# Patient Record
Sex: Female | Born: 1940 | Race: White | Hispanic: No | Marital: Single | Smoking: Former smoker
Health system: Southern US, Community
[De-identification: ages and names within clinical notes are randomized; demographics above are authoritative.]

## PROBLEM LIST (undated history)

## (undated) DIAGNOSIS — C562 Malignant neoplasm of left ovary: Secondary | ICD-10-CM

## (undated) DIAGNOSIS — E785 Hyperlipidemia, unspecified: Secondary | ICD-10-CM

## (undated) DIAGNOSIS — J9610 Chronic respiratory failure, unspecified whether with hypoxia or hypercapnia: Secondary | ICD-10-CM

## (undated) DIAGNOSIS — M810 Age-related osteoporosis without current pathological fracture: Secondary | ICD-10-CM

## (undated) DIAGNOSIS — D638 Anemia in other chronic diseases classified elsewhere: Secondary | ICD-10-CM

## (undated) DIAGNOSIS — I10 Essential (primary) hypertension: Secondary | ICD-10-CM

## (undated) DIAGNOSIS — J441 Chronic obstructive pulmonary disease with (acute) exacerbation: Secondary | ICD-10-CM

## (undated) DIAGNOSIS — J439 Emphysema, unspecified: Secondary | ICD-10-CM

## (undated) DIAGNOSIS — E119 Type 2 diabetes mellitus without complications: Secondary | ICD-10-CM

## (undated) DIAGNOSIS — Z5189 Encounter for other specified aftercare: Secondary | ICD-10-CM

## (undated) DIAGNOSIS — K56609 Unspecified intestinal obstruction, unspecified as to partial versus complete obstruction: Secondary | ICD-10-CM

## (undated) DIAGNOSIS — R6889 Other general symptoms and signs: Secondary | ICD-10-CM

## (undated) HISTORY — DX: Anemia in other chronic diseases classified elsewhere: D63.8

## (undated) HISTORY — DX: Type 2 diabetes mellitus without complications: E11.9

## (undated) HISTORY — DX: Encounter for other specified aftercare: Z51.89

## (undated) HISTORY — DX: Age-related osteoporosis without current pathological fracture: M81.0

## (undated) HISTORY — PX: ABDOMINAL HYSTERECTOMY: SHX81

## (undated) HISTORY — DX: Emphysema, unspecified: J43.9

## (undated) HISTORY — DX: Chronic obstructive pulmonary disease with (acute) exacerbation: J44.1

## (undated) HISTORY — DX: Other general symptoms and signs: R68.89

---

## 1993-05-16 ENCOUNTER — Encounter: Payer: Self-pay | Admitting: Pulmonary Disease

## 1998-04-13 ENCOUNTER — Other Ambulatory Visit: Admission: RE | Admit: 1998-04-13 | Discharge: 1998-04-13 | Payer: Self-pay | Admitting: Obstetrics and Gynecology

## 1998-04-13 ENCOUNTER — Ambulatory Visit (HOSPITAL_COMMUNITY): Admission: RE | Admit: 1998-04-13 | Discharge: 1998-04-13 | Payer: Self-pay | Admitting: Obstetrics and Gynecology

## 1999-04-11 ENCOUNTER — Encounter: Payer: Self-pay | Admitting: Obstetrics and Gynecology

## 1999-04-11 ENCOUNTER — Ambulatory Visit (HOSPITAL_COMMUNITY): Admission: RE | Admit: 1999-04-11 | Discharge: 1999-04-11 | Payer: Self-pay | Admitting: Obstetrics and Gynecology

## 1999-04-18 ENCOUNTER — Other Ambulatory Visit: Admission: RE | Admit: 1999-04-18 | Discharge: 1999-04-18 | Payer: Self-pay | Admitting: Obstetrics and Gynecology

## 2000-04-10 ENCOUNTER — Encounter: Payer: Self-pay | Admitting: Obstetrics and Gynecology

## 2000-04-10 ENCOUNTER — Ambulatory Visit (HOSPITAL_COMMUNITY): Admission: RE | Admit: 2000-04-10 | Discharge: 2000-04-10 | Payer: Self-pay | Admitting: Obstetrics and Gynecology

## 2000-04-18 ENCOUNTER — Other Ambulatory Visit: Admission: RE | Admit: 2000-04-18 | Discharge: 2000-04-18 | Payer: Self-pay | Admitting: Obstetrics and Gynecology

## 2000-04-23 ENCOUNTER — Encounter: Payer: Self-pay | Admitting: Family Medicine

## 2000-04-23 ENCOUNTER — Encounter: Admission: RE | Admit: 2000-04-23 | Discharge: 2000-04-23 | Payer: Self-pay | Admitting: Family Medicine

## 2000-05-15 ENCOUNTER — Encounter: Payer: Self-pay | Admitting: Pulmonary Disease

## 2000-06-14 ENCOUNTER — Encounter: Payer: Self-pay | Admitting: Pulmonary Disease

## 2000-09-24 ENCOUNTER — Ambulatory Visit (HOSPITAL_COMMUNITY): Admission: RE | Admit: 2000-09-24 | Discharge: 2000-09-24 | Payer: Self-pay | Admitting: Family Medicine

## 2000-11-09 ENCOUNTER — Encounter: Payer: Self-pay | Admitting: Emergency Medicine

## 2000-11-09 ENCOUNTER — Inpatient Hospital Stay (HOSPITAL_COMMUNITY): Admission: EM | Admit: 2000-11-09 | Discharge: 2000-11-12 | Payer: Self-pay | Admitting: Emergency Medicine

## 2000-11-10 ENCOUNTER — Encounter: Payer: Self-pay | Admitting: Surgery

## 2000-11-11 ENCOUNTER — Encounter: Payer: Self-pay | Admitting: Surgery

## 2000-11-12 ENCOUNTER — Encounter: Payer: Self-pay | Admitting: Surgery

## 2000-11-20 ENCOUNTER — Encounter: Payer: Self-pay | Admitting: Surgery

## 2000-11-20 ENCOUNTER — Encounter: Admission: RE | Admit: 2000-11-20 | Discharge: 2000-11-20 | Payer: Self-pay | Admitting: Surgery

## 2001-04-22 ENCOUNTER — Other Ambulatory Visit: Admission: RE | Admit: 2001-04-22 | Discharge: 2001-04-22 | Payer: Self-pay | Admitting: Obstetrics and Gynecology

## 2001-04-24 ENCOUNTER — Encounter: Admission: RE | Admit: 2001-04-24 | Discharge: 2001-04-24 | Payer: Self-pay | Admitting: Family Medicine

## 2001-04-24 ENCOUNTER — Encounter: Payer: Self-pay | Admitting: Family Medicine

## 2002-04-07 ENCOUNTER — Ambulatory Visit (HOSPITAL_COMMUNITY): Admission: RE | Admit: 2002-04-07 | Discharge: 2002-04-07 | Payer: Self-pay | Admitting: Obstetrics and Gynecology

## 2002-04-07 ENCOUNTER — Encounter: Payer: Self-pay | Admitting: Obstetrics and Gynecology

## 2002-04-29 ENCOUNTER — Encounter: Payer: Self-pay | Admitting: Family Medicine

## 2002-04-29 ENCOUNTER — Encounter: Admission: RE | Admit: 2002-04-29 | Discharge: 2002-04-29 | Payer: Self-pay | Admitting: Family Medicine

## 2003-05-01 ENCOUNTER — Encounter: Payer: Self-pay | Admitting: Obstetrics and Gynecology

## 2003-05-01 ENCOUNTER — Encounter: Admission: RE | Admit: 2003-05-01 | Discharge: 2003-05-01 | Payer: Self-pay | Admitting: Obstetrics and Gynecology

## 2004-01-02 ENCOUNTER — Inpatient Hospital Stay (HOSPITAL_COMMUNITY): Admission: EM | Admit: 2004-01-02 | Discharge: 2004-01-12 | Payer: Self-pay | Admitting: Emergency Medicine

## 2004-02-03 ENCOUNTER — Ambulatory Visit (HOSPITAL_COMMUNITY): Admission: RE | Admit: 2004-02-03 | Discharge: 2004-02-03 | Payer: Self-pay | Admitting: General Surgery

## 2004-04-19 ENCOUNTER — Ambulatory Visit (HOSPITAL_COMMUNITY): Admission: RE | Admit: 2004-04-19 | Discharge: 2004-04-19 | Payer: Self-pay | Admitting: General Surgery

## 2004-05-02 ENCOUNTER — Encounter: Admission: RE | Admit: 2004-05-02 | Discharge: 2004-05-02 | Payer: Self-pay | Admitting: Family Medicine

## 2004-06-02 ENCOUNTER — Ambulatory Visit (HOSPITAL_COMMUNITY): Admission: RE | Admit: 2004-06-02 | Discharge: 2004-06-02 | Payer: Self-pay | Admitting: *Deleted

## 2004-06-02 ENCOUNTER — Encounter (INDEPENDENT_AMBULATORY_CARE_PROVIDER_SITE_OTHER): Payer: Self-pay | Admitting: *Deleted

## 2004-11-05 ENCOUNTER — Inpatient Hospital Stay (HOSPITAL_COMMUNITY): Admission: EM | Admit: 2004-11-05 | Discharge: 2004-11-08 | Payer: Self-pay | Admitting: Emergency Medicine

## 2004-11-10 ENCOUNTER — Ambulatory Visit: Payer: Self-pay | Admitting: Pulmonary Disease

## 2004-11-22 ENCOUNTER — Ambulatory Visit: Payer: Self-pay | Admitting: Pulmonary Disease

## 2005-01-25 ENCOUNTER — Ambulatory Visit: Payer: Self-pay | Admitting: Pulmonary Disease

## 2005-05-03 ENCOUNTER — Ambulatory Visit: Payer: Self-pay | Admitting: Pulmonary Disease

## 2005-05-03 ENCOUNTER — Encounter: Admission: RE | Admit: 2005-05-03 | Discharge: 2005-05-03 | Payer: Self-pay | Admitting: Family Medicine

## 2005-08-26 ENCOUNTER — Emergency Department (HOSPITAL_COMMUNITY): Admission: EM | Admit: 2005-08-26 | Discharge: 2005-08-26 | Payer: Self-pay | Admitting: Emergency Medicine

## 2005-10-26 ENCOUNTER — Ambulatory Visit: Payer: Self-pay | Admitting: Pulmonary Disease

## 2006-02-22 ENCOUNTER — Emergency Department (HOSPITAL_COMMUNITY): Admission: EM | Admit: 2006-02-22 | Discharge: 2006-02-22 | Payer: Self-pay | Admitting: Emergency Medicine

## 2006-05-04 ENCOUNTER — Encounter: Admission: RE | Admit: 2006-05-04 | Discharge: 2006-05-04 | Payer: Self-pay | Admitting: Family Medicine

## 2006-05-11 ENCOUNTER — Ambulatory Visit: Payer: Self-pay | Admitting: Pulmonary Disease

## 2006-08-21 ENCOUNTER — Ambulatory Visit: Payer: Self-pay | Admitting: Pulmonary Disease

## 2006-11-19 ENCOUNTER — Ambulatory Visit: Payer: Self-pay | Admitting: Pulmonary Disease

## 2007-05-06 ENCOUNTER — Encounter: Admission: RE | Admit: 2007-05-06 | Discharge: 2007-05-06 | Payer: Self-pay | Admitting: Family Medicine

## 2007-06-03 ENCOUNTER — Ambulatory Visit: Payer: Self-pay | Admitting: Pulmonary Disease

## 2007-10-09 ENCOUNTER — Ambulatory Visit: Payer: Self-pay | Admitting: Pulmonary Disease

## 2007-12-09 ENCOUNTER — Ambulatory Visit: Payer: Self-pay | Admitting: Pulmonary Disease

## 2008-01-15 ENCOUNTER — Telehealth (INDEPENDENT_AMBULATORY_CARE_PROVIDER_SITE_OTHER): Payer: Self-pay | Admitting: *Deleted

## 2008-01-21 ENCOUNTER — Telehealth (INDEPENDENT_AMBULATORY_CARE_PROVIDER_SITE_OTHER): Payer: Self-pay | Admitting: *Deleted

## 2008-01-22 ENCOUNTER — Ambulatory Visit: Payer: Self-pay | Admitting: Pulmonary Disease

## 2008-01-22 DIAGNOSIS — J209 Acute bronchitis, unspecified: Secondary | ICD-10-CM | POA: Insufficient documentation

## 2008-05-06 ENCOUNTER — Encounter: Admission: RE | Admit: 2008-05-06 | Discharge: 2008-05-06 | Payer: Self-pay | Admitting: Family Medicine

## 2008-08-05 ENCOUNTER — Ambulatory Visit: Payer: Self-pay | Admitting: Pulmonary Disease

## 2008-08-10 ENCOUNTER — Telehealth (INDEPENDENT_AMBULATORY_CARE_PROVIDER_SITE_OTHER): Payer: Self-pay | Admitting: *Deleted

## 2008-08-11 ENCOUNTER — Inpatient Hospital Stay (HOSPITAL_COMMUNITY): Admission: EM | Admit: 2008-08-11 | Discharge: 2008-08-16 | Payer: Self-pay | Admitting: Emergency Medicine

## 2008-08-17 ENCOUNTER — Encounter: Payer: Self-pay | Admitting: Pulmonary Disease

## 2008-08-29 ENCOUNTER — Encounter: Payer: Self-pay | Admitting: Pulmonary Disease

## 2008-08-31 ENCOUNTER — Ambulatory Visit: Payer: Self-pay | Admitting: Pulmonary Disease

## 2008-09-03 ENCOUNTER — Telehealth: Payer: Self-pay | Admitting: Pulmonary Disease

## 2008-09-16 ENCOUNTER — Ambulatory Visit: Payer: Self-pay | Admitting: Pulmonary Disease

## 2008-10-02 ENCOUNTER — Telehealth (INDEPENDENT_AMBULATORY_CARE_PROVIDER_SITE_OTHER): Payer: Self-pay | Admitting: *Deleted

## 2008-11-02 ENCOUNTER — Ambulatory Visit: Payer: Self-pay | Admitting: Pulmonary Disease

## 2008-11-03 ENCOUNTER — Telehealth (INDEPENDENT_AMBULATORY_CARE_PROVIDER_SITE_OTHER): Payer: Self-pay | Admitting: *Deleted

## 2009-02-24 ENCOUNTER — Ambulatory Visit: Payer: Self-pay | Admitting: Pulmonary Disease

## 2009-08-11 ENCOUNTER — Ambulatory Visit: Payer: Self-pay | Admitting: Pulmonary Disease

## 2009-10-11 ENCOUNTER — Telehealth (INDEPENDENT_AMBULATORY_CARE_PROVIDER_SITE_OTHER): Payer: Self-pay | Admitting: *Deleted

## 2009-12-06 ENCOUNTER — Ambulatory Visit: Payer: Self-pay | Admitting: Pulmonary Disease

## 2009-12-22 ENCOUNTER — Encounter: Payer: Self-pay | Admitting: Pulmonary Disease

## 2010-02-09 ENCOUNTER — Ambulatory Visit: Payer: Self-pay | Admitting: Pulmonary Disease

## 2010-04-30 ENCOUNTER — Encounter: Payer: Self-pay | Admitting: Pulmonary Disease

## 2010-06-16 ENCOUNTER — Ambulatory Visit: Payer: Self-pay | Admitting: Pulmonary Disease

## 2010-06-16 DIAGNOSIS — J441 Chronic obstructive pulmonary disease with (acute) exacerbation: Secondary | ICD-10-CM

## 2010-06-18 ENCOUNTER — Encounter: Payer: Self-pay | Admitting: Pulmonary Disease

## 2010-06-24 ENCOUNTER — Encounter: Payer: Self-pay | Admitting: Pulmonary Disease

## 2010-08-10 ENCOUNTER — Ambulatory Visit: Payer: Self-pay | Admitting: Pulmonary Disease

## 2010-12-06 NOTE — Letter (Signed)
Summary: CMN/[facility]  CMN/[facility]   Imported By: Lester  05/05/2010 10:57:33  _____________________________________________________________________  External Attachment:    Type:   Image     Comment:   External Document

## 2010-12-06 NOTE — Assessment & Plan Note (Signed)
Summary: rov for emphysema   Primary Provider/Referring Provider:  Catha Gosselin  CC:  pt is here for a 6 month f/u appt.  pt states breathing is unchanged from last visit.  Pt denied any new complaints.  pt would like a rx for spiriva and symbicort - 30 day supply .  History of Present Illness: the pt comes in today for f/u of her known emphysema.  She has been doing well since last visit, with no recent exacerbation or pulmonary infection.  She is staying compliant with her meds, and has been very active.  Medications Prior to Update: 1)  Anti-Oxidant   Tabs (Multiple Vitamin) .... Once Daily 2)  Spiriva Handihaler 18 Mcg  Caps (Tiotropium Bromide Monohydrate) .... Once Daily 3)  Benicar 20 Mg Tabs (Olmesartan Medoxomil) .... Take 1 Tablet By Mouth Once A Day 4)  Lipitor 80 Mg  Tabs (Atorvastatin Calcium) .... Take 1 Tablet By Mouth Once A Day 5)  Caltrate 600 1500 Mg Tabs (Calcium Carbonate) .... Take 1 Tablet By Mouth Two Times A Day 6)  Icaps Areds Formula   Tabs (Multiple Vitamins-Minerals) .... Once Daily 7)  Adult Aspirin Low Strength 81 Mg  Tbdp (Aspirin) .... Once Daily 8)  Fish Oil 1000 Mg  Caps (Omega-3 Fatty Acids) .... Take 1 Tablet By Mouth Two Times A Day 9)  Proair Hfa 108 (90 Base) Mcg/act  Aers (Albuterol Sulfate) .Marland Kitchen.. 1-2 Puffs Every 4-6 Hours As Needed 10)  Xanax 0.25 Mg Tabs (Alprazolam) .... Take 1 Tab By Mouth As Needed 11)  Albuterol Sulfate (2.5 Mg/72ml) 0.083% Nebu (Albuterol Sulfate) .... Use 1 Vial in Hhn Every 4 To 6 Hours As Needed For Sob 12)  Symbicort 160-4.5 Mcg/act Aero (Budesonide-Formoterol Fumarate) .... 2 Puffs Two Times A Day  Allergies (verified): No Known Drug Allergies  Review of Systems      See HPI  Vital Signs:  Patient profile:   70 year old female Height:      61 inches Weight:      95. pounds BMI:     18.01 O2 Sat:      97 % on Room air Temp:     97.8 degrees F oral Pulse rate:   86 / minute BP sitting:   110 / 64  (right  arm) Cuff size:   regular  Vitals Entered By: Arman Filter LPN (February 09, 4781 1:39 PM)  O2 Flow:  Room air CC: pt is here for a 6 month f/u appt.  pt states breathing is unchanged from last visit.  Pt denied any new complaints.  pt would like a rx for spiriva and symbicort - 30 day supply  Comments Medications reviewed with patient  Arman Filter LPN  February 09, 9561 1:42 PM    Physical Exam  General:  thin female in nad Lungs:  decreased bs throughout, with no wheezing or rhonchi Heart:  rrr Extremities:  no edema noted.   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  She feels her breathing is at baseline, and has not had cough or congestion.  I have asked her to stay on her current regimen, and to stay as active as possible.  Other Orders: Est. Patient Level II (13086)  Patient Instructions: 1)  no change in meds. 2)  can take zyrtec 10mg  otc at bedtime to help with allergy symptoms. 3)  followup with me  in 6mos   Prescriptions: ALBUTEROL SULFATE (2.5 MG/3ML) 0.083% NEBU (ALBUTEROL SULFATE) use 1 vial in hhn every 4 to 6 hours as needed for sob  #one box x 6   Entered and Authorized by:   Barbaraann Share MD   Signed by:   Barbaraann Share MD on 02/09/2010   Method used:   Print then Give to Patient   RxID:   [id number] SYMBICORT 160-4.5 MCG/ACT AERO (BUDESONIDE-FORMOTEROL FUMARATE) 2 puffs two times a day  #1 x 6   Entered and Authorized by:   Barbaraann Share MD   Signed by:   Barbaraann Share MD on 02/09/2010   Method used:   Print then Give to Patient   RxID:   [id number] PROAIR HFA 108 (90 BASE) MCG/ACT  AERS (ALBUTEROL SULFATE) 1-2 puffs every 4-6 hours as needed  #1 x 6   Entered and Authorized by:   Barbaraann Share MD   Signed by:   Barbaraann Share MD on 02/09/2010   Method used:   Print then Give to Patient   RxID:   [id number] SPIRIVA HANDIHALER 18 MCG   CAPS (TIOTROPIUM BROMIDE MONOHYDRATE) once daily  #30 x 6   Entered and Authorized by:   Barbaraann Share MD   Signed by:   Barbaraann Share MD on 02/09/2010   Method used:   Print then Give to Patient   RxID:   [id number]

## 2010-12-06 NOTE — Letter (Signed)
Summary: CMN for Oxygen/[facility]  CMN for Oxygen/[facility]   Imported By: Sherian Rein 08/03/2010 11:59:45  _____________________________________________________________________  External Attachment:    Type:   Image     Comment:   External Document

## 2010-12-06 NOTE — Assessment & Plan Note (Signed)
Summary: f/u o2 re- cert per pt ///kp   Primary Provider/Referring Provider:  Catha Gosselin  CC:  Pt is here for a f/u appt.  Pt states [facility] informed her to make this appt to get re-cert on her o2.  Pt states no changes in breathing.  Pt denied any new complaints. .  History of Present Illness: no visit.  Pt scheduled [facility] by [facility] for oxgyen recert.  Medications Prior to Update: 1)  Anti-Oxidant   Tabs (Multiple Vitamin) .... Once Daily 2)  Spiriva Handihaler 18 Mcg  Caps (Tiotropium Bromide Monohydrate) .... Once Daily 3)  Benicar 20 Mg Tabs (Olmesartan Medoxomil) .... Take 1 Tablet By Mouth Once A Day 4)  Lipitor 80 Mg  Tabs (Atorvastatin Calcium) .... Take 1 Tablet By Mouth Once A Day 5)  Caltrate 600 1500 Mg Tabs (Calcium Carbonate) .... Take 1 Tablet By Mouth Two Times A Day 6)  Icaps Areds Formula   Tabs (Multiple Vitamins-Minerals) .... Once Daily 7)  Adult Aspirin Low Strength 81 Mg  Tbdp (Aspirin) .... Once Daily 8)  Fish Oil 1000 Mg  Caps (Omega-3 Fatty Acids) .... Take 1 Tablet By Mouth Two Times A Day 9)  Proair Hfa 108 (90 Base) Mcg/act  Aers (Albuterol Sulfate) .Marland Kitchen.. 1-2 Puffs Every 4-6 Hours As Needed 10)  Xanax 0.25 Mg Tabs (Alprazolam) .... Take 1 Tab By Mouth As Needed 11)  Albuterol Sulfate (2.5 Mg/2ml) 0.083% Nebu (Albuterol Sulfate) .... Use 1 Vial in Hhn Every 4 To 6 Hours As Needed For Sob 12)  Symbicort 160-4.5 Mcg/act Aero (Budesonide-Formoterol Fumarate) .... 2 Puffs Two Times A Day  Allergies (verified): No Known Drug Allergies  Vital Signs:  Patient profile:   70 year old female Height:      61 inches Weight:      96.13 pounds BMI:     18.23 O2 Sat:      81 % on Room air Temp:     97.8 degrees F oral Pulse rate:   101 / minute BP sitting:   122 / 70  (left arm) Cuff size:   regular  Vitals Entered By: Arman Filter LPN (December 06, 2009 10:18 AM)  O2 Flow:  Room air CC: Pt is here for a f/u appt.  Pt states [facility] informed her to make this appt  to get re-cert on her o2.  Pt states no changes in breathing.  Pt denied any new complaints.  Comments rechecked pt's o2 sats after pt rested for a few minutes.  O2 sats increased to 98% on RA.  HR=94.   Medications reviewed with patient Arman Filter LPN  December 06, 2009 10:18 AM     Other Orders: No Charge Patient Arrived (NCPA0) (NCPA0)  Appended Document: Orders Update    Clinical Lists Changes  Orders: Added new Referral order of DME Referral (DME) - Signed

## 2010-12-06 NOTE — Letter (Signed)
Summary: CMN/[facility]  CMN/[facility]   Imported By: Lester [place] 06/27/2010 08:55:03  _____________________________________________________________________  External Attachment:    Type:   Image     Comment:   External Document

## 2010-12-06 NOTE — Assessment & Plan Note (Signed)
Summary: acute sick visit for copd exacerbation.   Primary Provider/Referring Provider:  Catha Gosselin  CC:  Pt is here for a sick. Pt c/o hoarseness and tightness in chest x 3 days.  Pt denied a cough.  Pt does c/o sob while talking and with exertion.  Marland Kitchen  History of Present Illness: the pt comes in today for an acute sick visit.  She has known severe emphysema, but has done fairly well given the severity of her disease.  She notes increasing chest congestion and tightness over the past 3 days, along with increased sob with exertion and conversation.  She denies purulent mucus, but does have a mild cough that she downplays.  Medications Prior to Update: 1)  Anti-Oxidant   Tabs (Multiple Vitamin) .... Once Daily 2)  Spiriva Handihaler 18 Mcg  Caps (Tiotropium Bromide Monohydrate) .... Once Daily 3)  Benicar 20 Mg Tabs (Olmesartan Medoxomil) .... Take 1 Tablet By Mouth Once A Day 4)  Lipitor 80 Mg  Tabs (Atorvastatin Calcium) .... Take 1 Tablet By Mouth Once A Day 5)  Caltrate 600 1500 Mg Tabs (Calcium Carbonate) .... Take 1 Tablet By Mouth Two Times A Day 6)  Icaps Areds Formula   Tabs (Multiple Vitamins-Minerals) .... Once Daily 7)  Adult Aspirin Low Strength 81 Mg  Tbdp (Aspirin) .... Once Daily 8)  Fish Oil 1000 Mg  Caps (Omega-3 Fatty Acids) .... Take 1 Tablet By Mouth Two Times A Day 9)  Proair Hfa 108 (90 Base) Mcg/act  Aers (Albuterol Sulfate) .Marland Kitchen.. 1-2 Puffs Every 4-6 Hours As Needed 10)  Xanax 0.25 Mg Tabs (Alprazolam) .... Take 1 Tab By Mouth As Needed 11)  Albuterol Sulfate (2.5 Mg/25ml) 0.083% Nebu (Albuterol Sulfate) .... Use 1 Vial in Hhn Every 4 To 6 Hours As Needed For Sob 12)  Symbicort 160-4.5 Mcg/act Aero (Budesonide-Formoterol Fumarate) .... 2 Puffs Two Times A Day  Current Medications (verified): 1)  Anti-Oxidant   Tabs (Multiple Vitamin) .... Once Daily 2)  Spiriva Handihaler 18 Mcg  Caps (Tiotropium Bromide Monohydrate) .... Once Daily 3)  Benicar 20 Mg Tabs (Olmesartan  Medoxomil) .... Take 1 Tablet By Mouth Once A Day 4)  Lipitor 80 Mg  Tabs (Atorvastatin Calcium) .... Take 1 Tablet By Mouth Once A Day 5)  Caltrate 600 1500 Mg Tabs (Calcium Carbonate) .... Take 1 Tablet By Mouth Two Times A Day 6)  Icaps Areds Formula   Tabs (Multiple Vitamins-Minerals) .... Once Daily 7)  Adult Aspirin Low Strength 81 Mg  Tbdp (Aspirin) .... Once Daily 8)  Fish Oil 1000 Mg  Caps (Omega-3 Fatty Acids) .... Take 1 Tablet By Mouth Two Times A Day 9)  Proair Hfa 108 (90 Base) Mcg/act  Aers (Albuterol Sulfate) .Marland Kitchen.. 1-2 Puffs Every 4-6 Hours As Needed 10)  Xanax 0.25 Mg Tabs (Alprazolam) .... Take 1 Tab By Mouth As Needed 11)  Albuterol Sulfate (2.5 Mg/47ml) 0.083% Nebu (Albuterol Sulfate) .... Use 1 Vial in Hhn Every 4 To 6 Hours As Needed For Sob 12)  Symbicort 160-4.5 Mcg/act Aero (Budesonide-Formoterol Fumarate) .... 2 Puffs Two Times A Day  Allergies (verified): No Known Drug Allergies  Past History:  Past medical, surgical, family and social histories (including risk factors) reviewed, and no changes noted (except as noted below).  Past Medical History: Reviewed history from 01/22/2008 and no changes required. Current Problems:  EMPHYSEMA (ICD-492.8)    Family History: Reviewed history and no changes required.  Social History: Reviewed history and no changes required.  Review of Systems       The patient complains of shortness of breath with activity, shortness of breath at rest, and nasal congestion/difficulty breathing through nose.  The patient denies productive cough, non-productive cough, coughing up blood, chest pain, irregular heartbeats, acid heartburn, indigestion, loss of appetite, weight change, abdominal pain, difficulty swallowing, sore throat, tooth/dental problems, headaches, sneezing, itching, ear ache, anxiety, depression, hand/feet swelling, joint stiffness or pain, rash, change in color of mucus, and fever.    Vital Signs:  Patient profile:    70 year old female Height:      61 inches Weight:      94 pounds BMI:     17.83 O2 Sat:      95 % on Room air Temp:     98.1 degrees F oral Pulse rate:   97 / minute BP sitting:   136 / 84  (left arm) Cuff size:   regular  Vitals Entered By: Arman Filter LPN (June 16, 2010 3:38 PM)  O2 Flow:  Room air CC: Pt is here for a sick. Pt c/o hoarseness and tightness in chest x 3 days.  Pt denied a cough.  Pt does c/o sob while talking and with exertion.   Comments Medications reviewed with patient Arman Filter LPN  June 16, 2010 3:38 PM    Physical Exam  General:  thin female in nad Eyes:  PERRLA and EOMI.   Nose:  patent without discharge.  no purulence Mouth:  clear Lungs:  very decreased bs, no wheezing or rhonchi Heart:  rrr, no mrg Extremities:  no edema or cyanosis   Neurologic:  alert and oriented, moves all 4.   Impression & Recommendations:  Problem # 1:  CHRONIC OBSTRUCTIVE PULMONARY DISEASE, ACUTE EXACERBATION (ICD-491.21) the pt is describing what sounds like a developing acute copd exacerbation.  She has very little lung reserve, and is at increased risk for decompensation and hospitalization.  I would like to treat her with a course of prednisone, and also emperic abx knowing her history and high risk.  She can also use her nebulizer treatments for "bad days".  She is to call if not improving.  Medications Added to Medication List This Visit: 1)  Prednisone 10 Mg Tabs (Prednisone) .... Take 4 each day for 2 days, then 3 each day for 2 days, then 2 each day for 2 days, then 1 each day for 2 days, then stop 2)  Cefdinir 300 Mg Caps (Cefdinir) .... 2 by mouth each am for 5 days  Other Orders: Est. Patient Level IV (91478)  Patient Instructions: 1)  will start a short course of prednisone as directed 2)  will give you a prescription for an antibiotic...do not fill unless you become more congested and cough up discolored mucus. 3)  can try chlorpheniramine 8mg   over the counter at bedtime for postnasal drip   Prescriptions: CEFDINIR 300 MG CAPS (CEFDINIR) 2 by mouth each am for 5 days  #10 x 0   Entered and Authorized by:   Barbaraann Share MD   Signed by:   Barbaraann Share MD on 06/16/2010   Method used:   Print then Give to Patient   RxID:   [id number] PREDNISONE 10 MG  TABS (PREDNISONE) take 4 each day for 2 days, then 3 each day for 2 days, then 2 each day for 2 days, then 1 each day for 2 days, then stop  #20 x 0   Entered and Authorized  by:   Barbaraann Share MD   Signed by:   Barbaraann Share MD on 06/16/2010   Method used:   Print then Give to Patient   RxID:   [id number]

## 2010-12-06 NOTE — Letter (Signed)
Summary: CMN for Nebulizer/[facility]  CMN for Nebulizer/[facility]   Imported By: Sherian Rein 12/28/2009 08:35:53  _____________________________________________________________________  External Attachment:    Type:   Image     Comment:   External Document

## 2010-12-06 NOTE — Assessment & Plan Note (Signed)
Summary: rov for emphysema   Visit Type:  Follow-up Primary Provider/Referring Provider:  Catha Gosselin  CC:  6 month follow up. Pt states her breathing has been "fine. pt states she has no problems or concerns today. Marland Kitchen  History of Present Illness: the pt comes in today for f/u of her known emphysema.  She is doing well on her current meds, and has had no recent pulmonary infection.  She feels her exertional tolerance is stable, and is trying to stay active.  She denies any chest congestion, cough, or purulence.  Current Medications (verified): 1)  Anti-Oxidant   Tabs (Multiple Vitamin) .... Once Daily 2)  Spiriva Handihaler 18 Mcg  Caps (Tiotropium Bromide Monohydrate) .... Once Daily 3)  Benicar 20 Mg Tabs (Olmesartan Medoxomil) .... Take 1 Tablet By Mouth Once A Day 4)  Lipitor 80 Mg  Tabs (Atorvastatin Calcium) .... Take 1 Tablet By Mouth Once A Day 5)  Caltrate 600 1500 Mg Tabs (Calcium Carbonate) .... Take 1 Tablet By Mouth Two Times A Day 6)  Icaps Areds Formula   Tabs (Multiple Vitamins-Minerals) .... Once Daily 7)  Adult Aspirin Low Strength 81 Mg  Tbdp (Aspirin) .... Once Daily 8)  Fish Oil 1000 Mg  Caps (Omega-3 Fatty Acids) .... Take 1 Tablet By Mouth Two Times A Day 9)  Proair Hfa 108 (90 Base) Mcg/act  Aers (Albuterol Sulfate) .Marland Kitchen.. 1-2 Puffs Every 4-6 Hours As Needed 10)  Xanax 0.25 Mg Tabs (Alprazolam) .... Take 1 Tab By Mouth As Needed 11)  Albuterol Sulfate (2.5 Mg/38ml) 0.083% Nebu (Albuterol Sulfate) .... Use 1 Vial in Hhn Every 4 To 6 Hours As Needed For Sob 12)  Symbicort 160-4.5 Mcg/act Aero (Budesonide-Formoterol Fumarate) .... 2 Puffs Two Times A Day 13)  Actonel 150 Mg Tabs (Risedronate Sodium) .... One Tablet Once A Month  Allergies (verified): 1)  ! Percocet  Review of Systems       The patient complains of shortness of breath with activity and sneezing.  The patient denies shortness of breath at rest, productive cough, non-productive cough, coughing up blood,  chest pain, irregular heartbeats, acid heartburn, indigestion, loss of appetite, weight change, abdominal pain, difficulty swallowing, sore throat, tooth/dental problems, headaches, nasal congestion/difficulty breathing through nose, itching, ear ache, anxiety, depression, hand/feet swelling, joint stiffness or pain, rash, change in color of mucus, and fever.    Vital Signs:  Patient profile:   70 year old female Height:      61 inches Weight:      94.25 pounds BMI:     17.87 O2 Sat:      95 % on Room air Temp:     98.2 degrees F oral Pulse rate:   92 / minute BP sitting:   100 / 62  (left arm) Cuff size:   regular  Vitals Entered By: Carver Fila (August 10, 2010 1:59 PM)  O2 Flow:  Room air CC: 6 month follow up. Pt states her breathing has been "fine. pt states she has no problems or concerns today.  Comments meds and allergies updated Phone number updated Mindy Silva  August 10, 2010 2:00 PM    Physical Exam  General:  thin female in nad Lungs:  very decreased bs throughout, no wheezing or rhonchi Heart:  rrr, no mrg Extremities:  no edema or cyanosis Neurologic:  alert and oriented,moves all 4.   Impression & Recommendations:  Problem # 1:  EMPHYSEMA (ICD-492.8) the pt is doing well wrt her  emphysema.  She has not had a recent chest infection, and feels that her QOL is reasonable.  She is compliant with her meds, and is staying on oxygen as directed.  I have asked her to keep working on conditioning, and to call if having issues.  I will see her back in 6mos.  Medications Added to Medication List This Visit: 1)  Actonel 150 Mg Tabs (Risedronate sodium) .... One tablet once a month  Other Orders: Est. Patient Level III (36644)  Patient Instructions: 1)  no change in meds. 2)  stay as active as you can  3)  followup with me in 6mos.   Prescriptions: SYMBICORT 160-4.5 MCG/ACT AERO (BUDESONIDE-FORMOTEROL FUMARATE) 2 puffs two times a day  #1 x 6   Entered and  Authorized by:   Barbaraann Share MD   Signed by:   Barbaraann Share MD on 08/10/2010   Method used:   Print then Give to Patient   RxID:   [id number] SPIRIVA HANDIHALER 18 MCG  CAPS (TIOTROPIUM BROMIDE MONOHYDRATE) once daily  #30 x 6   Entered and Authorized by:   Barbaraann Share MD   Signed by:   Barbaraann Share MD on 08/10/2010   Method used:   Print then Give to Patient   RxID:   [id number]    Immunization History:  Influenza Immunization History:    Influenza:  historical (07/26/2010)

## 2011-02-07 ENCOUNTER — Encounter: Payer: Self-pay | Admitting: Pulmonary Disease

## 2011-02-08 ENCOUNTER — Ambulatory Visit: Payer: Self-pay | Admitting: Pulmonary Disease

## 2011-02-09 ENCOUNTER — Ambulatory Visit (INDEPENDENT_AMBULATORY_CARE_PROVIDER_SITE_OTHER): Payer: Medicare Other | Admitting: Pulmonary Disease

## 2011-02-09 ENCOUNTER — Encounter: Payer: Self-pay | Admitting: Pulmonary Disease

## 2011-02-09 VITALS — BP 118/68 | HR 90 | Temp 98.3°F | Ht 61.5 in | Wt 97.0 lb

## 2011-02-09 DIAGNOSIS — J438 Other emphysema: Secondary | ICD-10-CM

## 2011-02-09 MED ORDER — ALBUTEROL SULFATE HFA 108 (90 BASE) MCG/ACT IN AERS: 2.0000 | INHALATION_SPRAY | Freq: Four times a day (QID) | RESPIRATORY_TRACT | Status: DC | PRN

## 2011-02-09 MED ORDER — ALBUTEROL SULFATE (2.5 MG/3ML) 0.083% IN NEBU: 2.5000 mg | INHALATION_SOLUTION | Freq: Four times a day (QID) | RESPIRATORY_TRACT | Status: DC | PRN

## 2011-02-09 MED ORDER — TIOTROPIUM BROMIDE MONOHYDRATE 18 MCG IN CAPS: 18.0000 ug | ORAL_CAPSULE | Freq: Every day | RESPIRATORY_TRACT | Status: DC

## 2011-02-09 MED ORDER — BUDESONIDE-FORMOTEROL FUMARATE 160-4.5 MCG/ACT IN AERO: 2.0000 | INHALATION_SPRAY | Freq: Two times a day (BID) | RESPIRATORY_TRACT | Status: DC

## 2011-02-09 NOTE — Patient Instructions (Signed)
Stay on current breathing meds. Stay as active as possible Try chlorpheniramine 8mg  at bedtime or zyrtec 10mg  during day for postnasal drip followup with me in 6mos, but call if having issues

## 2011-02-09 NOTE — Progress Notes (Signed)
  Subjective:    Patient ID: [name], female    DOB: 1941-08-09, 70 y.o.   MRN: [id number]  HPI The pt comes in today for f/u of her known severe emphysema.  She is maintaining on her bronchodilator regimen and oxygen, and feels she is at her usual baseline.  She has not had any recent pulmonary infection or acute exacerbation.  She denies cough, congestion, or purulence    Review of Systems  Constitutional: Negative for appetite change and unexpected weight change.  HENT: Positive for rhinorrhea and sneezing. Negative for ear pain, congestion, sore throat, trouble swallowing and dental problem.   Respiratory: Positive for cough. Negative for shortness of breath and wheezing.   Cardiovascular: Negative for chest pain, palpitations and leg swelling.  Gastrointestinal: Negative for nausea, abdominal pain and diarrhea.  Musculoskeletal: Negative for joint swelling.  Skin: Negative for rash.  Neurological: Negative for syncope and headaches.  Hematological: Bruises/bleeds easily.  Psychiatric/Behavioral: Negative for dysphoric mood. The patient is not nervous/anxious.        Objective:   Physical Exam Thin female in nad  Chest with decreased bs, but no wheezing or rhonchi Cor with rrr, no mrg LE with no edema or cyanosis  Alert and oriented, moves all 4        Assessment & Plan:

## 2011-02-11 ENCOUNTER — Encounter: Payer: Self-pay | Admitting: Pulmonary Disease

## 2011-02-11 NOTE — Assessment & Plan Note (Signed)
The pt is maintaining a stable baseline on her current regimen, and has not had a recent acute exacerbation.  I have asked her to continue with her meds, and to work aggressively on some type of exercise program.  She will followup with me in 6mos.

## 2011-03-21 NOTE — Discharge Summary (Signed)
NAMEMARLY, SCHULD NO.:  [id number]   MEDICAL RECORD NO.:  [id number]          PATIENT TYPE:  INP   LOCATION:  1410                         FACILITY:  [facility]   PHYSICIAN:  Ramiro Harvest, MD    DATE OF BIRTH:  Sep 05, 1941   DATE OF ADMISSION:  08/10/2008  DATE OF DISCHARGE:  08/16/2008                               DISCHARGE SUMMARY   ATTENDING PHYSICIAN:  Ramiro Harvest, MD   PRIMARY CARE PHYSICIAN:  Anna Genre. Little, MD, of [facility].   PULMONOLOGIST:  Barbaraann Share, MD,FCCP of [facility].   DISCHARGE DIAGNOSES:  1. COPD (chronic obstructive pulmonary disease) exacerbation.  2. Hypertension.  3. Hyperkalemia.  4. Hyperlipidemia.  5. Anxiety.   DISCHARGE MEDICATIONS:  1. Azithromycin 250 mg p.o. daily x2 days.  2. Advair 100/50 one puff b.i.d.  3. Prednisone 40 mg p.o. daily x2 days then 20 mg p.o. daily x2 days      then stop.  4. Home oxygen 2 liters per minute.  5. Albuterol inhaler 3 times daily x3 days and then as needed.  6. Lipitor 80 mg p.o. daily.  7. Lisinopril 40 mg p.o. daily.  8. Spiriva 18 mcg one puff daily.  9. Xanax 0.25 mg b.i.d.  10.Fosamax one tab p.o. daily.  11.Aspirin 81 mg p.o. daily.  12.Calcium 600 mg b.i.d.  13.Fish oil 1000 mg b.i.d.  14.Multivitamin one tablet daily.   DISPOSITION AND FOLLOWUP:  The patient will be discharged home.  The  patient is to schedule a followup appointment with her PCP, Dr. Clarene Duke,  and her pulmonologist, Dr. Shelle Iron, in 1-2 weeks to reassess the  patient's COPD and oxygen status.  The patient was sent home on home  oxygen.  This may be reassessed to see whether the patient needs to be  on continuous home O2.  A basic metabolic profile needs to be checked to  follow up on the patient's electrolytes, as well the patient's blood  pressure and other chronic medical issues will need to be reassessed per  PCP.   HOSPITAL CONSULTATIONS:  None.   PROCEDURES PERFORMED:  A  chest x-ray was performed on August 10, 2008,  that showed COPD/emphysema, no acute disease, stable compared to prior  examination.   BRIEF ADMISSION HISTORY AND PHYSICAL:  Ms. [name] is a 70 year old  female with a history of COPD and hypertension who for the past few days  prior to admission was having worsening shortness of breath and  wheezing.  The patient got a hold of her pulmonologist who called in  some prednisone.  The patient took 2 tablets without any relief at which  point she presented to the emergency department where she received some  albuterol and Atrovent nebulizers and 125 mg of Solu-Medrol with some  improvement in her breathing.  The patient still had oxygen requirement  of 2 liters at which point the [facility] hospitalist was called to admit the  patient.  The patient denied any fevers, no chills, no cough.  The  patient had not been producing any sputum.  The patient had  not had any  upper respiratory infection symptoms.  No myalgias.  No fatigue.  Otherwise unremarkable.   PHYSICAL EXAM PER ADMITTING PHYSICIAN:  VITAL SIGNS:  Temperature 97.7,  blood pressure 131/80, pulse of 109, respiratory rate 18, satting 97% on  2 liters and 88% on room air.  GENERAL:  The patient is an elderly female in no acute distress sitting  on the stretcher.  HEENT:  Normocephalic, atraumatic.  Pupils equal, round and reactive to  light.  Extraocular movements intact.  Somewhat dry mucous membranes.  Decreased skin turgor.  NECK:  Supple.  No lymphadenopathy.  Cranial nerves II-XII are grossly  intact.  LUNGS:  Distant breath sounds with bilateral wheezes, decreased air  movement bilaterally.  HEART:  Regular rate and rhythm and tachycardic, no murmurs.  ABDOMEN:  Abdomen was slightly distended, soft, nontender, positive  bowel sounds.  LOWER EXTREMITIES:  No clubbing, cyanosis or edema.   LABS:  Were not obtained.  Chest x-ray did show COPD with no infiltrate.  EKG was not  obtained.   HOSPITAL COURSE:  1. Chronic obstructive pulmonary disease exacerbation.  The patient      was admitted onto the tele floor.  The patient was placed on IV      Solu-Medrol and albuterol and Atrovent nebulizer treatments.  The      patient's Spiriva was held during the hospitalization.  The patient      was placed on Advair and initially started on doxycycline.      Doxycycline was then changed to IV Avelox and the patient was      initially changed from IV Solu-Medrol to prednisone.  The patient      did still have some poor air movement and increased wheezing and      her prednisone was changed back to IV Solu-Medrol.  The patient was      continued on IV Solu-Medrol with a slow taper and maintained on      nebulizer treatments and oxygen.  The patient's Avelox got      infiltrated and as such the patient's Avelox was discontinued.  The      patient was then placed on azithromycin and Rocephin.  The patient      was continued on azithromycin and Rocephin throughout the      hospitalization with improvement in symptoms.  The patient's IV      Solu-Medrol was slowly tapered down and the patient was placed on      prednisone 60 mg p.o. daily.  The patient did have a 5 day      treatment of antibiotics in house and the patient will be      discharged home on 2 more days of azithromycin.  The patient will      be continued on her Spiriva.  Advair has been added to her regimen      and she will be given a prednisone taper as well as home oxygen on      discharge.  The patient on ambulation did have oxygen saturations      of 84 and 86% on ambulation on room air and as such qualified for      home O2.  The patient will be discharged in stable and improved      condition as the patient continued to improve throughout the      hospitalization such that by day of discharge the patient had      minimal wheezing and improvement  in her air movement, although the      patient's baseline  air movement is poor.  The patient will be      discharged in a stable and improved condition to follow up with PCP      and pulmonologist.  The patient's oxygen requirement will need to      be reassessed to see whether the patient needs to be continued on      her home O2.  2. Hyperkalemia.  During the hospitalization the patient was noted to      be hyperkalemic.  The patient was given some Kayexalate which      resolved the hyperkalemia.  However, the patient got a little bit      hypokalemic and on the day of discharge the patient's potassium was      3.1.  The patient's potassium was supplemented prior to discharge      and this will need to be followed up as an outpatient.  3. Hypertension was stable throughout the hospitalization.  The      patient was maintained on the home regimen of lisinopril.   The rest of the patient's chronic medical issues were stable throughout  the hospitalization and by day of discharge the patient was in stable  and improved condition.   Vital signs on day of discharge temperature 97.6, blood pressure 138/77,  pulse of 65, respiratory rate 20, satting 100% on 2 liters nasal cannula  and 86% on ambulation on room air.  Labs on discharge - sodium 142,  potassium 3.1, chloride 99, bicarb 36, BUN 15, creatinine 0.81, glucose  of 107, calcium of 9.2.  CBC - white count 7.5, hemoglobin 11.8,  platelets of 194, hematocrit of 34.9.  The patient's potassium was  supplemented prior to discharge.  This will need to be followed up as an  outpatient.  It was a pleasure taking care of Ms. [name].      Ramiro Harvest, MD  Electronically Signed     DT/MEDQ  D:  08/16/2008  T:  08/17/2008  Job:  [id number]   cc:   Caryn Bee L. Little, M.D.  Fax: [phone]   Barbaraann Share, MD,FCCP  [address]  [place]  [place] [address]

## 2011-03-21 NOTE — H&P (Signed)
[name], [name]                ACCOUNT NO.:  [id number]   MEDICAL RECORD NO.:  [id number]          PATIENT TYPE:  INP   LOCATION:  1410                         [facility]ACILITY:  [facility]   PHYSICIAN:  Michiel Cowboy, MDDATE OF BIRTH:  September 27, 1941   DATE OF ADMISSION:  08/10/2008  DATE OF DISCHARGE:                              HISTORY & PHYSICAL   PRIMARY CARE PHYSICIAN:  Dr. Clarene Duke.   CHIEF COMPLAINT:  Shortness of breath.   This is a 70 year old female with history of COPD and hypertension.  For  the past few days, she had been having worsening shortness of breath and  wheezing.  She got a hold of her pulmonologist who called in some  prednisone.  She took 2 tablets without any relief at which point she  presented to the emergency department where she received albuterol and  Atrovent nebulizers and Solu-Medrol 125 mg with some improvement in  breathing, but she still had an oxygen requirement of 2 liters at which  point [facility] was called to admit.   The patient denies any fevers, chills or cough.  Had not been producing  any sputum.  She has not had any upper URI-type symptoms lately.  No  myalgias no fatigue.  Otherwise unremarkable.   Twelve review of systems are reviewed, and there are as per HPI.   PAST MEDICAL HISTORY:  Significant for anxiety, COPD and hypertension.   SOCIAL HISTORY:  The patient used to smoke a pack and half a day for 30  years, quit 15 years ago.  Does not smoke currently.  Does not use  drugs.  Lives at home.   FAMILY HISTORY:  Noncontributory.   ALLERGIES:  TO PERCOCET.   MEDICATIONS:  1. Xanax 0.25 twice a day as needed.  2. Lisinopril 40 mg once a day.  3. Lipitor 80 mg once a day.  4. Prednisone recently started.  5. Spiriva once a day/  6. Albuterol as needed.   VITALS:  Temperature 97.7, blood pressure 131/80, pulse 109,  respirations 18.  Saturating 97% on 2 liters and 88% on room air.   PHYSICAL EXAMINATION:  The patient  is an elderly female in no acute  distress sitting down on a stretcher.  HEAD:  Nontraumatic.  Somewhat dry mucous membranes and decreased skin  turgor.  NECK:  Supple.  No lymphadenopathy noted.  Cranial nerves II-  XII intact.  LUNGS:  Distant breath sounds with bilateral wheezes.  Decreased air  movement bilaterally.  HEART:  Regular rate and rhythm but rapid.  No murmurs could be  appreciated.  ABDOMEN:  Slightly distended but soft, nontender.  Lower  extremities without clubbing, cyanosis or edema.   LABORATORY DATA:  Not obtained.  Chest x-ray showing COPD but no  infiltrate.  EKG not obtained.   ASSESSMENT/PLAN:  This is a 70 year old female with history of COPD who  presents with what seems to be COPD exacerbation.   1. Chronic obstructive pulmonary disease exacerbation.  Will continue      with Solu-Medrol IV, frequent nebulizers p.r.n. albuterol.  Will  schedule Atrovent and hold on Spiriva until feels better and able      to take good inhaler.  Will continue until then with nebulizers.      Will give Advair and doxycycline for chronic obstructive pulmonary      disease exacerbation.  2. Hypertension.  Continue lisinopril.  3. Hyperlipidemia.  Continue Lipitor.  4. Prophylaxis.  Protonix, Lovenox.  5. Shortness of breath.  Given that studies have shown that some      patients presenting with COPD-like symptoms actually end up having      PE, will obtain D-dimer although probability so far is fairly low.      Michiel Cowboy, MD  Electronically Signed     AVD/MEDQ  D:  08/11/2008  T:  08/11/2008  Job:  [id number]   cc:   Caryn Bee L. Little, M.D.  Fax: [phone]

## 2011-03-24 NOTE — Discharge Summary (Signed)
[facility]  Patient:    [name], [name]                       MRN: [id number] Adm. Date:  [id number] Disc. Date: 11/12/00 Attending:  Cleatrice Burke Dictator:   Loura Pardon, P.A. CC:         Barbaraann Share, M.D. [facility]  Kevin L. Little, M.D.   Discharge Summary  DATE OF BIRTH:  10/07/1941  CONSULTS: 1. Pulmonologist:  Barbaraann Share, M.D. [facility] 2. Primary Care Giver:  Anna Genre. Little, M.D.  FINAL DIAGNOSIS: Left pneumothorax after fall November 09, 2000.  SECONDARY DIAGNOSES: 1. Chronic obstructive pulmonary disease. 2. History of tobacco habituation having quit six years ago. 3. Hypertension. 4. Status post hysterectomy.  PROCEDURE:  On November 09, 2000, placement of left thoracostomy tube, Dr. Evelene Croon.  The patient tolerated the procedure well.  DISCHARGE DISPOSITION:  [name] is ready for discharge January 7, her fourth hospital day.  She underwent left thoracostomy tube placement for left pneumothorax, about 50%, after a fall on January 4.  On January 5, the chest tube was placed to water seal.  Chest x-ray on January 6 showed resolution of the left pneumothorax and the chest tube was removed.  Follow-up chest x-rays showed complete resolution of the left pneumothorax with subsequent discharge January 7.  Her pain is well controlled with oral analgesia.  She did have some nausea after taking Percocet.  She then complained of a headache on hospital day #3 for which she was given Tylenol.  It was not felt that her fall was traumatic enough to produce a headache especially one that arrived greater than 72 hours after the initial insult.  DISCHARGE MEDICATIONS:  She goes home on the following medications, 1. Percocet 5/325 mg 1-2 tabs q 4-6h p.r.n. pain. 2. Zestril 20 mg daily. 3. Premarin 0.625 mg daily. 4. Serevent meter dose inhaler 1 puff b.i.d.  DISCHARGE ACTIVITY:  Ambulation as tolerated.  DIET:  No  restrictions.  WOUND CARE:  She may shower daily keeping her incisions clean and dry.  FOLLOW-UP:  She has an office visit scheduled with Dr. Laneta Simmers Tuesday, November 20, 2000 in the afternoon.  A chest x-ray will be taken at [facility] one hour before the visit with Dr. Laneta Simmers.  Chest tube sutures will be taken out also on Tuesday, November 20, 2000.  She has an appointment with Dr. Shelle Iron on the morning, November 20, 2000 which she will keep.  BRIEF HISTORY:  Ms. [name] is a 70 year old female with a history of COPD.  She is followed by Dr. Shelle Iron for this.  She fell on ice on January 4 landing on her chest and shoulder.  She did not lose consciousness but she did develop sudden shortness of breath, pain in the left chest.  She went to the emergency room at [facility] where a chest x-ray showed a 50% left pneumothorax as well as changes consistent with chronic obstructive pulmonary disease in both lungs.  Dr. Laneta Simmers was consulted and placed a left thoracostomy tube.  The hospital course is as described in the discharge disposition.  Ms. [name] is improved.  Her respiratory function has not been compromised at any time.  A chest x-ray followed up after chest tube was removed showed resolution of left pneumothorax. DD:  11/11/00 TD:  11/11/00 Job: [id number]

## 2011-03-24 NOTE — Discharge Summary (Signed)
NAMEJACQUALYNN, [name]                ACCOUNT NO.:  [id number]   MEDICAL RECORD NO.:  [id number]          PATIENT TYPE:  INP   LOCATION:  0379                         FACILITY:  [facility]   PHYSICIAN:  Jessica L. Ladona Ridgel, MD  DATE OF BIRTH:  09-02-1941   DATE OF ADMISSION:  11/05/2004  DATE OF DISCHARGE:  11/08/2004                                 DISCHARGE SUMMARY   DISCHARGE DIAGNOSES:  1.  Right lower lobe pneumonia superimposed on chronic obstructive pulmonary      disease exacerbation.  The patient was discharged to home on a      prednisone taper and home oxygen.  2.  History of duodenal diverticula.  During this hospitalization she had no      gastrointestinal complications.  3.  Hypoxia related to #1.  As stated, the patient will go home on home      oxygen and be followed up by her primary care physician, Dr. Shelle Miranda.   DISCHARGE MEDICATIONS:  1.  Avelox 400 mg once daily.  2.  Prednisone 40 mg q.12h. x2 days Wednesday and Thursday, then 30 mg x2      days, then 30 mg once a day for two days, then 20 mg once a day for two      days, then 10 mg once a day for two days, then stop.  3.  Tussionex 5 mL q.12h.  4.  Home oxygen at 2 L.  5.  Lisinopril 20 mg daily.  6.  Lipitor 40 mg daily.  7.  Fosamax 70 mg q.weekly.  8.  Spiriva one capsule inhaled daily which was to be resumed after her      prednisone taper.   HISTORY OF PRESENT ILLNESS:  Patient is a pleasant 70 year old white female  with a known history of COPD followed by Dr. Marcelyn Miranda as an outpatient.  She presented on November 05, 2004 with two days of shortness of breath,  cough with no sputum production, and frontal sinus pain.  She was admitted  to the hospital after being found to have a right lower lobe pneumonia.  She  was admitted to telemetry for initial tachycardia.  The telemetry was  discontinued after she was noted to have 24 hours of normal sinus without  sinus tachycardia.  She responded favorably to  steroids and oral  antibiotics, Avelox 400 mg daily.  She continued to have hypoxia with  ambulation.  At the time of discharge her room air ambulation was 82%.  It  was, therefore, determined by my partner that she should be discharged home  to continue her prednisone taper, her steroids, and supportive home O2.   LABORATORIES:  White count 13.5 on admission, hemoglobin 11.5 and 33.7  hematocrit with platelets 277.  She had predominance of neutrophils at 79%.  Urinalysis at the time of admission was negative.  Glucose on admission was  slightly elevated at 159.  A hemoglobin A1C was therefore obtained which was  slightly elevated at 6.4.  It should be recommended that her primary care  physician follow her fasting blood sugars as  an outpatient once she has  discontinued her prednisone taper.  On the day of discharge the patient was  seen and examined by Dr. Renford Miranda and deemed stable for discharge.  Her vital signs were noted to be  stable.  She was afebrile and as stated her room air saturations were 82%.  Home oxygen was provided by [facility] and the patient was  instructed to follow up with Dr. Shelle Miranda on the week of discharge.  The  patient already had a scheduled appointment to see him.      MLT/MEDQ  D:  11/22/2004  T:  11/22/2004  Job:  [id number]   cc:   [name], M.D. [facility]   [name], M.D.  [address]  [place]  [place] [address]  Fax: [phone]

## 2011-03-24 NOTE — H&P (Signed)
[name], GREASER                ACCOUNT NO.:  [id number]   MEDICAL RECORD NO.:  [id number]          PATIENT TYPE:  EMS   LOCATION:  ED                           FACILITY:  [facility]   PHYSICIAN:  Melissa L. Ladona Ridgel, MD  DATE OF BIRTH:  1940-11-26   DATE OF ADMISSION:  11/05/2004  DATE OF DISCHARGE:                                HISTORY & PHYSICAL   CHIEF COMPLAINT:  Shortness of breath times four days.   PRIMARY CARE PHYSICIANS:  Anna Genre. Little, M.D. and [name], M.D. [facility]   HISTORY OF PRESENT ILLNESS:  The patient is a 70 year old white female who  started having a headache on Tuesday and did not quite feel right.  She also  felt mildly short of breath.  She required multiple uses of her Albuterol  MDI.  By Wednesday she started to cough with no sputum production, but a  dry, hacking, persistent cough.  She complains of frontal sinus pain and  abdominal pain secondary to coughing.   REVIEW OF SYMPTOMS:  GENERAL:  She has had no weight loss or weight gain.  She denies fevers or chills although was febrile here in the emergency room.  She says her appetite on average is low, but for the last few days has been  really decreased.  She says she really just has been drinking a lot of  different kinds of fluid.  GU:  She states that she is making good urine.  She denies any dysuria.  GI:  She denies diarrhea.  She denies constipation.  All other review of systems are negative including any complaints of  musculoskeletal pain.   PAST MEDICAL HISTORY:  1.  Chronic obstructive pulmonary disease.  2.  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 Luther Parody and Dr. Derrell Lolling.  3.  Please note that she is not on home 02.   PAST SURGICAL HISTORY:  1.  Hysterectomy in 1990.  2.  Knee surgery in 1968.  3.  Previously had a collapsed lung related to a fall.   SOCIAL HISTORY:  She does not currently smoke; she quit about ten years ago.  She has occasional alcohol.  She is a retired Engineer, site.   FAMILY HISTORY:  Mom is deceased secondary to hypertension and an  intracranial hemorrhage related to a fall.  Dad is deceased after a  myocardial infarction at the age of 68.  His first myocardial infarction was  at the age of 76.   ALLERGIES:  1.  PERCOCET which causes severe nausea.   MEDICATIONS:  1.  Lisinopril 20 mg daily.  2.  Lipitor 40 mg daily.  3.  Fosamax 70 mg q. weekly.  4.  Spiriva one capsule inhaled daily.  5.  Albuterol p.r.n.   PHYSICAL EXAMINATION:  VITAL SIGNS:  Temperature at present is 98.1 on  admission and was 100, blood pressure 129/50, pulse 125, respiratory rate  26, saturation is 97%.  GENERAL:  She is in no acute distress.  HEENT:  She is normocephalic, atraumatic.  Pupils equal, round and reactive  to light.  Extraocular muscles are intact.  Mucous membranes are dry.  Her  tongue is coated. The neck is supple. There is no JVD, no lymph nodes and no  carotid bruits.  CHEST:  Very distant breath sounds, no wheezing but very poor air entry as  well.  CARDIOVASCULAR:  Tachycardic, positive S1, S2.  No S3, S4, murmurs, rubs or  gallops.  She has distant heart sounds.  ABDOMEN:  Soft, non-tender and non-distended with positive bowel sounds.  EXTREMITIES:  Very thin with 2+ pulses and no cyanosis, clubbing or edema.  NEUROLOGIC:  She is awake, alert and oriented times three.  Cranial nerves  II-XII are intact.  Power is 5/5.  Deep tendon reflexes are 2+.   LABORATORY DATA:  White count 13.5 with a 20% bandemia, hemoglobin 11.5,  hematocrit 33.7 and platelets 277,000.  Sodium 136, potassium 3.9, chloride  100, C02 25, BUN 8, creatinine 0.9 and glucose is 174.   EKG shows sinus tachycardia at a rate of 122. Chest x-ray shows a right  lower lobe pneumonia with a cracked fifth rib of indeterminate age.   ASSESSMENT AND PLAN:  This is a pleasant 70 year old white female with a  cough times four days and increasing shortness of breath who is found to  have a  right lower lobe pneumonia superimposed on moderate chronic  obstructive pulmonary disease.  1.  Pulmonary.  Chronic obstructive pulmonary disease with right lower lobe      pneumonia.  We will continue her Rocephin, Azithromycin, add nebulizers,      Humibid and a flutter valve as well as cough medication.  I have      contacted Dr. Teddy Spike service and left word that the patient is      hospitalized.  2.  Cardiovascular.  She is tachycardic likely secondary to dehydration and      her underlying lung condition.  We will therefore admit her to telemetry      for careful monitoring.  We will continue her lisinopril with parameters      for lower blood pressure.  3.  Gastrointestinal.  She has a history of duodenal diverticula which is      currently stable.  I would like to add Protonix while she is on      steroids.  4.  Genito-urinary.  There are currently no symptoms, but we will follow her      I's and O's and check a urinalysis, culture and sensitivity.  5.  Endocrine.  She has an increased glucose likely secondary to steroids.      We will provide sliding scale insulin while on steroids.  6.  The patient is a Full Code and Full Care.     Meli   MLT/MEDQ  D:  11/05/2004  T:  11/05/2004  Job:  [id number]   cc:   [name], M.D. [facility]

## 2011-03-24 NOTE — Discharge Summary (Signed)
NAME:  [name], [name]                          ACCOUNT NO.:  [id number]   MEDICAL RECORD NO.:  [id number]                   PATIENT TYPE:  INP   LOCATION:  5711                                 FACILITY:  [facility]   PHYSICIAN:  Deirdre Peer. Polite, M.D.              DATE OF BIRTH:  December 15, 1940   DATE OF ADMISSION:  01/02/2004  DATE OF DISCHARGE:                                 DISCHARGE SUMMARY   DISCHARGE DIAGNOSES:  1. Five-centimeter right retroperitoneal lesion with air fluid level     consistent with duodenal diverticulum with associated perforation,     improved with antibiotics.  2. Severe chronic obstructive pulmonary disease.  3. High cholesterol.  4. Hypertension.  5. Anxiety.   DISCHARGE MEDICATIONS:  1. Cipro 500 mg b.i.d. to complete a total of two week course.  2. Flagyl 500 mg t.i.d. to complete a total of two week course.  3. Spiriva.  4. Lipitor.  5. Lisinopril.  6. Fosamax. Resume home dosages.  7. Xanax 0.25 mg every 6 to 8 hours as needed for anxiety.   CONSULTATIONS:  1. Dr. Derrell Lolling, general surgery.  2. Dr. Luther Parody, gastrointestinal.  3. Dr. [name], pulmonary.   STUDIES:  On January 04, 2004, CT of the chest showed no interval change of  nearly 5-cm right retroperitoneal lesion, thought to be a complication from  a duodenal diverticulum. CT scan of the abdomen, February 26:  Abnormal  collection located between the duodenum and the pancreas and extended  inferiorly to below the level of the kidneys, associated with perforated  duodenal ulcer with walled-off abscess. Chest x-ray:  Right lower lobe  infiltrate with pleural effusion, fracture of the left sixth rib of  undeterminate age. Blood culture:  No growth. CBC on admission:  White count  16.3, hemoglobin 11.8, 94% neutrophils, platelets 260. At discharge, CBC:  White count 6.8, hemoglobin 10.9, platelets 431. BMET within normal limits  on admission. Discharged within normal limits except for  hypokalemia,  potassium 2.5. AST/ALT 30 and 42 respectively. CA19 less than 1.2. UA  negative.   HISTORY OF PRESENT ILLNESS:  A 70 year old female with history of  hypertension, high cholesterol who presented to the ED with a history of  right flank pain wrapping around to the right side with associated nausea  and vomiting. In the ED, the patient was evaluated and found to have  leukocytosis with fever of 100.3 and abnormal CT showing a walled off  process between the pancreas and the duodenum approximately 5 cm. Admission  was deemed necessary for further evaluation and treatment.   PAST MEDICAL HISTORY:  1. COPD.  2. Hypertension.  3. High cholesterol.   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 No chemo or radiation.   SOCIAL HISTORY:  Negative for tobacco for 10 years, no alcohol.   MEDICATIONS ON ADMISSION:  1. Spiriva.  2.  Fosamax.  3. Lipitor.  4. Lisinopril.   ALLERGIES:  The patient described allergy to Percocet.   HOSPITAL COURSE:  1. 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 The     patient was admitted to a medicine floor bed for evaluation and     treatment. The patient was seen in consultation by surgery as well as by     [facility]. In the interim, the patient was on IV antibiotics. Her     differential for the process included diverticular abscess versus     malignancy. It was felt that an EGD would be prudent to see if there was     associated diverticula at that area seen on CAT scan. As stated, patient     was seen by [facility], Dr. Luther Parody, who performed an EGD. EGD revealed a     duodenal diverticula with debris noted. There was no tumor or other     lesions. It was felt prudent to continue antibiotics as the patient was     improving with antibiotics. She had decreased fever and decreased     abdominal discomfort and was tolerating clear liquids. It was felt that     the patient should continue  a course of antibiotics with outpatient     followup and no surgery at this time. The patient has a minor associated     abnormality on admission in her LFTs which were improving on followup     studies. The patient will complete a total of two-week course of     antibiotics and will follow up with Dr. Derrell Lolling on an outpatient basis in     approximately two weeks and at that time will have repeat CAT scan plus     or minus repeat EGD if deemed necessary.  2. Chronic obstructive pulmonary disease. As stated, the patient had severe     significant history of severe COPD. She has been followed by Dr. Shelle Iron.     Of note, she has been without O2 requirements. During this     hospitalization, the patient has had O2 requirements and had some     worsening of her breathing, mostly related to aggressive volume     resuscitation; when given IV Lasix and saline locking her IV fluids, she     had significant improvement in her pulmonary symptoms; however, she still     had some dyspnea that was greater than before and continued to require     O2. Saturations continued to drop with ambulatory pulse oximetry. The     patient was very adamant about not wanting to pursue home oxygen and at     the time of this dictation is awaiting repeat visit by her pulmonologist     to have further discussion. At this time, she was expected to continue     her regimen home medications, and post the discussion with her     pulmonologist, we will make a decision if she is to require home oxygen.   The patient has other medical problems which have been outlined above, and  she will continue her current home medications. At this time, she is  medically stable for discharge.                                                Deirdre Peer. Polite,  M.D.    RDP/MEDQ  D:  01/11/2004  T:  01/12/2004  Job:  [id number]

## 2011-03-24 NOTE — Consult Note (Signed)
NAME:  [name], [name]                          ACCOUNT NO.:  [id number]   MEDICAL RECORD NO.:  [id number]                   PATIENT TYPE:  INP   LOCATION:  1824                                 FACILITY:  [facility]   PHYSICIAN:  Angelia Mould. Derrell Lolling, M.D.             DATE OF BIRTH:  03-31-1941   DATE OF CONSULTATION:  01/02/2004  DATE OF DISCHARGE:                                   CONSULTATION   REASON FOR CONSULTATION:  Evaluate abdominal pain and pancreatic mass.   HISTORY OF PRESENT ILLNESS:  This is a 70 year old white female who was in  stable health until about 7 p.m. last night.  At that time, she developed  nausea and vomiting, and she has vomited several times since then.  She has  vomited food and light bilious material, but no coffee grounds, and no  blood.  She also developed some right flank pain, which has persisted to the  present.  She was evaluated in the outpatient clinic earlier today, and  ultimately was transferred to the [facility] ER.  She denies any change in her  stools, no diarrhea, no constipation.  She has not had anything like this  happen to her before.  She denies that her abdomen hurts anteriorly.  She  denies trauma.   She came to the emergency room, and was found to have a low-grade fever to  100.3, and a white blood cell count of 16,000.  She had an ultrasound which  was not very helpful, and then she had a CT scan without contrast looking  for kidney stones, and it showed what looks like some inflammatory changes  around the head of the pancreas, maybe a few air bubbles within the  pancreas, maybe some fluid.  Difficult to evaluate because of the absence of  oral contrast.  There is no free fluid in the abdomen.  There is no free air  noted.  No other abnormalities were noted on the CT.  I was called to  evaluate her.   PAST HISTORY:  1. Hypertension.  2. Hypercholesterolemia.  3. COPD.  4. Total abdominal hysterectomy.  5. Reportedly stage I ovarian  cancer.  No chemotherapy or radiation.  6. She has had left knee surgery.  7. She denies any history of ulcer disease, gastritis, gallbladder disease,     or pancreatic or liver disease.   CURRENT MEDICATIONS:  1. Spiriva.  2. Fosamax 20 mg daily.  3. Lipitor 20 mg daily.  4. Lisinopril 20 mg daily.  5. Albuterol inhaler p.r.n.   DRUG ALLERGIES:  PERCOCET.   FAMILY HISTORY:  No history of pancreatic or colon cancer.  Father died of a  myocardial infarction.  Mother died of complications following a hip  fracture.  She had coronary artery disease and hypertension.  One brother  living with hypertension.   SOCIAL HISTORY:  She is single.  She has no children.  She quit smoking in  1990.  She drinks a glass of wine daily.   REVIEW OF SYSTEMS:  All systems reviewed.  They are already documented on  the chart and noncontributory, except as described above.   PHYSICAL EXAMINATION:  GENERAL:  A pleasant older woman.  Thin.  Alert and  cooperative.  Pleasant.  Minimal distress.  VITAL SIGNS:  Temperature 100.3, blood pressure 117/57, heart rate 97,  respiratory rate 18, oxygen saturation 95% on room air.  HEENT:  Eyes - sclerae are clear.  Extraocular movements intact.  Ears,  nose, throat, lips, tongue, and oropharynx without gross lesions.  NECK:  Supple, nontender.  No mass, no crepitus, no tenderness.  LUNGS:  Distant breath sounds.  A few crackles at the bases.  No CVA  tenderness or chest wall tenderness.  HEART:  Regular rate and rhythm.  No murmur.  ABDOMEN:  Soft.  Occasional bowel sounds noted.  Really not tender.  Really  not distended.  No mass.  Well-healed lower midline scar.  No hernia there.  BACK:  No CVA tenderness to correlate with her right flank pain.  EXTREMITIES:  She moves all 4 extremities well without pain, or deformity,  or edema.  NEUROLOGIC:  No gross motor or sensory deficits.   ASSESSMENT:  1. Inflammatory process around the head of the pancreas.   Considerations     would be perforated duodenal diverticulum, contained perforation of a     duodenal ulcer, remote likelihood of pancreatic cancer.  2. Hypertension.  3. Chronic obstructive pulmonary disease.  4. Status post hysterectomy.   PLAN:  1. The patient will be admitted and started on broad spectrum antibiotics.  2. We are going to take her back for a CT scan with oral contrast to see if     there is any communication with the head of the pancreas with the GI     tract to suggest a perforation.  3. We are going to repeat all of her labs and get a CA 19.9.  4. We are going to put her on H-2 receptor blockers.                                               Angelia Mould. Derrell Lolling, M.D.    HMI/MEDQ  D:  01/02/2004  T:  01/02/2004  Job:  [id number]   cc:   Dr. Paulita Fujita, [facility]. Little, M.D.  [address]  [place]  [place] [address]  Fax: [phone]   Eliezer Lofts(?), M.D.

## 2011-03-24 NOTE — Consult Note (Signed)
NAME:  [name], [name]                          ACCOUNT NO.:  [id number]   MEDICAL RECORD NO.:  [id number]                   PATIENT TYPE:  INP   LOCATION:  5711                                 FACILITY:  [facility]   PHYSICIAN:  Althea Grimmer. Luther Parody, M.D.            DATE OF BIRTH:  Feb 10, 1941   DATE OF CONSULTATION:  01/05/2004  DATE OF DISCHARGE:                                   CONSULTATION   HISTORY OF PRESENT ILLNESS:  [name] is a 70 year old female whom I am  asked to see by Dr. Nehemiah Settle for an abnormal CT scan and abdominal pain.  She  began having relatively severe right upper quadrant and right flank  discomfort on February 25.  This came on abruptly at night and was  associated with nausea and vomiting.  She had no hematemesis or melena.  She  presented to urgent care and then was referred to the emergency room with a  temperature of 100.3 and white blood count of 16,000.  A CT scan done since  admission demonstrates small bilateral pleural effusions and a right  retroperitoneal lesion which is approximately 4-5 cm in diameter and filled  with debris and gas.  In addition, the duodenal wall in that area is  thickened.  There is no clear pancreatic or biliary pathology.  Her  hemoglobin is 9.9 and her white count has dropped now to 9.9 on Zosyn.  Electrolytes are normal.  Albumin 3.1.  Liver function tests, amylase and  lipase are normal.  CA 19-9 is normal.  Urinalysis is negative and blood  cultures x2 are negative at 3 days.  The patient has had no known gallstones  in the past.   PAST MEDICAL HISTORY:  1. Chronic obstructive pulmonary disease with history of cigarette use until     1990.  2. Hypertension.  3. Left pneumothorax status post a fall.  4. Hypercholesterolemia.   PAST SURGICAL HISTORY:  Status post total abdominal hysterectomy for stage I  ovarian cancer.  She also had left knee surgery.   MEDICATIONS:  1. Spiriva.  2. Ventolin.  3. Atrovent inhalers.  4.  Pepcid 20 mg q.12h. IV.  5. Zosyn.   ALLERGIES:  PERCOCET.   FAMILY HISTORY:  Negative for gastrointestinal neoplasia.   SOCIAL HISTORY:  Nonsmoker since 61.  Drinks one glass of wine per day.   REVIEW OF SYSTEMS:  GENERAL:  No weight loss or night sweats.  ENDOCRINE:  No history of diabetes or thyroid problems.  SKIN:  No rash or pruritus.  EYES:  No icterus or change in vision.  ENT:  No aphthous, ulcers or chronic  sore throat.  RESPIRATORY:  Chronically, mildly short of breath, much worse  since Friday.  CARDIAC:  No chest pain, palpitations or history of valvular  heart disease.  GI:  As above.  GU:  No dysuria or hematuria.  Remainder of  review of  systems is negative.   PHYSICAL EXAMINATION:  VITAL SIGNS:  Afebrile, blood pressure 108/66, pulse  74 and regular.  SKIN:  Normal.  HEENT:  Eyes anicteric.  Oropharynx unremarkable.  NECK:  Supple without thyromegaly.  There is no cervical or inguinal  adenopathy.  CHEST:  Decreased breath sounds with scattered wheezes.  HEART:  Sounds are distant, regular rate and rhythm.  ABDOMEN:  Mildly distended but soft with active bowel sounds.  I do not  appreciate any real tenderness to deep palpation.  There are no masses.  RECTAL:  Not performed.  EXTREMITIES:  Without cyanosis, clubbing, edema or rash.   IMPRESSION:  Probable retroperitoneal abscess in the area of the second to  third duodenum.  This could be related to pancreatic or biliary pathology or  a duodenal diverticulitis or a perforated ulcer or neoplasm.   PLAN:  Careful upper endoscopy would probably help sort out the problem  without endangering her condition.  The procedure is reviewed with the  patient in terms of technique, preparation and risks of complications  including bleeding or worsening perforation.  She agrees to proceed and it  will be scheduled later today.   ADDENDUM:  Upper endoscopy was completed and dictated separately.  She has a  small probably  insignificant hiatal hernia and known noted ulcer disease.  There is no duodenal neoplasm apparent.  A periampullary diverticulum could  only be seen when the side viewing scope was inserted.  There was abundant  debris within this duodenal diverticulum which is the likely source of the  CT findings.  I suspect this may have become infected, possibly when the  patient passed an unsuspected gallstone which could have perforated the  ampulla in the area of the diverticulum.  However, this would be rather  unusual and we have no abnormal amylase, lipase or liver function tests  which would suggest this scenario.  I would suggest that she be followed by  the surgeons and continued on antibiotics.  If she can be managed  conservatively without surgery, a CT scan and possibly upper GI series as  well with small bowel follow through should be performed within a few weeks.  Further recommendations will follow her clinical course.                                               Althea Grimmer. Luther Parody, M.D.    PJS/MEDQ  D:  01/05/2004  T:  01/05/2004  Job:  [id number]   cc:   Angelia Mould. Derrell Lolling, M.D.  [address], [address]  [place]  [place] [address]  Fax: [phone]

## 2011-04-26 ENCOUNTER — Telehealth: Payer: Self-pay | Admitting: Pulmonary Disease

## 2011-04-26 MED ORDER — PREDNISONE 10 MG PO TABS: ORAL_TABLET | ORAL | Status: DC

## 2011-04-26 NOTE — Telephone Encounter (Signed)
Spoke with patient-aware of Rx and recs from [facility]

## 2011-04-26 NOTE — Telephone Encounter (Signed)
Let pt know that we can call in a quick taper of prednisone to see if she gets better.  However, if her chest pain is persistent tonight at rest and not going away, she needs to go to er to make sure not cardiac.  Make sure she has tried her rescue inhaler as well.   Prednisone 10mg :  40mg  a day for 2 days, 30mg  a day for 2 days, 20mg  a day for 2 days, then 10mg  a day for 2 days, then stop.  QS, no fills.

## 2011-04-26 NOTE — Telephone Encounter (Signed)
Called spoke with patient who c/o DOE and tightness in chest onset yesterday, worse today.  States has been using symbicort and spiriva daily.  Has used albuterol twice today.  KC please advise, thanks.  Allergies  Allergen Reactions  . Oxycodone-Acetaminophen     REACTION: headaches,nausea   [facility] [address]

## 2011-04-27 ENCOUNTER — Ambulatory Visit (INDEPENDENT_AMBULATORY_CARE_PROVIDER_SITE_OTHER): Payer: Medicare Other | Admitting: Adult Health

## 2011-04-27 ENCOUNTER — Encounter: Payer: Self-pay | Admitting: Adult Health

## 2011-04-27 VITALS — BP 122/70 | HR 92 | Temp 98.2°F | Ht 61.5 in | Wt 99.0 lb

## 2011-04-27 DIAGNOSIS — J438 Other emphysema: Secondary | ICD-10-CM

## 2011-04-27 NOTE — Assessment & Plan Note (Addendum)
Flare with associated anxiety component  Plan:  Finish Prednisone as directed.  Use Albuterol neb As needed   May use xanax As needed  For anxiety follow up Dr. Shelle Iron as planned and As needed   Please contact office for sooner follow up if symptoms do not improve or worsen or seek emergency care

## 2011-04-27 NOTE — Patient Instructions (Signed)
Finish Prednisone as directed.  Use Albuterol neb As needed   May use xanax As needed  For anxiety follow up Dr. Shelle Iron as planned and As needed   Please contact office for sooner follow up if symptoms do not improve or worsen or seek emergency care

## 2011-04-27 NOTE — Progress Notes (Signed)
  Subjective:    Patient ID: [name], female    DOB: 04-03-1941, 70 y.o.   MRN: [id number]  HPI 70 yo WF with known hx of Severe COPD w/ Emphysema  04/27/11 Acute OV  Pt presents for an acute office visit. Complains increased SOB, tightness in chest x2days - was given pred taper yesterday via phone message. She has taken 2 days of steroids , today in the office she is feeling better. She feels the heat and anxiety are big factors in her breathing trouble. She has had to get in out of house which she is not used to. She was very anxious overnight and early am -she took a xanax this am which has helped. She denies increased cough, congestion or fever. No exertional chest pain or palpitations.   Recently had eye procedure requiring frequent eye drops. She is fatigued and stressed from eye issues. Has been seen at eye doctor several times over last week which is wearing her out.   Review of Systems Constitutional:   No  weight loss, night sweats,  Fevers, chills, + fatigue, or  lassitude.  HEENT:   No headaches,  Difficulty swallowing,  Tooth/dental problems, or  Sore throat,                No sneezing, itching, ear ache, nasal congestion, post nasal drip,  Left eye irritation recent eye surgery.   CV:  No chest pain,  Orthopnea, PND, swelling in lower extremities, anasarca, dizziness, palpitations, syncope.   GI  No heartburn, indigestion, abdominal pain, nausea, vomiting, diarrhea, change in bowel habits, loss of appetite, bloody stools.   Resp:   No excess mucus, no productive cough,  No non-productive cough,  No coughing up of blood.  No change in color of mucus.   .  No chest wall deformity  Skin: no rash or lesions.  GU: no dysuria, change in color of urine, no urgency or frequency.  No flank pain, no hematuria   MS:  No joint pain or swelling.  No decreased range of motion.   Psych:  No change in mood or affect. No depression or anxiety.  No memory loss.           Objective:   Physical Exam GEN: A/Ox3; pleasant , NAD, frail and elderly, chronically ill appearing.   HEENT:  [place]/AT,  EACs-clear, TMs-wnl, NOSE-clear, THROAT-clear, no lesions, no postnasal drip or exudate noted.   NECK:  Supple w/ fair ROM; no JVD; normal carotid impulses w/o bruits; no thyromegaly or nodules palpated; no lymphadenopathy.  RESP  Coarse BS w/ diminshed BS in bases, no accessory muscle use, no dullness to percussion  CARD:  RRR, no m/r/g  , no peripheral edema, pulses intact, no cyanosis or clubbing.  GI:   Soft & nt; nml bowel sounds; no organomegaly or masses detected.  Musco: Warm bil, no deformities or joint swelling noted.   Neuro: alert, no focal deficits noted.    Skin: Warm, no lesions or rashes         Assessment & Plan:

## 2011-04-28 NOTE — Progress Notes (Signed)
Reviewed, and agree with plan as documented.

## 2011-08-07 LAB — BASIC METABOLIC PANEL
BUN: 15
BUN: 15
BUN: 15
BUN: 17
BUN: 17
CO2: 30
CO2: 34 — ABNORMAL HIGH
CO2: 36 — ABNORMAL HIGH
Calcium: 10
Calcium: 9.2
Chloride: 100
Chloride: 104
Chloride: 96
Creatinine, Ser: 0.75
Creatinine, Ser: 0.78
Creatinine, Ser: 0.81
Creatinine, Ser: 0.98
GFR calc Af Amer: >60
GFR calc Af Amer: >60
GFR calc Af Amer: >60
GFR calc non Af Amer: >60
GFR calc non Af Amer: >60
Glucose, Bld: 107 — ABNORMAL HIGH
Glucose, Bld: 140 — ABNORMAL HIGH
Glucose, Bld: 145 — ABNORMAL HIGH
Glucose, Bld: 151 — ABNORMAL HIGH
Potassium: 4.6
Sodium: 141

## 2011-08-07 LAB — CBC
HCT: 37.9
Hemoglobin: 12.7
Hemoglobin: 12.9
MCHC: 33.2
MCHC: 33.4
MCHC: 33.8
MCV: 96.7
MCV: 97.3
MCV: 97.6
MCV: 97.8
Platelets: 194
Platelets: 200
Platelets: 204
Platelets: 213
RBC: 3.98
RBC: 4.01
RDW: 13.6
RDW: 13.9
RDW: 14.1
WBC: 12.5 — ABNORMAL HIGH
WBC: 7.6
WBC: 8.5

## 2011-08-07 LAB — URINE CULTURE: Culture: NO GROWTH

## 2011-08-07 LAB — DIFFERENTIAL
Basophils Absolute: 0
Basophils Absolute: 0
Basophils Relative: 0
Eosinophils Absolute: 0
Eosinophils Absolute: 0
Eosinophils Absolute: 0
Eosinophils Relative: 0
Lymphocytes Relative: 5 — ABNORMAL LOW
Lymphocytes Relative: 6 — ABNORMAL LOW
Lymphocytes Relative: 7 — ABNORMAL LOW
Lymphs Abs: 0.5 — ABNORMAL LOW
Lymphs Abs: 0.6 — ABNORMAL LOW
Lymphs Abs: 0.7
Monocytes Absolute: 0 — ABNORMAL LOW
Monocytes Absolute: 0.4
Monocytes Relative: 1 — ABNORMAL LOW
Neutro Abs: 7
Neutro Abs: 7.5
Neutrophils Relative %: 89 — ABNORMAL HIGH
Neutrophils Relative %: 92 — ABNORMAL HIGH

## 2011-08-07 LAB — URINALYSIS, ROUTINE W REFLEX MICROSCOPIC
Glucose, UA: NEGATIVE
Ketones, ur: NEGATIVE
Nitrite: NEGATIVE
Protein, ur: NEGATIVE

## 2011-08-07 LAB — COMPREHENSIVE METABOLIC PANEL
Albumin: 3.8
Alkaline Phosphatase: 79
BUN: 11
Calcium: 9.4
Potassium: 3.7
Total Protein: 6.4

## 2011-08-07 LAB — BLOOD GAS, ARTERIAL
FIO2: 0.28
O2 Saturation: 97.7
Patient temperature: 98.6
pH, Arterial: 7.359

## 2011-08-07 LAB — TROPONIN I: Troponin I: <0.01

## 2011-08-07 LAB — APTT: aPTT: 27

## 2011-08-07 LAB — PROTIME-INR: INR: 0.9

## 2011-08-07 LAB — CK: Total CK: 33

## 2011-08-15 ENCOUNTER — Encounter: Payer: Self-pay | Admitting: Pulmonary Disease

## 2011-08-15 ENCOUNTER — Ambulatory Visit (INDEPENDENT_AMBULATORY_CARE_PROVIDER_SITE_OTHER): Payer: Medicare Other | Admitting: Pulmonary Disease

## 2011-08-15 VITALS — BP 120/76 | HR 88 | Temp 98.3°F | Ht 61.5 in | Wt 99.6 lb

## 2011-08-15 DIAGNOSIS — J438 Other emphysema: Secondary | ICD-10-CM

## 2011-08-15 MED ORDER — ALBUTEROL SULFATE HFA 108 (90 BASE) MCG/ACT IN AERS: 2.0000 | INHALATION_SPRAY | Freq: Four times a day (QID) | RESPIRATORY_TRACT | Status: DC | PRN

## 2011-08-15 MED ORDER — TIOTROPIUM BROMIDE MONOHYDRATE 18 MCG IN CAPS: 18.0000 ug | ORAL_CAPSULE | Freq: Every day | RESPIRATORY_TRACT | Status: DC

## 2011-08-15 MED ORDER — BUDESONIDE-FORMOTEROL FUMARATE 160-4.5 MCG/ACT IN AERO: 2.0000 | INHALATION_SPRAY | Freq: Two times a day (BID) | RESPIRATORY_TRACT | Status: DC

## 2011-08-15 NOTE — Progress Notes (Signed)
  Subjective:    Patient ID: [name], female    DOB: 1940/11/15, 70 y.o.   MRN: [id number]  HPI Patient comes in today for followup of her known emphysema with chronic respiratory failure.  She has done well since the last visit, and feels that her breathing is at baseline.  She has not had a recent pulmonary infection, and denies any chest congestion or mucus production.  She has not had any lower extremity edema.   Review of Systems  Constitutional: Negative for fever and unexpected weight change.  HENT: Positive for voice change. Negative for ear pain, nosebleeds, congestion, sore throat, rhinorrhea, sneezing, trouble swallowing, dental problem, postnasal drip and sinus pressure.   Eyes: Negative for redness and itching.  Respiratory: Negative for cough, chest tightness, shortness of breath and wheezing.   Cardiovascular: Negative for palpitations and leg swelling.  Gastrointestinal: Negative for nausea and vomiting.  Genitourinary: Negative for dysuria.  Musculoskeletal: Negative for joint swelling.  Skin: Negative for rash.  Neurological: Negative for headaches.  Hematological: Does not bruise/bleed easily.  Psychiatric/Behavioral: Negative for dysphoric mood. The patient is not nervous/anxious.        Objective:   Physical Exam Thin female in no acute distress Nose without purulence or discharge noted Chest with very diminished breath sounds bilaterally, no definite wheezes or rhonchi Cardiac exam with distant heart sounds, sounds regular. Lower extremities without edema, no cyanosis noted Alert and oriented, moves all 4 extremities.       Assessment & Plan:

## 2011-08-15 NOTE — Assessment & Plan Note (Signed)
The patient is doing well from a COPD standpoint on her current bronchodilator regimen and oxygen.  She feels that her exertional tolerance is at baseline, and has not had a recent pulmonary infection.  I have asked her to continue her current medications, and to stay as active as possible.  She is to followup with me in 6 months.

## 2011-08-15 NOTE — Patient Instructions (Signed)
No change in medication Stay on oxygen, and stay as active as possible. followup with me in 6mos, but call if having issues.

## 2011-12-08 ENCOUNTER — Telehealth: Payer: Self-pay | Admitting: Pulmonary Disease

## 2011-12-08 DIAGNOSIS — J438 Other emphysema: Secondary | ICD-10-CM

## 2011-12-08 MED ORDER — ALBUTEROL SULFATE (2.5 MG/3ML) 0.083% IN NEBU: 2.5000 mg | INHALATION_SOLUTION | Freq: Four times a day (QID) | RESPIRATORY_TRACT | Status: DC | PRN

## 2011-12-08 NOTE — Telephone Encounter (Signed)
I spoke with pt and she states she needed a [facility]ill on [facility] I advised will send rx and nothing further was needed

## 2011-12-12 ENCOUNTER — Telehealth: Payer: Self-pay | Admitting: Pulmonary Disease

## 2011-12-12 MED ORDER — PREDNISONE 10 MG PO TABS: ORAL_TABLET | ORAL | Status: DC

## 2011-12-12 NOTE — Telephone Encounter (Signed)
Pt called back to add the following: says she also has some drainage in her throat (from sinus x 2 days). [name]

## 2011-12-12 NOTE — Telephone Encounter (Signed)
Pt calling back and is anxious to have something called to the pharmacy. She says she does not want to go to the hospital and is afraid she may not be able to get to the pharmacy later tonight. Pls advise.

## 2011-12-12 NOTE — Telephone Encounter (Signed)
I spoke with pt and is aware rx was [facility] [address] (per pt request) and to call back if she does not improve or brings up nasty mucus

## 2011-12-12 NOTE — Telephone Encounter (Signed)
Pt is calling back to see if [facility] is going to call in the prednisone Rx.  Please advise.

## 2011-12-12 NOTE — Telephone Encounter (Signed)
I spoke with pt and she SOB w/ exertion and when she talks x2 days. Pt states she states when she sits down she is fine. C/o occasional dry cough, chest tightness. Denies any wheezing, fever, chills, sweats, nausea, vomiting. Pt states she is using her inhalers as directed. Pt is requesting to have prednisone called in. Please advise Dr. Shelle Iron, thanks  Allergies  Allergen Reactions  . Oxycodone-Acetaminophen     REACTION: headaches,nausea

## 2011-12-12 NOTE — Telephone Encounter (Signed)
Ok to call in prednisone 10mg  Take 4 each day for 2 days, 3 each day for 2 days, then 2 each day for 2 days, then one each day for 2 days, then stop. No fills. Have her call us if she begins to cough up nasty mucus, or if she doesn't get better.

## 2011-12-21 ENCOUNTER — Ambulatory Visit (INDEPENDENT_AMBULATORY_CARE_PROVIDER_SITE_OTHER): Payer: Medicare Other | Admitting: Pulmonary Disease

## 2011-12-21 ENCOUNTER — Telehealth: Payer: Self-pay | Admitting: Pulmonary Disease

## 2011-12-21 ENCOUNTER — Encounter: Payer: Self-pay | Admitting: Pulmonary Disease

## 2011-12-21 VITALS — BP 118/72 | HR 98 | Temp 98.1°F | Ht 61.5 in | Wt 98.6 lb

## 2011-12-21 DIAGNOSIS — J438 Other emphysema: Secondary | ICD-10-CM

## 2011-12-21 NOTE — Progress Notes (Signed)
  Subjective:    Patient ID: [name], female    DOB: 08-30-1941, 71 y.o.   MRN: [id number]  HPI The pt comes in today for f/u of her know severe copd.  She had a recent acute exacerbation, but responded very well to a short prednisone taper.  She feels she is back to her usual baseline.  She is having no cough or congestion currently.  She also needs to be recertified for oxygen today.    Review of Systems  Constitutional: Negative for fever and unexpected weight change.  HENT: Negative for ear pain, nosebleeds, congestion, sore throat, rhinorrhea, sneezing, trouble swallowing, dental problem, postnasal drip and sinus pressure.   Eyes: Negative for redness and itching.  Respiratory: Negative for cough, chest tightness, shortness of breath and wheezing.   Cardiovascular: Negative for palpitations and leg swelling.  Gastrointestinal: Negative for nausea and vomiting.  Genitourinary: Negative for dysuria.  Musculoskeletal: Negative for joint swelling.  Skin: Negative for rash.  Neurological: Negative for headaches.  Hematological: Does not bruise/bleed easily.  Psychiatric/Behavioral: Negative for dysphoric mood. The patient is not nervous/anxious.        Objective:   Physical Exam Thin female in no acute distress Nose with purulent discharge noted Chest with very decreased breath sounds throughout, no wheezes Cardiac exam with regular rate and rhythm Lower extremities without edema, no cyanosis Alert and oriented, moves all 4 extremities.       Assessment & Plan:

## 2011-12-21 NOTE — Telephone Encounter (Signed)
I spoke with [facility] and wad advised that pt's albuterol neb does not require PA. She ran it through medicare and it went through---LMOMTCB for pt to make aware

## 2011-12-21 NOTE — Assessment & Plan Note (Signed)
The patient has known severe COPD, and recently had an acute exacerbation that resolved very easily with a short prednisone taper.  She currently feels that she is back to her usual baseline, and has no new complaints.  She has been recertified on oxygen today with a sat of 83% on room air.  I have asked her to stay on her current medications, and to try and stay as active as possible to help with conditioning.  She will follow up again with me in 6 months.

## 2011-12-21 NOTE — Patient Instructions (Signed)
Will get your oxygen issues straightened out. No change in your medicines. Can cancel your next apptm, and have them change to 6mos from now.

## 2011-12-22 NOTE — Telephone Encounter (Signed)
I spoke with pt and advised her that we called the pharmacy and was advised the albuterol neb did not require a PA and they ran it through while on the phone with [place]. She states she will call her insurance then and see why they are not paying anything on this medication. Pt will call back if anything further was needed

## 2011-12-22 NOTE — Telephone Encounter (Signed)
Pt stated that in order for her insurance to help cover the cost of the albuterol neb, it must be pre-authorized.  She is not able to afford it w/out this pre-auth.  Pt would like to speak w/ someone this am ASAP.  Pt stated she will be leaving her house in a few minutes.  I asked the pt if there was a better time to reach her & she said to have triage call in the next few minutes.    [name]

## 2012-02-13 ENCOUNTER — Ambulatory Visit: Payer: Medicare Other | Admitting: Pulmonary Disease

## 2012-06-19 ENCOUNTER — Ambulatory Visit (INDEPENDENT_AMBULATORY_CARE_PROVIDER_SITE_OTHER): Payer: Medicare Other | Admitting: Pulmonary Disease

## 2012-06-19 ENCOUNTER — Encounter: Payer: Self-pay | Admitting: Pulmonary Disease

## 2012-06-19 VITALS — BP 120/60 | HR 91 | Temp 98.0°F | Ht 61.5 in | Wt 96.8 lb

## 2012-06-19 DIAGNOSIS — J438 Other emphysema: Secondary | ICD-10-CM

## 2012-06-19 MED ORDER — ALBUTEROL SULFATE HFA 108 (90 BASE) MCG/ACT IN AERS: 2.0000 | INHALATION_SPRAY | Freq: Four times a day (QID) | RESPIRATORY_TRACT | Status: DC | PRN

## 2012-06-19 MED ORDER — BUDESONIDE-FORMOTEROL FUMARATE 160-4.5 MCG/ACT IN AERO: 2.0000 | INHALATION_SPRAY | Freq: Two times a day (BID) | RESPIRATORY_TRACT | Status: DC

## 2012-06-19 MED ORDER — TIOTROPIUM BROMIDE MONOHYDRATE 18 MCG IN CAPS: 18.0000 ug | ORAL_CAPSULE | Freq: Every day | RESPIRATORY_TRACT | Status: DC

## 2012-06-19 NOTE — Assessment & Plan Note (Signed)
The patient overall is doing very well from a pulmonary standpoint.  She has had no recent pulmonary infection or acute exacerbation.  I have asked her to continue on her current medical regimen, and have encouraged her to consider the pulmonary rehabilitation program at the hospital.  She is agreeable to a referral.

## 2012-06-19 NOTE — Progress Notes (Signed)
  Subjective:    Patient ID: [name], female    DOB: 05-19-1941, 71 y.o.   MRN: [id number]  HPI The patient comes in today for followup of her known severe emphysema.  She has been doing well overall, with no recent exacerbation or infection.  She has been compliant with her bronchodilator regimen and her oxygen.  She has not seen a change in her baseline dyspnea on exertion, nor has she had any significant cough or congestion.   Review of Systems  Constitutional: Negative for fever and unexpected weight change.  HENT: Positive for postnasal drip. Negative for ear pain, nosebleeds, congestion, sore throat, rhinorrhea, sneezing, trouble swallowing, dental problem and sinus pressure.   Eyes: Negative for redness and itching.  Respiratory: Negative for cough, chest tightness, shortness of breath and wheezing.   Cardiovascular: Negative for palpitations and leg swelling.  Gastrointestinal: Negative for nausea and vomiting.  Genitourinary: Negative for dysuria.  Musculoskeletal: Negative for joint swelling.  Skin: Negative for rash.  Neurological: Negative for headaches.  Hematological: Does not bruise/bleed easily.  Psychiatric/Behavioral: Negative for dysphoric mood. The patient is not nervous/anxious.        Objective:   Physical Exam Thin female in no acute distress Nose without purulence or discharge noted.  She did have a sore on the inside of her left nostril from her oxygen tubing. Chest with decreased breath sounds, no active wheezing or rhonchi Cardiac exam with regular rate and rhythm Lower extremities without edema, no cyanosis Alert and oriented, moves all 4 extremities.       Assessment & Plan:

## 2012-06-19 NOTE — Patient Instructions (Addendum)
No change in current medications Will refer to pulmonary rehab program at [facility].  Please call us if you do not hear from them within 2 weeks. followup with me in 6 mos.

## 2012-08-29 ENCOUNTER — Telehealth (HOSPITAL_COMMUNITY): Payer: Self-pay | Admitting: *Deleted

## 2012-08-29 NOTE — Telephone Encounter (Signed)
Attempt made to call [name] on 08/27/2012 regarding referral to Pulmonary Rehab.  Message left for patient to return call and letter sent to call Pulm rehab

## 2012-09-09 ENCOUNTER — Ambulatory Visit (HOSPITAL_COMMUNITY): Payer: Medicare Other

## 2012-09-10 ENCOUNTER — Encounter (HOSPITAL_COMMUNITY): Payer: Medicare Other

## 2012-09-12 ENCOUNTER — Encounter (HOSPITAL_COMMUNITY): Payer: Medicare Other

## 2012-09-17 ENCOUNTER — Encounter (HOSPITAL_COMMUNITY): Payer: Medicare Other

## 2012-09-19 ENCOUNTER — Encounter (HOSPITAL_COMMUNITY): Payer: Medicare Other

## 2012-09-24 ENCOUNTER — Encounter (HOSPITAL_COMMUNITY): Payer: Medicare Other

## 2012-09-26 ENCOUNTER — Encounter (HOSPITAL_COMMUNITY)
Admission: RE | Admit: 2012-09-26 | Discharge: 2012-09-26 | Disposition: A | Discharge status: Home / Self Care | Payer: Medicare Other | Source: Ambulatory Visit | Attending: Pulmonary Disease | Admitting: Pulmonary Disease

## 2012-09-26 ENCOUNTER — Encounter (HOSPITAL_COMMUNITY): Payer: Self-pay

## 2012-09-26 ENCOUNTER — Encounter (HOSPITAL_COMMUNITY): Payer: Medicare Other

## 2012-09-26 DIAGNOSIS — Z5189 Encounter for other specified aftercare: Secondary | ICD-10-CM | POA: Insufficient documentation

## 2012-09-26 DIAGNOSIS — J438 Other emphysema: Secondary | ICD-10-CM | POA: Insufficient documentation

## 2012-09-26 HISTORY — DX: Essential (primary) hypertension: I10

## 2012-09-26 HISTORY — DX: Hyperlipidemia, unspecified: E78.5

## 2012-09-26 HISTORY — DX: Malignant neoplasm of left ovary: C56.2

## 2012-09-26 NOTE — Progress Notes (Signed)
[name] came today for orientation to Pulmonary Rehab.  Health history and medications reviewed with patient.  Goals set.  She is anxious, does not want to talk about the severity of her disease, states she knows her lungs are not good.  She also informs this Clinical research associate that she has not had a recent PFT and she refuses to have one for the previously stated reason.  Breath sounds distant.  6 min walk test done, she did have to stop after 2 min 30 sec for rest break and oxygen level dropped to 84 % on 2LC.  She walked a total of 600 feet.  Demonstration and practice of PLB using pulse oximeter.  Patient able to return demonstration satisfactorily. Safety and hand hygiene in the exercise area reviewed with patient.  Patient voices understanding.  She plans to start exercise on Dec 3rd.  We look forward to helping this nice lady.

## 2012-10-01 ENCOUNTER — Encounter (HOSPITAL_COMMUNITY): Payer: Medicare Other

## 2012-10-03 ENCOUNTER — Encounter (HOSPITAL_COMMUNITY): Payer: Medicare Other

## 2012-10-08 ENCOUNTER — Encounter (HOSPITAL_COMMUNITY)
Admission: RE | Admit: 2012-10-08 | Discharge: 2012-10-08 | Disposition: A | Discharge status: Home / Self Care | Payer: Medicare Other | Source: Ambulatory Visit | Attending: Pulmonary Disease | Admitting: Pulmonary Disease

## 2012-10-08 DIAGNOSIS — Z5189 Encounter for other specified aftercare: Secondary | ICD-10-CM | POA: Insufficient documentation

## 2012-10-08 DIAGNOSIS — J438 Other emphysema: Secondary | ICD-10-CM | POA: Insufficient documentation

## 2012-10-10 ENCOUNTER — Encounter (HOSPITAL_COMMUNITY): Payer: Medicare Other

## 2012-10-15 ENCOUNTER — Encounter (HOSPITAL_COMMUNITY)
Admission: RE | Admit: 2012-10-15 | Discharge: 2012-10-15 | Disposition: A | Discharge status: Home / Self Care | Payer: Medicare Other | Source: Ambulatory Visit | Attending: Pulmonary Disease | Admitting: Pulmonary Disease

## 2012-10-17 ENCOUNTER — Encounter (HOSPITAL_COMMUNITY)
Admission: RE | Admit: 2012-10-17 | Discharge: 2012-10-17 | Disposition: A | Discharge status: Home / Self Care | Payer: Medicare Other | Source: Ambulatory Visit | Attending: Pulmonary Disease | Admitting: Pulmonary Disease

## 2012-10-22 ENCOUNTER — Encounter (HOSPITAL_COMMUNITY)
Admission: RE | Admit: 2012-10-22 | Discharge: 2012-10-22 | Disposition: A | Discharge status: Home / Self Care | Payer: Medicare Other | Source: Ambulatory Visit | Attending: Pulmonary Disease | Admitting: Pulmonary Disease

## 2012-10-24 ENCOUNTER — Encounter (HOSPITAL_COMMUNITY): Payer: Medicare Other

## 2012-10-28 ENCOUNTER — Telehealth: Payer: Self-pay | Admitting: Critical Care Medicine

## 2012-10-28 MED ORDER — PREDNISONE 10 MG PO TABS: ORAL_TABLET | ORAL | Status: DC

## 2012-10-28 NOTE — Telephone Encounter (Signed)
Pt called for acute dyspnea Pred dose pak called. Pt will need OV soon

## 2012-10-29 ENCOUNTER — Inpatient Hospital Stay (HOSPITAL_COMMUNITY)
Admission: EM | Admit: 2012-10-29 | Discharge: 2012-11-29 | DRG: 166 | Disposition: A | Discharge status: Skilled Nursing Facility | Payer: Medicare Other | Attending: Pulmonary Disease | Admitting: Pulmonary Disease

## 2012-10-29 ENCOUNTER — Encounter (HOSPITAL_COMMUNITY): Payer: Medicare Other

## 2012-10-29 ENCOUNTER — Emergency Department (HOSPITAL_COMMUNITY): Payer: Medicare Other

## 2012-10-29 ENCOUNTER — Encounter (HOSPITAL_COMMUNITY): Payer: Self-pay | Admitting: Emergency Medicine

## 2012-10-29 DIAGNOSIS — Z8679 Personal history of other diseases of the circulatory system: Secondary | ICD-10-CM

## 2012-10-29 DIAGNOSIS — Z9981 Dependence on supplemental oxygen: Secondary | ICD-10-CM

## 2012-10-29 DIAGNOSIS — Z8639 Personal history of other endocrine, nutritional and metabolic disease: Secondary | ICD-10-CM

## 2012-10-29 DIAGNOSIS — F411 Generalized anxiety disorder: Secondary | ICD-10-CM

## 2012-10-29 DIAGNOSIS — K56609 Unspecified intestinal obstruction, unspecified as to partial versus complete obstruction: Secondary | ICD-10-CM

## 2012-10-29 DIAGNOSIS — R0602 Shortness of breath: Secondary | ICD-10-CM | POA: Insufficient documentation

## 2012-10-29 DIAGNOSIS — I10 Essential (primary) hypertension: Secondary | ICD-10-CM | POA: Diagnosis present

## 2012-10-29 DIAGNOSIS — D72829 Elevated white blood cell count, unspecified: Secondary | ICD-10-CM

## 2012-10-29 DIAGNOSIS — K560 Paralytic ileus: Secondary | ICD-10-CM | POA: Diagnosis not present

## 2012-10-29 DIAGNOSIS — Z9889 Other specified postprocedural states: Secondary | ICD-10-CM

## 2012-10-29 DIAGNOSIS — D638 Anemia in other chronic diseases classified elsewhere: Secondary | ICD-10-CM | POA: Diagnosis present

## 2012-10-29 DIAGNOSIS — E872 Acidosis, unspecified: Secondary | ICD-10-CM | POA: Diagnosis present

## 2012-10-29 DIAGNOSIS — J438 Other emphysema: Secondary | ICD-10-CM

## 2012-10-29 DIAGNOSIS — R5381 Other malaise: Secondary | ICD-10-CM | POA: Diagnosis present

## 2012-10-29 DIAGNOSIS — J439 Emphysema, unspecified: Secondary | ICD-10-CM

## 2012-10-29 DIAGNOSIS — J441 Chronic obstructive pulmonary disease with (acute) exacerbation: Principal | ICD-10-CM

## 2012-10-29 DIAGNOSIS — Z01811 Encounter for preprocedural respiratory examination: Secondary | ICD-10-CM

## 2012-10-29 DIAGNOSIS — I9589 Other hypotension: Secondary | ICD-10-CM | POA: Diagnosis not present

## 2012-10-29 DIAGNOSIS — Z7982 Long term (current) use of aspirin: Secondary | ICD-10-CM

## 2012-10-29 DIAGNOSIS — IMO0002 Reserved for concepts with insufficient information to code with codable children: Secondary | ICD-10-CM

## 2012-10-29 DIAGNOSIS — R34 Anuria and oliguria: Secondary | ICD-10-CM | POA: Diagnosis present

## 2012-10-29 DIAGNOSIS — J95821 Acute postprocedural respiratory failure: Secondary | ICD-10-CM

## 2012-10-29 DIAGNOSIS — E43 Unspecified severe protein-calorie malnutrition: Secondary | ICD-10-CM

## 2012-10-29 DIAGNOSIS — D649 Anemia, unspecified: Secondary | ICD-10-CM

## 2012-10-29 DIAGNOSIS — Z8543 Personal history of malignant neoplasm of ovary: Secondary | ICD-10-CM

## 2012-10-29 DIAGNOSIS — K660 Peritoneal adhesions (postprocedural) (postinfection): Secondary | ICD-10-CM | POA: Diagnosis present

## 2012-10-29 DIAGNOSIS — R509 Fever, unspecified: Secondary | ICD-10-CM | POA: Diagnosis not present

## 2012-10-29 DIAGNOSIS — E871 Hypo-osmolality and hyponatremia: Secondary | ICD-10-CM

## 2012-10-29 DIAGNOSIS — Z79899 Other long term (current) drug therapy: Secondary | ICD-10-CM

## 2012-10-29 DIAGNOSIS — E785 Hyperlipidemia, unspecified: Secondary | ICD-10-CM | POA: Diagnosis present

## 2012-10-29 DIAGNOSIS — J189 Pneumonia, unspecified organism: Secondary | ICD-10-CM

## 2012-10-29 DIAGNOSIS — Y95 Nosocomial condition: Secondary | ICD-10-CM

## 2012-10-29 DIAGNOSIS — F419 Anxiety disorder, unspecified: Secondary | ICD-10-CM

## 2012-10-29 DIAGNOSIS — Z87891 Personal history of nicotine dependence: Secondary | ICD-10-CM

## 2012-10-29 LAB — BASIC METABOLIC PANEL
BUN: 12 mg/dL (ref 6–23)
CO2: 29 mEq/L (ref 19–32)
Calcium: 9.7 mg/dL (ref 8.4–10.5)
Creatinine, Ser: 0.61 mg/dL (ref 0.50–1.10)
GFR calc non Af Amer: 89 mL/min — ABNORMAL LOW (ref >90)
Glucose, Bld: 188 mg/dL — ABNORMAL HIGH (ref 70–99)

## 2012-10-29 LAB — CBC
Hemoglobin: 12.2 g/dL (ref 12.0–15.0)
MCH: 32.2 pg (ref 26.0–34.0)
MCHC: 33.4 g/dL (ref 30.0–36.0)
MCV: 96.3 fL (ref 78.0–100.0)
RBC: 3.79 MIL/uL — ABNORMAL LOW (ref 3.87–5.11)

## 2012-10-29 MED ORDER — ALPRAZOLAM 0.25 MG PO TABS
0.2500 mg | ORAL_TABLET | Freq: Three times a day (TID) | ORAL | Status: DC | PRN
Start: 2012-10-29 — End: 2012-11-07
  Administered 2012-10-29 – 2012-11-02 (×11): 0.25 mg via ORAL
  Filled 2012-10-29 (×12): qty 1

## 2012-10-29 MED ORDER — ONDANSETRON HCL 4 MG PO TABS
4.0000 mg | ORAL_TABLET | Freq: Four times a day (QID) | ORAL | Status: DC | PRN
  Administered 2012-11-02: 4 mg via ORAL
  Filled 2012-10-29: qty 1

## 2012-10-29 MED ORDER — ALBUTEROL SULFATE (5 MG/ML) 0.5% IN NEBU: 2.5000 mg | INHALATION_SOLUTION | RESPIRATORY_TRACT | Status: DC | PRN

## 2012-10-29 MED ORDER — ALBUTEROL SULFATE (5 MG/ML) 0.5% IN NEBU
5.0000 mg | INHALATION_SOLUTION | Freq: Once | RESPIRATORY_TRACT | Status: AC
  Administered 2012-10-29: 5 mg via RESPIRATORY_TRACT
  Filled 2012-10-29: qty 1

## 2012-10-29 MED ORDER — ATORVASTATIN CALCIUM 80 MG PO TABS
80.0000 mg | ORAL_TABLET | Freq: Every day | ORAL | Status: DC
  Administered 2012-10-29 – 2012-11-02 (×5): 80 mg via ORAL
  Filled 2012-10-29 (×9): qty 1

## 2012-10-29 MED ORDER — LORAZEPAM 2 MG/ML IJ SOLN
1.0000 mg | Freq: Once | INTRAMUSCULAR | Status: AC
  Administered 2012-10-29: 1 mg via INTRAVENOUS
  Filled 2012-10-29: qty 1

## 2012-10-29 MED ORDER — TIOTROPIUM BROMIDE MONOHYDRATE 18 MCG IN CAPS
18.0000 ug | ORAL_CAPSULE | Freq: Every day | RESPIRATORY_TRACT | Status: DC
  Administered 2012-10-30 – 2012-11-05 (×7): 18 ug via RESPIRATORY_TRACT
  Filled 2012-10-29 (×2): qty 5

## 2012-10-29 MED ORDER — ASPIRIN EC 81 MG PO TBEC
81.0000 mg | DELAYED_RELEASE_TABLET | Freq: Every day | ORAL | Status: DC
  Administered 2012-10-29 – 2012-11-02 (×5): 81 mg via ORAL
  Filled 2012-10-29 (×9): qty 1

## 2012-10-29 MED ORDER — LEVALBUTEROL HCL 0.63 MG/3ML IN NEBU
0.6300 mg | INHALATION_SOLUTION | Freq: Four times a day (QID) | RESPIRATORY_TRACT | Status: DC
  Administered 2012-10-29 – 2012-11-03 (×21): 0.63 mg via RESPIRATORY_TRACT
  Filled 2012-10-29 (×23): qty 3

## 2012-10-29 MED ORDER — ALBUTEROL SULFATE (5 MG/ML) 0.5% IN NEBU
5.0000 mg | INHALATION_SOLUTION | Freq: Once | RESPIRATORY_TRACT | Status: AC
  Administered 2012-10-29: 5 mg via RESPIRATORY_TRACT

## 2012-10-29 MED ORDER — BUDESONIDE-FORMOTEROL FUMARATE 160-4.5 MCG/ACT IN AERO
2.0000 | INHALATION_SPRAY | Freq: Two times a day (BID) | RESPIRATORY_TRACT | Status: DC
  Administered 2012-10-29 – 2012-11-05 (×14): 2 via RESPIRATORY_TRACT
  Filled 2012-10-29: qty 6

## 2012-10-29 MED ORDER — POLYVINYL ALCOHOL 1.4 % OP SOLN
1.0000 [drp] | Freq: Every day | OPHTHALMIC | Status: DC | PRN
  Filled 2012-10-29: qty 15

## 2012-10-29 MED ORDER — ENOXAPARIN SODIUM 40 MG/0.4ML ~~LOC~~ SOLN
40.0000 mg | SUBCUTANEOUS | Status: DC
  Administered 2012-10-29: 40 mg via SUBCUTANEOUS
  Filled 2012-10-29 (×2): qty 0.4

## 2012-10-29 MED ORDER — LEVOFLOXACIN IN D5W 750 MG/150ML IV SOLN
750.0000 mg | INTRAVENOUS | Status: DC
  Administered 2012-10-29 – 2012-10-30 (×2): 750 mg via INTRAVENOUS
  Filled 2012-10-29 (×2): qty 150

## 2012-10-29 MED ORDER — IRBESARTAN 150 MG PO TABS
150.0000 mg | ORAL_TABLET | Freq: Every day | ORAL | Status: DC
  Administered 2012-10-30 – 2012-11-03 (×5): 150 mg via ORAL
  Filled 2012-10-29 (×8): qty 1

## 2012-10-29 MED ORDER — ONDANSETRON HCL 4 MG/2ML IJ SOLN
4.0000 mg | Freq: Four times a day (QID) | INTRAMUSCULAR | Status: DC | PRN
  Administered 2012-10-31 – 2012-11-29 (×22): 4 mg via INTRAVENOUS
  Filled 2012-10-29 (×22): qty 2

## 2012-10-29 MED ORDER — METHYLPREDNISOLONE SODIUM SUCC 125 MG IJ SOLR
60.0000 mg | Freq: Four times a day (QID) | INTRAMUSCULAR | Status: DC
  Administered 2012-10-29 – 2012-10-31 (×7): 60 mg via INTRAVENOUS
  Filled 2012-10-29 (×11): qty 0.96

## 2012-10-29 MED ORDER — IPRATROPIUM BROMIDE 0.02 % IN SOLN
0.5000 mg | Freq: Once | RESPIRATORY_TRACT | Status: AC
Start: 2012-10-29 — End: 2012-10-29
  Administered 2012-10-29: 0.5 mg via RESPIRATORY_TRACT
  Filled 2012-10-29: qty 2.5

## 2012-10-29 MED ORDER — ALBUTEROL SULFATE (5 MG/ML) 0.5% IN NEBU
5.0000 mg | INHALATION_SOLUTION | RESPIRATORY_TRACT | Status: DC
  Administered 2012-10-29: 5 mg via RESPIRATORY_TRACT
  Filled 2012-10-29 (×2): qty 0.5

## 2012-10-29 MED ORDER — LEVALBUTEROL HCL 0.63 MG/3ML IN NEBU
0.6300 mg | INHALATION_SOLUTION | Freq: Four times a day (QID) | RESPIRATORY_TRACT | Status: DC | PRN
  Filled 2012-10-29: qty 3

## 2012-10-29 MED ORDER — ALBUTEROL SULFATE HFA 108 (90 BASE) MCG/ACT IN AERS
2.0000 | INHALATION_SPRAY | Freq: Four times a day (QID) | RESPIRATORY_TRACT | Status: DC | PRN
  Filled 2012-10-29: qty 6.7

## 2012-10-29 MED ORDER — IPRATROPIUM BROMIDE 0.02 % IN SOLN
0.5000 mg | Freq: Once | RESPIRATORY_TRACT | Status: AC
  Administered 2012-10-29: 0.5 mg via RESPIRATORY_TRACT
  Filled 2012-10-29: qty 2.5

## 2012-10-29 MED ORDER — ALBUTEROL SULFATE (5 MG/ML) 0.5% IN NEBU
5.0000 mg | INHALATION_SOLUTION | Freq: Once | RESPIRATORY_TRACT | Status: AC
  Administered 2012-10-29: 5 mg via RESPIRATORY_TRACT
  Filled 2012-10-29 (×2): qty 0.5

## 2012-10-29 MED ORDER — ACETAMINOPHEN 325 MG PO TABS
650.0000 mg | ORAL_TABLET | Freq: Four times a day (QID) | ORAL | Status: DC | PRN
  Administered 2012-11-01: 650 mg via ORAL
  Filled 2012-10-29: qty 2

## 2012-10-29 NOTE — Telephone Encounter (Signed)
[id number] x1 

## 2012-10-29 NOTE — ED Provider Notes (Signed)
History     CSN: [id number]  Arrival date & time 10/29/12  4098   First MD Initiated Contact with Patient 10/29/12 [phone]      Chief Complaint  Patient presents with  . Shortness of Breath    (Consider location/radiation/quality/duration/timing/severity/associated sxs/prior treatment) HPI Pt presents with c/o shortness of breath.  She has hx of COPD and feels that she is having an exacerbation.  Symptoms began to worsen last night. She was called in prescription for prednisone last night and took one dose.  Has used albuterol x 2 today.  Also given solumedrol IV by EMS.  No fever/chills.  Cough is nonproductive.  No chest pain or leg swelling.  There are no other associated systemic symptoms, there are no other alleviating or modifying factors.   Past Medical History  Diagnosis Date  . Emphysema   . 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 Hyperlipidemia   . Hypertension     Past Surgical History  Procedure Date  . Abdominal hysterectomy     Family History  Problem Relation Age of Onset  . Heart failure Mother   . Heart attack Father   . Heart attack Paternal Grandmother     History  Substance Use Topics  . Smoking status: Former Smoker -- 1.0 packs/day for 30 years    Types: Cigarettes    Quit date: 11/06/1992  . Smokeless tobacco: Never Used  . Alcohol Use: No    OB History    Grav Para Term Preterm Abortions TAB SAB Ect Mult Living                  Review of Systems ROS reviewed and all otherwise negative except for mentioned in HPI  Allergies  Oxycodone-acetaminophen  Home Medications   No current outpatient prescriptions on file.  BP 134/73  Pulse 91  Temp 97.7 F (36.5 C) (Oral)  Resp 20  Ht 5\' 5"  (1.651 m)  Wt 94 lb 12.8 oz (43 kg)  BMI 15.78 kg/m2  SpO2 95% Vitals reviewed Physical Exam Physical Examination: General appearance - alert, well appearing, and in no distress Mental status - alert, oriented to person, place, and time Eyes - no  conjunctival injection, no scleral icterus Mouth - mucous membranes moist, pharynx normal without lesions Chest - decreased air movement, bilateral expiratory wheezing, increased work of breathing, speaking in 2-3 word sentences Heart - tachycardic, regular rhythm, normal S1, S2, no murmurs, rubs, clicks or gallops Abdomen - soft, nontender, nondistended, no masses or organomegaly Extremities - peripheral pulses normal, no pedal edema, no clubbing or cyanosis Skin - normal coloration and turgor, no rashes  ED Course  Procedures (including critical care time)   Date: 10/29/2012  Rate: 112  Rhythm: sinus tachycardia  QRS Axis: normal  Intervals: normal  ST/T Wave abnormalities: nonspecific ST/T changes  Conduction Disutrbances:none  Narrative Interpretation:   Old EKG Reviewed: unchanged  12:19 PM d/w [facility] for admission, pt to be admitted to observation, team 3, telemetry, holding orders written    Labs Reviewed  CBC - Abnormal; Notable for the following:    RBC 3.79 (*)     All other components within normal limits  BASIC METABOLIC PANEL - Abnormal; Notable for the following:    Glucose, Bld 188 (*)     GFR calc non Af Amer 89 (*)     All other components within normal limits  PRO B NATRIURETIC PEPTIDE - Abnormal; Notable for the following:    Pro B  Natriuretic peptide (BNP) 352.9 (*)     All other components within normal limits  HEMOGLOBIN A1C - Abnormal; Notable for the following:    Hemoglobin A1C 5.9 (*)     Mean Plasma Glucose 123 (*)     All other components within normal limits  BASIC METABOLIC PANEL - Abnormal; Notable for the following:    Sodium 125 (*)     Chloride 90 (*)     Glucose, Bld 125 (*)     GFR calc non Af Amer 86 (*)     All other components within normal limits  CBC - Abnormal; Notable for the following:    RBC 3.15 (*)     Hemoglobin 9.9 (*)     HCT 29.8 (*)     All other components within normal limits  TSH  TROPONIN I  TROPONIN I   TROPONIN I   Dg Chest 2 View  10/29/2012  *RADIOLOGY REPORT*  Clinical Data: Shortness of breath  CHEST - 2 VIEW  Comparison: 08/10/2008  Findings: Cardiomediastinal silhouette is stable.  No acute infiltrate or pleural effusion.  No pulmonary edema.  Mild hyperinflation again noted.  Thoracic spine osteopenia.  Mild degenerative changes lower thoracic spine.  IMPRESSION:  No active disease.  Mild hyperinflation again noted.   Original Report Authenticated By: Natasha Mead, M.D.      1. EMPHYSEMA   2. Anxiety   3. COPD with acute exacerbation   4. SOB (shortness of breath) on exertion       MDM  Pt with hx of COPD on home O2 presents with c/o increased shortness of breath.  Presentation most c/w COPD exacerbation, doubt PE or fluid overload, or pneumonia.  Pt given albuterol/atrovent nebs in ED x 3, also had received solumedrol IV x 1 by EMS, given levofloxacin IV as well.  Admitted to [facility] for further management.          Ethelda Chick, MD 10/30/12 [phone]

## 2012-10-29 NOTE — Telephone Encounter (Signed)
Please let daughter know that I am off for the next one week, but her admitting md's can call us if they need assistance

## 2012-10-29 NOTE — Care Management Note (Unsigned)
    Page 1 of 2   11/05/2012     5:01:06 PM   CARE MANAGEMENT NOTE 11/05/2012  [name], [name]   Account Number:  [id number]  Date Initiated:  10/29/2012  Documentation initiated by:  [facility]  Subjective/Objective Assessment:   ADMITTED W/SOB.RU:EAVW,UJW.     Action/Plan:   FROM HOME,ALONE.HAS PCP-DR. KEVIN LITTLE,PULMONOLOGIST-DR. CLANCE.PHARMACY-[facility],[place].HOME 02-[facility]-HAS TRAVEL TANK.   Anticipated DC Date:  11/08/2012   Anticipated DC Plan:  HOME W HOME HEALTH SERVICES      DC Planning Services  CM consult      [facility] Choice  HOME HEALTH   Choice offered to / List presented to:  C-1 Patient        HH arranged  HH-1 RN  HH-2 PT  HH-3 OT  HH-4 NURSE'S AIDE      Status of service:  Completed, signed off Medicare Important Message given?   (If response is "NO", the following Medicare IM given date fields will be blank) Date Medicare IM given:   Date Additional Medicare IM given:    Discharge Disposition:  HOME W HOME HEALTH SERVICES  Per UR Regulation:  Reviewed for med. necessity/level of care/duration of stay  If discussed at Long Length of Stay Meetings, dates discussed:   11/05/2012    Comments:  11/05/12 [name] RN,BSN NCM [phone] N/V,NGT.ABD XRAY.[facility] FOLLOWING FOR HHC.  11/04/12 [name] RN,BSN NCM [phone] SX FOLLOWING-SBO-NGT,IV ZOFRAN IV,BOWEL REST.COPD-IMPROVING-SOLUMEDROL IV.D/C PLAN HOME W/HH.[facility] SUSAN(LIASON) FOLLOWING FOR D/C,ALREADY HAVE HHC ORDERS-HHRN/PT/OT/AIDE.  11-01-12 [name] RN CM 1200 Spoke with patient at bedside regarding referral for [facility] needs. Patient also requesting information about private duty caregivers. Patient requested to use [facility] for skilled services. Contacted Darl Pikes with [facility] to arrange. Provided list of private duty caregivers to patient. Patient states has some family support but limited, she is trying to coordination time. Concerns about insurance coverage discussed, referred her to  finance office for questions.  10/29/12 [name] RN,BSN NCM [phone]

## 2012-10-29 NOTE — ED Notes (Signed)
ZOX:[id number]<AV> Expected date:10/29/12<BR> Expected time: 8:52 AM<BR> Means of arrival:Ambulance<BR> Comments:<BR> Elderly/SOB/hx COPD

## 2012-10-29 NOTE — Telephone Encounter (Signed)
I called and spoke with pt daughter-in-law and she states that had to call the ambulance this morning and the pt has been admitted to [facility] hospital. I will forward to [facility] as an FYI. Carron Curie, CMA

## 2012-10-29 NOTE — ED Notes (Signed)
Per EMS: Pt w/ hx of COPD.  C/o SOB since last night.  Not a current smoker.  Called her doctor and got a prednisone rx.  Took 1 last night.  Prior to EMS arrival, pt took a home neb.  En route pt received 125 solumedrol IV, 1 albuterol/atrovent neb.  Pt on 2 L at home.  Nonproductive cough.

## 2012-10-29 NOTE — Progress Notes (Signed)
Patient is very anxious about her breathing. Has paged me to the room twice to check her oxygen saturation which was 95 % and 97% on 2L of oxygen. Patient also stated that she had a difficult time swallowing her food, felt like her throat was swollen-but is able to swallow liquids. Patient has a lot of anxiety about being in the hospital for the first time in 4 years. Notified Dr. Blake Divine. Will give 1mg  ativan IV for anxiety per order. Will continue to monitor. Erskin Burnet RN

## 2012-10-29 NOTE — H&P (Signed)
[facility] History and Physical  [name]:[id number] DOB: 08/20/1941 DOA: 10/29/2012   PCP: Mickie Hillier, MD  Specialists: Dr Shelle Iron  Chief Complaint: shortness of breath since 2 days.   HPI: [name] is a 71 y.o. female with h/o end stage COPD on oxygen, came in for worsening sob, with non productive cough. On arrival she received multiple neb treatments and IV solumedrol. She improved a little but has persistent chest tightness. She is being admitted for copd exacerbation.   Review of Systems: The patient denies anorexia, fever, weight loss,, vision loss, decreased hearing, hoarseness, chest pain, syncope,  peripheral edema, balance deficits, hemoptysis, abdominal pain, melena, hematochezia, severe indigestion/heartburn, hematuria, incontinence, genital sores, muscle weakness, suspicious skin lesions, transient blindness, difficulty walking, depression, unusual weight change, abnormal bleeding, enlarged lymph nodes, angioedema, and breast masses.    Past Medical History  Diagnosis Date  . Emphysema   . 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 Hyperlipidemia   . Hypertension    Past Surgical History  Procedure Date  . Abdominal hysterectomy    Social History:  reports that she quit smoking about 19 years ago. Her smoking use included Cigarettes. She has a 30 pack-year smoking history. She has never used smokeless tobacco. She reports that she does not drink alcohol or use illicit drugs. where does patient live--home, Allergies  Allergen Reactions  . Oxycodone-Acetaminophen     REACTION: headaches,nausea    Family History  Problem Relation Age of Onset  . Heart failure Mother   . Heart attack Father   . Heart attack Paternal Grandmother    Prior to Admission medications   Medication Sig Start Date End Date Taking? Authorizing Provider  albuterol (PROAIR HFA) 108 (90 BASE) MCG/ACT inhaler Inhale 2 puffs into the lungs every 6 (six) hours as needed. 06/19/12   Yes Barbaraann Share, MD  albuterol (PROVENTIL) (2.5 MG/3ML) 0.083% nebulizer solution Take 3 mLs (2.5 mg total) by nebulization every 6 (six) hours as needed. Dx: 492.8 12/08/11 12/07/12 Yes Barbaraann Share, MD  ALPRAZolam Prudy Feeler) 0.25 MG tablet Take 0.25 mg by mouth at bedtime as needed.    Yes Historical Provider, MD  aspirin 81 MG tablet Take 81 mg by mouth daily.     Yes Historical Provider, MD  atorvastatin (LIPITOR) 80 MG tablet Take 80 mg by mouth daily.     Yes Historical Provider, MD  BENICAR 40 MG tablet Take 1 tablet by mouth daily.   Yes Historical Provider, MD  budesonide-formoterol (SYMBICORT) 160-4.5 MCG/ACT inhaler Inhale 2 puffs into the lungs 2 (two) times daily. 06/19/12  Yes Barbaraann Share, MD  Calcium Carbonate (CALTRATE 600) 1500 MG TABS Take 1 tablet by mouth 2 (two) times daily.     Yes Historical Provider, MD  Lutein 20 MG CAPS Take 1 capsule by mouth daily.     Yes Historical Provider, MD  Multiple Vitamin (ANTIOXIDANT PO) Take 1 tablet by mouth daily.     Yes Historical Provider, MD  Omega-3 Fatty Acids (FISH OIL) 1000 MG CAPS Take 1 capsule by mouth 2 (two) times daily.     Yes Historical Provider, MD  Polyethyl Glycol-Propyl Glycol (SYSTANE) 0.4-0.3 % SOLN Apply 1 drop to eye daily as needed. dryness   Yes Historical Provider, MD  predniSONE (DELTASONE) 10 MG tablet Take 40 mg by mouth daily. Take 4 for two days three for two days two for two days one for two days. Pt is on day 2  of therapy 10/28/12  Yes Storm Frisk, MD  risedronate (ACTONEL) 150 MG tablet Take 150 mg by mouth every 30 (thirty) days. with water on empty stomach, nothing by mouth or lie down for next 30 minutes.    Yes Historical Provider, MD  sodium chloride (MURO 128) 5 % ophthalmic solution Place 1 drop into the left eye as needed. Dry eyes   Yes Historical Provider, MD  tiotropium (SPIRIVA) 18 MCG inhalation capsule Place 1 capsule (18 mcg total) into inhaler and inhale daily. 06/19/12  Yes Barbaraann Share,  MD   Physical Exam: Filed Vitals:   10/29/12 1116 10/29/12 1125 10/29/12 1321 10/29/12 1410  BP:  120/71  114/53  Pulse:  110  67  Temp:  98.2 F (36.8 C)  98.2 F (36.8 C)  TempSrc:  Oral  Oral  Resp:  20  18  Height:    5\' 5"  (1.651 m)  Weight:    43 kg (94 lb 12.8 oz)  SpO2: 95% 98% 97% 95%    Constitutional: Vital signs reviewed.  Patient is a well-developed and poorly nourished in no acute distress and cooperative with exam. Alert and oriented x3.  Head: Normocephalic and atraumatic Mouth: no erythema or exudates, MMM Eyes: PERRL, EOMI, conjunctivae normal, No scleral icterus.  Neck: Supple, Trachea midline normal ROM, No JVD, mass, thyromegaly, or carotid bruit present.  Cardiovascular: RRR, S1 normal, S2 normal, no MRG, pulses symmetric and intact bilaterally Pulmonary/Chest: decreased air entry bilateral scattered bil wheezing.  Abdominal: Soft. Non-tender, non-distended, bowel sounds are normal, no masses, organomegaly, or guarding present.  Musculoskeletal: No joint deformities, erythema, or stiffness, ROM full and no nontender Neurological: A&O x3, Strength is normal and symmetric bilaterally, cranial nerve II-XII are grossly intact, no focal motor deficit, . Skin: Warm, dry and intact. No rash, cyanosis, or clubbing.  Psychiatric: Normal mood and affect. speech and behavior is normal. Judgment and thought content normal.  Labs on Admission:  Basic Metabolic Panel:  Lab 10/29/12 1478  NA 138  K 4.1  CL 98  CO2 29  GLUCOSE 188*  BUN 12  CREATININE 0.61  CALCIUM 9.7  MG --  PHOS --   Liver Function Tests: No results found for this basename: AST:5,ALT:5,ALKPHOS:5,BILITOT:5,PROT:5,ALBUMIN:5 in the last 168 hours No results found for this basename: LIPASE:5,AMYLASE:5 in the last 168 hours No results found for this basename: AMMONIA:5 in the last 168 hours CBC:  Lab 10/29/12 0935  WBC 6.5  NEUTROABS --  HGB 12.2  HCT 36.5  MCV 96.3  PLT 242   Cardiac  Enzymes: No results found for this basename: CKTOTAL:5,CKMB:5,CKMBINDEX:5,TROPONINI:5 in the last 168 hours  BNP (last 3 results)  Basename 10/29/12 0935  PROBNP 352.9*   CBG: No results found for this basename: GLUCAP:5 in the last 168 hours  Radiological Exams on Admission: Dg Chest 2 View  10/29/2012  *RADIOLOGY REPORT*  Clinical Data: Shortness of breath  CHEST - 2 VIEW  Comparison: 08/10/2008  Findings: Cardiomediastinal silhouette is stable.  No acute infiltrate or pleural effusion.  No pulmonary edema.  Mild hyperinflation again noted.  Thoracic spine osteopenia.  Mild degenerative changes lower thoracic spine.  IMPRESSION:  No active disease.  Mild hyperinflation again noted.   Original Report Authenticated By: Natasha Mead, M.D.     EKG: sinus tachy  Assessment/Plan Active Problems:    1. Copd exacerbation: - admit to tele - IV steroids, nebs, levaquin and oxygen . - cardiac enzymes are ordered.  2. Anxiety : xanax prn  3. Hypertension: controlled.   Code Status: full code Family Communication: family in [place]  Disposition Plan: in 2 to 3 days.     [facility] Pager [phone]  If 7PM-7AM, please contact night-coverage [email or web address] Password [facility] 10/29/2012, 2:47 PM

## 2012-10-30 DIAGNOSIS — E871 Hypo-osmolality and hyponatremia: Secondary | ICD-10-CM | POA: Insufficient documentation

## 2012-10-30 DIAGNOSIS — D649 Anemia, unspecified: Secondary | ICD-10-CM | POA: Diagnosis present

## 2012-10-30 LAB — CBC
Hemoglobin: 9.9 g/dL — ABNORMAL LOW (ref 12.0–15.0)
MCH: 32.1 pg (ref 26.0–34.0)
RBC: 3.15 MIL/uL — ABNORMAL LOW (ref 3.87–5.11)

## 2012-10-30 LAB — RETICULOCYTES
RBC.: 3.35 MIL/uL — ABNORMAL LOW (ref 3.87–5.11)
Retic Count, Absolute: 67 10*3/uL (ref 19.0–186.0)
Retic Ct Pct: 2 % (ref 0.4–3.1)

## 2012-10-30 LAB — TROPONIN I: Troponin I: <0.3 ng/mL (ref <0.30)

## 2012-10-30 LAB — OSMOLALITY: Osmolality: 268 mOsm/kg — ABNORMAL LOW (ref 275–300)

## 2012-10-30 LAB — SODIUM, URINE, RANDOM: Sodium, Ur: 10 mEq/L

## 2012-10-30 LAB — BASIC METABOLIC PANEL
CO2: 28 mEq/L (ref 19–32)
GFR calc non Af Amer: 86 mL/min — ABNORMAL LOW (ref >90)
Glucose, Bld: 125 mg/dL — ABNORMAL HIGH (ref 70–99)
Potassium: 4.4 mEq/L (ref 3.5–5.1)
Sodium: 125 mEq/L — ABNORMAL LOW (ref 135–145)

## 2012-10-30 LAB — OSMOLALITY, URINE: Osmolality, Ur: 123 mOsm/kg — ABNORMAL LOW (ref 390–1090)

## 2012-10-30 LAB — VITAMIN B12: Vitamin B-12: 785 pg/mL (ref 211–911)

## 2012-10-30 LAB — FOLATE: Folate: >20 ng/mL

## 2012-10-30 MED ORDER — LEVOFLOXACIN IN D5W 750 MG/150ML IV SOLN: 750.0000 mg | INTRAVENOUS | Status: DC

## 2012-10-30 MED ORDER — SODIUM CHLORIDE 0.9 % IV SOLN
INTRAVENOUS | Status: DC
  Administered 2012-10-30 – 2012-10-31 (×2): via INTRAVENOUS

## 2012-10-30 MED ORDER — ENOXAPARIN SODIUM 30 MG/0.3ML ~~LOC~~ SOLN
30.0000 mg | SUBCUTANEOUS | Status: DC
  Administered 2012-10-30 – 2012-11-06 (×8): 30 mg via SUBCUTANEOUS
  Filled 2012-10-30 (×9): qty 0.3

## 2012-10-30 NOTE — Progress Notes (Signed)
[facility] PROGRESS NOTE  [name] WUJ:[id number] DOB: [birth year] DOA: 10/29/2012 PCP: Mickie Hillier, MD  Assessment/Plan: COPD EXACERBATION; impproving with IV steroids Continue with Q4hr nebs, oxygen, and levaquin.  Continue with symbicort and spiriva.   2. Anxiety: continue with xanax tid prn.   3. HYponatremia: unclear etiology. Probably from dehydration. Will give her IV fluids and check sodium in am.   4. Anemia: normocytic. Anemia panel will be ordered. No obvious source of bleeding.   DVT prophylaxis.       Antibiotics:  levaquin  From 12 /24 >>>  HPI/Subjective: NO NEW ISSUES, ver anxious .   Objective: Filed Vitals:   10/29/12 2157 10/30/12 0222 10/30/12 0702 10/30/12 0840  BP:   134/73   Pulse:   91   Temp:   97.7 F (36.5 C)   TempSrc:   Oral   Resp:   20   Height:      Weight:      SpO2: 97% 97% 95% 95%   No intake or output data in the 24 hours ending 10/30/12 0842 Filed Weights   10/29/12 1410  Weight: 43 kg (94 lb 12.8 oz)    Exam:   General:  Alert afebrile very anxious  Cardiovascular: s1s2  Respiratory: scattered wheezing on the right side  Abdomen: soft NT ND BS+  Data Reviewed: Basic Metabolic Panel:  Lab 10/30/12 9562 10/29/12 0935  NA 125* 138  K 4.4 4.1  CL 90* 98  CO2 28 29  GLUCOSE 125* 188*  BUN 16 12  CREATININE 0.69 0.61  CALCIUM 9.4 9.7  MG -- --  PHOS -- --   Liver Function Tests: No results found for this basename: AST:5,ALT:5,ALKPHOS:5,BILITOT:5,PROT:5,ALBUMIN:5 in the last 168 hours No results found for this basename: LIPASE:5,AMYLASE:5 in the last 168 hours No results found for this basename: AMMONIA:5 in the last 168 hours CBC:  Lab 10/30/12 0330 10/29/12 0935  WBC 8.0 6.5  NEUTROABS -- --  HGB 9.9* 12.2  HCT 29.8* 36.5  MCV 94.6 96.3  PLT 209 242   Cardiac Enzymes:  Lab 10/30/12 0330 10/29/12 2109 10/29/12 1500  CKTOTAL -- -- --  CKMB -- -- --  CKMBINDEX -- -- --   TROPONINI <0.30 <0.30 <0.30   BNP (last 3 results)  Basename 10/29/12 0935  PROBNP 352.9*   CBG: No results found for this basename: GLUCAP:5 in the last 168 hours  No results found for this or any previous visit (from the past 240 hour(s)).   Studies: Dg Chest 2 View  10/29/2012  *RADIOLOGY REPORT*  Clinical Data: Shortness of breath  CHEST - 2 VIEW  Comparison: 08/10/2008  Findings: Cardiomediastinal silhouette is stable.  No acute infiltrate or pleural effusion.  No pulmonary edema.  Mild hyperinflation again noted.  Thoracic spine osteopenia.  Mild degenerative changes lower thoracic spine.  IMPRESSION:  No active disease.  Mild hyperinflation again noted.   Original Report Authenticated By: Natasha Mead, M.D.     Scheduled Meds:   . aspirin EC  81 mg Oral Daily  . atorvastatin  80 mg Oral Daily  . budesonide-formoterol  2 puff Inhalation BID  . enoxaparin (LOVENOX) injection  40 mg Subcutaneous Q24H  . irbesartan  150 mg Oral Daily  . levalbuterol  0.63 mg Nebulization Q6H  . levofloxacin  750 mg Intravenous Q24H  . methylPREDNISolone (SOLU-MEDROL) injection  60 mg Intravenous Q6H  . tiotropium  18 mcg Inhalation Daily   Continuous Infusions:   Active  Problems:  COPD with acute exacerbation  Anxiety  SOB (shortness of breath) on exertion        [facility]  [facility] Pager [phone]. If 8PM-8AM, please contact night-coverage at [email or web address], password [facility] 10/30/2012, 8:42 AM  LOS: 1 day

## 2012-10-31 ENCOUNTER — Encounter (HOSPITAL_COMMUNITY): Payer: Medicare Other

## 2012-10-31 DIAGNOSIS — E871 Hypo-osmolality and hyponatremia: Secondary | ICD-10-CM

## 2012-10-31 DIAGNOSIS — D649 Anemia, unspecified: Secondary | ICD-10-CM

## 2012-10-31 LAB — CBC
HCT: 32.1 % — ABNORMAL LOW (ref 36.0–46.0)
MCHC: 34.6 g/dL (ref 30.0–36.0)
Platelets: 226 10*3/uL (ref 150–400)
RDW: 12.7 % (ref 11.5–15.5)
WBC: 12.5 10*3/uL — ABNORMAL HIGH (ref 4.0–10.5)

## 2012-10-31 LAB — BASIC METABOLIC PANEL
BUN: 23 mg/dL (ref 6–23)
Calcium: 8.6 mg/dL (ref 8.4–10.5)
Chloride: 93 mEq/L — ABNORMAL LOW (ref 96–112)
Creatinine, Ser: 0.63 mg/dL (ref 0.50–1.10)
GFR calc Af Amer: >90 mL/min (ref >90)
GFR calc non Af Amer: 88 mL/min — ABNORMAL LOW (ref >90)

## 2012-10-31 MED ORDER — METHYLPREDNISOLONE SODIUM SUCC 125 MG IJ SOLR
60.0000 mg | Freq: Two times a day (BID) | INTRAMUSCULAR | Status: DC
  Filled 2012-10-31 (×2): qty 0.96

## 2012-10-31 MED ORDER — PROMETHAZINE HCL 25 MG/ML IJ SOLN
12.5000 mg | Freq: Four times a day (QID) | INTRAMUSCULAR | Status: DC | PRN
  Administered 2012-10-31: 25 mg via INTRAVENOUS
  Filled 2012-10-31: qty 1

## 2012-10-31 MED ORDER — PANTOPRAZOLE SODIUM 40 MG PO TBEC
40.0000 mg | DELAYED_RELEASE_TABLET | Freq: Every day | ORAL | Status: DC
  Administered 2012-11-01 – 2012-11-02 (×2): 40 mg via ORAL
  Filled 2012-10-31 (×7): qty 1

## 2012-10-31 MED ORDER — METHYLPREDNISOLONE SODIUM SUCC 125 MG IJ SOLR
60.0000 mg | Freq: Three times a day (TID) | INTRAMUSCULAR | Status: DC
  Administered 2012-10-31 – 2012-11-01 (×3): 60 mg via INTRAVENOUS
  Filled 2012-10-31 (×6): qty 0.96

## 2012-10-31 MED ORDER — SENNOSIDES-DOCUSATE SODIUM 8.6-50 MG PO TABS
1.0000 | ORAL_TABLET | Freq: Two times a day (BID) | ORAL | Status: DC
  Administered 2012-10-31 – 2012-11-02 (×5): 1 via ORAL
  Filled 2012-10-31 (×14): qty 1

## 2012-10-31 MED ORDER — LEVOFLOXACIN 750 MG PO TABS
750.0000 mg | ORAL_TABLET | ORAL | Status: DC
  Administered 2012-11-01: 750 mg via ORAL
  Filled 2012-10-31 (×2): qty 1

## 2012-10-31 MED ORDER — FERROUS SULFATE 325 (65 FE) MG PO TABS
325.0000 mg | ORAL_TABLET | Freq: Two times a day (BID) | ORAL | Status: DC
  Administered 2012-10-31 – 2012-11-02 (×4): 325 mg via ORAL
  Filled 2012-10-31 (×6): qty 1

## 2012-10-31 NOTE — Clinical Documentation Improvement (Signed)
    MALNUTRITION DOCUMENTATION CLARIFICATION  THIS DOCUMENT IS NOT A PERMANENT PART OF THE MEDICAL RECORD  TO RESPOND TO THE THIS QUERY, FOLLOW THE INSTRUCTIONS BELOW:  1. If needed, update documentation for the patient's encounter via the notes activity.  2. Access this query again and click edit on the In [employer].  3. After updating, or not, click F2 to complete all highlighted (required) fields concerning your review. Select "additional documentation in the medical record" OR "no additional documentation provided".  4. Click Sign note button.  5. The deficiency will fall out of your In Basket *Please let us know if you are not able to complete this workflow by phone or e-mail (listed below).  Please update your documentation within the medical record to reflect your response to this query.                                                                                        10/31/12   Dear Dr. Blake Divine, [facility],  In a better effort to capture your patient's severity of illness, reflect appropriate length of stay and utilization of resources, a review of the patient medical record has revealed the following indicators.    Based on your clinical judgment, please clarify and document in a progress note and/or discharge summary the clinical condition associated with the following supporting information:  In responding to this query please exercise your independent judgment.  The fact that a query is asked, does not imply that any particular answer is desired or expected.  Pt's BMI=  15.78                       Please clarify whether or not BMI can be linked to one of he diagnoses listed below and document in pn  and d/c. Thank You!  [facility]: When linking BMI to a diagnosis please document both BMI and diagnosis together in pn for accuracy of severity of illness (SOI) and risk of mortality (ROM).    Possible Clinical Conditions?  _______Mild Malnutrition    _______Moderate Malnutrition _______Severe Malnutrition   _______Protein Calorie Malnutrition _______Severe Protein Calorie Malnutrition  _______Other Condition________________ _______Cannot clinically determine     Supporting Information: Risk Factors: Anemia, COPD excerbation, hyponatremia  Signs & Symptoms:  HT 5\' 5"  (1.651 m)  WT= 94 lb 12.8 oz (43 kg)   BMI=15.78 kg/m2   Diagnostics:  Component     Latest Ref Rng 10/30/2012 10/31/2012           Sodium     135 - 145 mEq/L 125 (L) 127 (L)    Treatment  SLP clinical swallow evalution Nutritional consult order per nurse Melissa Intake and output Reg diet   You may use possible, probable, or suspect with inpatient documentation. possible, probable, suspected diagnoses MUST be documented at the time of discharge  Reviewed:  no additional documentation provided ljh  Thank You,  Enis Slipper  RN, BSN, MSN/Inf, CCDS Clinical Documentation Specialist [facility] Dept Pager: [phone] / E-mail: [email or web address]  Health Information Management [place]

## 2012-10-31 NOTE — Evaluation (Signed)
Physical Therapy Evaluation Patient Details Name: [name] MRN: [id number] DOB: 1941-09-01 Today's Date: 10/31/2012 Time: 4696-2952 PT Time Calculation (min): 19 min  PT Assessment / Plan / Recommendation Clinical Impression  Pt presents with COPD exacerbation.  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  Tolerated OOB and ambulation in hallway with RW and on 2L O2 with SaO2 at 96% prior to amb and 92% following amb.  Noted that pt is anxious about oxygen with whole body tremors (not so much when ambulating).  Pt will benefit from skilled PT in acute venue to address deficits.  PT recommends HHPT for follow up at D/C to maximize pts independence.     PT Assessment  Patient needs continued PT services    Follow Up Recommendations  Home health PT    Does the patient have the potential to tolerate intense rehabilitation      Barriers to Discharge Decreased caregiver support has intermittent assist from family/neighbors, however states that she would like to have an aide come in to help her.     Equipment Recommendations  Other (comment) (TBD, may benefit from rollator if O2 tank will fit)    Recommendations for Other Services     Frequency Min 3X/week    Precautions / Restrictions Precautions Precautions: Fall Precaution Comments: wears 2L O2 chronically, monitor O2 sats.  Restrictions Weight Bearing Restrictions: No   Pertinent Vitals/Pain No pain, did mention some nausea      Mobility  Bed Mobility Bed Mobility: Supine to Sit;Sit to Supine Supine to Sit: 3: Mod assist Sit to Supine: 4: Min guard Details for Bed Mobility Assistance: Assist for trunk when getting to EOB with cues for hand placement and technique with self assist.  Transfers Transfers: Sit to Stand;Stand to Sit Sit to Stand: 4: Min guard;With upper extremity assist;From bed Stand to Sit: 4: Min guard;With upper extremity assist;To bed Details for Transfer Assistance: Min/guard for  safety with cues for hand placement, safety and ensuring controlled descent when sitting.  Ambulation/Gait Ambulation/Gait Assistance: 4: Min assist Ambulation Distance (Feet): 65 Feet (x 2 reps) Assistive device: Rolling Barbeau Ambulation/Gait Assistance Details: Cues for taking rest breaks as needed.  Required one standing rest break against wall.  Ambulated on 2L O2 with O2 sats at 96% prior to ambulation and 92% following ambulation. Noted pt to be very anxious about oxygen throughout session and had some whole body tremors.  Gait Pattern: Step-through pattern;Decreased stride length Gait velocity: Pt with somewhat increased gait speed.  Cues for decreased speed for increased energy conservation.  Stairs: No Wheelchair Mobility Wheelchair Mobility: No    Shoulder Instructions     Exercises     PT Diagnosis: Difficulty walking;Generalized weakness  PT Problem List: Decreased strength;Decreased activity tolerance;Decreased balance;Decreased mobility;Decreased knowledge of use of DME;Cardiopulmonary status limiting activity;Decreased safety awareness PT Treatment Interventions: DME instruction;Gait training;Stair training;Functional mobility training;Therapeutic activities;Therapeutic exercise;Balance training;Patient/family education   PT Goals Acute Rehab PT Goals PT Goal Formulation: With patient Time For Goal Achievement: 11/14/12 Potential to Achieve Goals: Good Pt will go Supine/Side to Sit: with modified independence PT Goal: Supine/Side to Sit - Progress: Goal set today Pt will go Sit to Stand: with modified independence PT Goal: Sit to Stand - Progress: Goal set today Pt will go Stand to Sit: with modified independence PT Goal: Stand to Sit - Progress: Goal set today Pt will Ambulate: 51 - 150 feet;with modified independence;with least restrictive assistive device PT Goal: Ambulate - Progress:  Goal set today Pt will Go Up / Down Stairs: 1-2 stairs;with modified  independence;with least restrictive assistive device PT Goal: Up/Down Stairs - Progress: Goal set today  Visit Information  Last PT Received On: 10/31/12 Assistance Needed: +1 PT/OT Co-Evaluation/Treatment: Yes    Subjective Data  Subjective: I was trying to get some sleep b/c I woke up nauseated early this morning.  Patient Stated Goal: to return home with some help.    Prior Functioning  Home Living Lives With: Alone Available Help at Discharge: Friend(s) Type of Home: House Home Access: Stairs to enter [employer] of Steps: 1 Home Layout: One level Bathroom Shower/Tub: Tub/shower unit (sits at bottom of tub) Bathroom Toilet: Standard Home Adaptive Equipment: None Prior Function Level of Independence: Independent Driving: Yes Comments: Can't do housework.  Does grocery shop and meal prep.  Communication Communication: No difficulties    Cognition  Overall Cognitive Status: Appears within functional limits for tasks assessed/performed Arousal/Alertness: Awake/alert Orientation Level: Appears intact for tasks assessed Behavior During Session: Anxious Cognition - Other Comments: Pt anxious about breathing and O2 sats during session.     Extremity/Trunk Assessment Right Lower Extremity Assessment RLE ROM/Strength/Tone: [facility] for tasks assessed Left Lower Extremity Assessment LLE ROM/Strength/Tone: [facility] for tasks assessed Trunk Assessment Trunk Assessment: Kyphotic;Other exceptions Trunk Exceptions: Pt with elevated shoulders due to accessory muscle use when breathing.    Balance Balance Balance Assessed: Yes Dynamic Standing Balance Dynamic Standing - Balance Support: During functional activity Dynamic Standing - Level of Assistance: 4: Min assist Dynamic Standing - Balance Activities: Lateral lean/weight shifting Dynamic Standing - Comments: Pt able to stand at min assist to min/guard for pulling up pajama bottoms after using 3in1.   End of Session PT - End  of Session Equipment Utilized During Treatment: Oxygen Activity Tolerance: Patient limited by fatigue;Other (comment) (anxiety) Patient left: in bed;with call bell/phone within reach Nurse Communication: Mobility status  GP     Page, Meribeth Mattes 10/31/2012, 9:21 AM

## 2012-10-31 NOTE — Progress Notes (Signed)
PHARMACIST - PHYSICIAN COMMUNICATION DR:   Blake Divine CONCERNING: Antibiotic IV to Oral Route Change Policy  RECOMMENDATION: This patient is receiving Levaquin by the intravenous route.  Based on criteria approved by the Pharmacy and Therapeutics Committee, the antibiotic(s) is/are being converted to the equivalent oral dose form(s).   DESCRIPTION: These criteria include:  Patient being treated for a respiratory tract infection, urinary tract infection, or cellulitis  The patient is not neutropenic and does not exhibit a GI malabsorption state  The patient is eating (either orally or via tube) and/or has been taking other orally administered medications for a least 24 hours  The patient is improving clinically and has a Tmax < 100.5  If you have questions about this conversion, please contact the Pharmacy Department  [x]   [phone] )  [facility] PharmD, [place] Pager [phone] 10/31/2012 2:26 PM

## 2012-10-31 NOTE — Telephone Encounter (Signed)
Spoke with Erskine Squibb, pt's POA and notified of recs per [facility] She verbalized understanding and states will inform her pt and her niece

## 2012-10-31 NOTE — Evaluation (Signed)
Occupational Therapy Evaluation Patient Details Name: [name] MRN: [id number] DOB: 17-Apr-1941 Today's Date: 10/31/2012 Time: 4098-1191 OT Time Calculation (min): 21 min  OT Assessment / Plan / Recommendation Clinical Impression  This 71 year old female was admitted for COPD exacerbation.  At baseline, she lives alone and functions at a modified to fully independent level.  She is appropriate for skilled OT to increase activity tolerance and independence with adls.      OT Assessment  Patient needs continued OT Services    Follow Up Recommendations  Home health OT (as long as pt continues to progress with balance for safety )    Barriers to Discharge      Equipment Recommendations  None recommended by OT (likely)    Recommendations for Other Services    Frequency  Min 2X/week    Precautions / Restrictions Precautions Precautions: Fall Precaution Comments: wears 2L O2 chronically, monitor O2 sats.  Restrictions Weight Bearing Restrictions: No   Pertinent Vitals/Pain No pain but had some nausea--will call for meds if it doesn't resolve with rest in a few minutes.  sats 92-97% on 2 liters.  HR 99- 109    ADL  Grooming: Performed;Wash/dry face;Set up Where Assessed - Grooming: Unsupported sitting Upper Body Bathing: Simulated;Set up Where Assessed - Upper Body Bathing: Unsupported sitting Lower Body Bathing: Simulated;Min guard Where Assessed - Lower Body Bathing: Supported sit to stand Upper Body Dressing: Performed;Minimal assistance (iv) Where Assessed - Upper Body Dressing: Unsupported sitting Lower Body Dressing: Performed;Min guard Where Assessed - Lower Body Dressing: Supported sit to stand Toilet Transfer: Performed;Min guard Statistician Method: Surveyor, minerals: Materials engineer and Hygiene: Performed;Min guard Where Assessed - Engineer, mining and Hygiene: Sit to stand from 3-in-1 or  toilet Equipment Used: Rolling Gorter Transfers/Ambulation Related to ADLs: ambulated in the hall with PT.  Pt is good about recognizing need to stop and  rest.  She has worn 02 for 4 years and carries tank with her in the community ADL Comments: pt can complete basic adls with set up/min guard for standing.  Has not slept well in 3 nights.    OT Diagnosis: Generalized weakness  OT Problem List: Decreased strength;Decreased activity tolerance;Impaired balance (sitting and/or standing);Decreased knowledge of use of DME or AE;Cardiopulmonary status limiting activity OT Treatment Interventions: Self-care/ADL training;Energy conservation;DME and/or AE instruction;Therapeutic activities;Patient/family education;Balance training   OT Goals Acute Rehab OT Goals OT Goal Formulation: With patient Time For Goal Achievement: 11/14/12 Potential to Achieve Goals: Good ADL Goals Pt Will Transfer to Toilet: with supervision;Ambulation;Regular height toilet ADL Goal: Toilet Transfer - Progress: Goal set today Pt Will Perform Toileting - Hygiene: with supervision;Sit to stand from 3-in-1/toilet (and clothes management) ADL Goal: Toileting - Hygiene - Progress: Goal set today Pt Will Perform Tub/Shower Transfer: Tub transfer;with supervision;Ambulation (with shower seat) ADL Goal: Tub/Shower Transfer - Progress: Goal set today Miscellaneous OT Goals Miscellaneous OT Goal #1: pt will gather clothes and complete adl at supervision level OT Goal: Miscellaneous Goal #1 - Progress: Goal set today Miscellaneous OT Goal #2: pt will verbalize 3 energy conservation techniques OT Goal: Miscellaneous Goal #2 - Progress: Goal set today  Visit Information  Last OT Received On: 10/31/12 Assistance Needed: +1    Subjective Data  Subjective: I'm weak right now Patient Stated Goal: home with home therapies   Prior Functioning     Home Living Lives With: Alone Available Help at Discharge: Friend(s) Type of  Home: House Home Access: Stairs to enter [employer] of Steps: 1 Home Layout: One level Bathroom Shower/Tub: Tub/shower unit (sits at bottom of tub) Bathroom Toilet: Standard Home Adaptive Equipment: None Prior Function Level of Independence: Independent Driving: Yes Comments: Can't do housework.  Does grocery shop and meal prep.  Communication Communication: No difficulties Dominant Hand: Right         Vision/Perception     Cognition  Overall Cognitive Status: Appears within functional limits for tasks assessed/performed Arousal/Alertness: Awake/alert Orientation Level: Appears intact for tasks assessed Behavior During Session: Anxious Cognition - Other Comments: Pt anxious about breathing and O2 sats during session.     Extremity/Trunk Assessment Right Upper Extremity Assessment RUE ROM/Strength/Tone: [facility] for tasks assessed Left Upper Extremity Assessment LUE ROM/Strength/Tone: WFL for tasks assessed Right Lower Extremity Assessment RLE ROM/Strength/Tone: [facility] for tasks assessed Left Lower Extremity Assessment LLE ROM/Strength/Tone: [facility] for tasks assessed Trunk Assessment Trunk Assessment: Kyphotic;Other exceptions Trunk Exceptions: Pt with elevated shoulders due to accessory muscle use when breathing.      Mobility Bed Mobility Bed Mobility: Supine to Sit;Sit to Supine Supine to Sit: 3: Mod assist Sit to Supine: 4: Min guard Details for Bed Mobility Assistance: Assist for trunk when getting to EOB with cues for hand placement and technique with self assist.  Transfers Sit to Stand: 4: Min guard;With upper extremity assist;From bed Stand to Sit: 4: Min guard;With upper extremity assist;To bed Details for Transfer Assistance: Min/guard for safety with cues for hand placement, safety and ensuring controlled descent when sitting.      Shoulder Instructions     Exercise     Balance Balance Balance Assessed: Yes Dynamic Standing Balance Dynamic  Standing - Balance Support: During functional activity Dynamic Standing - Level of Assistance: 4: Min assist Dynamic Standing - Balance Activities: Lateral lean/weight shifting Dynamic Standing - Comments: Pt able to stand at min assist to min/guard for pulling up pajama bottoms after using 3in1.    End of Session OT - End of Session Activity Tolerance: Patient tolerated treatment well Patient left: in bed;with call bell/phone within reach  GO     [facility] 10/31/2012, 9:26 AM Marica Otter, OTR/L [phone] 10/31/2012

## 2012-10-31 NOTE — Clinical Documentation Improvement (Signed)
RESPIRATORY FAILURE DOCUMENTATION CLARIFICATION QUERY   THIS DOCUMENT IS NOT A PERMANENT PART OF THE MEDICAL RECORD  TO RESPOND TO THE THIS QUERY, FOLLOW THE INSTRUCTIONS BELOW:  1. If needed, update documentation for the patient's encounter via the notes activity.  2. Access this query again and click edit on the In [employer].  3. After updating, or not, click F2 to complete all highlighted (required) fields concerning your review. Select "additional documentation in the medical record" OR "no additional documentation provided".  4. Click Sign note button.  5. The deficiency will fall out of your In Basket *Please let us know if you are not able to complete this workflow by phone or e-mail (listed below).  Please update your documentation within the medical record to reflect your response to this query.                                                                                    10/31/12  Dear Dr. Blake Divine, [facility],  In a better effort to capture your patient's severity of illness, reflect appropriate length of stay and utilization of resources, a review of the patient medical record has revealed the following indicators.    Based on your clinical judgment, please clarify and document in a progress note and/or discharge summary the clinical condition associated with the following supporting information:   In this patient admitted withIn this patient admitted with Copd exacerbation a review of the medical record reveals the following:   SOB  Pt on home O2 per ED notes  O2 @ 2L /[place] during admission  Treatments includes:  IV Steroids  Nebs, Levaquin,   Oxygen  SPIRIVA  SYMBICORT  PROVENTIL  albuterol (PROAIR HFA) 108    Clarification Needed   Please clarify if COPD exacerbation can be further specified as one of the diagnosis listed below and document in pn and d/c summary  Thank you for all that you do for our patients!    In responding to this  query please exercise your independent judgment.  The fact that a query is asked, does not imply that any particular answer is desired or expected.  Possible Clinical Conditions?  _______Acute Respiratory Failure _______Acute on Chronic Respiratory Failure _______Chronic Respiratory Failure _______Acute Pulmonary Insufficiency  _______Other Condition________________ _______Cannot Clinically Determine    Supporting Information:  Risk Factors: H/O emphysema, HTN,  Signs&Symptoms: SOB, Anciety  Diagnostics:  Lab:  Radiology:  Treatment: Listed above   You may use possible, probable, or suspect with inpatient documentation. possible, probable, suspected diagnoses MUST be documented at the time of discharge  Reviewed: additional documentation in the medical record ljh  Thank You,  Enis Slipper  RN, BSN, MSN/Inf, CCDS Clinical Documentation Specialist [facility] Dept Pager: [phone] / E-mail: [email or web address]  Health Information Management [place]

## 2012-10-31 NOTE — Progress Notes (Addendum)
[facility] PROGRESS NOTE  [name]:[id number] DOB: 10-28-1941 DOA: 10/29/2012 PCP: Mickie Hillier, MD  Assessment/Plan: 1. Acute on chronic respiratory failure: COPD EXACERBATION improving with IV steroids, will start tapering today.  Continue with Q4hr nebs, oxygen, and levaquin.  Continue with symbicort and spiriva.   2. Anxiety: continue with xanax tid prn.   3. 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 Stopped the fluids. TSH within normal limits.   4. Anemia: normocytic. Anemia panel shows ferritin of 115, low iron levels. She had a normal colonoscopy 8 years ago and plan for a colonoscopy in the near future. Will start her on iron supplements.   DVT prophylaxis.       Antibiotics:  levaquin  From 12 /24 >>>  HPI/Subjective: Anxiety improved with xanax. .   Objective: Filed Vitals:   10/30/12 2222 10/31/12 0208 10/31/12 0510 10/31/12 0853  BP: 134/70  137/79   Pulse: 84  78   Temp: 98 F (36.7 C)  97.4 F (36.3 C)   TempSrc: Oral  Oral   Resp: 20  16   Height:      Weight:      SpO2: 99% 96% 100% 98%    Intake/Output Summary (Last 24 hours) at 10/31/12 1010 Last data filed at 10/31/12 0300  Gross per 24 hour  Intake      0 ml  Output   1150 ml  Net  -1150 ml   Filed Weights   10/29/12 1410  Weight: 43 kg (94 lb 12.8 oz)    Exam:   General:  Alert afebrile very anxious  Cardiovascular: s1s2  Respiratory: scattered wheezing on the right side  Abdomen: soft NT ND BS+  Data Reviewed: Basic Metabolic Panel:  Lab 10/31/12 8119 10/30/12 0330 10/29/12 0935  NA 127* 125* 138  K 4.4 4.4 4.1  CL 93* 90* 98  CO2 25 28 29   GLUCOSE 102* 125* 188*  BUN 23 16 12   CREATININE 0.63 0.69 0.61  CALCIUM 8.6 9.4 9.7  MG -- -- --  PHOS -- -- --   Liver Function Tests: No results found for this basename: AST:5,ALT:5,ALKPHOS:5,BILITOT:5,PROT:5,ALBUMIN:5 in the last 168 hours No results found for this basename: LIPASE:5,AMYLASE:5 in the  last 168 hours No results found for this basename: AMMONIA:5 in the last 168 hours CBC:  Lab 10/30/12 0330 10/29/12 0935  WBC 8.0 6.5  NEUTROABS -- --  HGB 9.9* 12.2  HCT 29.8* 36.5  MCV 94.6 96.3  PLT 209 242   Cardiac Enzymes:  Lab 10/30/12 0330 10/29/12 2109 10/29/12 1500  CKTOTAL -- -- --  CKMB -- -- --  CKMBINDEX -- -- --  TROPONINI <0.30 <0.30 <0.30   BNP (last 3 results)  Basename 10/29/12 0935  PROBNP 352.9*   CBG: No results found for this basename: GLUCAP:5 in the last 168 hours  No results found for this or any previous visit (from the past 240 hour(s)).   Studies: No results found.  Scheduled Meds:    . aspirin EC  81 mg Oral Daily  . atorvastatin  80 mg Oral Daily  . budesonide-formoterol  2 puff Inhalation BID  . enoxaparin (LOVENOX) injection  30 mg Subcutaneous Q24H  . irbesartan  150 mg Oral Daily  . levalbuterol  0.63 mg Nebulization Q6H  . levofloxacin  750 mg Intravenous Q48H  . methylPREDNISolone (SOLU-MEDROL) injection  60 mg Intravenous Q12H  . tiotropium  18 mcg Inhalation Daily   Continuous Infusions:   Active Problems:  COPD  with acute exacerbation  Anxiety  SOB (shortness of breath) on exertion  Hyponatremia  Anemia        Deakon Frix  [facility] Pager [phone]. If 8PM-8AM, please contact night-coverage at [email or web address], password [facility] 10/31/2012, 10:10 AM  LOS: 2 days

## 2012-10-31 NOTE — Evaluation (Signed)
Clinical/Bedside Swallow Evaluation Patient Details  Name: [name] MRN: [id number] Date of Birth: 07/05/1941  Today's Date: 10/31/2012 Time: 4098-1191 SLP Time Calculation (min): 30 min  Past Medical History:  Past Medical History  Diagnosis Date  . Emphysema   . 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 Hyperlipidemia   . Hypertension    Past Surgical History:  Past Surgical History  Procedure Date  . Abdominal hysterectomy    HPI:  Patient reported 8 year h/o tx for diverticulum, as well as h/o hospital admission for suspected HA, which turned out to be GERD. She reported that "I havent had a good appetite for some time", and that she occasionally has pains in stomach/intestines fro 30-45 minutes after eating in AM.   Assessment / Plan / Recommendation Clinical Impression  Patient presents with a suspected primary esophageal dysphagia, which is based on patient's statements and responses to questions regarding PLOF. She stated that she had been treated for a diverticulum 8 years ago, and was found to have GERD symptoms during recent past hospitalization (initially she thought she was having a heart attack). Patient also described that she has a poor appetite, has infrequent nausea, occasional stomach and intestinal pain after eating in the mornings, which lasts for 30-45 minutes.She described "feels like its getting stopped up here [points to right side of throat]. She also complained of abdominal pain/bloating.     Aspiration Risk  Mild    Diet Recommendation Dysphagia 3 (Mechanical Soft);Thin liquid   Liquid Administration via: Cup Medication Administration: Whole meds with puree Supervision: Patient able to self feed;Intermittent supervision to cue for compensatory strategies Compensations: Slow rate;Small sips/bites;Follow solids with liquid Postural Changes and/or Swallow Maneuvers: Seated upright 90 degrees;Upright 30-60 min after meal    Other  Recommendations Recommended  Consults: Consider GI evaluation Oral Care Recommendations: Oral care BID   Follow Up Recommendations  Home health SLP;Other (comment) [facility] SLP pending patient's progress)    Frequency and Duration min 2x/week  2 weeks   Pertinent Vitals/Pain No observed changes in patient's vitals; no pt c/o pain.    SLP Swallow Goals Patient will consume recommended diet without observed clinical signs of aspiration with: Minimal assistance Patient will utilize recommended strategies during swallow to increase swallowing safety with: Minimal assistance   Swallow Study Prior Functional Status       General Date of Onset: 10/29/12 HPI: Patient reported 8 year h/o tx for diverticulum, as well as h/o hospital admission for suspected HA, which turned out to be GERD. She reported that "I havent had a good appetite for some time", and that she occasionally has pains in stomach/intestines fro 30-45 minutes after eating in AM. Type of Study: Bedside swallow evaluation Previous Swallow Assessment: N/A Diet Prior to this Study: Regular;Thin liquids;Other (Comment) (pt. stated she ate: small bfast, no lunch, regular dinner ) Temperature Spikes Noted: No Respiratory Status: Room air History of Recent Intubation: No Behavior/Cognition: Alert;Cooperative;Pleasant mood Oral Cavity - Dentition: Adequate natural dentition Self-Feeding Abilities: Able to feed self Patient Positioning: Upright in bed Baseline Vocal Quality: Clear Volitional Cough: Strong Volitional Swallow: Able to elicit    Oral/Motor/Sensory Function Overall Oral Motor/Sensory Function: Appears within functional limits for tasks assessed   Ice Chips Ice chips: Not tested   Thin Liquid Thin Liquid: Impaired Presentation: Cup;Self Fed Oral Phase Functional Implications: Other (comment) (patient c/o liquid "gets stuck on right side of throat")    Nectar Thick Nectar Thick Liquid: Not tested   Honey  Thick Honey Thick Liquid: Not tested   Puree  Puree: Within functional limits Presentation: Spoon   Solid   GO    Solid: Impaired Presentation: Self Fed Oral Phase Impairments: Impaired anterior to posterior transit Pharyngeal Phase Impairments: Suspected delayed Swallow;Other (comments) (pt. observed to struggle with swallowing solids)       Pablo Lawrence 10/31/2012,7:12 PM  Angela Nevin, MA, CCC-SLP Speech-Language Pathologist

## 2012-10-31 NOTE — Progress Notes (Signed)
INITIAL NUTRITION ASSESSMENT  DOCUMENTATION CODES Per approved criteria  -Not Applicable   INTERVENTION: 1.  Supplements; Ensure Complete daily  NUTRITION DIAGNOSIS: Suboptimal intake related to shortness of breath as evidenced by pt consumes 2 small meals/day.   Monitor:  1.  Food/Beverage; intake >50% of meals  Reason for Assessment: consult; assessment of needs/status  71 y.o. female  Admitting Dx: COPD exacerbation  ASSESSMENT: Pt admitted with shortness of breath.  RD consulted for assessment of nutrition status.  Pt reports she consumes 2 small meals daily.  Pt states that she has had a poor appetite for 'years.'  She makes herself eat regularly and ensures she gets at least 2 meals/day.  Pt typically consumes juice and muffins in the morning, snacks on triscuits during the day, and prepares a meals (meat and vegetables) in the evening.  Pt reports being dx with pre-diabetes last year.  She cut out pasta, potatoes, and rolls from her diet and lost about 3-5 lbs.  Her wt has since been stable.  Pt usual wt is ~100 lbs.  Pt without s/s of wasting and appears well nourished without recent wt change.   Pt asks about glucose control which is discussed.   RD encouraged intake.  Discussed that current intake at baseline is likely adequate to maintain wt, however she lacks reserves for illness.  Pt agrees endorsing poor intake since admission.  Pt requests a supplement.  RD to order.  Height: Ht Readings from Last 1 Encounters:  10/29/12 5\' 5"  (1.651 m)    Weight: Wt Readings from Last 1 Encounters:  10/29/12 94 lb 12.8 oz (43 kg)    Ideal Body Weight: 125 lbs  % Ideal Body Weight: 75%  Wt Readings from Last 10 Encounters:  10/29/12 94 lb 12.8 oz (43 kg)  06/19/12 96 lb 12.8 oz (43.908 kg)  12/21/11 98 lb 9.6 oz (44.725 kg)  08/15/11 99 lb 9.6 oz (45.178 kg)  04/27/11 99 lb (44.906 kg)  02/09/11 97 lb (43.999 kg)  08/10/10 94 lb 4 oz (42.752 kg)  06/16/10 94 lb (42.638  kg)  02/09/10 95 lb (43.092 kg)  12/06/09 96 lb 2.1 oz (43.605 kg)    Usual Body Weight: 98-100 lbs  % Usual Body Weight: 96%  BMI:  Body mass index is 15.78 kg/(m^2).  Estimated Nutritional Needs: Kcal: 1200-1380 Protein: 43-52g Fluid: >1.2 L/day  Skin: intact  Diet Order: General  EDUCATION NEEDS: -Education needs addressed   Intake/Output Summary (Last 24 hours) at 10/31/12 1325 Last data filed at 10/31/12 1205  Gross per 24 hour  Intake      0 ml  Output   1400 ml  Net  -1400 ml    Last BM: 12/25  Labs:   Lab 10/31/12 0500 10/30/12 0330 10/29/12 0935  NA 127* 125* 138  K 4.4 4.4 4.1  CL 93* 90* 98  CO2 25 28 29   BUN 23 16 12   CREATININE 0.63 0.69 0.61  CALCIUM 8.6 9.4 9.7  MG -- -- --  PHOS -- -- --  GLUCOSE 102* 125* 188*    CBG (last 3)  No results found for this basename: GLUCAP:3 in the last 72 hours  Scheduled Meds:   . aspirin EC  81 mg Oral Daily  . atorvastatin  80 mg Oral Daily  . budesonide-formoterol  2 puff Inhalation BID  . enoxaparin (LOVENOX) injection  30 mg Subcutaneous Q24H  . ferrous sulfate  325 mg Oral BID WC  . irbesartan  150 mg Oral Daily  . levalbuterol  0.63 mg Nebulization Q6H  . levofloxacin  750 mg Intravenous Q48H  . methylPREDNISolone (SOLU-MEDROL) injection  60 mg Intravenous Q8H  . senna-docusate  1 tablet Oral BID  . tiotropium  18 mcg Inhalation Daily    Continuous Infusions:   Past Medical History  Diagnosis Date  . Emphysema   . 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 Hyperlipidemia   . Hypertension     Past Surgical History  Procedure Date  . Abdominal hysterectomy     Loyce Dys, MS RD LDN Clinical Inpatient Dietitian Pager: [phone] Weekend/After hours pager: [phone]

## 2012-11-01 LAB — BASIC METABOLIC PANEL
BUN: 18 mg/dL (ref 6–23)
GFR calc Af Amer: >90 mL/min (ref >90)
GFR calc non Af Amer: 88 mL/min — ABNORMAL LOW (ref >90)
Potassium: 4.3 mEq/L (ref 3.5–5.1)

## 2012-11-01 MED ORDER — NYSTATIN 100000 UNIT/ML MT SUSP
5.0000 mL | Freq: Four times a day (QID) | OROMUCOSAL | Status: DC
  Administered 2012-11-01 – 2012-11-04 (×8): 500000 [IU] via ORAL
  Filled 2012-11-01 (×22): qty 5

## 2012-11-01 MED ORDER — METHYLPREDNISOLONE SODIUM SUCC 125 MG IJ SOLR
60.0000 mg | Freq: Two times a day (BID) | INTRAMUSCULAR | Status: DC
  Administered 2012-11-01 – 2012-11-02 (×2): 60 mg via INTRAVENOUS
  Filled 2012-11-01 (×4): qty 0.96

## 2012-11-01 MED ORDER — FLEET ENEMA 7-19 GM/118ML RE ENEM
1.0000 | ENEMA | Freq: Once | RECTAL | Status: AC
  Administered 2012-11-01: 1 via RECTAL
  Filled 2012-11-01: qty 1

## 2012-11-01 MED ORDER — POLYETHYLENE GLYCOL 3350 17 G PO PACK
17.0000 g | PACK | Freq: Every day | ORAL | Status: DC
  Administered 2012-11-01 – 2012-11-02 (×2): 17 g via ORAL
  Filled 2012-11-01 (×7): qty 1

## 2012-11-01 MED ORDER — FLUCONAZOLE 200 MG PO TABS
200.0000 mg | ORAL_TABLET | Freq: Once | ORAL | Status: AC
  Administered 2012-11-01: 200 mg via ORAL
  Filled 2012-11-01: qty 1

## 2012-11-01 NOTE — Progress Notes (Signed)
Occupational Therapy Treatment Patient Details Name: [name] MRN: [id number] DOB: 08/21/1941 Today's Date: 11/01/2012 Time: 1204-1219 OT Time Calculation (min): 15 min  OT Assessment / Plan / Recommendation Comments on Treatment Session Pt doing well but was preoccupied this session by her enema.    Follow Up Recommendations  Home health OT    Barriers to Discharge       Equipment Recommendations  None recommended by OT    Recommendations for Other Services    Frequency Min 2X/week   Plan Discharge plan remains appropriate    Precautions / Restrictions Precautions Precautions: Fall Precaution Comments: wears 2L O2 chronically, monitor O2 sats.   Pertinent Vitals/Pain Pt denied pain just stated she felt "bloated."    ADL  Grooming: Teeth care Where Assessed - Grooming: Supine, head of bed up Lower Body Dressing: Minimal assistance Where Assessed - Lower Body Dressing: Unsupported sitting Toilet Transfer Equipment: Bedside commode Toileting - Clothing Manipulation and Hygiene: Min guard Where Assessed - Toileting Clothing Manipulation and Hygiene: Sit to stand from 3-in-1 or toilet ADL Comments: Pt required physical A to don socks after propping foot up on the bed. Pt also refused to stand at the sink to brush teeth stating she wanted to conserve her energy for her enema.    OT Diagnosis:    OT Problem List:   OT Treatment Interventions:     OT Goals ADL Goals Pt Will Transfer to Toilet: with modified independence ADL Goal: Toilet Transfer - Progress: Updated due to goal met Pt Will Perform Toileting - Hygiene: with modified independence ADL Goal: Toileting - Hygiene - Progress: Updated due to goal met Miscellaneous OT Goals OT Goal: Miscellaneous Goal #1 - Progress: Progressing toward goals OT Goal: Miscellaneous Goal #2 - Progress: Progressing toward goals  Visit Information  Last OT Received On: 11/01/12 Assistance Needed: +1    Subjective Data  Subjective: I'm having an enema in a little while and I don't want to overexert myself.   Prior Functioning       Cognition  Overall Cognitive Status: Appears within functional limits for tasks assessed/performed Arousal/Alertness: Awake/alert Orientation Level: Appears intact for tasks assessed Behavior During Session: [facility] for tasks performed    Mobility  Shoulder Instructions Bed Mobility Sit to Supine: 5: Supervision;HOB flat Transfers Sit to Stand: 5: Supervision;With upper extremity assist;From chair/3-in-1;With armrests Stand to Sit: 5: Supervision;With upper extremity assist;To bed       Exercises      Balance     End of Session OT - End of Session Activity Tolerance: Patient limited by fatigue Patient left: in bed;with call bell/phone within reach  GO     Aadyn Buchheit A OTR/L [phone] 11/01/2012, 1:19 PM

## 2012-11-01 NOTE — Progress Notes (Signed)
Physical Therapy Treatment Patient Details Name: [name] MRN: [id number] DOB: 08/12/1941 Today's Date: 11/01/2012 Time: 2956-2130 PT Time Calculation (min): 25 min  PT Assessment / Plan / Recommendation Comments on Treatment Session  Assisted pt OOB to [facility] for a BM then off to amb in hallway.  "Wait a minute, I need to rest". Pt well educated on purse lip breathing and knowing when to stop activity to rest as she has been on oxygen for 4 yeras. Pt is hopful to D/C back home tomorrow.    Follow Up Recommendations  Home health PT     Does the patient have the potential to tolerate intense rehabilitation     Barriers to Discharge        Equipment Recommendations       Recommendations for Other Services    Frequency Min 3X/week   Plan Discharge plan remains appropriate    Precautions / Restrictions Precautions Precautions: Fall Precaution Comments: wears 2L O2 chronically, monitor O2 sats. Restrictions Weight Bearing Restrictions: No   Pertinent Vitals/Pain No c/o pain    Mobility  Bed Mobility Bed Mobility: Supine to Sit;Sit to Supine Supine to Sit: 5: Supervision Sit to Supine: 5: Supervision Details for Bed Mobility Assistance: Pt able to get her own legs on/off bed, just requires increased time Transfers Transfers: Sit to Stand;Stand to Sit Sit to Stand: 5: Supervision;From toilet;From bed Stand to Sit: To toilet;To bed Details for Transfer Assistance: Assisted pt on [facility] for a BM then off to amb. Good use of hands for safety. Ambulation/Gait Ambulation/Gait Assistance: 4: Min guard Ambulation Distance (Feet): 145 Feet Assistive device: Rolling Gonnella Ambulation/Gait Assistance Details: amb on 2 lts O2 sats range from 80 - 92% and x 4 standing rest breaks.  Pt states she has been on oxygen for 4 years and is well educated on purse lip breathing. Gait Pattern: Step-through pattern Gait velocity: Pt with somewhat increased gait speed. Cues for decreased speed  for increased energy conservation    PT Goals                                                      progressing    Visit Information  Last PT Received On: 11/01/12 Assistance Needed: +1    Subjective Data  Subjective: I can't go far   Cognition  Overall Cognitive Status: Appears within functional limits for tasks assessed/performed Arousal/Alertness: Awake/alert Orientation Level: Appears intact for tasks assessed Behavior During Session: [facility] for tasks performed    Balance   good with RW  End of Session PT - End of Session Equipment Utilized During Treatment: Gait belt Activity Tolerance: Patient limited by fatigue;Other (comment) (limited due to end stage COPD)   Felecia Shelling  PTA [facility]  Acute  Rehab Pager     [phone]

## 2012-11-01 NOTE — Progress Notes (Addendum)
[facility] PROGRESS NOTE  [name] HDQ:[id number] DOB: August 31, 1941 DOA: 10/29/2012 PCP: Mickie Hillier, MD  Assessment/Plan: 1. Acute on chronic respiratory failure: COPD EXACERBATION improving with IV steroids, will stop the IV Ssteroids tomorrow and start her on prednisone taper.  Continue with Q4hr nebs, oxygen, and levaquin.  Continue with symbicort and spiriva.   2. Anxiety: continue with xanax tid prn.   3. 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 Stopped the fluids. TSH within normal limits.   4. Anemia: normocytic. Anemia panel shows ferritin of 115, low iron levels. She had a normal colonoscopy 8 years ago and plan for a colonoscopy in the near future. Will start her on iron supplements.   DVT prophylaxis.   Constipation: stool softeners and fleet enema if needed.       Antibiotics:  levaquin  From 12 /24 >>>  HPI/Subjective: Anxiety improved with xanax. .  But still concerns with insurance. Called case manager to answer her questions.   Objective: Filed Vitals:   11/01/12 0526 11/01/12 0833 11/01/12 0844 11/01/12 0921  BP: 129/69   138/69  Pulse: 87   99  Temp: 97.7 F (36.5 C)     TempSrc: Oral     Resp: 20     Height:      Weight:      SpO2: 98% 93% 95% 98%    Intake/Output Summary (Last 24 hours) at 11/01/12 1226 Last data filed at 11/01/12 0842  Gross per 24 hour  Intake    240 ml  Output   2475 ml  Net  -2235 ml   Filed Weights   10/29/12 1410  Weight: 43 kg (94 lb 12.8 oz)    Exam:   General:  Alert afebrile very anxious  Cardiovascular: s1s2  Respiratory: scattered wheezing on the right side  Abdomen: soft NT ND BS+  Data Reviewed: Basic Metabolic Panel:  Lab 11/01/12 1194 10/31/12 0500 10/30/12 0330 10/29/12 0935  NA 137 127* 125* 138  K 4.3 4.4 4.4 4.1  CL 101 93* 90* 98  CO2 30 25 28 29   GLUCOSE 122* 102* 125* 188*  BUN 18 23 16 12   CREATININE 0.64 0.63 0.69 0.61  CALCIUM 8.9 8.6 9.4 9.7  MG -- -- -- --    PHOS -- -- -- --   Liver Function Tests: No results found for this basename: AST:5,ALT:5,ALKPHOS:5,BILITOT:5,PROT:5,ALBUMIN:5 in the last 168 hours No results found for this basename: LIPASE:5,AMYLASE:5 in the last 168 hours No results found for this basename: AMMONIA:5 in the last 168 hours CBC:  Lab 10/31/12 1300 10/30/12 0330 10/29/12 0935  WBC 12.5* 8.0 6.5  NEUTROABS -- -- --  HGB 11.1* 9.9* 12.2  HCT 32.1* 29.8* 36.5  MCV 92.0 94.6 96.3  PLT 226 209 242   Cardiac Enzymes:  Lab 10/30/12 0330 10/29/12 2109 10/29/12 1500  CKTOTAL -- -- --  CKMB -- -- --  CKMBINDEX -- -- --  TROPONINI <0.30 <0.30 <0.30   BNP (last 3 results)  Basename 10/29/12 0935  PROBNP 352.9*   CBG: No results found for this basename: GLUCAP:5 in the last 168 hours  No results found for this or any previous visit (from the past 240 hour(s)).   Studies: No results found.  Scheduled Meds:    . aspirin EC  81 mg Oral Daily  . atorvastatin  80 mg Oral Daily  . budesonide-formoterol  2 puff Inhalation BID  . enoxaparin (LOVENOX) injection  30 mg Subcutaneous Q24H  . ferrous sulfate  325 mg Oral BID WC  . irbesartan  150 mg Oral Daily  . levalbuterol  0.63 mg Nebulization Q6H  . levofloxacin  750 mg Oral Q48H  . methylPREDNISolone (SOLU-MEDROL) injection  60 mg Intravenous Q8H  . pantoprazole  40 mg Oral Q0600  . polyethylene glycol  17 g Oral Daily  . senna-docusate  1 tablet Oral BID  . tiotropium  18 mcg Inhalation Daily   Continuous Infusions:   Active Problems:  COPD with acute exacerbation  Anxiety  SOB (shortness of breath) on exertion  Hyponatremia  Anemia        Emileo Semel  [facility] Pager [phone]. If 8PM-8AM, please contact night-coverage at [email or web address], password [facility] 11/01/2012, 12:26 PM  LOS: 3 days

## 2012-11-01 NOTE — Progress Notes (Signed)
Speech Language Pathology Dysphagia Treatment Patient Details Name: [name] MRN: [id number] DOB: 18-Oct-1941 Today's Date: 11/01/2012 Time: 8657-8469 SLP Time Calculation (min): 25 min  Assessment / Plan / Recommendation Clinical Impression   Patient stated that she has been doing much better with her swallowing, and that her appetite is improving. She also stated that the MD has started treating her for oral thrush (this may be cause of patient's c/o feeling like food/liquid was getting "stuck" in right side of throat/larynx. No overt s/s aspiration noted with patient self-administering sips of thin liquid water. SLP provided patient with verbal and printed educational handout discussing mechanical soft solids and how to prepare at home.    Diet Recommendation  Continue with Current Diet: Dysphagia 3 (mechanical soft) Initiate / Change Diet: Thin liquid    SLP Plan Continue with current plan of care   Pertinent Vitals/Pain No changes in vitals or patient c/o pain  Swallowing Goals  SLP Swallowing Goals Patient will consume recommended diet without observed clinical signs of aspiration with: Minimal assistance Swallow Study Goal #1 - Progress: Progressing toward goal Swallow Study Goal #2 - Progress: Progressing toward goal  General Temperature Spikes Noted: No Respiratory Status: Room air Behavior/Cognition: Alert;Cooperative;Pleasant mood Oral Cavity - Dentition: Adequate natural dentition Patient Positioning: Upright in bed  Oral Cavity - Oral Hygiene     Dysphagia Treatment Treatment focused on: Skilled observation of diet tolerance Treatment Methods/Modalities: Skilled observation Patient observed directly with PO's: Yes Type of PO's observed: Thin liquids (patient declined solid textures at this time) Feeding: Able to feed self Liquids provided via: Cup Type of cueing: Verbal Amount of cueing: Minimal   GO     Pablo Lawrence 11/01/2012, 1:46  PM   Angela Nevin, MA, CCC-SLP  Speech-Language Pathologist

## 2012-11-02 ENCOUNTER — Inpatient Hospital Stay (HOSPITAL_COMMUNITY): Payer: Medicare Other

## 2012-11-02 LAB — BASIC METABOLIC PANEL
BUN: 18 mg/dL (ref 6–23)
Calcium: 8.7 mg/dL (ref 8.4–10.5)
Creatinine, Ser: 0.6 mg/dL (ref 0.50–1.10)
GFR calc Af Amer: >90 mL/min (ref >90)
GFR calc non Af Amer: 90 mL/min — ABNORMAL LOW (ref >90)
Potassium: 4.5 mEq/L (ref 3.5–5.1)

## 2012-11-02 MED ORDER — SODIUM CHLORIDE 0.9 % IV SOLN
INTRAVENOUS | Status: DC
  Administered 2012-11-02: 50 mL/h via INTRAVENOUS
  Administered 2012-11-03: 16:00:00 via INTRAVENOUS

## 2012-11-02 MED ORDER — FERROUS SULFATE 325 (65 FE) MG PO TABS: 325.0000 mg | ORAL_TABLET | Freq: Two times a day (BID) | ORAL | Status: DC

## 2012-11-02 MED ORDER — METHYLPREDNISOLONE SODIUM SUCC 125 MG IJ SOLR
60.0000 mg | INTRAMUSCULAR | Status: DC
  Administered 2012-11-03 – 2012-11-05 (×3): 60 mg via INTRAVENOUS
  Filled 2012-11-02 (×3): qty 0.96

## 2012-11-02 MED ORDER — ALPRAZOLAM 0.25 MG PO TABS: 0.2500 mg | ORAL_TABLET | Freq: Every evening | ORAL | Status: DC | PRN

## 2012-11-02 MED ORDER — POLYETHYLENE GLYCOL 3350 17 G PO PACK: 17.0000 g | PACK | Freq: Every day | ORAL | Status: DC | PRN

## 2012-11-02 MED ORDER — SENNOSIDES-DOCUSATE SODIUM 8.6-50 MG PO TABS: 1.0000 | ORAL_TABLET | Freq: Two times a day (BID) | ORAL | Status: DC

## 2012-11-02 MED ORDER — PREDNISONE 20 MG PO TABS: ORAL_TABLET | ORAL | Status: DC

## 2012-11-02 MED ORDER — MORPHINE SULFATE 2 MG/ML IJ SOLN
0.5000 mg | Freq: Three times a day (TID) | INTRAMUSCULAR | Status: DC | PRN
  Administered 2012-11-02 – 2012-11-03 (×2): 0.5 mg via INTRAVENOUS
  Filled 2012-11-02 (×2): qty 1

## 2012-11-02 MED ORDER — LORAZEPAM 2 MG/ML IJ SOLN
1.0000 mg | Freq: Three times a day (TID) | INTRAMUSCULAR | Status: DC | PRN
  Administered 2012-11-02 – 2012-11-07 (×12): 1 mg via INTRAVENOUS
  Filled 2012-11-02 (×12): qty 1

## 2012-11-02 MED ORDER — PREDNISONE 50 MG PO TABS
60.0000 mg | ORAL_TABLET | Freq: Every day | ORAL | Status: DC
  Filled 2012-11-02 (×2): qty 1

## 2012-11-02 NOTE — Progress Notes (Signed)
[facility] PROGRESS NOTE  [name] WUJ:[id number] DOB: [birth year] DOA: 10/29/2012 PCP: Jessica Hillier, MD  Assessment/Plan: 1. Acute on chronic respiratory failure: COPD EXACERBATION improving with IV steroids, on tapering dose Continue with Q4hr nebs, oxygen, and levaquin. Continue with symbicort and spiriva.   2. Anxiety: continue with xanax tid prn. And ativan for IV.   3. 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 TSH within normal limits.   4. Anemia: normocytic. Anemia panel shows ferritin of 115, low iron levels. She had a normal colonoscopy 8 years ago and plan for a colonoscopy in the near future.  5. Abdominal distention abd films show dilated small bowel loops possible partial sbo. Kept her npo, will obtain repeat abd films in am. Changed medications to IV route. NG tube placement on hold as is not nauseated or vomiting. Will continue to monitor.   DVT prophylaxis.   Constipation: stool softeners and fleet enema if needed.       Antibiotics:  levaquin  From 12 /24 >>>   HPI/Subjective: abd pain and bloating.   Objective: Filed Vitals:   11/02/12 0821 11/02/12 1349 11/02/12 1355 11/02/12 1503  BP:  145/66 106/69   Pulse:  87 135   Temp:  98.5 F (36.9 C) 98.7 F (37.1 C)   TempSrc:  Oral Oral   Resp:  20 20   Height:      Weight:      SpO2: 98% 100% 97% 98%    Intake/Output Summary (Last 24 hours) at 11/02/12 1742 Last data filed at 11/02/12 0815  Gross per 24 hour  Intake    240 ml  Output    775 ml  Net   -535 ml   Filed Weights   10/29/12 1410  Weight: 43 kg (94 lb 12.8 oz)    Exam:   General:  Alert afebrile very anxious  Cardiovascular: s1s2  Respiratory: scattered wheezing on the right side  Abdomen: soft NT ND BS+  Data Reviewed: Basic Metabolic Panel:  Lab 11/02/12 9562 11/01/12 0512 10/31/12 0500 10/30/12 0330 10/29/12 0935  NA 133* 137 127* 125* 138  K 4.5 4.3 4.4 4.4 4.1  CL 97 101 93* 90* 98  CO2 29 30 25  28 29   GLUCOSE 116* 122* 102* 125* 188*  BUN 18 18 23 16 12   CREATININE 0.60 0.64 0.63 0.69 0.61  CALCIUM 8.7 8.9 8.6 9.4 9.7  MG -- -- -- -- --  PHOS -- -- -- -- --   Liver Function Tests: No results found for this basename: AST:5,ALT:5,ALKPHOS:5,BILITOT:5,PROT:5,ALBUMIN:5 in the last 168 hours No results found for this basename: LIPASE:5,AMYLASE:5 in the last 168 hours No results found for this basename: AMMONIA:5 in the last 168 hours CBC:  Lab 10/31/12 1300 10/30/12 0330 10/29/12 0935  WBC 12.5* 8.0 6.5  NEUTROABS -- -- --  HGB 11.1* 9.9* 12.2  HCT 32.1* 29.8* 36.5  MCV 92.0 94.6 96.3  PLT 226 209 242   Cardiac Enzymes:  Lab 10/30/12 0330 10/29/12 2109 10/29/12 1500  CKTOTAL -- -- --  CKMB -- -- --  CKMBINDEX -- -- --  TROPONINI <0.30 <0.30 <0.30   BNP (last 3 results)  Basename 10/29/12 0935  PROBNP 352.9*   CBG: No results found for this basename: GLUCAP:5 in the last 168 hours  No results found for this or any previous visit (from the past 240 hour(s)).   Studies: Dg Abd 2 Views  11/02/2012  *RADIOLOGY REPORT*  Clinical Data: Constipation, abdominal pain, bloating.  ABDOMEN - 2 VIEW  Comparison: 07/22/2012  Findings:  Dilated small bowel loops throughout the abdomen and pelvis concerning for small bowel obstruction.  Gas within nondistended colon.  No organomegaly or free air.  No acute bony abnormality.  IMPRESSION: Dilated small bowel loops concerning for small bowel obstruction.   Original Report Authenticated By: Charlett Nose, M.D.     Scheduled Meds:    . aspirin EC  81 mg Oral Daily  . atorvastatin  80 mg Oral Daily  . budesonide-formoterol  2 puff Inhalation BID  . enoxaparin (LOVENOX) injection  30 mg Subcutaneous Q24H  . irbesartan  150 mg Oral Daily  . levalbuterol  0.63 mg Nebulization Q6H  . levofloxacin  750 mg Oral Q48H  . methylPREDNISolone (SOLU-MEDROL) injection  60 mg Intravenous Q24H  . nystatin  5 mL Oral QID  . pantoprazole  40 mg  Oral Q0600  . polyethylene glycol  17 g Oral Daily  . senna-docusate  1 tablet Oral BID  . tiotropium  18 mcg Inhalation Daily   Continuous Infusions:    . sodium chloride 50 mL/hr (11/02/12 1733)    Active Problems:  COPD with acute exacerbation  Anxiety  SOB (shortness of breath) on exertion  Hyponatremia  Anemia        [name]  [facility] Pager [phone]. If 8PM-8AM, please contact night-coverage at [email or web address], password [facility] 11/02/2012, 5:42 PM  LOS: 4 days

## 2012-11-03 ENCOUNTER — Inpatient Hospital Stay (HOSPITAL_COMMUNITY): Payer: Medicare Other

## 2012-11-03 LAB — BASIC METABOLIC PANEL
Calcium: 8.7 mg/dL (ref 8.4–10.5)
Chloride: 96 mEq/L (ref 96–112)
Creatinine, Ser: 0.69 mg/dL (ref 0.50–1.10)
GFR calc Af Amer: >90 mL/min (ref >90)
GFR calc non Af Amer: 86 mL/min — ABNORMAL LOW (ref >90)

## 2012-11-03 LAB — CBC
MCHC: 33.3 g/dL (ref 30.0–36.0)
Platelets: 209 10*3/uL (ref 150–400)
RDW: 13.1 % (ref 11.5–15.5)
WBC: 13.9 10*3/uL — ABNORMAL HIGH (ref 4.0–10.5)

## 2012-11-03 MED ORDER — ALBUTEROL SULFATE HFA 108 (90 BASE) MCG/ACT IN AERS
2.0000 | INHALATION_SPRAY | RESPIRATORY_TRACT | Status: DC | PRN
  Filled 2012-11-03: qty 6.7

## 2012-11-03 MED ORDER — ALBUTEROL SULFATE (5 MG/ML) 0.5% IN NEBU
2.5000 mg | INHALATION_SOLUTION | Freq: Two times a day (BID) | RESPIRATORY_TRACT | Status: DC
  Administered 2012-11-04: 2.5 mg via RESPIRATORY_TRACT
  Filled 2012-11-03 (×2): qty 0.5

## 2012-11-03 MED ORDER — ALBUTEROL SULFATE (5 MG/ML) 0.5% IN NEBU: 2.5000 mg | INHALATION_SOLUTION | Freq: Four times a day (QID) | RESPIRATORY_TRACT | Status: DC | PRN

## 2012-11-03 NOTE — Progress Notes (Signed)
[facility] PROGRESS NOTE  [name] HYQ:[id number] DOB: 02-Jun-1941 DOA: 10/29/2012 PCP: Mickie Hillier, MD  Assessment/Plan: 1. Acute on chronic respiratory failure: COPD EXACERBATION improving with IV steroids, on tapering dose Continue with Q4hr nebs, oxygen, and levaquin. Continue with symbicort and spiriva.   2. Anxiety: continue with xanax tid prn. And ativan for IV.   3. 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 TSH within normal limits.   4. Anemia: normocytic. Anemia panel shows ferritin of 115, low iron levels. She had a normal colonoscopy 8 years ago and plan for a colonoscopy in the near future.  5. Abdominal distention abd films show dilated small bowel loops possible partial sbo. Kept her npo, will obtain repeat abd films in am. Changed medications to IV route. NG tube to be placed. Will repeat abd films in am. Will continue to monitor.   DVT prophylaxis.         Antibiotics:  levaquin  From 12 /24 >>>   HPI/Subjective: abd pain and bloating.   Objective: Filed Vitals:   11/02/12 2222 11/03/12 0607 11/03/12 0830 11/03/12 0900  BP: 132/71 120/64  124/68  Pulse: 78 78    Temp: 98.6 F (37 C) 98.2 F (36.8 C)    TempSrc: Oral Oral    Resp: 18 20    Height:      Weight:      SpO2: 100% 99% 99%     Intake/Output Summary (Last 24 hours) at 11/03/12 1333 Last data filed at 11/03/12 9528  Gross per 24 hour  Intake 646.67 ml  Output    100 ml  Net 546.67 ml   Filed Weights   10/29/12 1410  Weight: 43 kg (94 lb 12.8 oz)    Exam:   General:  Alert afebrile very anxious  Cardiovascular: s1s2  Respiratory: scattered wheezing on the right side  Abdomen: soft NT ND BS+  Data Reviewed: Basic Metabolic Panel:  Lab 11/03/12 4132 11/02/12 0516 11/01/12 0512 10/31/12 0500 10/30/12 0330  NA 132* 133* 137 127* 125*  K 4.9 4.5 4.3 4.4 4.4  CL 96 97 101 93* 90*  CO2 30 29 30 25 28   GLUCOSE 93 116* 122* 102* 125*  BUN 20 18 18 23 16     CREATININE 0.69 0.60 0.64 0.63 0.69  CALCIUM 8.7 8.7 8.9 8.6 9.4  MG -- -- -- -- --  PHOS -- -- -- -- --   Liver Function Tests: No results found for this basename: AST:5,ALT:5,ALKPHOS:5,BILITOT:5,PROT:5,ALBUMIN:5 in the last 168 hours No results found for this basename: LIPASE:5,AMYLASE:5 in the last 168 hours No results found for this basename: AMMONIA:5 in the last 168 hours CBC:  Lab 11/03/12 0521 10/31/12 1300 10/30/12 0330 10/29/12 0935  WBC 13.9* 12.5* 8.0 6.5  NEUTROABS -- -- -- --  HGB 11.8* 11.1* 9.9* 12.2  HCT 35.4* 32.1* 29.8* 36.5  MCV 94.7 92.0 94.6 96.3  PLT 209 226 209 242   Cardiac Enzymes:  Lab 10/30/12 0330 10/29/12 2109 10/29/12 1500  CKTOTAL -- -- --  CKMB -- -- --  CKMBINDEX -- -- --  TROPONINI <0.30 <0.30 <0.30   BNP (last 3 results)  Basename 10/29/12 0935  PROBNP 352.9*   CBG: No results found for this basename: GLUCAP:5 in the last 168 hours  No results found for this or any previous visit (from the past 240 hour(s)).   Studies: Dg Abd 2 Views  11/03/2012  *RADIOLOGY REPORT*  Clinical Data: Follow up small bowel obstruction  ABDOMEN - 2  VIEW  Comparison: Prior KUB 11/02/2012  Findings: Similar appearance of multiple loops of air filled and dilated small bowel throughout the abdomen with multiple small air fluid levels on the upright view.  Overall, findings are similar to slightly progressed.  No evidence of free air, or pneumatosis.  No acute osseous abnormality.  Visualized lung bases are clear.  IMPRESSION:  Similar to slightly progressed small bowel obstruction.   Original Report Authenticated By: Malachy Moan, M.D.    Dg Abd 2 Views  11/02/2012  *RADIOLOGY REPORT*  Clinical Data: Constipation, abdominal pain, bloating.  ABDOMEN - 2 VIEW  Comparison: 07/22/2012  Findings:  Dilated small bowel loops throughout the abdomen and pelvis concerning for small bowel obstruction.  Gas within nondistended colon.  No organomegaly or free air.  No  acute bony abnormality.  IMPRESSION: Dilated small bowel loops concerning for small bowel obstruction.   Original Report Authenticated By: Charlett Nose, M.D.     Scheduled Meds:    . aspirin EC  81 mg Oral Daily  . atorvastatin  80 mg Oral Daily  . budesonide-formoterol  2 puff Inhalation BID  . enoxaparin (LOVENOX) injection  30 mg Subcutaneous Q24H  . irbesartan  150 mg Oral Daily  . levalbuterol  0.63 mg Nebulization Q6H  . levofloxacin  750 mg Oral Q48H  . methylPREDNISolone (SOLU-MEDROL) injection  60 mg Intravenous Q24H  . nystatin  5 mL Oral QID  . pantoprazole  40 mg Oral Q0600  . polyethylene glycol  17 g Oral Daily  . senna-docusate  1 tablet Oral BID  . tiotropium  18 mcg Inhalation Daily   Continuous Infusions:    . sodium chloride 50 mL/hr at 11/03/12 1610    Active Problems:  COPD with acute exacerbation  Anxiety  SOB (shortness of breath) on exertion  Hyponatremia  Anemia        [name]  [facility] Pager [phone]. If 8PM-8AM, please contact night-coverage at [email or web address], password [facility] 11/03/2012, 1:33 PM  LOS: 5 days

## 2012-11-04 ENCOUNTER — Inpatient Hospital Stay (HOSPITAL_COMMUNITY): Payer: Medicare Other

## 2012-11-04 ENCOUNTER — Encounter (HOSPITAL_COMMUNITY): Payer: Self-pay | Admitting: General Surgery

## 2012-11-04 DIAGNOSIS — K56609 Unspecified intestinal obstruction, unspecified as to partial versus complete obstruction: Secondary | ICD-10-CM

## 2012-11-04 HISTORY — DX: Unspecified intestinal obstruction, unspecified as to partial versus complete obstruction: K56.609

## 2012-11-04 LAB — BASIC METABOLIC PANEL
Chloride: 97 mEq/L (ref 96–112)
GFR calc Af Amer: >90 mL/min (ref >90)
GFR calc non Af Amer: 86 mL/min — ABNORMAL LOW (ref >90)
Potassium: 4.7 mEq/L (ref 3.5–5.1)
Sodium: 135 mEq/L (ref 135–145)

## 2012-11-04 MED ORDER — HYDRALAZINE HCL 20 MG/ML IJ SOLN: 5.0000 mg | Freq: Three times a day (TID) | INTRAMUSCULAR | Status: DC | PRN

## 2012-11-04 MED ORDER — LEVALBUTEROL HCL 0.63 MG/3ML IN NEBU
0.6300 mg | INHALATION_SOLUTION | Freq: Four times a day (QID) | RESPIRATORY_TRACT | Status: DC
  Administered 2012-11-04 – 2012-11-05 (×2): 0.63 mg via RESPIRATORY_TRACT
  Filled 2012-11-04 (×9): qty 3

## 2012-11-04 MED ORDER — DEXTROSE-NACL 5-0.9 % IV SOLN
INTRAVENOUS | Status: DC
  Administered 2012-11-04: 50 mL/h via INTRAVENOUS
  Administered 2012-11-05 – 2012-11-06 (×2): via INTRAVENOUS

## 2012-11-04 MED ORDER — LEVOFLOXACIN IN D5W 500 MG/100ML IV SOLN
500.0000 mg | Freq: Every day | INTRAVENOUS | Status: DC
  Administered 2012-11-04 – 2012-11-05 (×2): 500 mg via INTRAVENOUS
  Filled 2012-11-04 (×2): qty 100

## 2012-11-04 NOTE — Consult Note (Signed)
Agree with above  Con't NGT Repeat films in AM Non-operative treatment at this time.

## 2012-11-04 NOTE — Consult Note (Signed)
[name] [birth year]  [id number].   Requesting MD: Dr. Kathlen Mody Chief Complaint/Reason for Consult: SBO HPI: This is a 71 yo female with end stage COPD who was admitted with a COPD exacerbation. Several days after admission she developed abdominal distention.  She then developed some pain, but this fairly quickly resolved.  She had some abdominal films obtained which revealed a SBO.  An NGT was placed the following day.  Her x-rays have no shown much improvement over the last 3 days.  She has not had a CT scan.  She is passing minimal flatus, and her last BM was 3 days ago after an enema.  We have been asked to see for further recommendations.  Review of Systems: Please see HPI, otherwise all other systems are negative.  She does wear 2 L of O2 at home.  Family History  Problem Relation Age of Onset  . Heart failure Mother   . Heart attack Father   . Heart attack Paternal Grandmother     Past Medical History  Diagnosis Date  . Emphysema   . 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 Hyperlipidemia   . Hypertension     Past Surgical History  Procedure Date  . Abdominal hysterectomy     Social History:  reports that she quit smoking about 20 years ago. Her smoking use included Cigarettes. She has a 30 pack-year smoking history. She has never used smokeless tobacco. She reports that she does not drink alcohol or use illicit drugs.  Allergies:  Allergies  Allergen Reactions  . Oxycodone-Acetaminophen     REACTION: headaches,nausea    Medications Prior to Admission  Medication Sig Dispense Refill  . albuterol (PROAIR HFA) 108 (90 BASE) MCG/ACT inhaler Inhale 2 puffs into the lungs every 6 (six) hours as needed.  1 Inhaler  11  . albuterol (PROVENTIL) (2.5 MG/3ML) 0.083% nebulizer solution Take 3 mLs (2.5 mg total) by nebulization every 6 (six) hours as needed. Dx: 492.8  360 mL  3  . aspirin 81 MG tablet Take 81 mg by mouth daily.        Marland Kitchen atorvastatin (LIPITOR) 80 MG tablet Take 80  mg by mouth daily.        Marland Kitchen BENICAR 40 MG tablet Take 1 tablet by mouth daily.      . budesonide-formoterol (SYMBICORT) 160-4.5 MCG/ACT inhaler Inhale 2 puffs into the lungs 2 (two) times daily.  1 Inhaler  11  . Calcium Carbonate (CALTRATE 600) 1500 MG TABS Take 1 tablet by mouth 2 (two) times daily.        . Lutein 20 MG CAPS Take 1 capsule by mouth daily.        . Multiple Vitamin (ANTIOXIDANT PO) Take 1 tablet by mouth daily.        . Omega-3 Fatty Acids (FISH OIL) 1000 MG CAPS Take 1 capsule by mouth 2 (two) times daily.        Bertram Gala Glycol-Propyl Glycol (SYSTANE) 0.4-0.3 % SOLN Apply 1 drop to eye daily as needed. dryness      . risedronate (ACTONEL) 150 MG tablet Take 150 mg by mouth every 30 (thirty) days. with water on empty stomach, nothing by mouth or lie down for next 30 minutes.       . sodium chloride (MURO 128) 5 % ophthalmic solution Place 1 drop into the left eye as needed. Dry eyes      . tiotropium (SPIRIVA) 18 MCG inhalation capsule Place 1 capsule (  18 mcg total) into inhaler and inhale daily.  30 capsule  11  . [DISCONTINUED] ALPRAZolam (XANAX) 0.25 MG tablet Take 0.25 mg by mouth at bedtime as needed.       . [DISCONTINUED] predniSONE (DELTASONE) 10 MG tablet Take 40 mg by mouth daily. Take 4 for two days three for two days two for two days one for two days. Pt is on day 2 of therapy        Blood pressure 137/63, pulse 86, temperature 98.2 F (36.8 C), temperature source Oral, resp. rate 18, height 5\' 5"  (1.651 m), weight 94 lb 12.8 oz (43 kg), SpO2 98.00%. Physical Exam: General: pleasant, frail appearing white female who is laying in bed in NAD HEENT: head is normocephalic, atraumatic.  Sclera are noninjected.  PERRL.  Ears and nose without any masses or lesions. Bouton in place  Mouth is pink, NGT in place with bilious output Heart: regular, rate, and rhythm.  Normal s1,s2. No obvious murmurs, gallops, or rubs noted.  Palpable radial and pedal pulses bilaterally Lungs:  CTAB, no wheezes, rhonchi, or rales noted.  Respiratory effort nonlabored Abd: soft, NT, moderate distention, hypoactive BS, no masses, hernias, or organomegaly, midline scar noted from prior hysterectomy MS: all 4 extremities are symmetrical with no cyanosis, clubbing, or edema. Skin: warm and dry with no masses, lesions, or rashes Psych: A&Ox3 with an appropriate affect.    Results for orders placed during the hospital encounter of 10/29/12 (from the past 48 hour(s))  BASIC METABOLIC PANEL     Status: Abnormal   Collection Time   11/03/12  5:21 AM      Component Value Range Comment   Sodium 132 (*) 135 - 145 mEq/L    Potassium 4.9  3.5 - 5.1 mEq/L    Chloride 96  96 - 112 mEq/L    CO2 30  19 - 32 mEq/L    Glucose, Bld 93  70 - 99 mg/dL    BUN 20  6 - 23 mg/dL    Creatinine, Ser 0.86  0.50 - 1.10 mg/dL    Calcium 8.7  8.4 - 57.8 mg/dL    GFR calc non Af Amer 86 (*) >90 mL/min    GFR calc Af Amer >90  >90 mL/min   CBC     Status: Abnormal   Collection Time   11/03/12  5:21 AM      Component Value Range Comment   WBC 13.9 (*) 4.0 - 10.5 K/uL    RBC 3.74 (*) 3.87 - 5.11 MIL/uL    Hemoglobin 11.8 (*) 12.0 - 15.0 g/dL    HCT 46.9 (*) 62.9 - 46.0 %    MCV 94.7  78.0 - 100.0 fL    MCH 31.6  26.0 - 34.0 pg    MCHC 33.3  30.0 - 36.0 g/dL    RDW 52.8  41.3 - 24.4 %    Platelets 209  150 - 400 K/uL   BASIC METABOLIC PANEL     Status: Abnormal   Collection Time   11/04/12  4:45 AM      Component Value Range Comment   Sodium 135  135 - 145 mEq/L    Potassium 4.7  3.5 - 5.1 mEq/L    Chloride 97  96 - 112 mEq/L    CO2 31  19 - 32 mEq/L    Glucose, Bld 57 (*) 70 - 99 mg/dL    BUN 19  6 - 23 mg/dL  Creatinine, Ser 0.68  0.50 - 1.10 mg/dL    Calcium 9.4  8.4 - 40.9 mg/dL    GFR calc non Af Amer 86 (*) >90 mL/min    GFR calc Af Amer >90  >90 mL/min    Dg Abd 2 Views  11/04/2012  *RADIOLOGY REPORT*  Clinical Data: Small bowel obstruction  ABDOMEN - 2 VIEW  Comparison: 11/03/2012   Findings: Upright film shows no evidence for intraperitoneal free air.  To NG tube tip is just distal to the esophagogastric junction.  Proximal port the NG tube is in the distal esophagus. Diffuse gaseous small bowel distention persists without substantial interval change.  Small bowel loops measure up to 4.1 cm in diameter.  IMPRESSION: No substantial interval change and diffuse gaseous small bowel dilatation consistent with obstruction.   Original Report Authenticated By: Kennith Center, M.D.    Dg Abd 2 Views  11/03/2012  *RADIOLOGY REPORT*  Clinical Data: Follow up small bowel obstruction  ABDOMEN - 2 VIEW  Comparison: Prior KUB 11/02/2012  Findings: Similar appearance of multiple loops of air filled and dilated small bowel throughout the abdomen with multiple small air fluid levels on the upright view.  Overall, findings are similar to slightly progressed.  No evidence of free air, or pneumatosis.  No acute osseous abnormality.  Visualized lung bases are clear.  IMPRESSION:  Similar to slightly progressed small bowel obstruction.   Original Report Authenticated By: Malachy Moan, M.D.        Assessment/Plan 1. Partial small bowel obstruction 2. End stage COPD 3. History of ovarian cancer  Plan: 1. Agree with conservative management with NGT and bowel rest.  Hopefully the patient will get better without needing an operation.  If she does not improve, she would require an operation; however, due to her end stage COPD she is very high surgical risk.  She would have a lot of difficulties coming off of a ventilator; however, if she choose not to proceed with surgery then she would not be able to eat or drink, which would eventually result in demise.  Will order repeat abd films for the morning and following along with you.  Thank you for this consultation.  All of the above was d/w the patient.  Hernando Reali E 11/04/2012, 3:01 PM Pager: [phone]

## 2012-11-04 NOTE — Progress Notes (Signed)
SLP Cancellation Note  Patient Details Name: RIOT BARRICK MRN: [id number] DOB: 14-Sep-1941   Cancelled treatment:        Pt now NPO and for NG placement.  SLP to follow for dysphagia management.    Donavan Burnet, MS [facility] SLP [phone]

## 2012-11-04 NOTE — Progress Notes (Signed)
[facility] PROGRESS NOTE  [name] VHQ:[id number] DOB: 05-Apr-1941 DOA: 10/29/2012 PCP: Mickie Hillier, MD  Assessment/Plan: 1. Acute on chronic respiratory failure: COPD EXACERBATION improving with IV steroids, on tapering dose Continue with Q4hr nebs, oxygen, and levaquin. Continue with symbicort and spiriva.   2. Anxiety: continue with xanax tid prn. And ativan for IV.   3. 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 TSH within normal limits.   4. Anemia: normocytic. Anemia panel shows ferritin of 115, low iron levels. She had a normal colonoscopy 8 years ago and plan for a colonoscopy in the near future.  5. Abdominal distention abd films show dilated small bowel loops possible partial sbo. Kept her npo, will obtain repeat abd films in am. Changed medications to IV route. NG tube to be placed. Will repeat abd films in am. Will continue to monitor.  Surgical consult called for further recommendations.  DVT prophylaxis.         Antibiotics:  levaquin  From 12 /24 >>>   HPI/Subjective: abd pain and bloating.   Objective: Filed Vitals:   11/04/12 1347 11/04/12 1603 11/04/12 1605 11/04/12 1945  BP: 137/63   148/67  Pulse: 86   96  Temp: 98.2 F (36.8 C)     TempSrc: Oral     Resp: 18   19  Height:      Weight:      SpO2: 98% 82% 94% 100%    Intake/Output Summary (Last 24 hours) at 11/04/12 2011 Last data filed at 11/04/12 1900  Gross per 24 hour  Intake   1125 ml  Output   1550 ml  Net   -425 ml   Filed Weights   10/29/12 1410  Weight: 43 kg (94 lb 12.8 oz)    Exam:   General:  Alert afebrile very anxious  Cardiovascular: s1s2  Respiratory: scattered wheezing on the right side  Abdomen: soft NT ND BS+  Data Reviewed: Basic Metabolic Panel:  Lab 11/04/12 4132 11/03/12 0521 11/02/12 0516 11/01/12 0512 10/31/12 0500  NA 135 132* 133* 137 127*  K 4.7 4.9 4.5 4.3 4.4  CL 97 96 97 101 93*  CO2 31 30 29 30 25   GLUCOSE 57* 93 116* 122*  102*  BUN 19 20 18 18 23   CREATININE 0.68 0.69 0.60 0.64 0.63  CALCIUM 9.4 8.7 8.7 8.9 8.6  MG -- -- -- -- --  PHOS -- -- -- -- --   Liver Function Tests: No results found for this basename: AST:5,ALT:5,ALKPHOS:5,BILITOT:5,PROT:5,ALBUMIN:5 in the last 168 hours No results found for this basename: LIPASE:5,AMYLASE:5 in the last 168 hours No results found for this basename: AMMONIA:5 in the last 168 hours CBC:  Lab 11/03/12 0521 10/31/12 1300 10/30/12 0330 10/29/12 0935  WBC 13.9* 12.5* 8.0 6.5  NEUTROABS -- -- -- --  HGB 11.8* 11.1* 9.9* 12.2  HCT 35.4* 32.1* 29.8* 36.5  MCV 94.7 92.0 94.6 96.3  PLT 209 226 209 242   Cardiac Enzymes:  Lab 10/30/12 0330 10/29/12 2109 10/29/12 1500  CKTOTAL -- -- --  CKMB -- -- --  CKMBINDEX -- -- --  TROPONINI <0.30 <0.30 <0.30   BNP (last 3 results)  Basename 10/29/12 0935  PROBNP 352.9*   CBG: No results found for this basename: GLUCAP:5 in the last 168 hours  No results found for this or any previous visit (from the past 240 hour(s)).   Studies: Dg Abd 2 Views  11/04/2012  *RADIOLOGY REPORT*  Clinical Data: Small bowel obstruction  ABDOMEN - 2 VIEW  Comparison: 11/03/2012  Findings: Upright film shows no evidence for intraperitoneal free air.  To NG tube tip is just distal to the esophagogastric junction.  Proximal port the NG tube is in the distal esophagus. Diffuse gaseous small bowel distention persists without substantial interval change.  Small bowel loops measure up to 4.1 cm in diameter.  IMPRESSION: No substantial interval change and diffuse gaseous small bowel dilatation consistent with obstruction.   Original Report Authenticated By: Kennith Center, M.D.    Dg Abd 2 Views  11/03/2012  *RADIOLOGY REPORT*  Clinical Data: Follow up small bowel obstruction  ABDOMEN - 2 VIEW  Comparison: Prior KUB 11/02/2012  Findings: Similar appearance of multiple loops of air filled and dilated small bowel throughout the abdomen with multiple  small air fluid levels on the upright view.  Overall, findings are similar to slightly progressed.  No evidence of free air, or pneumatosis.  No acute osseous abnormality.  Visualized lung bases are clear.  IMPRESSION:  Similar to slightly progressed small bowel obstruction.   Original Report Authenticated By: Malachy Moan, M.D.     Scheduled Meds:    . albuterol  2.5 mg Nebulization BID  . aspirin EC  81 mg Oral Daily  . atorvastatin  80 mg Oral Daily  . budesonide-formoterol  2 puff Inhalation BID  . enoxaparin (LOVENOX) injection  30 mg Subcutaneous Q24H  . irbesartan  150 mg Oral Daily  . levofloxacin (LEVAQUIN) IV  500 mg Intravenous Daily  . methylPREDNISolone (SOLU-MEDROL) injection  60 mg Intravenous Q24H  . nystatin  5 mL Oral QID  . pantoprazole  40 mg Oral Q0600  . polyethylene glycol  17 g Oral Daily  . senna-docusate  1 tablet Oral BID  . tiotropium  18 mcg Inhalation Daily   Continuous Infusions:    . dextrose 5 % and 0.9% NaCl 50 mL/hr (11/04/12 0939)    Active Problems:  COPD with acute exacerbation  Anxiety  SOB (shortness of breath) on exertion  Hyponatremia  Anemia        Ilyas Lipsitz  [facility] Pager [phone]. If 8PM-8AM, please contact night-coverage at [email or web address], password [facility] 11/04/2012, 8:11 PM  LOS: 6 days

## 2012-11-05 ENCOUNTER — Inpatient Hospital Stay (HOSPITAL_COMMUNITY): Payer: Medicare Other

## 2012-11-05 ENCOUNTER — Encounter (HOSPITAL_COMMUNITY): Admission: RE | Admit: 2012-11-05 | Payer: Medicare Other | Source: Ambulatory Visit

## 2012-11-05 DIAGNOSIS — K56609 Unspecified intestinal obstruction, unspecified as to partial versus complete obstruction: Secondary | ICD-10-CM

## 2012-11-05 DIAGNOSIS — J438 Other emphysema: Secondary | ICD-10-CM

## 2012-11-05 DIAGNOSIS — Z01811 Encounter for preprocedural respiratory examination: Secondary | ICD-10-CM

## 2012-11-05 DIAGNOSIS — J439 Emphysema, unspecified: Secondary | ICD-10-CM

## 2012-11-05 HISTORY — DX: Emphysema, unspecified: J43.9

## 2012-11-05 LAB — BASIC METABOLIC PANEL
CO2: 33 mEq/L — ABNORMAL HIGH (ref 19–32)
Chloride: 98 mEq/L (ref 96–112)
GFR calc Af Amer: >90 mL/min (ref >90)
Sodium: 135 mEq/L (ref 135–145)

## 2012-11-05 LAB — GLUCOSE, CAPILLARY
Glucose-Capillary: 121 mg/dL — ABNORMAL HIGH (ref 70–99)
Glucose-Capillary: 92 mg/dL (ref 70–99)

## 2012-11-05 MED ORDER — IOHEXOL 300 MG/ML  SOLN
100.0000 mL | Freq: Once | INTRAMUSCULAR | Status: AC | PRN
  Administered 2012-11-05: 100 mL via INTRAVENOUS

## 2012-11-05 MED ORDER — ALBUTEROL SULFATE (5 MG/ML) 0.5% IN NEBU
2.5000 mg | INHALATION_SOLUTION | RESPIRATORY_TRACT | Status: DC | PRN
  Administered 2012-11-18 – 2012-11-23 (×3): 2.5 mg via RESPIRATORY_TRACT
  Filled 2012-11-05 (×8): qty 0.5

## 2012-11-05 MED ORDER — BUDESONIDE 0.25 MG/2ML IN SUSP
0.2500 mg | Freq: Four times a day (QID) | RESPIRATORY_TRACT | Status: DC
  Filled 2012-11-05 (×3): qty 2

## 2012-11-05 MED ORDER — IPRATROPIUM BROMIDE 0.02 % IN SOLN
0.5000 mg | Freq: Four times a day (QID) | RESPIRATORY_TRACT | Status: DC
  Administered 2012-11-05 – 2012-11-11 (×23): 0.5 mg via RESPIRATORY_TRACT
  Filled 2012-11-05 (×23): qty 2.5

## 2012-11-05 MED ORDER — ALBUTEROL SULFATE (5 MG/ML) 0.5% IN NEBU
2.5000 mg | INHALATION_SOLUTION | Freq: Four times a day (QID) | RESPIRATORY_TRACT | Status: DC
  Administered 2012-11-05 – 2012-11-29 (×83): 2.5 mg via RESPIRATORY_TRACT
  Filled 2012-11-05 (×87): qty 0.5

## 2012-11-05 MED ORDER — METHYLPREDNISOLONE SODIUM SUCC 40 MG IJ SOLR: 40.0000 mg | INTRAMUSCULAR | Status: DC

## 2012-11-05 MED ORDER — BUDESONIDE 0.25 MG/2ML IN SUSP
0.2500 mg | Freq: Four times a day (QID) | RESPIRATORY_TRACT | Status: DC
  Administered 2012-11-05 – 2012-11-08 (×9): 0.25 mg via RESPIRATORY_TRACT
  Filled 2012-11-05 (×17): qty 2

## 2012-11-05 NOTE — Evaluation (Signed)
SLP Cancellation Note  Patient Details Name: BEMNET TROVATO MRN: [id number] DOB: 1941-06-05   Cancelled treatment:       Reason Eval/Treat Not Completed: Medical issues which prohibited therapy pt continues npo.  Donavan Burnet, MS [facility] SLP [phone]

## 2012-11-05 NOTE — Progress Notes (Signed)
NUTRITION FOLLOW UP  Intervention:   1.  Nutrition support; recommend consideration of nutrition support in pt with minimal intake x1 week with expected ongoing NPO/bowel rest with SBO and plans for possible surgery later this week.   2.  Modify diet; per MD discretion once bowel function returns.  Nutrition Dx:   Suboptimal oral intake, ongoing  Monitor:   1.  Food/Beverage; intake >50% of meals.  Not met, pt now NPO r/t to ongoing SBO.  Assessment:   Pt admitted with shortness of breath.  RD consulted for assessment of nutrition status.  Pt reported consuming 2 small meals/day.  Early in admission, pt with decreased intake. Sips of liquids, few bites of meals.  Pt was ordered Ensure Complete, however with poor tolerance due to SBO. Pt has now been NPO x3 days with poor intake x1 week. Currently has NGT and continued bowel rest.   Note MD considering surgery later this week if cannot be avoided.    Radiology report notes persistent and slightly worsened small bowel obstruction. Given the presence of some distal rectal mass, this may be a partial obstruction. Planning for CT this afternoon.   Height: Ht Readings from Last 1 Encounters:  10/29/12 5\' 5"  (1.651 m)    Weight Status:   Wt Readings from Last 1 Encounters:  10/29/12 94 lb 12.8 oz (43 kg)    Re-estimated needs:  Kcal: 1200-1400 Protein: 43-522g Fluid: >1.2 L/day  Skin: intact  Diet Order: NPO   Intake/Output Summary (Last 24 hours) at 11/05/12 1338 Last data filed at 11/05/12 9562  Gross per 24 hour  Intake 1070.83 ml  Output   1145 ml  Net -74.17 ml    Last BM: 12/27   Labs:   Lab 11/05/12 0500 11/04/12 0445 11/03/12 0521  NA 135 135 132*  K 4.2 4.7 4.9  CL 98 97 96  CO2 33* 31 30  BUN 14 19 20   CREATININE 0.57 0.68 0.69  CALCIUM 9.1 9.4 8.7  MG -- -- --  PHOS -- -- --  GLUCOSE 101* 57* 93    CBG (last 3)   Basename 11/05/12 0757 11/05/12 0400 11/05/12 0103  GLUCAP 92 104* 121*     Scheduled Meds:   . aspirin EC  81 mg Oral Daily  . atorvastatin  80 mg Oral Daily  . budesonide-formoterol  2 puff Inhalation BID  . enoxaparin (LOVENOX) injection  30 mg Subcutaneous Q24H  . irbesartan  150 mg Oral Daily  . levalbuterol  0.63 mg Nebulization Q6H  . levofloxacin (LEVAQUIN) IV  500 mg Intravenous Daily  . methylPREDNISolone (SOLU-MEDROL) injection  60 mg Intravenous Q24H  . nystatin  5 mL Oral QID  . pantoprazole  40 mg Oral Q0600  . polyethylene glycol  17 g Oral Daily  . senna-docusate  1 tablet Oral BID  . tiotropium  18 mcg Inhalation Daily    Continuous Infusions:   . dextrose 5 % and 0.9% NaCl 50 mL/hr at 11/05/12 1308    Loyce Dys, MS RD LDN Clinical Inpatient Dietitian Pager: [phone] Weekend/After hours pager: [phone]

## 2012-11-05 NOTE — Progress Notes (Signed)
Speech Language Pathology Discharge Patient Details Name: [name] MRN: [id number] DOB: August 06, 1941 Today's Date: 11/05/2012 Time:  -     Patient discharged from SLP services secondary to md cancelled orders.  Please reorder if indicated..  Please see latest therapy progress note for current level of functioning and progress toward goals.    Progress and discharge plan discussed with patient and/or caregiver: Did not visit pt as order was cancelled by md.   GO     Donavan Burnet, MS [facility] SLP [phone]

## 2012-11-05 NOTE — Progress Notes (Signed)
Patient ID: [name], female   DOB: November 15, 1940, 71 y.o.   MRN: [id number]    Subjective: Pt feels about the same.  Minimal gas.  Objective: Vital signs in last 24 hours: Temp:  [98 F (36.7 C)-98.2 F (36.8 C)] 98 F (36.7 C) Dec 01, 2023 0607) Pulse Rate:  [80-96] 80  12-01-2023 0607) Resp:  [18-19] 18  December 01, 2023 0607) BP: (134-148)/(62-71) 139/71 mmHg 2023/12/01 0607) SpO2:  [82 %-100 %] 98 % 2023/12/01 0937) Last BM Date: 11/01/12  Intake/Output from previous day: 12/30 0701 - 01-Dec-2023 0700 In: 1070.8 [I.V.:970.8; IV Piggyback:100] Out: 1145 [Urine:1020; Emesis/NG output:125] Intake/Output this shift:    PE: Abd: soft, some distention, few BS, NT, NGT almost completely out of nose  Lab Results:   [facility] 11/03/12 0521  WBC 13.9*  HGB 11.8*  HCT 35.4*  PLT 209   BMET  Basename Nov 30, 2012 0500 11/04/12 0445  NA 135 135  K 4.2 4.7  CL 98 97  CO2 33* 31  GLUCOSE 101* 57*  BUN 14 19  CREATININE 0.57 0.68  CALCIUM 9.1 9.4   PT/INR No results found for this basename: LABPROT:2,INR:2 in the last 72 hours CMP     Component Value Date/Time   NA 135 11/30/2012 0500   K 4.2 11-30-12 0500   CL 98 November 30, 2012 0500   CO2 33* 11/30/2012 0500   GLUCOSE 101* 2012/11/30 0500   BUN 14 11-30-12 0500   CREATININE 0.57 11-30-2012 0500   CALCIUM 9.1 11/30/2012 0500   PROT 6.4 08/11/2008 0500   ALBUMIN 3.8 08/11/2008 0500   AST 46* 08/11/2008 0500   ALT 34 08/11/2008 0500   ALKPHOS 79 08/11/2008 0500   BILITOT 0.6 08/11/2008 0500   GFRNONAA >90 11-30-2012 0500   GFRAA >90 11/30/12 0500   Lipase  No results found for this basename: lipase       Studies/Results: Dg Abd 2 Views  November 30, 2012  *RADIOLOGY REPORT*  Clinical Data: Follow-up evaluation of small bowel obstruction.  ABDOMEN - 2 VIEW  Comparison: Chest trait 11/04/2012.  Findings: Previously noted nasogastric tube has been removed. There is a paucity of colonic gas, although there is increased rectal gas compared to  yesterday's examination.  Numerous dilated loops of small bowel are again noted, measuring up to approximately 5 cm in diameter.  Multiple air fluid levels are noted on the upright projection.  No definite pneumoperitoneum.  IMPRESSION: 1.  Findings, as above, compatible with persistent and slightly worsened small bowel obstruction.  Given the presence of some distal rectal mass, this may be a partial obstruction.   Original Report Authenticated By: Trudie Reed, M.D.    Dg Abd 2 Views  11/04/2012  *RADIOLOGY REPORT*  Clinical Data: Small bowel obstruction  ABDOMEN - 2 VIEW  Comparison: 11/03/2012  Findings: Upright film shows no evidence for intraperitoneal free air.  To NG tube tip is just distal to the esophagogastric junction.  Proximal port the NG tube is in the distal esophagus. Diffuse gaseous small bowel distention persists without substantial interval change.  Small bowel loops measure up to 4.1 cm in diameter.  IMPRESSION: No substantial interval change and diffuse gaseous small bowel dilatation consistent with obstruction.   Original Report Authenticated By: Kennith Center, M.D.     Anti-infectives: Anti-infectives     Start     Dose/Rate Route Frequency Ordered Stop   11/04/12 1200   levofloxacin (LEVAQUIN) IVPB 500 mg        500 mg 100 mL/hr over 60  Minutes Intravenous Daily 11/04/12 1124     11/01/12 1615   fluconazole (DIFLUCAN) tablet 200 mg        200 mg Oral  Once 11/01/12 1607 11/01/12 1636   11/01/12 1000   levofloxacin (LEVAQUIN) IVPB 750 mg  Status:  Discontinued        750 mg 100 mL/hr over 90 Minutes Intravenous Every 48 hours 10/30/12 1108 10/31/12 1428   11/01/12 1000   levofloxacin (LEVAQUIN) tablet 750 mg  Status:  Discontinued        750 mg Oral Every 48 hours 10/31/12 1428 11/04/12 1124   10/29/12 1045   levofloxacin (LEVAQUIN) IVPB 750 mg  Status:  Discontinued        750 mg 100 mL/hr over 90 Minutes Intravenous Every 24 hours 10/29/12 1034 10/30/12 1108             Assessment/Plan  1. SBO 2. End stage COPD  Plan: 1. Will order a CT today to better eval the SBO and to see if the oral contrast will help at all.  We would like to avoid an operation on her if at all possible, given her severe COPD. 2. Reposition NGT and cont bowel rest.   LOS: 7 days    Deborah Lazcano E 11/05/2012, 11:15 AM Pager: [phone]

## 2012-11-05 NOTE — Progress Notes (Signed)
Physical Therapy Treatment Patient Details Name: [name] MRN: [id number] DOB: Sep 27, 1941 Today's Date: 11/05/2012 Time: 4132-4401 PT Time Calculation (min): 28 min  PT Assessment / Plan / Recommendation Comments on Treatment Session  Pt now with NG tube due to SBO.  Amb pt in hallway.  Feeling better respiratory wise but now having GI issues. Pt plans to D/C to home.    Follow Up Recommendations  Home health PT     Does the patient have the potential to tolerate intense rehabilitation     Barriers to Discharge        Equipment Recommendations       Recommendations for Other Services    Frequency Min 3X/week   Plan Discharge plan remains appropriate    Precautions / Restrictions Precautions Precautions: Fall Precaution Comments: wears 2L O2 chronically, monitor O2 sats and now NG tube Restrictions Weight Bearing Restrictions: No   Pertinent Vitals/Pain C/o ABD distention    Mobility  Bed Mobility Bed Mobility: Supine to Sit Supine to Sit: 5: Supervision Sit to Supine: 5: Supervision Details for Bed Mobility Assistance: increased time Transfers Transfers: Sit to Stand;Stand to Sit Sit to Stand: 5: Supervision;From bed Stand to Sit: To bed;5: Supervision Details for Transfer Assistance: increased time Ambulation/Gait Ambulation/Gait Assistance: 4: Min guard Ambulation Distance (Feet): 155 Feet Assistive device: Rolling Pfluger Ambulation/Gait Assistance Details: On 2 lts O2 sats range 92 - 94% with one vs 4 standing rest breaks.  Gait Pattern: Step-through pattern Gait velocity: good gait speed    PT Goals                                    progressing    Visit Information  Last PT Received On: 11/05/12 Assistance Needed: +1    Subjective Data  Subjective: Let's try Patient Stated Goal: home   Cognition    good   Balance   good  End of Session PT - End of Session Equipment Utilized During Treatment: Gait belt Activity Tolerance: Patient  limited by pain;Patient limited by fatigue Patient left: in bed;with call bell/phone within reach   Felecia Shelling  PTA [facility]  Acute  Rehab Pager     [phone]

## 2012-11-05 NOTE — Progress Notes (Signed)
Agree with above.   Pt with no really improvement of her SBO Will obtain CT to see where the obstruction may lie Pt requesting to speak with her Pulmonologist in case she does have to go to the OR.  With how things have not progressed I would assume that she will need an operation later this week.

## 2012-11-05 NOTE — Progress Notes (Signed)
PULMONARY REHAB PROGRESS NOTE  Patients friend call our office around 4:50pm stating patient was in hospital and that she wanted to drop the program due to a long road of recovery ahead. Staff saw note in  Epic from hospital admission. Patient completed 5 exercisde sessions and did well for the most part. Patient did complete a pre 6 minute walk test and ambualated 600 ft with 2 rest breaks. We increased her oxygen to 3L with ambulation due to her sats dropping down to 83 on 2L NCC. Patient agrees to use 3L for remainder of test. Patient showed an RPE of 11 and Dyspnea score of 2 out of 4. Pateint never turned in her pre orientation paperwork. So no documentation of quality of life. Home exercise was given to patient on 10/15/2012. As mentioned above she completed 5 sessions with [place] in pulmonary rehab and showed some improvement with her breathing. Patient did not meet program goals and will need to return at a later time. Very pleasant lady.   Agree with the above note.  Cathie Olden RN

## 2012-11-05 NOTE — Progress Notes (Signed)
[facility] PROGRESS NOTE  JACQUELI PANGALLO ZOX:[id number] DOB: 1941/04/15 DOA: 10/29/2012 PCP: Mickie Hillier, MD  Assessment/Plan: 1. Acute on chronic respiratory failure: COPD EXACERBATION improving with IV steroids, on tapering dose. Continue with Q4hr nebs, oxygen,. Continue with symbicort and spiriva.   2. Anxiety: continue with xanax tid prn. And ativan for IV.   3. 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 TSH within normal limits.   4. Anemia: normocytic. Anemia panel shows ferritin of 115, low iron levels. She had a normal colonoscopy 8 years ago .  5. Abdominal distention abd films show dilated small bowel loops possible  sbo. Kept her npo, and NG tube placed.  Changed medications to IV route.  Will repeat abd films in am. Will continue to monitor.  Surgical consult called for further recommendations. CT ABD obtained showed obstruction in the distal ileum.   DVT prophylaxis.         Antibiotics:  levaquin  From 12 /24 >>>   HPI/Subjective: abd pain and bloating.   Objective: Filed Vitals:   11/05/12 0220 11/05/12 0607 11/05/12 0937 11/05/12 1431  BP:  139/71  169/82  Pulse:  80  94  Temp:  98 F (36.7 C)  98.5 F (36.9 C)  TempSrc:  Oral  Oral  Resp:  18  18  Height:      Weight:      SpO2: 98% 100% 98% 95%    Intake/Output Summary (Last 24 hours) at 11/05/12 1734 Last data filed at 11/05/12 1500  Gross per 24 hour  Intake   1200 ml  Output    845 ml  Net    355 ml   Filed Weights   10/29/12 1410  Weight: 43 kg (94 lb 12.8 oz)    Exam:   General:  Alert afebrile very anxious  Cardiovascular: s1s2  Respiratory: scattered wheezing on the right side  Abdomen: soft NT ND BS+  Data Reviewed: Basic Metabolic Panel:  Lab 11/05/12 8119 11/04/12 0445 11/03/12 0521 11/02/12 0516 11/01/12 0512  NA 135 135 132* 133* 137  K 4.2 4.7 4.9 4.5 4.3  CL 98 97 96 97 101  CO2 33* 31 30 29 30   GLUCOSE 101* 57* 93 116* 122*  BUN 14 19 20 18 18     CREATININE 0.57 0.68 0.69 0.60 0.64  CALCIUM 9.1 9.4 8.7 8.7 8.9  MG -- -- -- -- --  PHOS -- -- -- -- --   Liver Function Tests: No results found for this basename: AST:5,ALT:5,ALKPHOS:5,BILITOT:5,PROT:5,ALBUMIN:5 in the last 168 hours No results found for this basename: LIPASE:5,AMYLASE:5 in the last 168 hours No results found for this basename: AMMONIA:5 in the last 168 hours CBC:  Lab 11/03/12 0521 10/31/12 1300 10/30/12 0330  WBC 13.9* 12.5* 8.0  NEUTROABS -- -- --  HGB 11.8* 11.1* 9.9*  HCT 35.4* 32.1* 29.8*  MCV 94.7 92.0 94.6  PLT 209 226 209   Cardiac Enzymes:  Lab 10/30/12 0330 10/29/12 2109  CKTOTAL -- --  CKMB -- --  CKMBINDEX -- --  TROPONINI <0.30 <0.30   BNP (last 3 results)  Basename 10/29/12 0935  PROBNP 352.9*   CBG:  Lab 11/05/12 0757 11/05/12 0400 11/05/12 0103  GLUCAP 92 104* 121*    No results found for this or any previous visit (from the past 240 hour(s)).   Studies: Ct Abdomen Pelvis W Contrast  11/05/2012  **ADDENDUM** CREATED: 11/05/2012 14:52:43  The transition point of the small bowel obstruction is in the region of the  mid-distal ileum rather than the distal ileum as initially dictated.  **END ADDENDUM** SIGNED BY: Almedia Balls. Constance Goltz, M.D.   11/05/2012  *RADIOLOGY REPORT*  Clinical Data: Lower abdominal pain.  Bloating for 3-4 years. Ovarian cancer diagnosed 1989.  Post total hysterectomy.  Question rectal mass.  CT ABDOMEN AND PELVIS WITH CONTRAST  Technique:  Multidetector CT imaging of the abdomen and pelvis was performed following the standard protocol during bolus administration of intravenous contrast.  Contrast: OMNIPAQUE IOHEXOL 300 MG/ML  SOLN  Comparison: 11/05/2012 plain film examination.  01/04/2004 CT.  Findings: Gas and fluid filled dilated small bowel loops to the level of the distal ileum.  There is an abrupt change caliber of the distal ileum in the pelvis without mass identified.  This may be related to adhesions in this  patient who has had a prior hysterectomy.  Fluid in the pelvis and surrounding the liver without nodularity as may be expected if this were related to the recurrent ovarian tumor.  This fluid is therefore may be related to the bowel obstruction.  Presently, no evidence of pneumatosis or free intraperitoneal air.  No evidence of rectal mass.  Decompressed colon.  The ligament of Treitz is located  midline.  This may be related to ligamentum laxity rather than malrotation.  Separate origin of the hepatic artery and splenic artery from the aorta.  Atherosclerotic type changes of the aorta with ectasia. Atherosclerotic type changes iliac arteries and femoral arteries.  Pleural effusion and basilar atelectasis greater on the right.  Tiny low density structure within the left lobe liver and right kidney may be cysts although too small to adequately characterize. No worrisome hepatic, splenic, pancreatic, adrenal or renal lesion.  Mild enhancement of the fundus of the gallbladder.  No calcified gallstone.  No bony destructive lesion.  No adenopathy.  Small hiatal hernia.  IMPRESSION: Small bowel obstruction with dilated small bowel to the level of the distal ileum in the region of the pelvis where there is abrupt change of caliber which may be related to adhesions.  Please see above discussion.  This has been made a PRA call report utilizing dashboard call feature.   Original Report Authenticated By: Lacy Duverney, M.D.    Dg Abd 2 Views  11/05/2012  **ADDENDUM** CREATED: 11/05/2012 14:36:08  In the original dictation for this examination, there is a voice recognition error in the conclusion.  The impression should read:  1.  Findings, as above, compatible with persistent and slightly worsened small bowel obstruction.  Given the presence of some distal rectal GAS, this may be a partial obstruction.  **END ADDENDUM** SIGNED BY: Florencia Reasons, M.D.   11/05/2012  *RADIOLOGY REPORT*  Clinical Data: Follow-up  evaluation of small bowel obstruction.  ABDOMEN - 2 VIEW  Comparison: Chest trait 11/04/2012.  Findings: Previously noted nasogastric tube has been removed. There is a paucity of colonic gas, although there is increased rectal gas compared to yesterday's examination.  Numerous dilated loops of small bowel are again noted, measuring up to approximately 5 cm in diameter.  Multiple air fluid levels are noted on the upright projection.  No definite pneumoperitoneum.  IMPRESSION: 1.  Findings, as above, compatible with persistent and slightly worsened small bowel obstruction.  Given the presence of some distal rectal mass, this may be a partial obstruction.   Original Report Authenticated By: Trudie Reed, M.D.    Dg Abd 2 Views  11/04/2012  *RADIOLOGY REPORT*  Clinical Data: Small bowel obstruction  ABDOMEN -  2 VIEW  Comparison: 11/03/2012  Findings: Upright film shows no evidence for intraperitoneal free air.  To NG tube tip is just distal to the esophagogastric junction.  Proximal port the NG tube is in the distal esophagus. Diffuse gaseous small bowel distention persists without substantial interval change.  Small bowel loops measure up to 4.1 cm in diameter.  IMPRESSION: No substantial interval change and diffuse gaseous small bowel dilatation consistent with obstruction.   Original Report Authenticated By: Kennith Center, M.D.     Scheduled Meds:    . albuterol  2.5 mg Nebulization Q6H  . aspirin EC  81 mg Oral Daily  . atorvastatin  80 mg Oral Daily  . budesonide  0.25 mg Nebulization Q6H  . enoxaparin (LOVENOX) injection  30 mg Subcutaneous Q24H  . ipratropium  0.5 mg Nebulization Q6H  . irbesartan  150 mg Oral Daily  . nystatin  5 mL Oral QID  . pantoprazole  40 mg Oral Q0600  . polyethylene glycol  17 g Oral Daily  . senna-docusate  1 tablet Oral BID   Continuous Infusions:    . dextrose 5 % and 0.9% NaCl 50 mL/hr at 11/05/12 1610    Active Problems:  COPD with acute exacerbation   Anxiety  SOB (shortness of breath) on exertion  Hyponatremia  Anemia  SBO (small bowel obstruction)  Emphysema  Pre-operative respiratory examination        [facility]  [facility] Pager [phone]. If 8PM-8AM, please contact night-coverage at [email or web address], password [facility] 11/05/2012, 5:34 PM  LOS: 7 days

## 2012-11-05 NOTE — Consult Note (Signed)
PULMONARY/CCM CONSULT NOTE  Requesting MD/Service: CCS/Ramirez Date of admission: 12/24 Date of consult: 12/31 Reason for consultation: COPD, pre-op pulmonary eval  Pt Profile:  35 yowf admitted 12/24 with dx of aecopd and treated accordingly with improvement in respiratory symptoms. She developed progressive abdominal distention with KUB and CT abd c/w SBO. Pre-op pulmonary evaluation requested     HPI:  As above. At her baseline, she is able to perform ADLs including grocery shopping and housework. She struggles with inclines and stairs. She has had no recent purulent sputum or hemoptysis. Denies LE edema and calf tenderness. No CP of any sort   Past Medical History  Diagnosis Date  . Emphysema   . 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 Hyperlipidemia   . Hypertension     MEDICATIONS: reviewed  History   Social History  . Marital Status: Single    Spouse Name: N/A    Number of Children: N/A  . Years of Education: N/A   Occupational History  . Not on file.   Social History Main Topics  . Smoking status: Former Smoker -- 1.0 packs/day for 30 years    Types: Cigarettes    Quit date: 11/06/1992  . Smokeless tobacco: Never Used  . Alcohol Use: No  . Drug Use: No  . Sexually Active: No   Other Topics Concern  . Not on file   Social History Narrative  . No narrative on file    Family History  Problem Relation Age of Onset  . Heart failure Mother   . Heart attack Father   . Heart attack Paternal Grandmother     ROS - as per HPI. Otherwise a detailed ROS is negative. She denies significant abdominal pain, N/V/D and thinks her BMs were normal prior to admission  Filed Vitals:   11/05/12 0220 11/05/12 0607 11/05/12 0937 11/05/12 1431  BP:  139/71  169/82  Pulse:  80  94  Temp:  98 F (36.7 C)  98.5 F (36.9 C)  TempSrc:  Oral  Oral  Resp:  18  18  Height:      Weight:      SpO2: 98% 100% 98% 95%    EXAM:  Gen: somewhat frail, pleasant, NAD HEENT: WNL Neck: no  JVD Lungs: moderately diminished, no wheezes Cardiovascular: RRR s M Abdomen: markedly distended and tympanitic without BS present Ext: diffuse UE ecchymoses, no edema Neuro: intact   DATA:  BMET    Component Value Date/Time   NA 135 11/05/2012 0500   K 4.2 11/05/2012 0500   CL 98 11/05/2012 0500   CO2 33* 11/05/2012 0500   GLUCOSE 101* 11/05/2012 0500   BUN 14 11/05/2012 0500   CREATININE 0.57 11/05/2012 0500   CALCIUM 9.1 11/05/2012 0500   GFRNONAA >90 11/05/2012 0500   GFRAA >90 11/05/2012 0500    CBC    Component Value Date/Time   WBC 13.9* 11/03/2012 0521   RBC 3.74* 11/03/2012 0521   HGB 11.8* 11/03/2012 0521   HCT 35.4* 11/03/2012 0521   PLT 209 11/03/2012 0521   MCV 94.7 11/03/2012 0521   MCH 31.6 11/03/2012 0521   MCHC 33.3 11/03/2012 0521   RDW 13.1 11/03/2012 0521   LYMPHSABS 0.7 08/15/2008 0550   MONOABS 0.2 08/15/2008 0550   EOSABS 0.0 08/15/2008 0550   BASOSABS 0.0 08/15/2008 0550     CXR: emphysema, NAD    IMPRESSION:    1) COPD - predominantly emphysema, O2 dependent. No PFTs available. Moderate to  severe by history 2) Pre-op pulmonary eval - possible ex lap for nonresolving SBO   DISC: Her pulmonary status is at or near baseline. There are no interventions that can improve her risk of perioperative pulmonary complications. The proposed procedure carries inherent small to moderate risk of major post op pulm complications. Overall her risk is moderate to high but not prohibitive. I have emphasized to her that the longer we wait, the worse her nutritional status becomes and the more deconditioned she will be. These factors would significantly increase her perioperative risk substantially. She has been on systemic steroids since admission for aecopd. These probably increase risk of wound healing problems and possibly increase risk of post op infectious problems.  PLAN:  I have changed her inhaled meds to nebulized form (triple nebs) and these  should be continued peri-operatively D/C systemic steroids in absence of wheezing and in anticipation of possible surgery  We will be present post operatively to assist in mgmt of post op pulmonary issues Would wean as usual post operatively and extubate when she appears ready She is not on steroids chronically or frequently and therefore should not require perioperative stress dose steroids    I have discussed with Dr Abbey Chatters (covering for Dr Derrell Lolling) and shared the above thoughts Please call us when there is a scheduled procedure date and time  Billy Fischer, MD ; [facility] service Mobile [phone].  After 5:30 PM or weekends, call [phone]

## 2012-11-05 NOTE — Progress Notes (Signed)
OT Note:  Attempted tx twice today.  Pt fatiqued this am and is now just back from CT scan.  Will  Check back another day.  [place], [place] [phone] 11/05/2012

## 2012-11-06 ENCOUNTER — Inpatient Hospital Stay (HOSPITAL_COMMUNITY): Payer: Medicare Other

## 2012-11-06 LAB — PHOSPHORUS: Phosphorus: 3.2 mg/dL (ref 2.3–4.6)

## 2012-11-06 LAB — CBC
Hemoglobin: 10.1 g/dL — ABNORMAL LOW (ref 12.0–15.0)
MCH: 31.9 pg (ref 26.0–34.0)
MCHC: 33.3 g/dL (ref 30.0–36.0)
RDW: 12.9 % (ref 11.5–15.5)

## 2012-11-06 LAB — BASIC METABOLIC PANEL
BUN: 11 mg/dL (ref 6–23)
Calcium: 9 mg/dL (ref 8.4–10.5)
Creatinine, Ser: 0.5 mg/dL (ref 0.50–1.10)
GFR calc non Af Amer: >90 mL/min (ref >90)
Glucose, Bld: 115 mg/dL — ABNORMAL HIGH (ref 70–99)

## 2012-11-06 MED ORDER — KCL IN DEXTROSE-NACL 40-5-0.45 MEQ/L-%-% IV SOLN
INTRAVENOUS | Status: DC
  Administered 2012-11-06: 16:00:00 via INTRAVENOUS
  Filled 2012-11-06 (×2): qty 1000

## 2012-11-06 MED ORDER — PANTOPRAZOLE SODIUM 40 MG IV SOLR
40.0000 mg | INTRAVENOUS | Status: DC
  Administered 2012-11-06 – 2012-11-09 (×4): 40 mg via INTRAVENOUS
  Filled 2012-11-06 (×6): qty 40

## 2012-11-06 NOTE — Progress Notes (Signed)
[facility] PROGRESS NOTE  [name] ZOX:[id number] DOB: 1940/12/07 DOA: 10/29/2012 PCP: Mickie Hillier, MD  Assessment/Plan: 1. Acute on chronic respiratory failure: COPD EXACERBATION -Improved and at baseline now. -following pulmonary rec's; will start on triple nebulizer medications (pulmicort, atrovent and albuterol) -stop steroids -continue oxygen supplementation.  2. Anxiety: continue with IV ativan as needed.   3. 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 Resolved and stable now. Will monitor.    4. Anemia: normocytic. Anemia panel shows ferritin of 115, low iron levels. She had a normal colonoscopy 8 years ago. Once patient able to take PO will start iron supplementation. Hgb stable and no needs for transfusion at this point.  5. Abdominal distention: x-ray demonstrating SBO unchanged. NGT with blachish/fecal appereance content on suction. No BM and no BS on physical exam. Patient might require surgery to fixed SBO. Surgery on board will follow recommendations. Continue NPO, electrolytes repletion to try to maintain potassium above 4.0. Check Mg and phosphorus.  6-HTN: stable overall. Continue PRN IV hydralazine while NPO.  DVT prophylaxis: lovenox.  Antibiotics:  levaquin  From 12 /24 >>>   HPI/Subjective: Patient w/o BM's afebrile and very anxious about her condition and needed tx.  Objective: Filed Vitals:   11/06/12 0536 11/06/12 0814 11/06/12 0900 11/06/12 0902  BP: 148/72     Pulse: 75     Temp: 98.1 F (36.7 C)     TempSrc: Oral     Resp: 18     Height:      Weight:      SpO2: 100% 99% 85% 94%    Intake/Output Summary (Last 24 hours) at 11/06/12 1344 Last data filed at 11/06/12 0700  Gross per 24 hour  Intake 1296.67 ml  Output    375 ml  Net 921.67 ml   Filed Weights   10/29/12 1410  Weight: 43 kg (94 lb 12.8 oz)    Exam:   General:  Alert, afebrile, anxious and w/o BM  yet  Cardiovascular: s1s2  Respiratory: no wheezing, no worsening SOB, no crackles  Abdomen: soft, NT, mild distension, no BS;   Neuro: non focal.  Data Reviewed: Basic Metabolic Panel:  Lab 11/06/12 8119 11/05/12 0500 11/04/12 0445 11/03/12 0521 11/02/12 0516  NA 134* 135 135 132* 133*  K 3.5 4.2 4.7 4.9 4.5  CL 97 98 97 96 97  CO2 31 33* 31 30 29   GLUCOSE 115* 101* 57* 93 116*  BUN 11 14 19 20 18   CREATININE 0.50 0.57 0.68 0.69 0.60  CALCIUM 9.0 9.1 9.4 8.7 8.7  MG -- -- -- -- --  PHOS -- -- -- -- --   CBC:  Lab 11/06/12 0505 11/03/12 0521 10/31/12 1300  WBC 9.9 13.9* 12.5*  NEUTROABS -- -- --  HGB 10.1* 11.8* 11.1*  HCT 30.3* 35.4* 32.1*  MCV 95.6 94.7 92.0  PLT 216 209 226   BNP (last 3 results)  Basename 10/29/12 0935  PROBNP 352.9*   CBG:  Lab 11/05/12 0757 11/05/12 0400 11/05/12 0103  GLUCAP 92 104* 121*    Studies: Dg Abd 1 View  11/06/2012  *RADIOLOGY REPORT*  Clinical Data: The NG tube placement  ABDOMEN - 1 VIEW  Comparison: Earlier the same day  Findings: 1115 hours.  NG tube has been advanced in the interval. The tip overlies the proximal stomach with proximal port of the NG tube now positioned at the esophagogastric junction.  The diffuse gaseous small bowel  dilatation persists.  IMPRESSION: NG tube tip is now in the proximal stomach with the side port of the NG tube at the EG junction.  Persistent diffuse gaseous small bowel dilatation consistent with obstruction.   Original Report Authenticated By: Kennith Center, M.D.    Ct Abdomen Pelvis W Contrast  11/05/2012  **ADDENDUM** CREATED: 11/05/2012 14:52:43  The transition point of the small bowel obstruction is in the region of the mid-distal ileum rather than the distal ileum as initially dictated.  **END ADDENDUM** SIGNED BY: Almedia Balls. Constance Goltz, M.D.   11/05/2012  *RADIOLOGY REPORT*  Clinical Data: Lower abdominal pain.  Bloating for 3-4 years. Ovarian cancer diagnosed 1989.  Post total hysterectomy.   Question rectal mass.  CT ABDOMEN AND PELVIS WITH CONTRAST  Technique:  Multidetector CT imaging of the abdomen and pelvis was performed following the standard protocol during bolus administration of intravenous contrast.  Contrast: OMNIPAQUE IOHEXOL 300 MG/ML  SOLN  Comparison: 11/05/2012 plain film examination.  01/04/2004 CT.  Findings: Gas and fluid filled dilated small bowel loops to the level of the distal ileum.  There is an abrupt change caliber of the distal ileum in the pelvis without mass identified.  This may be related to adhesions in this patient who has had a prior hysterectomy.  Fluid in the pelvis and surrounding the liver without nodularity as may be expected if this were related to the recurrent ovarian tumor.  This fluid is therefore may be related to the bowel obstruction.  Presently, no evidence of pneumatosis or free intraperitoneal air.  No evidence of rectal mass.  Decompressed colon.  The ligament of Treitz is located  midline.  This may be related to ligamentum laxity rather than malrotation.  Separate origin of the hepatic artery and splenic artery from the aorta.  Atherosclerotic type changes of the aorta with ectasia. Atherosclerotic type changes iliac arteries and femoral arteries.  Pleural effusion and basilar atelectasis greater on the right.  Tiny low density structure within the left lobe liver and right kidney may be cysts although too small to adequately characterize. No worrisome hepatic, splenic, pancreatic, adrenal or renal lesion.  Mild enhancement of the fundus of the gallbladder.  No calcified gallstone.  No bony destructive lesion.  No adenopathy.  Small hiatal hernia.  IMPRESSION: Small bowel obstruction with dilated small bowel to the level of the distal ileum in the region of the pelvis where there is abrupt change of caliber which may be related to adhesions.  Please see above discussion.  This has been made a PRA call report utilizing dashboard call feature.    Original Report Authenticated By: Lacy Duverney, M.D.    Dg Abd 2 Views  11/06/2012  *RADIOLOGY REPORT*  Clinical Data: Abdominal distention.  ABDOMEN - 2 VIEW  Comparison: 11/05/2012  Findings: Upright film shows no evidence for intraperitoneal free air.  There is marked diffuse small bowel dilatation measuring up to 5.0 cm in diameter.  Although the distribution of bowel loops is slightly different in the interval, there is been no substantial improvement or progression since the prior study.  Of note, the NG tube tip is in the distal esophagus.  IMPRESSION: Persistent diffuse gaseous small bowel dilatation with air fluid levels, consistent with obstruction.  No substantial interval change.  NG tube tip is in the distal esophagus.  I personally called these results to the patient's nurse, Toniann Fail, at 1005 hours on 10/06/2013.   Original Report Authenticated By: Kennith Center, M.D.  Dg Abd 2 Views  11/05/2012  **ADDENDUM** CREATED: 11/05/2012 14:36:08  In the original dictation for this examination, there is a voice recognition error in the conclusion.  The impression should read:  1.  Findings, as above, compatible with persistent and slightly worsened small bowel obstruction.  Given the presence of some distal rectal GAS, this may be a partial obstruction.  **END ADDENDUM** SIGNED BY: Florencia Reasons, M.D.   11/05/2012  *RADIOLOGY REPORT*  Clinical Data: Follow-up evaluation of small bowel obstruction.  ABDOMEN - 2 VIEW  Comparison: Chest trait 11/04/2012.  Findings: Previously noted nasogastric tube has been removed. There is a paucity of colonic gas, although there is increased rectal gas compared to yesterday's examination.  Numerous dilated loops of small bowel are again noted, measuring up to approximately 5 cm in diameter.  Multiple air fluid levels are noted on the upright projection.  No definite pneumoperitoneum.  IMPRESSION: 1.  Findings, as above, compatible with persistent and slightly  worsened small bowel obstruction.  Given the presence of some distal rectal mass, this may be a partial obstruction.   Original Report Authenticated By: Trudie Reed, M.D.     Scheduled Meds:    . albuterol  2.5 mg Nebulization Q6H  . aspirin EC  81 mg Oral Daily  . atorvastatin  80 mg Oral Daily  . budesonide  0.25 mg Nebulization Q6H  . enoxaparin (LOVENOX) injection  30 mg Subcutaneous Q24H  . ipratropium  0.5 mg Nebulization Q6H  . irbesartan  150 mg Oral Daily  . nystatin  5 mL Oral QID  . pantoprazole  40 mg Oral Q0600  . polyethylene glycol  17 g Oral Daily  . senna-docusate  1 tablet Oral BID   Continuous Infusions:    . dextrose 5 % and 0.9% NaCl 50 mL/hr at 11/06/12 0016    Active Problems:  COPD with acute exacerbation  Anxiety  SOB (shortness of breath) on exertion  Hyponatremia  Anemia  SBO (small bowel obstruction)  Emphysema  Pre-operative respiratory examination        Damichael Hofman  [facility] Pager [phone]. If 8PM-8AM, please contact night-coverage at [email or web address], password [email or web address] 11/06/2012, 1:44 PM  LOS: 8 days

## 2012-11-06 NOTE — Progress Notes (Signed)
Writer received call from Radiologist stating that abdominal x-rays showed SBO to be about the same as yesterday's x-ray. NG-tube is sitting in distal esophagus. Writer told C. Gwenlyn Perking, MD, pt's hospitalist, who is on floor at this time.  Instructions given to advance tube 3" and get another x-ray of abdomen. Writer completed task. Increased green drainage noted to suction tubing. Awaiting x-ray and results. Pt tolerated procedure well.

## 2012-11-06 NOTE — Progress Notes (Signed)
Pt is requesting to see her Pulmonologist, Dr. Shelle Iron of [place], or Dr. Sharol Harness prior to consenting to surgery.  MD/hospitalist please advise pt/pulmonology.

## 2012-11-06 NOTE — Progress Notes (Signed)
Patient ID: [name], female   DOB: [birth year], 72 y.o.   MRN: [id number] [facility] Progress Note:   * No surgery found *  Subjective: Mental status is clear.  Patient sitting in bed with NG in place.  Has been reluctant to have surgery because of her pulmonary issues with COPD.  [name] is her pulmonologist.   Objective: Vital signs in last 24 hours: Temp:  [98 F (36.7 C)-98.5 F (36.9 C)] 98.1 F (36.7 C) (01/01 0536) Pulse Rate:  [75-94] 75  (01/01 0536) Resp:  [18] 18  (01/01 0536) BP: (133-169)/(72-88) 148/72 mmHg (01/01 0536) SpO2:  [95 %-100 %] 99 % (01/01 0814)  Intake/Output from previous day: 12/31 0701 - 01/01 0700 In: 1296.7 [I.V.:1296.7] Out: 375 [Urine:350; Emesis/NG output:25] Intake/Output this shift:    Physical Exam: Distended but patient reports that this is less.  Small amount of flatus yesterday.  No tenderness or rebound  Lab Results:  Results for orders placed during the hospital encounter of 10/29/12 (from the past 48 hour(s))  GLUCOSE, CAPILLARY     Status: Abnormal   Collection Time   11/05/12  1:03 AM      Component Value Range Comment   Glucose-Capillary 121 (*) 70 - 99 mg/dL    Comment 1 Notify RN     GLUCOSE, CAPILLARY     Status: Abnormal   Collection Time   11/05/12  4:00 AM      Component Value Range Comment   Glucose-Capillary 104 (*) 70 - 99 mg/dL    Comment 1 Notify RN     BASIC METABOLIC PANEL     Status: Abnormal   Collection Time   11/05/12  5:00 AM      Component Value Range Comment   Sodium 135  135 - 145 mEq/L    Potassium 4.2  3.5 - 5.1 mEq/L    Chloride 98  96 - 112 mEq/L    CO2 33 (*) 19 - 32 mEq/L    Glucose, Bld 101 (*) 70 - 99 mg/dL    BUN 14  6 - 23 mg/dL    Creatinine, Ser 4.09  0.50 - 1.10 mg/dL    Calcium 9.1  8.4 - 81.1 mg/dL    GFR calc non Af Amer >90  >90 mL/min    GFR calc Af Amer >90  >90 mL/min   GLUCOSE, CAPILLARY     Status: Normal   Collection Time   11/05/12  7:57 AM   Component Value Range Comment   Glucose-Capillary 92  70 - 99 mg/dL   BASIC METABOLIC PANEL     Status: Abnormal   Collection Time   11/06/12  5:05 AM      Component Value Range Comment   Sodium 134 (*) 135 - 145 mEq/L    Potassium 3.5  3.5 - 5.1 mEq/L    Chloride 97  96 - 112 mEq/L    CO2 31  19 - 32 mEq/L    Glucose, Bld 115 (*) 70 - 99 mg/dL    BUN 11  6 - 23 mg/dL    Creatinine, Ser 9.14  0.50 - 1.10 mg/dL    Calcium 9.0  8.4 - 78.2 mg/dL    GFR calc non Af Amer >90  >90 mL/min    GFR calc Af Amer >90  >90 mL/min   CBC     Status: Abnormal   Collection Time   11/06/12  5:05 AM  Component Value Range Comment   WBC 9.9  4.0 - 10.5 K/uL    RBC 3.17 (*) 3.87 - 5.11 MIL/uL    Hemoglobin 10.1 (*) 12.0 - 15.0 g/dL    HCT 16.1 (*) 09.6 - 46.0 %    MCV 95.6  78.0 - 100.0 fL    MCH 31.9  26.0 - 34.0 pg    MCHC 33.3  30.0 - 36.0 g/dL    RDW 04.5  40.9 - 81.1 %    Platelets 216  150 - 400 K/uL     Radiology/Results: Ct Abdomen Pelvis W Contrast  11/05/2012  **ADDENDUM** CREATED: 11/05/2012 14:52:43  The transition point of the small bowel obstruction is in the region of the mid-distal ileum rather than the distal ileum as initially dictated.  **END ADDENDUM** SIGNED BY: Almedia Balls. Constance Goltz, M.D.   11/05/2012  *RADIOLOGY REPORT*  Clinical Data: Lower abdominal pain.  Bloating for 3-4 years. Ovarian cancer diagnosed 1989.  Post total hysterectomy.  Question rectal mass.  CT ABDOMEN AND PELVIS WITH CONTRAST  Technique:  Multidetector CT imaging of the abdomen and pelvis was performed following the standard protocol during bolus administration of intravenous contrast.  Contrast: OMNIPAQUE IOHEXOL 300 MG/ML  SOLN  Comparison: 11/05/2012 plain film examination.  01/04/2004 CT.  Findings: Gas and fluid filled dilated small bowel loops to the level of the distal ileum.  There is an abrupt change caliber of the distal ileum in the pelvis without mass identified.  This may be related to adhesions  in this patient who has had a prior hysterectomy.  Fluid in the pelvis and surrounding the liver without nodularity as may be expected if this were related to the recurrent ovarian tumor.  This fluid is therefore may be related to the bowel obstruction.  Presently, no evidence of pneumatosis or free intraperitoneal air.  No evidence of rectal mass.  Decompressed colon.  The ligament of Treitz is located  midline.  This may be related to ligamentum laxity rather than malrotation.  Separate origin of the hepatic artery and splenic artery from the aorta.  Atherosclerotic type changes of the aorta with ectasia. Atherosclerotic type changes iliac arteries and femoral arteries.  Pleural effusion and basilar atelectasis greater on the right.  Tiny low density structure within the left lobe liver and right kidney may be cysts although too small to adequately characterize. No worrisome hepatic, splenic, pancreatic, adrenal or renal lesion.  Mild enhancement of the fundus of the gallbladder.  No calcified gallstone.  No bony destructive lesion.  No adenopathy.  Small hiatal hernia.  IMPRESSION: Small bowel obstruction with dilated small bowel to the level of the distal ileum in the region of the pelvis where there is abrupt change of caliber which may be related to adhesions.  Please see above discussion.  This has been made a PRA call report utilizing dashboard call feature.   Original Report Authenticated By: Lacy Duverney, M.D.    Dg Abd 2 Views  11/05/2012  **ADDENDUM** CREATED: 11/05/2012 14:36:08  In the original dictation for this examination, there is a voice recognition error in the conclusion.  The impression should read:  1.  Findings, as above, compatible with persistent and slightly worsened small bowel obstruction.  Given the presence of some distal rectal GAS, this may be a partial obstruction.  **END ADDENDUM** SIGNED BY: Florencia Reasons, M.D.   11/05/2012  *RADIOLOGY REPORT*  Clinical Data: Follow-up  evaluation of small bowel obstruction.  ABDOMEN - 2  VIEW  Comparison: Chest trait 11/04/2012.  Findings: Previously noted nasogastric tube has been removed. There is a paucity of colonic gas, although there is increased rectal gas compared to yesterday's examination.  Numerous dilated loops of small bowel are again noted, measuring up to approximately 5 cm in diameter.  Multiple air fluid levels are noted on the upright projection.  No definite pneumoperitoneum.  IMPRESSION: 1.  Findings, as above, compatible with persistent and slightly worsened small bowel obstruction.  Given the presence of some distal rectal mass, this may be a partial obstruction.   Original Report Authenticated By: Trudie Reed, M.D.    Dg Abd 2 Views  11/04/2012  *RADIOLOGY REPORT*  Clinical Data: Small bowel obstruction  ABDOMEN - 2 VIEW  Comparison: 11/03/2012  Findings: Upright film shows no evidence for intraperitoneal free air.  To NG tube tip is just distal to the esophagogastric junction.  Proximal port the NG tube is in the distal esophagus. Diffuse gaseous small bowel distention persists without substantial interval change.  Small bowel loops measure up to 4.1 cm in diameter.  IMPRESSION: No substantial interval change and diffuse gaseous small bowel dilatation consistent with obstruction.   Original Report Authenticated By: Kennith Center, M.D.     Anti-infectives: Anti-infectives     Start     Dose/Rate Route Frequency Ordered Stop   11/04/12 1200   levofloxacin (LEVAQUIN) IVPB 500 mg  Status:  Discontinued        500 mg 100 mL/hr over 60 Minutes Intravenous Daily 11/04/12 1124 11/05/12 1436   11/01/12 1615   fluconazole (DIFLUCAN) tablet 200 mg        200 mg Oral  Once 11/01/12 1607 11/01/12 1636   11/01/12 1000   levofloxacin (LEVAQUIN) IVPB 750 mg  Status:  Discontinued        750 mg 100 mL/hr over 90 Minutes Intravenous Every 48 hours 10/30/12 1108 10/31/12 1428   11/01/12 1000   levofloxacin (LEVAQUIN)  tablet 750 mg  Status:  Discontinued        750 mg Oral Every 48 hours 10/31/12 1428 11/04/12 1124   10/29/12 1045   levofloxacin (LEVAQUIN) IVPB 750 mg  Status:  Discontinued        750 mg 100 mL/hr over 90 Minutes Intravenous Every 24 hours 10/29/12 1034 10/30/12 1108          Assessment/Plan: Problem List: Patient Active Problem List  Diagnosis  . EMPHYSEMA  . COPD with acute exacerbation  . Anxiety  . SOB (shortness of breath) on exertion  . Hyponatremia  . Anemia  . SBO (small bowel obstruction)  . Emphysema  . Pre-operative respiratory examination    Asked about seeing Dr. Derrell Lolling with whom she had consulted several years ago about diverticulitis.  Advised to try to get up and walk and rock in chair.  Will recheck xrays today.   * No surgery found *    LOS: 8 days   Matt B. Daphine Deutscher, MD, [facility] [phone] beeper [phone]  11/06/2012 8:43 AM

## 2012-11-07 ENCOUNTER — Encounter (HOSPITAL_COMMUNITY): Payer: Self-pay | Admitting: Anesthesiology

## 2012-11-07 ENCOUNTER — Inpatient Hospital Stay (HOSPITAL_COMMUNITY): Payer: Medicare Other

## 2012-11-07 ENCOUNTER — Encounter (HOSPITAL_COMMUNITY): Payer: Medicare Other

## 2012-11-07 ENCOUNTER — Encounter (HOSPITAL_COMMUNITY): Payer: Self-pay

## 2012-11-07 ENCOUNTER — Encounter (HOSPITAL_COMMUNITY)
Admission: EM | Disposition: A | Discharge status: Skilled Nursing Facility | Payer: Self-pay | Source: Home / Self Care | Attending: Pulmonary Disease

## 2012-11-07 ENCOUNTER — Inpatient Hospital Stay (HOSPITAL_COMMUNITY): Payer: Medicare Other | Admitting: Anesthesiology

## 2012-11-07 DIAGNOSIS — J95821 Acute postprocedural respiratory failure: Secondary | ICD-10-CM | POA: Diagnosis not present

## 2012-11-07 DIAGNOSIS — Z8679 Personal history of other diseases of the circulatory system: Secondary | ICD-10-CM

## 2012-11-07 DIAGNOSIS — Z8543 Personal history of malignant neoplasm of ovary: Secondary | ICD-10-CM

## 2012-11-07 DIAGNOSIS — Z8639 Personal history of other endocrine, nutritional and metabolic disease: Secondary | ICD-10-CM | POA: Diagnosis present

## 2012-11-07 DIAGNOSIS — Z9889 Other specified postprocedural states: Secondary | ICD-10-CM

## 2012-11-07 HISTORY — PX: LAPAROSCOPIC LYSIS OF ADHESIONS: SHX5905

## 2012-11-07 HISTORY — PX: LYSIS OF ADHESION: SHX5961

## 2012-11-07 HISTORY — PX: LAPAROTOMY: SHX154

## 2012-11-07 HISTORY — PX: LAPAROSCOPY: SHX197

## 2012-11-07 LAB — MAGNESIUM: Magnesium: 1.8 mg/dL (ref 1.5–2.5)

## 2012-11-07 LAB — BASIC METABOLIC PANEL
CO2: 33 mEq/L — ABNORMAL HIGH (ref 19–32)
Calcium: 9.1 mg/dL (ref 8.4–10.5)
Creatinine, Ser: 0.49 mg/dL — ABNORMAL LOW (ref 0.50–1.10)
GFR calc non Af Amer: >90 mL/min (ref >90)

## 2012-11-07 LAB — ABO/RH: ABO/RH(D): O NEG

## 2012-11-07 SURGERY — LAPAROSCOPY, DIAGNOSTIC
Anesthesia: General | Wound class: Clean

## 2012-11-07 MED ORDER — EPHEDRINE SULFATE 50 MG/ML IJ SOLN
INTRAMUSCULAR | Status: DC | PRN
  Administered 2012-11-07 (×2): 7.5 mg via INTRAVENOUS

## 2012-11-07 MED ORDER — LACTATED RINGERS IV SOLN
INTRAVENOUS | Status: DC | PRN
  Administered 2012-11-07 (×2): via INTRAVENOUS

## 2012-11-07 MED ORDER — BIOTENE DRY MOUTH MT LIQD
15.0000 mL | Freq: Four times a day (QID) | OROMUCOSAL | Status: DC
  Administered 2012-11-08 – 2012-11-29 (×65): 15 mL via OROMUCOSAL

## 2012-11-07 MED ORDER — LACTATED RINGERS IV SOLN
INTRAVENOUS | Status: DC
  Administered 2012-11-07: 1000 mL via INTRAVENOUS

## 2012-11-07 MED ORDER — HYDRALAZINE HCL 20 MG/ML IJ SOLN
INTRAMUSCULAR | Status: DC | PRN
  Administered 2012-11-07: 4 mg via INTRAVENOUS

## 2012-11-07 MED ORDER — CISATRACURIUM BESYLATE (PF) 10 MG/5ML IV SOLN
INTRAVENOUS | Status: DC | PRN
  Administered 2012-11-07: 12 mg via INTRAVENOUS
  Administered 2012-11-07 (×2): 4 mg via INTRAVENOUS
  Administered 2012-11-07: 6 mg via INTRAVENOUS

## 2012-11-07 MED ORDER — KCL IN DEXTROSE-NACL 20-5-0.45 MEQ/L-%-% IV SOLN
INTRAVENOUS | Status: DC
  Administered 2012-11-07: 100 mL/h via INTRAVENOUS
  Administered 2012-11-08 – 2012-11-10 (×5): via INTRAVENOUS
  Administered 2012-11-11: 50 mL/h via INTRAVENOUS
  Administered 2012-11-11: 10:00:00 via INTRAVENOUS
  Filled 2012-11-07 (×8): qty 1000

## 2012-11-07 MED ORDER — BUPIVACAINE-EPINEPHRINE 0.25% -1:200000 IJ SOLN
INTRAMUSCULAR | Status: DC | PRN
  Administered 2012-11-07: 5 mL

## 2012-11-07 MED ORDER — FENTANYL CITRATE 0.05 MG/ML IJ SOLN: 25.0000 ug | INTRAMUSCULAR | Status: DC | PRN

## 2012-11-07 MED ORDER — LACTATED RINGERS IV SOLN: INTRAVENOUS | Status: DC

## 2012-11-07 MED ORDER — SUCCINYLCHOLINE CHLORIDE 20 MG/ML IJ SOLN
INTRAMUSCULAR | Status: DC | PRN
  Administered 2012-11-07: 100 mg via INTRAVENOUS

## 2012-11-07 MED ORDER — MIDAZOLAM HCL 5 MG/ML IJ SOLN
INTRAMUSCULAR | Status: AC
  Administered 2012-11-07: 2 mg via INTRAVENOUS
  Filled 2012-11-07: qty 1

## 2012-11-07 MED ORDER — PHENYLEPHRINE HCL 10 MG/ML IJ SOLN
0.0000 ug/min | INTRAVENOUS | Status: DC
  Administered 2012-11-08: 40 ug/min via INTRAVENOUS
  Filled 2012-11-07: qty 2

## 2012-11-07 MED ORDER — HEPARIN SODIUM (PORCINE) 5000 UNIT/ML IJ SOLN
5000.0000 [IU] | Freq: Three times a day (TID) | INTRAMUSCULAR | Status: DC
  Administered 2012-11-08 – 2012-11-09 (×3): 5000 [IU] via SUBCUTANEOUS
  Filled 2012-11-07 (×6): qty 1

## 2012-11-07 MED ORDER — PHENYLEPHRINE HCL 10 MG/ML IJ SOLN
10.0000 mg | INTRAVENOUS | Status: DC | PRN
  Administered 2012-11-07: 25 ug/min via INTRAVENOUS

## 2012-11-07 MED ORDER — PHENYLEPHRINE HCL 10 MG/ML IJ SOLN
INTRAMUSCULAR | Status: DC | PRN
  Administered 2012-11-07 (×2): 80 ug via INTRAVENOUS

## 2012-11-07 MED ORDER — KETOROLAC TROMETHAMINE 30 MG/ML IJ SOLN: 15.0000 mg | Freq: Once | INTRAMUSCULAR | Status: DC | PRN

## 2012-11-07 MED ORDER — 0.9 % SODIUM CHLORIDE (POUR BTL) OPTIME
TOPICAL | Status: DC | PRN
  Administered 2012-11-07: 1000 mL

## 2012-11-07 MED ORDER — FENTANYL CITRATE 0.05 MG/ML IJ SOLN
INTRAMUSCULAR | Status: DC | PRN
  Administered 2012-11-07: 75 ug via INTRAVENOUS
  Administered 2012-11-07 (×2): 50 ug via INTRAVENOUS
  Administered 2012-11-07: 75 ug via INTRAVENOUS

## 2012-11-07 MED ORDER — SODIUM CHLORIDE 0.9 % IV BOLUS (SEPSIS)
500.0000 mL | Freq: Once | INTRAVENOUS | Status: AC
  Administered 2012-11-07: 500 mL via INTRAVENOUS

## 2012-11-07 MED ORDER — FENTANYL CITRATE 0.05 MG/ML IJ SOLN
50.0000 ug | INTRAMUSCULAR | Status: DC | PRN
  Administered 2012-11-07 (×3): 100 ug via INTRAVENOUS
  Administered 2012-11-08: 50 ug via INTRAVENOUS
  Administered 2012-11-08: 100 ug via INTRAVENOUS
  Administered 2012-11-08 (×2): 50 ug via INTRAVENOUS
  Administered 2012-11-08: 100 ug via INTRAVENOUS
  Administered 2012-11-08: 50 ug via INTRAVENOUS
  Filled 2012-11-07 (×8): qty 2

## 2012-11-07 MED ORDER — METOCLOPRAMIDE HCL 5 MG/ML IJ SOLN
10.0000 mg | Freq: Four times a day (QID) | INTRAMUSCULAR | Status: DC
  Administered 2012-11-07 – 2012-11-09 (×7): 10 mg via INTRAVENOUS
  Filled 2012-11-07 (×11): qty 2

## 2012-11-07 MED ORDER — MIDAZOLAM HCL 5 MG/ML IJ SOLN
INTRAMUSCULAR | Status: AC
  Administered 2012-11-07: 2 mg
  Filled 2012-11-07: qty 1

## 2012-11-07 MED ORDER — CHLORHEXIDINE GLUCONATE 0.12 % MT SOLN
15.0000 mL | Freq: Two times a day (BID) | OROMUCOSAL | Status: DC
  Administered 2012-11-07 – 2012-11-29 (×38): 15 mL via OROMUCOSAL
  Filled 2012-11-07 (×46): qty 15

## 2012-11-07 MED ORDER — PROPOFOL 10 MG/ML IV BOLUS
INTRAVENOUS | Status: DC | PRN
  Administered 2012-11-07: 100 mg via INTRAVENOUS

## 2012-11-07 MED ORDER — MIDAZOLAM HCL 5 MG/ML IJ SOLN
1.0000 mg | INTRAMUSCULAR | Status: DC | PRN
  Administered 2012-11-07: 1 mg via INTRAVENOUS
  Administered 2012-11-08: 2 mg via INTRAVENOUS
  Filled 2012-11-07 (×2): qty 1

## 2012-11-07 MED ORDER — SODIUM CHLORIDE 0.9 % IV SOLN
1.0000 g | INTRAVENOUS | Status: DC | PRN
  Administered 2012-11-07: 1 g via INTRAVENOUS

## 2012-11-07 MED ORDER — PROMETHAZINE HCL 25 MG/ML IJ SOLN: 6.2500 mg | INTRAMUSCULAR | Status: DC | PRN

## 2012-11-07 MED ORDER — MORPHINE SULFATE 2 MG/ML IJ SOLN: 1.0000 mg | INTRAMUSCULAR | Status: DC | PRN

## 2012-11-07 SURGICAL SUPPLY — 63 items
ADH SKN CLS APL DERMABOND .7 (GAUZE/BANDAGES/DRESSINGS)
APL SKNCLS STERI-STRIP NONHPOA (GAUZE/BANDAGES/DRESSINGS)
BENZOIN TINCTURE PRP APPL 2/3 (GAUZE/BANDAGES/DRESSINGS) IMPLANT
BLADE SURG ROTATE 9660 (MISCELLANEOUS) IMPLANT
CANISTER SUCTION 2500CC (MISCELLANEOUS) ×3 IMPLANT
CANNULA ENDOPATH XCEL 11M (ENDOMECHANICALS) IMPLANT
CHLORAPREP W/TINT 26ML (MISCELLANEOUS) ×3 IMPLANT
CLOTH BEACON ORANGE TIMEOUT ST (SAFETY) ×3 IMPLANT
COVER SURGICAL LIGHT HANDLE (MISCELLANEOUS) ×3 IMPLANT
DECANTER SPIKE VIAL GLASS SM (MISCELLANEOUS) IMPLANT
DERMABOND ADVANCED (GAUZE/BANDAGES/DRESSINGS)
DERMABOND ADVANCED .7 DNX12 (GAUZE/BANDAGES/DRESSINGS) IMPLANT
DRAPE LAPAROSCOPIC ABDOMINAL (DRAPES) ×3 IMPLANT
DRAPE WARM FLUID 44X44 (DRAPE) ×4 IMPLANT
DRSG TELFA 4X10 ISLAND STR (GAUZE/BANDAGES/DRESSINGS) ×1 IMPLANT
ELECT BLADE 6.5 EXT (BLADE) IMPLANT
ELECT BLADE TIP CTD 4 INCH (ELECTRODE) ×1 IMPLANT
ELECT REM PT RETURN 9FT ADLT (ELECTROSURGICAL) ×3
ELECTRODE REM PT RTRN 9FT ADLT (ELECTROSURGICAL) ×2 IMPLANT
GLOVE BIO SURGEON STRL SZ7.5 (GLOVE) ×6 IMPLANT
GLOVE BIOGEL PI IND STRL 7.0 (GLOVE) ×2 IMPLANT
GLOVE BIOGEL PI INDICATOR 7.0 (GLOVE) ×1
GOWN PREVENTION PLUS XLARGE (GOWN DISPOSABLE) ×3 IMPLANT
GOWN STRL NON-REIN LRG LVL3 (GOWN DISPOSABLE) ×9 IMPLANT
GOWN STRL REIN XL XLG (GOWN DISPOSABLE) ×3 IMPLANT
HAND ACTIVATED (MISCELLANEOUS) IMPLANT
KIT BASIN OR (CUSTOM PROCEDURE TRAY) ×3 IMPLANT
KIT ROOM TURNOVER OR (KITS) ×3 IMPLANT
LIGASURE IMPACT 36 18CM CVD LR (INSTRUMENTS) IMPLANT
NS IRRIG 1000ML POUR BTL (IV SOLUTION) ×6 IMPLANT
PACK GENERAL/GYN (CUSTOM PROCEDURE TRAY) ×3 IMPLANT
PAD ARMBOARD 7.5X6 YLW CONV (MISCELLANEOUS) ×6 IMPLANT
SCISSORS LAP 5X35 DISP (ENDOMECHANICALS) ×1 IMPLANT
SET IRRIG TUBING LAPAROSCOPIC (IRRIGATION / IRRIGATOR) IMPLANT
SOLUTION ANTI FOG 6CC (MISCELLANEOUS) ×3 IMPLANT
SPECIMEN JAR LARGE (MISCELLANEOUS) IMPLANT
SPONGE GAUZE 4X4 12PLY (GAUZE/BANDAGES/DRESSINGS) ×3 IMPLANT
SPONGE LAP 18X18 X RAY DECT (DISPOSABLE) ×3 IMPLANT
STAPLER VISISTAT 35W (STAPLE) ×4 IMPLANT
STRIP CLOSURE SKIN 1/2X4 (GAUZE/BANDAGES/DRESSINGS) IMPLANT
SUCTION POOLE TIP (SUCTIONS) ×3 IMPLANT
SUT PDS AB 1 CTX 36 (SUTURE) ×2 IMPLANT
SUT PDS AB 1 TP1 96 (SUTURE) ×6 IMPLANT
SUT SILK 1 TIES 10/18 (SUTURE) ×2 IMPLANT
SUT SILK 2 0 (SUTURE) ×3
SUT SILK 2 0 SH CR/8 (SUTURE) ×3 IMPLANT
SUT SILK 2-0 18XBRD TIE 12 (SUTURE) IMPLANT
SUT SILK 3 0 SH CR/8 (SUTURE) ×4 IMPLANT
SUT VIC AB 4-0 PS2 27 (SUTURE) IMPLANT
SYR BULB IRRIGATION 50ML (SYRINGE) ×1 IMPLANT
TOWEL OR 17X24 6PK STRL BLUE (TOWEL DISPOSABLE) ×3 IMPLANT
TOWEL OR 17X26 10 PK STRL BLUE (TOWEL DISPOSABLE) ×3 IMPLANT
TRAY FOLEY CATH 14FRSI W/METER (CATHETERS) ×1 IMPLANT
TRAY LAP CHOLE (CUSTOM PROCEDURE TRAY) ×3 IMPLANT
TROCAR BLADELESS OPT 5 75 (ENDOMECHANICALS) ×1 IMPLANT
TROCAR SLEEVE XCEL 5X75 (ENDOMECHANICALS) ×1 IMPLANT
TROCAR XCEL BLUNT TIP 100MML (ENDOMECHANICALS) ×2 IMPLANT
TROCAR XCEL NON-BLD 11X100MML (ENDOMECHANICALS) IMPLANT
TUBING INSUFFLATION 10FT LAP (TUBING) ×3 IMPLANT
WATER STERILE IRR 1000ML POUR (IV SOLUTION) IMPLANT
WATER STERILE IRR 1500ML POUR (IV SOLUTION) ×3 IMPLANT
YANKAUER SUCT BULB TIP 10FT TU (MISCELLANEOUS) ×1 IMPLANT
YANKAUER SUCT BULB TIP NO VENT (SUCTIONS) ×1 IMPLANT

## 2012-11-07 NOTE — Progress Notes (Signed)
Vent changes made per MD order. 

## 2012-11-07 NOTE — Progress Notes (Signed)
[facility] PROGRESS NOTE  [name] ZOX:[id number] DOB: 01-Oct-1941 DOA: 10/29/2012 PCP: Mickie Hillier, MD  Assessment/Plan: 1. Acute on chronic respiratory failure: COPD EXACERBATION -Improved and at baseline now. -following pulmonary rec's; will start on triple nebulizer medications (pulmicort, atrovent and albuterol) -stop steroids -continue oxygen supplementation. -Pulmonary service coming on board to help with ventilatory needs and support.  2. Anxiety: continue with IV ativan as needed.   3. 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 Resolved and stable now. Will monitor.    4. Anemia: normocytic. Anemia panel shows ferritin of 115, low iron levels. She had a normal colonoscopy 8 years ago. Once patient able to take PO will start iron supplementation. Hgb stable and no needs for transfusion at this point.  5. Abdominal distention: x-ray demonstrating SBO unchanged. NGT with blachish/fecal appereance content on suction. No BM and no BS on physical exam. MG, PO4 and potassium WNL. Plan is for surgery today.  6-HTN: stable overall. Continue PRN IV hydralazine while NPO.  DVT prophylaxis: SCD's  Antibiotics:  levaquin  From 12 /24 >>> 12/31  HPI/Subjective: Patient w/o BM's or flatus. She is afebrile and after discussing with Dr. Derrell Lolling in agreement with abd surgery.    Objective: Filed Vitals:   11/06/12 2048 11/06/12 2118 11/07/12 0447 11/07/12 0919  BP:  137/73 126/70   Pulse:  87 76   Temp:  98.3 F (36.8 C) 98.3 F (36.8 C)   TempSrc:  Oral Oral   Resp:  18 18   Height:      Weight:      SpO2: 98% 99% 100% 95%    Intake/Output Summary (Last 24 hours) at 11/07/12 1029 Last data filed at 11/07/12 0758  Gross per 24 hour  Intake   1225 ml  Output    775 ml  Net    450 ml   Filed Weights   10/29/12 1410  Weight: 43 kg (94 lb 12.8 oz)    Exam:   General:  Alert, afebrile, anxious and  w/o BM or flatus  Cardiovascular: s1s2, no rubs or gallops  Respiratory: no wheezing, no worsening SOB, no crackles; fair air movement  Abdomen: soft, NT, mild distension, no BS;   Neuro: non focal.  Data Reviewed: Basic Metabolic Panel:  Lab 11/07/12 8119 11/06/12 0505 11/05/12 0500 11/04/12 0445 11/03/12 0521  NA 137 134* 135 135 132*  K 3.6 3.5 4.2 4.7 4.9  CL 99 97 98 97 96  CO2 33* 31 33* 31 30  GLUCOSE 101* 115* 101* 57* 93  BUN 8 11 14 19 20   CREATININE 0.49* 0.50 0.57 0.68 0.69  CALCIUM 9.1 9.0 9.1 9.4 8.7  MG 1.8 -- -- -- --  PHOS -- 3.2 -- -- --   CBC:  Lab 11/06/12 0505 11/03/12 0521 10/31/12 1300  WBC 9.9 13.9* 12.5*  NEUTROABS -- -- --  HGB 10.1* 11.8* 11.1*  HCT 30.3* 35.4* 32.1*  MCV 95.6 94.7 92.0  PLT 216 209 226   BNP (last 3 results)  Basename 10/29/12 0935  PROBNP 352.9*   CBG:  Lab 11/05/12 0757 11/05/12 0400 11/05/12 0103  GLUCAP 92 104* 121*    Studies: Dg Abd 1 View  11/06/2012  *RADIOLOGY REPORT*  Clinical Data: The NG tube placement  ABDOMEN - 1 VIEW  Comparison: Earlier the same day  Findings: 1115 hours.  NG tube has been advanced in the interval. The tip overlies the proximal stomach with  proximal port of the NG tube now positioned at the esophagogastric junction.  The diffuse gaseous small bowel dilatation persists.  IMPRESSION: NG tube tip is now in the proximal stomach with the side port of the NG tube at the EG junction.  Persistent diffuse gaseous small bowel dilatation consistent with obstruction.   Original Report Authenticated By: Kennith Center, M.D.    Ct Abdomen Pelvis W Contrast  11/05/2012  **ADDENDUM** CREATED: 11/05/2012 14:52:43  The transition point of the small bowel obstruction is in the region of the mid-distal ileum rather than the distal ileum as initially dictated.  **END ADDENDUM** SIGNED BY: Almedia Balls. Constance Goltz, M.D.   11/05/2012  *RADIOLOGY REPORT*  Clinical Data: Lower abdominal pain.  Bloating for 3-4 years.  Ovarian cancer diagnosed 1989.  Post total hysterectomy.  Question rectal mass.  CT ABDOMEN AND PELVIS WITH CONTRAST  Technique:  Multidetector CT imaging of the abdomen and pelvis was performed following the standard protocol during bolus administration of intravenous contrast.  Contrast: OMNIPAQUE IOHEXOL 300 MG/ML  SOLN  Comparison: 11/05/2012 plain film examination.  01/04/2004 CT.  Findings: Gas and fluid filled dilated small bowel loops to the level of the distal ileum.  There is an abrupt change caliber of the distal ileum in the pelvis without mass identified.  This may be related to adhesions in this patient who has had a prior hysterectomy.  Fluid in the pelvis and surrounding the liver without nodularity as may be expected if this were related to the recurrent ovarian tumor.  This fluid is therefore may be related to the bowel obstruction.  Presently, no evidence of pneumatosis or free intraperitoneal air.  No evidence of rectal mass.  Decompressed colon.  The ligament of Treitz is located  midline.  This may be related to ligamentum laxity rather than malrotation.  Separate origin of the hepatic artery and splenic artery from the aorta.  Atherosclerotic type changes of the aorta with ectasia. Atherosclerotic type changes iliac arteries and femoral arteries.  Pleural effusion and basilar atelectasis greater on the right.  Tiny low density structure within the left lobe liver and right kidney may be cysts although too small to adequately characterize. No worrisome hepatic, splenic, pancreatic, adrenal or renal lesion.  Mild enhancement of the fundus of the gallbladder.  No calcified gallstone.  No bony destructive lesion.  No adenopathy.  Small hiatal hernia.  IMPRESSION: Small bowel obstruction with dilated small bowel to the level of the distal ileum in the region of the pelvis where there is abrupt change of caliber which may be related to adhesions.  Please see above discussion.  This has been  made a PRA call report utilizing dashboard call feature.   Original Report Authenticated By: Lacy Duverney, M.D.    Dg Abd 2 Views  11/06/2012  *RADIOLOGY REPORT*  Clinical Data: Abdominal distention.  ABDOMEN - 2 VIEW  Comparison: 11/05/2012  Findings: Upright film shows no evidence for intraperitoneal free air.  There is marked diffuse small bowel dilatation measuring up to 5.0 cm in diameter.  Although the distribution of bowel loops is slightly different in the interval, there is been no substantial improvement or progression since the prior study.  Of note, the NG tube tip is in the distal esophagus.  IMPRESSION: Persistent diffuse gaseous small bowel dilatation with air fluid levels, consistent with obstruction.  No substantial interval change.  NG tube tip is in the distal esophagus.  I personally called these results to the patient's nurse,  Toniann Fail, at 1005 hours on 10/06/2013.   Original Report Authenticated By: Kennith Center, M.D.     Scheduled Meds:    . [facility] HOLD] albuterol  2.5 mg Nebulization Q6H  . [MAR HOLD] budesonide  0.25 mg Nebulization Q6H  . [MAR HOLD] ipratropium  0.5 mg Nebulization Q6H  . [MAR HOLD] pantoprazole (PROTONIX) IV  40 mg Intravenous Q24H  . [facility] HOLD] polyethylene glycol  17 g Oral Daily   Continuous Infusions:    . lactated ringers 1,000 mL (11/07/12 1008)    Time: < 30 minutes    Lateka Rady  [facility] Pager [phone]. If 8PM-8AM, please contact night-coverage at [email or web address], password [facility] 11/07/2012, 10:29 AM  LOS: 9 days

## 2012-11-07 NOTE — Progress Notes (Signed)
FiO2 decreased per Spo2

## 2012-11-07 NOTE — Progress Notes (Signed)
Pt profile: 71 yowf admitted by [facility] 12/24 with dx of aecopd and treated accordingly with improvement in respiratory symptoms. She developed progressive abdominal distention with KUB and CT abd c/w SBO. Underwent exploratory laparotomy, lysis of adhesions 1/02. Returned to ICU on vent. PCCM asked to assist with vent/ICU mgmt   Lines, Tubes, etc: ETT 1/02 >>  R IJ CVL 1/02 >>   Microbiology: MRSA PCR 1/02 >> NEG  Antibiotics:  none   Studies/Events: 12/31 CT abd: Small bowel obstruction with dilated small bowel to the level of the distal ileum in the region of the pelvis where there is abrupt change of caliber which may be related to adhesions   Consults:  CCS 12/30   Best Practice: DVT: SQ hep SUP: PPI Nutrition: none Glycemic control: n/i Sedation/analgesia: intermittent sedation protocol   [name]: + F/C. Not weanable presently. Post op hypothermia  [name]: Filed Vitals:   11/07/12 1430  BP: 136/90  Pulse: 113  Temp:   Resp: 13    Gen: frail, NAD HEENT: WNL Neck: no JVD Chest: prolonged exp phase, no wheezes Cardiac: RRR s M Abd: mildly to moderately distended, no BS, diffusely tender Ext: cool, no edema Neuro: RASS -1, + F/C, MAEs  BMET    Component Value Date/Time   NA 137 11/07/2012 0433   K 3.6 11/07/2012 0433   CL 99 11/07/2012 0433   CO2 33* 11/07/2012 0433   GLUCOSE 101* 11/07/2012 0433   BUN 8 11/07/2012 0433   CREATININE 0.49* 11/07/2012 0433   CALCIUM 9.1 11/07/2012 0433   GFRNONAA >90 11/07/2012 0433   GFRAA >90 11/07/2012 0433    CBC    Component Value Date/Time   WBC 9.9 11/06/2012 0505   RBC 3.17* 11/06/2012 0505   HGB 10.1* 11/06/2012 0505   HCT 30.3* 11/06/2012 0505   PLT 216 11/06/2012 0505   MCV 95.6 11/06/2012 0505   MCH 31.9 11/06/2012 0505   MCHC 33.3 11/06/2012 0505   RDW 12.9 11/06/2012 0505   LYMPHSABS 0.7 08/15/2008 0550   MONOABS 0.2 08/15/2008 0550   EOSABS 0.0 08/15/2008 0550   BASOSABS 0.0 08/15/2008 0550    CXR: ETT, CVL OK,  NAD   IMPRESSION:  *Respiratory failure, post-operative  SBO (small bowel obstruction)  Status post laparotomy  Emphysema, severe  Anemia, mild  No evidence of acute blood loss. No indication for PRBCs  H/O Anxiety  History of ovarian cancer, s/p remote resection  H/O: hypertension  H/O hyperlipidemia   PLAN/RECS:  Vent settings established Intermittent sedation/analgesia protocol ordered DVTp and SUP ordered IVFs ordered Cont nebulized steroids and BDs Daily WUA and SBT beginning 1/03 AM Resume outpt meds when feasible and appropriate   CCM 30 mins   Billy Fischer, MD ; [facility] [phone].  After 5:30 PM or weekends, call [phone]

## 2012-11-07 NOTE — Op Note (Signed)
Pre Operative Diagnosis:  Small bowel obstruction  Post Operative Diagnosis: same  Procedure:  Diagnostic laparoscopy, exploratory laparotomy, lysis of adhesions  Surgeon: Dr. Axel Filler  Assistant: Barnetta Chapel, PA  Anesthesia: GETA  EBL: 25 cc   Complications: the patient small serosal tear in her distal ileum which was repaired with a 3-0 vicryl.    Counts: reported as correct x 2  Findings:  The patient had adhesions or distal bowel down to the pelvis into the posterior surface of the bladder's peritoneum. Patient small serosal tear while lysing adhesions which was repaired with 3-0 Vicryl x1.  Indications for procedure:  The patient is a 72 year old female with small bowel obstruction, with previous hysterectomy. Patient underwent conservative management which was unsuccessful I was asked to the operating room for relief of her small bowel function.  Details of the procedure:The patient was taken back to the operating room. The patient was placed in supine position with bilateral SCDs in place. After appropriate anitbiotics were confirmed, a time-out was confirmed and all facts were verified.  Pneumoperitoneum of 14 mm mercury via a Veress needle technique in the left upper quadrant.  This was followed by a 5 mm trocar. A 5 millimeter camera was then placed intra-abdominally and was noted to be a large amount of dilated small bowel that was seen. The chest wall and adhesions to the midline her umbilicus. A second right upper quadrant by quadrant port was then placed under direct visualization. The adhesions were taken down laparoscopically. At this time secondary to her dilated bowel with converted to an open exploratory laparotomy. Incision was made midline dissection was carried out using Bovie cautery to maintain hemostasis.  The fascia was incised and peritoneum was incised the abdomen was entered. It was noted to be large amount of dilated bowel. The patient was eviscerated and  adhesions down to the pelvis were palpated.  At this time we proceeded to lyse adhesions which were to the posterior peritoneum bladder. Care is taken not to incise the bladder. There was one small serosal cut in the distal ileum which was repaired with a 3-0 silk x1.  The rest of the bowel was lysed adhesions that were attached to the sigmoid colon. Although adhesions were freed the bowel was brought into the incision. The bowel was run from ligament of Treitz to the terminal ileum.  The small amount of interloop adhesions were lysed. There were no further serosal tears.  At this time we irrigated out packed with sterile saline. The fascia was reapproximated using a #1 PDS in running standard fashion. The skin trocar sites were then stapled. The patient was then transported to the recovery room in stable condition.

## 2012-11-07 NOTE — Progress Notes (Signed)
Notified Dr. Marin Shutter  of LFT lab results.

## 2012-11-07 NOTE — Progress Notes (Signed)
PT Cancellation Note  Patient Details Name: [name] MRN: [id number] DOB: [birth year]   Cancelled Treatment:    Reason Eval/Treat Not Completed: Patient at procedure or test/unavailable.  Pt went for Diagnostic laparoscopy, exploratory laparotomy, lysis of adhesions today.  Will check back with pt tomorrow.    Thanks,   Page, Meribeth Mattes 11/07/2012, 2:01 PM

## 2012-11-07 NOTE — Anesthesia Preprocedure Evaluation (Addendum)
Anesthesia Evaluation  Patient identified by MRN, date of birth, ID band Patient awake    Reviewed: Allergy & Precautions, H&P , NPO status , Patient's Chart, lab work & pertinent test results  Airway Mallampati: II TM Distance: <3 FB Neck ROM: Full    Dental No notable dental hx.    Pulmonary shortness of breath, COPD oxygen dependent, former smoker,  Mid 80's room air sat   + decreased breath sounds      Cardiovascular hypertension, Pt. on medications Rhythm:Regular Rate:Normal     Neuro/Psych negative neurological ROS  negative psych ROS   GI/Hepatic negative GI ROS, Neg liver ROS,   Endo/Other  negative endocrine ROS  Renal/GU negative Renal ROS  negative genitourinary   Musculoskeletal negative musculoskeletal ROS (+)   Abdominal   Peds negative pediatric ROS (+)  Hematology negative hematology ROS (+)   Anesthesia Other Findings   Reproductive/Obstetrics negative OB ROS                           Anesthesia Physical Anesthesia Plan  ASA: IV  Anesthesia Plan: General   Post-op Pain Management:    Induction: Intravenous, Rapid sequence and Cricoid pressure planned  Airway Management Planned: Oral ETT  Additional Equipment: CVP  Intra-op Plan:   Post-operative Plan: Possible Post-op intubation/ventilation  Informed Consent: I have reviewed the patients History and Physical, chart, labs and discussed the procedure including the risks, benefits and alternatives for the proposed anesthesia with the patient or authorized representative who has indicated his/her understanding and acceptance.   Dental advisory given  Plan Discussed with: CRNA and Surgeon  Anesthesia Plan Comments:        Anesthesia Quick Evaluation

## 2012-11-07 NOTE — Transfer of Care (Signed)
Immediate Anesthesia Transfer of Care Note  Patient: [name]  Procedure(s) Performed: Procedure(s) (LRB) with comments: LAPAROSCOPY DIAGNOSTIC (N/A) EXPLORATORY LAPAROTOMY (N/A) LAPAROSCOPIC LYSIS OF ADHESIONS (N/A) LYSIS OF ADHESION ()  Patient Location: PACU and ICU  Anesthesia Type:General  Level of Consciousness: unresponsive  Airway & Oxygen Therapy: Patient remains intubated per anesthesia plan  Post-op Assessment: Post -op Vital signs reviewed and stable  Post vital signs: Reviewed and stable  Complications: No apparent anesthesia complications

## 2012-11-07 NOTE — Progress Notes (Signed)
General surgery attending courtesy note:  I have known [name] in the past. She was treated as an inpatient for diverticulitis in approximately 2008  but never required surgery. She had a subsequent colonoscopy about 8 years ago. She was admitted recently for exacerbation of her COPD and then developed abdominal distention and a clinical and radiographic findings consistent with a distal adhesive small bowel obstruction. She denies prior episodes of SBO.  This has not resolved and she remains distended and a little bit uncomfortable. She is not passing flatus or stool. X-rays yesterday show a high-grade SBO.  She requested that I speak with her about this.  On exam, she is alert and in minimal distress. Mental status is normal. She is oriented and cooperative. Abdomen is quite distended, tympanitic throughout. Slightly uncomfortable but no peritoneal signs. Lower midline scar. No hernias.  Assessment: High-grade distal small bowel obstruction, presumably secondary to adhesions. This has not resolved with adequate medical therapy, and I think that she would  benefit from  surgical intervention at this time.  History of diverticulitis, treated with nonoperative therapy  History of abdominal hysterectomy.  Acute on chronic respiratory failure with COPD exacerbation, improving on IV steroids and inhalation therapy. This certainly will increase her risk of surgical intervention, but is not prohibitive.   Plan: I will discuss my opinion With Dr. Derrell Lolling, our surgical hospitalist at [facility] this week. I have taken the liberty of discontinuing her Lovenox.   Angelia Mould. Derrell Lolling, M.D., [facility] General and Minimally invasive Surgery Breast and Colorectal Surgery Office:   [phone] Pager:   [phone]

## 2012-11-07 NOTE — Progress Notes (Signed)
OT Cancellation Note  Patient Details Name: [name] MRN: [id number] DOB: 07/26/1941   Cancelled Treatment:    Reason Eval/Treat Not Completed: Patient at procedure or test/ unavailable. Per nursing, pt for surgery today.   Lennox Laity [phone] 11/07/2012, 9:07 AM

## 2012-11-08 ENCOUNTER — Encounter (HOSPITAL_COMMUNITY): Payer: Self-pay | Admitting: General Surgery

## 2012-11-08 ENCOUNTER — Inpatient Hospital Stay (HOSPITAL_COMMUNITY): Payer: Medicare Other

## 2012-11-08 LAB — BASIC METABOLIC PANEL
CO2: 26 mEq/L (ref 19–32)
Calcium: 8 mg/dL — ABNORMAL LOW (ref 8.4–10.5)
Creatinine, Ser: 0.69 mg/dL (ref 0.50–1.10)
Glucose, Bld: 238 mg/dL — ABNORMAL HIGH (ref 70–99)

## 2012-11-08 LAB — LACTIC ACID, PLASMA: Lactic Acid, Venous: 1.6 mmol/L (ref 0.5–2.2)

## 2012-11-08 LAB — BLOOD GAS, ARTERIAL
Bicarbonate: 25.5 mEq/L — ABNORMAL HIGH (ref 20.0–24.0)
Patient temperature: 98.6
TCO2: 23.3 mmol/L (ref 0–100)
pH, Arterial: 7.398 (ref 7.350–7.450)

## 2012-11-08 LAB — GLUCOSE, CAPILLARY: Glucose-Capillary: 132 mg/dL — ABNORMAL HIGH (ref 70–99)

## 2012-11-08 LAB — CBC
Hemoglobin: 11.1 g/dL — ABNORMAL LOW (ref 12.0–15.0)
MCH: 31.7 pg (ref 26.0–34.0)
MCV: 95.1 fL (ref 78.0–100.0)
RBC: 3.5 MIL/uL — ABNORMAL LOW (ref 3.87–5.11)

## 2012-11-08 LAB — PROCALCITONIN: Procalcitonin: 2.86 ng/mL

## 2012-11-08 MED ORDER — INSULIN ASPART 100 UNIT/ML ~~LOC~~ SOLN
1.0000 [IU] | SUBCUTANEOUS | Status: DC
  Administered 2012-11-08 (×2): 2 [IU] via SUBCUTANEOUS
  Administered 2012-11-08 – 2012-11-09 (×2): 1 [IU] via SUBCUTANEOUS
  Administered 2012-11-09: 2 [IU] via SUBCUTANEOUS
  Administered 2012-11-09 – 2012-11-10 (×2): 1 [IU] via SUBCUTANEOUS

## 2012-11-08 MED ORDER — BUDESONIDE 0.25 MG/2ML IN SUSP
0.2500 mg | Freq: Two times a day (BID) | RESPIRATORY_TRACT | Status: DC
  Administered 2012-11-08 – 2012-11-11 (×6): 0.25 mg via RESPIRATORY_TRACT
  Filled 2012-11-08 (×8): qty 2

## 2012-11-08 MED ORDER — VANCOMYCIN HCL IN DEXTROSE 1-5 GM/200ML-% IV SOLN
1000.0000 mg | Freq: Once | INTRAVENOUS | Status: AC
  Administered 2012-11-08: 1000 mg via INTRAVENOUS
  Filled 2012-11-08: qty 200

## 2012-11-08 MED ORDER — PIPERACILLIN-TAZOBACTAM 3.375 G IVPB
3.3750 g | Freq: Three times a day (TID) | INTRAVENOUS | Status: AC
  Administered 2012-11-08 – 2012-11-15 (×21): 3.375 g via INTRAVENOUS
  Filled 2012-11-08 (×21): qty 50

## 2012-11-08 MED ORDER — FENTANYL CITRATE 0.05 MG/ML IJ SOLN
50.0000 ug | INTRAMUSCULAR | Status: DC | PRN
  Administered 2012-11-08 – 2012-11-09 (×5): 50 ug via INTRAVENOUS
  Filled 2012-11-08 (×4): qty 2

## 2012-11-08 MED ORDER — LIDOCAINE HCL (PF) 1 % IJ SOLN
INTRAMUSCULAR | Status: AC
  Administered 2012-11-08: 01:00:00
  Filled 2012-11-08: qty 5

## 2012-11-08 MED ORDER — SODIUM CHLORIDE 0.9 % IV BOLUS (SEPSIS)
500.0000 mL | Freq: Once | INTRAVENOUS | Status: AC
  Administered 2012-11-08: 500 mL via INTRAVENOUS

## 2012-11-08 MED ORDER — VANCOMYCIN HCL 1000 MG IV SOLR
750.0000 mg | Freq: Two times a day (BID) | INTRAVENOUS | Status: DC
  Administered 2012-11-09: 750 mg via INTRAVENOUS
  Filled 2012-11-08 (×2): qty 750

## 2012-11-08 NOTE — Progress Notes (Signed)
ANTIBIOTIC CONSULT NOTE - INITIAL  Pharmacy Consult for Vanc, Zosyn Indication: r/o sepsis  Allergies  Allergen Reactions  . Oxycodone-Acetaminophen     REACTION: headaches,nausea    Patient Measurements: Height: 5\' 5"  (165.1 cm) Weight: 129 lb 10.1 oz (58.8 kg) IBW/kg (Calculated) : 57   Vital Signs: Temp: 99.2 F (37.3 C) (01/03 0800) Temp src: Oral (01/03 0800) BP: 93/68 mmHg (01/03 0824) Pulse Rate: 105  (01/03 0800) Intake/Output from previous day: 01/02 0701 - 01/03 0700 In: 4342.3 [I.V.:4174.3; IV Piggyback:18] Out: 1930 [Urine:900; Emesis/NG output:80; Blood:150] Intake/Output from this shift: Total I/O In: 226.3 [I.V.:226.3] Out: 40 [Urine:40]  Labs:  [facility] 11/08/12 0415 11/07/12 0433 11/06/12 0505  WBC 21.1* -- 9.9  HGB 11.1* -- 10.1*  PLT 269 -- 216  LABCREA -- -- --  CREATININE 0.69 0.49* 0.50   Estimated Creatinine Clearance: 58 ml/min (by C-G formula based on Cr of 0.69). No results found for this basename: VANCOTROUGH:2,VANCOPEAK:2,VANCORANDOM:2,GENTTROUGH:2,GENTPEAK:2,GENTRANDOM:2,TOBRATROUGH:2,TOBRAPEAK:2,TOBRARND:2,AMIKACINPEAK:2,AMIKACINTROU:2,AMIKACIN:2, in the last 72 hours   Assessment:  47 yof admitted 12/24, treated for AECOPD.  Pt developed SBO - underwent ex lap 1/2 and returned to the ICU on vent.  CCM on board, adding Vanc and Zosyn empirically to r/o sepsis.  Plan extubation today.  Afebrile, WBC 21.1K, Scr wnl with CrCl 58 ml/min, normalized 73 ml/min.  Pan-culture.   Goal of Therapy:  Vancomycin trough level 15-20 mcg/ml  Plan:   Vancomycin 1gm now then 750 mg IV q12h  Zosyn 3.375 gm IV q8h over 4 hours  Pharmacy will f/u  Geoffry Paradise, PharmD, BCPS Pager: [phone] 10:21 AM Pharmacy #: [id number]

## 2012-11-08 NOTE — Progress Notes (Signed)
1 Day Post-Op  Subjective: Pt with no acute changes overnight.  Pt con't on Neo and on Vent support.    Objective: Vital signs in last 24 hours: Temp:  [95.2 F (35.1 C)-99 F (37.2 C)] 97.6 F (36.4 C) (01/03 0400) Pulse Rate:  [93-124] 102  (01/03 0715) Resp:  [9-22] 14  (01/03 0715) BP: (65-153)/(46-100) 104/75 mmHg (01/03 0600) SpO2:  [95 %-100 %] 99 % (01/03 0715) FiO2 (%):  [30 %-50 %] 30 % (01/03 0412) Weight:  [129 lb 10.1 oz (58.8 kg)] 129 lb 10.1 oz (58.8 kg) (01/02 1335) Last BM Date: 12/02/12  Intake/Output from previous day: 01/02 0701 - 01/03 0700 In: 4342.3 [I.V.:4174.3; IV Piggyback:18] Out: 1930 [Urine:900; Emesis/NG output:80; Blood:150] Intake/Output this shift:    General appearance: alert and cooperative GI: s/approp ttp/nd/decreased BS  Lab Results:   [facility] 11/08/12 0415 11/06/12 0505  WBC 21.1* 9.9  HGB 11.1* 10.1*  HCT 33.3* 30.3*  PLT 269 216   BMET  Basename 11/08/12 0415 11/07/12 0433  NA 131* 137  K 4.4 3.6  CL 99 99  CO2 26 33*  GLUCOSE 238* 101*  BUN 12 8  CREATININE 0.69 0.49*  CALCIUM 8.0* 9.1   PT/INR No results found for this basename: LABPROT:2,INR:2 in the last 72 hours ABG  Basename 11/08/12 0353  PHART 7.398  HCO3 25.5*    Studies/Results: Dg Chest 1 View  11/07/2012  *RADIOLOGY REPORT*  Clinical Data: Respiratory failure.  CHEST - 1 VIEW  Comparison: 10/29/2012.  Findings: ET tube has been inserted and lies with its tip 6.2 cm above carina.  Central venous catheter tip from right IJ approach lies with its tip at the distal SVC.  There is no pneumothorax. Nasogastric tube tip in stomach.  No infiltrates or significant atelectasis.  IMPRESSION: ET tube, central venous catheter, nasogastric tube all good position.  No pneumothorax.   Original Report Authenticated By: Davonna Belling, M.D.    Dg Abd 1 View  11/06/2012  *RADIOLOGY REPORT*  Clinical Data: The NG tube placement  ABDOMEN - 1 VIEW  Comparison: Earlier the same  day  Findings: 1115 hours.  NG tube has been advanced in the interval. The tip overlies the proximal stomach with proximal port of the NG tube now positioned at the esophagogastric junction.  The diffuse gaseous small bowel dilatation persists.  IMPRESSION: NG tube tip is now in the proximal stomach with the side port of the NG tube at the EG junction.  Persistent diffuse gaseous small bowel dilatation consistent with obstruction.   Original Report Authenticated By: Kennith Center, M.D.    Dg Chest Port 1 View  11/08/2012  *RADIOLOGY REPORT*  Clinical Data: Endotracheal tube placement.  PORTABLE CHEST - 1 VIEW  Comparison: Chest radiograph performed 11/07/2012  Findings: The patient's endotracheal tube is seen ending 4-5 cm above the carina.  An enteric tube is noted extending below the diaphragm.  A right IJ line is noted ending about the mid SVC.  Lucency at the upper lung zones suggests underlying emphysema.  The lungs are otherwise grossly clear.  No focal consolidation, pleural effusion or pneumothorax is seen.  The cardiomediastinal silhouette is normal in size; calcification is noted within the aortic arch.  No acute osseous abnormalities are identified.  IMPRESSION:  1.  Endotracheal tube seen ending 4-5 cm above the carina. 2.  Underlying emphysema noted; lungs otherwise grossly clear.   Original Report Authenticated By: Tonia Ghent, M.D.    Dg Abd 2  Views  11/06/2012  *RADIOLOGY REPORT*  Clinical Data: Abdominal distention.  ABDOMEN - 2 VIEW  Comparison: 11/05/2012  Findings: Upright film shows no evidence for intraperitoneal free air.  There is marked diffuse small bowel dilatation measuring up to 5.0 cm in diameter.  Although the distribution of bowel loops is slightly different in the interval, there is been no substantial improvement or progression since the prior study.  Of note, the NG tube tip is in the distal esophagus.  IMPRESSION: Persistent diffuse gaseous small bowel dilatation with air  fluid levels, consistent with obstruction.  No substantial interval change.  NG tube tip is in the distal esophagus.  I personally called these results to the patient's nurse, Toniann Fail, at 1005 hours on 10/06/2013.   Original Report Authenticated By: Kennith Center, M.D.     Anti-infectives: Anti-infectives     Start     Dose/Rate Route Frequency Ordered Stop   11/04/12 1200   levofloxacin (LEVAQUIN) IVPB 500 mg  Status:  Discontinued        500 mg 100 mL/hr over 60 Minutes Intravenous Daily 11/04/12 1124 11/05/12 1436   11/01/12 1615   fluconazole (DIFLUCAN) tablet 200 mg        200 mg Oral  Once 11/01/12 1607 11/01/12 1636   11/01/12 1000   levofloxacin (LEVAQUIN) IVPB 750 mg  Status:  Discontinued        750 mg 100 mL/hr over 90 Minutes Intravenous Every 48 hours 10/30/12 1108 10/31/12 1428   11/01/12 1000   levofloxacin (LEVAQUIN) tablet 750 mg  Status:  Discontinued        750 mg Oral Every 48 hours 10/31/12 1428 11/04/12 1124   10/29/12 1045   levofloxacin (LEVAQUIN) IVPB 750 mg  Status:  Discontinued        750 mg 100 mL/hr over 90 Minutes Intravenous Every 24 hours 10/29/12 1034 10/30/12 1108          Assessment/Plan: s/p Procedure(s) (LRB) with comments: LAPAROSCOPY DIAGNOSTIC (N/A) EXPLORATORY LAPAROTOMY (N/A) LAPAROSCOPIC LYSIS OF ADHESIONS (N/A) LYSIS OF ADHESION () -Con't vent support and wean; appreciate CCM help -wean pressor as tol -con't NGT -con't foley to monitor UOP -await bowel function -May need TPN in next few days if con't with intubation and NPO    LOS: 10 days    Marigene Ehlers., [facility] 11/08/2012

## 2012-11-08 NOTE — Progress Notes (Signed)
Pt profile: 71 yowf admitted by [facility] 12/24 with dx of aecopd and treated accordingly with improvement in respiratory symptoms. She developed progressive abdominal distention with KUB and CT abd c/w SBO. Underwent exploratory laparotomy, lysis of adhesions 1/02. Returned to ICU on vent. PCCM asked to assist with vent/ICU mgmt   Lines, Tubes, etc: ETT 1/02 >>  R IJ CVL 1/02 >>   Microbiology: MRSA PCR 1/02 >> NEG 1/3 BC x 2 >> 1/3 UC >>  Antibiotics:  vanco (empiric, pressors) 1/3 >> Zosyn (empiric, pressors) 1/3 >>    Studies/Events: 12/31 CT abd: Small bowel obstruction with dilated small bowel to the level of the distal ileum in the region of the pelvis where there is abrupt change of caliber which may be related to adhesions 1/2 Hypotension post op >pressor support  1/3 low grade fever/elevated WBC >pan cx   Consults:  [facility] 12/30   Best Practice: DVT: SQ hep SUP: PPI Nutrition: none Glycemic control: n/i Sedation/analgesia: intermittent sedation protocol   [name]: + F/C. Very alert , writing notes-"wants tube out now"  Decreased UOP overnight, phenylephrine not weaned for this reason B/p drops w/ sedation/pain meds.    [name]: Filed Vitals:   11/08/12 0824  BP: 93/68  Pulse:   Temp:   Resp:     Gen: frail, NAD HEENT: ETT in place  Neck: no JVD Chest: prolonged exp phase, no wheezes Cardiac: RRR s M Abd: hypoactive, dressing dry /clean  Ext: cool, no edema Neuro: a/o x 3 , + F/C, MAEs  BMET    Component Value Date/Time   NA 131* 11/08/2012 0415   K 4.4 11/08/2012 0415   CL 99 11/08/2012 0415   CO2 26 11/08/2012 0415   GLUCOSE 238* 11/08/2012 0415   BUN 12 11/08/2012 0415   CREATININE 0.69 11/08/2012 0415   CALCIUM 8.0* 11/08/2012 0415   GFRNONAA 86* 11/08/2012 0415   GFRAA >90 11/08/2012 0415    CBC    Component Value Date/Time   WBC 21.1* 11/08/2012 0415   RBC 3.50* 11/08/2012 0415   HGB 11.1* 11/08/2012 0415   HCT 33.3* 11/08/2012 0415   PLT 269 11/08/2012 0415   MCV  95.1 11/08/2012 0415   MCH 31.7 11/08/2012 0415   MCHC 33.3 11/08/2012 0415   RDW 13.1 11/08/2012 0415   LYMPHSABS 0.7 08/15/2008 0550   MONOABS 0.2 08/15/2008 0550   EOSABS 0.0 08/15/2008 0550   BASOSABS 0.0 08/15/2008 0550    CXR: ETT, CVL OK, NAD   IMPRESSION:  *Respiratory failure, post-operative >>Vent support post op   SBO (small bowel obstruction)      >>> Status post laparotomy per [facility]  Emphysema, severe >>on home O2   Anemia, mild>hbg stable post op   No evidence of acute blood loss. No indication for PRBCs Leukocytosis  >>wbc tr up 9k>21K ? Etiology , low grade temps; suspect post-op stress rxn but must consider infxn Oliguria w/ nml renal fxn  >>?secondary to hypotension ?vol depleted  Hypotension post op> pressor dep >> ?sedation/pain meds related Hyperglycemia  >On D5 w/ elevated bs     H/O Anxiety  History of ovarian cancer, s/p remote resection  H/O: hypertension  H/O hyperlipidemia   PLAN/RECS:  Vent support - SBT and goal extubate 1/3 am Intermittent analgesia As needed   DVTp and SUP  IVF  Cont nebulized steroids and BDs Resume outpt meds when feasible and appropriate post-op Pan Cx on 1/3 and start empiric abx 1/3 check PCT and  Lactate  Add ICU Hyperglycemia procotol  Wean pressors for a MAP >/= 65 , IVF bolus 500cc  x 1    PARRETT,TAMMY NP - [facility]  [place] [facility]   [phone]  CC time 45 minutes  Levy Pupa, MD, PhD 11/08/2012, 9:30 AM [facility] [phone] or if no answer [phone]

## 2012-11-08 NOTE — Progress Notes (Signed)
eLink Physician-Brief Progress Note Patient Name: [name] DOB: 30-Jan-1941 MRN: [id number]  Date of Service  11/08/2012   HPI/Events of Note  Patient remains intubated.   eICU Interventions  Order for PCXR this AM to evaluate lung fields/ETT position   Intervention Category Minor Interventions: Clinical assessment - ordering diagnostic tests  Javonni Macke 11/08/2012, 5:26 AM

## 2012-11-08 NOTE — Progress Notes (Signed)
OT Cancellation Note  Patient Details Name: [name] MRN: [id number] DOB: Mar 10, 1941   Cancelled Treatment:    Reason Eval/Treat Not Completed: Medical issues which prohibited therapy (Pt remains intubated. Please re-order when appropriate.) Will sign off for now.  Netanel Yannuzzi A OTR/L [phone] 11/08/2012, 7:58 AM

## 2012-11-08 NOTE — Progress Notes (Signed)
eLink Physician-Brief Progress Note Patient Name: [name] DOB: Jul 09, 1941 MRN: [id number]  Date of Service  11/08/2012   HPI/Events of Note  Patient with oliguria in the setting of hypotension on NEO gtt for BP support.  Currently cuff pressure of 71/52 (57) on 15 mcg of NEO.  Nurse states patient is sensitive to sedation and BP drops erratically.    eICU Interventions  Plan: Continue to titrate NEO for BP support Aline to be placed for HD monitoring.    Intervention Category Intermediate Interventions: Hypotension - evaluation and management  DETERDING,ELIZABETH 11/08/2012, 12:33 AM

## 2012-11-08 NOTE — Progress Notes (Signed)
PT Cancellation Note  Patient Details Name: [name] MRN: [id number] DOB: 05/31/1941   Cancelled Treatment:    Reason Eval/Treat Not Completed: Medical issues which prohibited therapy (pt continues on vent. please reorder when appropriate.)   Rada Hay 11/08/2012, 7:58 AM

## 2012-11-08 NOTE — Progress Notes (Signed)
NUTRITION FOLLOW UP  Intervention:   1.  Parenteral nutrition; pt with poor PO since admission and NPO for several days prior to surgery.  Recommend initiation of nutrition support if pt expected to remain NPO or slow diet advancement anticipated due to pt with negligible intake x10 days. TPN per PharmD. 2.  Enteral nutrition; if appropriate per MD, recommend initiation of trickle feeds of Vital 1.2 @ 10 mL/hr continuous.  Advancements to be based on tolerance. 3.  Nutrition-related labs; recommend monitoring K, Mg, and Phos due to moderate risk for refeeding syndrome.   Nutrition Dx:   Inadequate oral intake, ongoing.  Monitor:   1.  Nutrition support; initiation if appropriate. 2.  Food/Beverage; diet advancement once appropriate.  Assessment:   Pt admitted (12/24) with shortness of breath.  Pt with small, but likely adequate intake PTA, however poor PO since admission.  Pt was started on Ensure daily but was unable to tolerate due to developing SBO.  Pt was then NPO for 5 days prior to ex lap (1/2). Patient is currently intubated on ventilator support.  MV: 6.7 L/min Temp:Temp (24hrs), Avg:97.8 F (36.6 C), Min:95.2 F (35.1 C), Max:99.2 F (37.3 C) Pt to be extubated this am.  NGT to remain in place.  Height: Ht Readings from Last 1 Encounters:  11/07/12 5\' 5"  (1.651 m)    Weight Status:   Wt Readings from Last 1 Encounters:  11/07/12 129 lb 10.1 oz (58.8 kg)    Re-estimated needs:  Kcal: 1368 Protein: 45-55g Fluid: >1.2 L/day  Skin: incision  Diet Order: NPO   Intake/Output Summary (Last 24 hours) at 11/08/12 1000 Last data filed at 11/08/12 0900  Gross per 24 hour  Intake 4568.59 ml  Output   1670 ml  Net 2898.59 ml    Last BM: 12/27   Labs:   Lab 11/08/12 0415 11/07/12 0433 11/06/12 0505  NA 131* 137 134*  K 4.4 3.6 3.5  CL 99 99 97  CO2 26 33* 31  BUN 12 8 11   CREATININE 0.69 0.49* 0.50  CALCIUM 8.0* 9.1 9.0  MG -- 1.8 --  PHOS -- -- 3.2    GLUCOSE 238* 101* 115*    CBG (last 3)  No results found for this basename: GLUCAP:3 in the last 72 hours  Scheduled Meds:   . albuterol  2.5 mg Nebulization Q6H  . antiseptic oral rinse  15 mL Mouth Rinse QID  . budesonide  0.25 mg Nebulization BID  . chlorhexidine  15 mL Mouth Rinse BID  . heparin  5,000 Units Subcutaneous Q8H  . insulin aspart  1-3 Units Subcutaneous Q4H  . ipratropium  0.5 mg Nebulization Q6H  . metoCLOPramide (REGLAN) injection  10 mg Intravenous Q6H  . pantoprazole (PROTONIX) IV  40 mg Intravenous Q24H    Continuous Infusions:   . dextrose 5 % and 0.45 % NaCl with KCl 20 mEq/L 100 mL/hr at 11/08/12 0129    Loyce Dys, MS RD LDN Clinical Inpatient Dietitian Pager: [phone] Weekend/After hours pager: [phone]

## 2012-11-08 NOTE — Procedures (Signed)
Extubation Procedure Note  Patient Details:   Name: LYZETTE REINHARDT DOB: [birth year] MRN: [id number]   Airway Documentation:   pt extubated to 3lpm 02, sx prior to extubation, incentive spirometer done post procedure, pt tolerating well.  Evaluation  O2 sats: stable throughout Complications: No apparent complications Patient did tolerate procedure well. Bilateral Breath Sounds: Clear;Diminished Suctioning: Airway Yes  Renae Fickle 11/08/2012, 10:56 AM

## 2012-11-09 ENCOUNTER — Inpatient Hospital Stay (HOSPITAL_COMMUNITY): Payer: Medicare Other

## 2012-11-09 DIAGNOSIS — R509 Fever, unspecified: Secondary | ICD-10-CM | POA: Diagnosis not present

## 2012-11-09 DIAGNOSIS — J189 Pneumonia, unspecified organism: Secondary | ICD-10-CM | POA: Diagnosis not present

## 2012-11-09 DIAGNOSIS — D649 Anemia, unspecified: Secondary | ICD-10-CM

## 2012-11-09 DIAGNOSIS — D72829 Elevated white blood cell count, unspecified: Secondary | ICD-10-CM | POA: Diagnosis not present

## 2012-11-09 LAB — URINE CULTURE: Colony Count: NO GROWTH

## 2012-11-09 LAB — GLUCOSE, CAPILLARY
Glucose-Capillary: 119 mg/dL — ABNORMAL HIGH (ref 70–99)
Glucose-Capillary: 127 mg/dL — ABNORMAL HIGH (ref 70–99)
Glucose-Capillary: 131 mg/dL — ABNORMAL HIGH (ref 70–99)

## 2012-11-09 LAB — BASIC METABOLIC PANEL
CO2: 28 mEq/L (ref 19–32)
Calcium: 8 mg/dL — ABNORMAL LOW (ref 8.4–10.5)
Creatinine, Ser: 0.63 mg/dL (ref 0.50–1.10)

## 2012-11-09 LAB — CBC
MCH: 31.2 pg (ref 26.0–34.0)
MCV: 95.4 fL (ref 78.0–100.0)
Platelets: 223 10*3/uL (ref 150–400)
RBC: 2.85 MIL/uL — ABNORMAL LOW (ref 3.87–5.11)
RDW: 13 % (ref 11.5–15.5)

## 2012-11-09 LAB — MAGNESIUM: Magnesium: 1.3 mg/dL — ABNORMAL LOW (ref 1.5–2.5)

## 2012-11-09 MED ORDER — SODIUM CHLORIDE 0.9 % IV BOLUS (SEPSIS)
500.0000 mL | Freq: Once | INTRAVENOUS | Status: AC
  Administered 2012-11-09: 500 mL via INTRAVENOUS

## 2012-11-09 MED ORDER — MENTHOL 3 MG MT LOZG: 1.0000 | LOZENGE | OROMUCOSAL | Status: DC | PRN

## 2012-11-09 MED ORDER — PROMETHAZINE HCL 25 MG/ML IJ SOLN: 12.5000 mg | Freq: Four times a day (QID) | INTRAMUSCULAR | Status: DC | PRN

## 2012-11-09 MED ORDER — MAGNESIUM SULFATE 40 MG/ML IJ SOLN
2.0000 g | Freq: Once | INTRAMUSCULAR | Status: AC
  Administered 2012-11-09: 2 g via INTRAVENOUS
  Filled 2012-11-09: qty 50

## 2012-11-09 MED ORDER — FENTANYL CITRATE 0.05 MG/ML IJ SOLN
50.0000 ug | INTRAMUSCULAR | Status: DC | PRN
  Administered 2012-11-11 – 2012-11-21 (×52): 50 ug via INTRAVENOUS
  Filled 2012-11-09 (×50): qty 2

## 2012-11-09 MED ORDER — METOCLOPRAMIDE HCL 5 MG/ML IJ SOLN
5.0000 mg | Freq: Four times a day (QID) | INTRAMUSCULAR | Status: DC
  Administered 2012-11-09 – 2012-11-18 (×36): 5 mg via INTRAVENOUS
  Filled 2012-11-09 (×4): qty 1
  Filled 2012-11-09: qty 2
  Filled 2012-11-09 (×13): qty 1
  Filled 2012-11-09: qty 2
  Filled 2012-11-09 (×14): qty 1
  Filled 2012-11-09: qty 2
  Filled 2012-11-09 (×5): qty 1

## 2012-11-09 MED ORDER — FENTANYL CITRATE 0.05 MG/ML IJ SOLN
25.0000 ug | INTRAMUSCULAR | Status: DC | PRN
  Administered 2012-11-09 – 2012-11-19 (×4): 25 ug via INTRAVENOUS
  Filled 2012-11-09 (×6): qty 2

## 2012-11-09 MED ORDER — BISACODYL 10 MG RE SUPP
10.0000 mg | Freq: Two times a day (BID) | RECTAL | Status: DC | PRN
  Administered 2012-11-17: 10 mg via RECTAL
  Filled 2012-11-09: qty 1

## 2012-11-09 MED ORDER — DIPHENHYDRAMINE HCL 50 MG/ML IJ SOLN: 12.5000 mg | Freq: Four times a day (QID) | INTRAMUSCULAR | Status: DC | PRN

## 2012-11-09 MED ORDER — SODIUM PHOSPHATE 3 MMOLE/ML IV SOLN
30.0000 mmol | Freq: Once | INTRAVENOUS | Status: AC
  Administered 2012-11-09: 30 mmol via INTRAVENOUS
  Filled 2012-11-09: qty 10

## 2012-11-09 MED ORDER — PHENOL 1.4 % MT LIQD: 2.0000 | OROMUCOSAL | Status: DC | PRN

## 2012-11-09 MED ORDER — HEPARIN SODIUM (PORCINE) 5000 UNIT/ML IJ SOLN
5000.0000 [IU] | Freq: Three times a day (TID) | INTRAMUSCULAR | Status: DC
  Administered 2012-11-09 – 2012-11-20 (×33): 5000 [IU] via SUBCUTANEOUS
  Filled 2012-11-09 (×36): qty 1

## 2012-11-09 MED ORDER — ACETAMINOPHEN 650 MG RE SUPP: 650.0000 mg | Freq: Four times a day (QID) | RECTAL | Status: DC | PRN

## 2012-11-09 MED ORDER — LACTATED RINGERS IV BOLUS (SEPSIS): 1000.0000 mL | Freq: Three times a day (TID) | INTRAVENOUS | Status: DC | PRN

## 2012-11-09 MED ORDER — ALUM & MAG HYDROXIDE-SIMETH 200-200-20 MG/5ML PO SUSP
30.0000 mL | Freq: Four times a day (QID) | ORAL | Status: DC | PRN
Start: 2012-11-09 — End: 2012-11-29

## 2012-11-09 MED ORDER — MAGIC MOUTHWASH
15.0000 mL | Freq: Four times a day (QID) | ORAL | Status: DC | PRN
  Administered 2012-11-16: 15 mL via ORAL
  Filled 2012-11-09 (×2): qty 15

## 2012-11-09 MED ORDER — LIP MEDEX EX OINT
1.0000 "application " | TOPICAL_OINTMENT | Freq: Two times a day (BID) | CUTANEOUS | Status: DC
  Administered 2012-11-09 – 2012-11-29 (×32): 1 via TOPICAL
  Filled 2012-11-09 (×2): qty 7

## 2012-11-09 NOTE — Plan of Care (Signed)
Problem: Phase I Progression Outcomes Goal: Initial discharge plan identified Outcome: Progressing OOB to chair first time with PT today, pt states that she would like to go home after discharge and pay private hired nursing staff to assist with needs for short period of time.

## 2012-11-09 NOTE — Progress Notes (Signed)
[name] [id number] 03-09-1941   Subjective:  Pain controlled RN Velna Hatchet in room Getting lytes replaced  Objective:  Vital signs:  Filed Vitals:   11/09/12 0200 11/09/12 0225 11/09/12 0400 11/09/12 0616  BP: 106/62  120/57 135/70  Pulse: 95  95 125  Temp:   98 F (36.7 C)   TempSrc:   Oral   Resp: 12  12 19   Height:      Weight:      SpO2: 99% 100% 100% 97%    Last BM Date: 11/01/12 (per chart)  Intake/Output   Yesterday:  01/03 0701 - 01/04 0700 In: 3511.8 [I.V.:2386.3; NG/GT:40; IV Piggyback:1085.5] Out: 1640 [Urine:1470; Emesis/NG output:170] This shift:     Bowel function:  Flatus: n  BM: n  Physical Exam:  General: Pt awake/alert/oriented x4 in no acute distress Eyes: PERRL, normal EOM.  Sclera clear.  No icterus Neuro: CN II-XII intact w/o focal sensory/motor deficits. Lymph: No head/neck/groin lymphadenopathy Psych:  No delerium/psychosis/paranoia HENT: Normocephalic, Mucus membranes moist.  No thrush Neck: Supple, No tracheal deviation Chest: No chest wall pain w good excursion CV:  Pulses intact.  Regular rhythm MS: Normal AROM mjr joints.  No obvious deformity Abdomen: Soft.  Nondistended.  Incision closed.  Mildly tender at incisions only.  No incarcerated hernias. Ext:  SCDs BLE.  No mjr edema.  No cyanosis Skin: No petechiae / purpurae.  Stable ecchymosis  Problem List:  Principal Problem:  *Respiratory failure, post-operative Active Problems:  Anxiety  Anemia  SBO (small bowel obstruction)  Emphysema, severe  Status post laparotomy  History of ovarian cancer, s/p remote resection  H/O: hypertension  H/O hyperlipidemia   Assessment  [name]  72 y.o. female  2 Days Post-Op  Procedure(s): LAPAROSCOPY DIAGNOSTIC EXPLORATORY LAPAROTOMY LAPAROSCOPIC LYSIS OF ADHESIONS LYSIS OF ADHESION  Stabilizing  Plan:  -correct lytes -NGT until flatus -control BP / HR -anemia - lower Hgb prob related to inc IVF and post op  losses but follow.  Hold anticoag if worse.  Transfuse if hgb <7 if ICU agrees -VTE prophylaxis- SCDs, etc -mobilize as tolerated to help recovery  Ardeth Sportsman, M.D., F.A.C.S. Gastrointestinal and Minimally Invasive Surgery [facility] [address], [address] [place], [place] [address] [phone] Main / Paging [phone] Voice Mail   11/09/2012  CARE TEAM:  PCP: Mickie Hillier, MD  Outpatient Care Team: Patient Care Team: Catha Gosselin, MD as PCP - General (Family Medicine)  Inpatient Treatment Team: Treatment Team: Attending Provider: Merwyn Katos, MD; Respiratory Therapist: Liz Beach, RRT; Consulting Physician: Md Montez Morita, MD; Rounding Team: Md Pccm, MD; Registered Nurse: Beatris Si, RN; Registered Nurse: Doreene Eland, RN; Registered Nurse: Gita Kudo Main, RN   Results:   Labs: Results for orders placed during the hospital encounter of 10/29/12 (from the past 48 hour(s))  SURGICAL PCR SCREEN     Status: Normal   Collection Time   11/07/12  9:06 AM      Component Value Range Comment   MRSA, PCR NEGATIVE  NEGATIVE    Staphylococcus aureus NEGATIVE  NEGATIVE   TYPE AND SCREEN     Status: Normal   Collection Time   11/07/12 11:30 AM      Component Value Range Comment   ABO/RH(D) O NEG      Antibody Screen NEG      Sample Expiration 11/10/2012     ABO/RH     Status: Normal  Collection Time   11/07/12 11:30 AM      Component Value Range Comment   ABO/RH(D) O NEG     BLOOD GAS, ARTERIAL     Status: Abnormal   Collection Time   11/08/12  3:53 AM      Component Value Range Comment   FIO2 0.30      Delivery systems VENTILATOR      Mode PRESSURE REGULATED VOLUME CONTROL      VT 0.450      Rate 14.0      Peep/cpap 5.0      pH, Arterial 7.398  7.350 - 7.450    pCO2 arterial 42.2  35.0 - 45.0 mmHg    pO2, Arterial 114.0 (*) 80.0 - 100.0 mmHg    Bicarbonate 25.5 (*) 20.0 - 24.0 mEq/L    TCO2 23.3  0 - 100 mmol/L      Acid-Base Excess 1.0  0.0 - 2.0 mmol/L    O2 Saturation 99.1      Patient temperature 98.6      Collection site ARTERIAL LINE      Drawn by [id number]      Sample type ARTERIAL DRAW     CBC     Status: Abnormal   Collection Time   11/08/12  4:15 AM      Component Value Range Comment   WBC 21.1 (*) 4.0 - 10.5 K/uL    RBC 3.50 (*) 3.87 - 5.11 MIL/uL    Hemoglobin 11.1 (*) 12.0 - 15.0 g/dL    HCT 41.3 (*) 24.4 - 46.0 %    MCV 95.1  78.0 - 100.0 fL    MCH 31.7  26.0 - 34.0 pg    MCHC 33.3  30.0 - 36.0 g/dL    RDW 01.0  27.2 - 53.6 %    Platelets 269  150 - 400 K/uL   BASIC METABOLIC PANEL     Status: Abnormal   Collection Time   11/08/12  4:15 AM      Component Value Range Comment   Sodium 131 (*) 135 - 145 mEq/L    Potassium 4.4  3.5 - 5.1 mEq/L    Chloride 99  96 - 112 mEq/L    CO2 26  19 - 32 mEq/L    Glucose, Bld 238 (*) 70 - 99 mg/dL    BUN 12  6 - 23 mg/dL    Creatinine, Ser 6.44  0.50 - 1.10 mg/dL    Calcium 8.0 (*) 8.4 - 10.5 mg/dL    GFR calc non Af Amer 86 (*) >90 mL/min    GFR calc Af Amer >90  >90 mL/min   GLUCOSE, CAPILLARY     Status: Abnormal   Collection Time   11/08/12 11:16 AM      Component Value Range Comment   Glucose-Capillary 167 (*) 70 - 99 mg/dL   LACTIC ACID, PLASMA     Status: Normal   Collection Time   11/08/12 12:20 PM      Component Value Range Comment   Lactic Acid, Venous 1.6  0.5 - 2.2 mmol/L   PROCALCITONIN     Status: Normal   Collection Time   11/08/12 12:20 PM      Component Value Range Comment   Procalcitonin 2.86     GLUCOSE, CAPILLARY     Status: Abnormal   Collection Time   11/08/12  3:40 PM      Component Value Range Comment   Glucose-Capillary 155 (*)  70 - 99 mg/dL   GLUCOSE, CAPILLARY     Status: Abnormal   Collection Time   11/08/12  7:34 PM      Component Value Range Comment   Glucose-Capillary 132 (*) 70 - 99 mg/dL   GLUCOSE, CAPILLARY     Status: Abnormal   Collection Time   11/09/12  3:36 AM      Component Value Range Comment    Glucose-Capillary 119 (*) 70 - 99 mg/dL   CBC     Status: Abnormal   Collection Time   11/09/12  4:00 AM      Component Value Range Comment   WBC 17.3 (*) 4.0 - 10.5 K/uL    RBC 2.85 (*) 3.87 - 5.11 MIL/uL    Hemoglobin 8.9 (*) 12.0 - 15.0 g/dL    HCT 78.2 (*) 95.6 - 46.0 %    MCV 95.4  78.0 - 100.0 fL    MCH 31.2  26.0 - 34.0 pg    MCHC 32.7  30.0 - 36.0 g/dL    RDW 21.3  08.6 - 57.8 %    Platelets 223  150 - 400 K/uL   BASIC METABOLIC PANEL     Status: Abnormal   Collection Time   11/09/12  4:00 AM      Component Value Range Comment   Sodium 134 (*) 135 - 145 mEq/L    Potassium 3.9  3.5 - 5.1 mEq/L    Chloride 102  96 - 112 mEq/L    CO2 28  19 - 32 mEq/L    Glucose, Bld 130 (*) 70 - 99 mg/dL    BUN 7  6 - 23 mg/dL    Creatinine, Ser 4.69  0.50 - 1.10 mg/dL    Calcium 8.0 (*) 8.4 - 10.5 mg/dL    GFR calc non Af Amer 88 (*) >90 mL/min    GFR calc Af Amer >90  >90 mL/min   MAGNESIUM     Status: Abnormal   Collection Time   11/09/12  4:00 AM      Component Value Range Comment   Magnesium 1.3 (*) 1.5 - 2.5 mg/dL   PHOSPHORUS     Status: Abnormal   Collection Time   11/09/12  4:00 AM      Component Value Range Comment   Phosphorus 2.0 (*) 2.3 - 4.6 mg/dL   PROCALCITONIN     Status: Normal   Collection Time   11/09/12  4:00 AM      Component Value Range Comment   Procalcitonin 1.40       Imaging / Studies: Dg Chest 1 View  11/07/2012  *RADIOLOGY REPORT*  Clinical Data: Respiratory failure.  CHEST - 1 VIEW  Comparison: 10/29/2012.  Findings: ET tube has been inserted and lies with its tip 6.2 cm above carina.  Central venous catheter tip from right IJ approach lies with its tip at the distal SVC.  There is no pneumothorax. Nasogastric tube tip in stomach.  No infiltrates or significant atelectasis.  IMPRESSION: ET tube, central venous catheter, nasogastric tube all good position.  No pneumothorax.   Original Report Authenticated By: Davonna Belling, M.D.    Dg Chest Port 1  View  11/08/2012  *RADIOLOGY REPORT*  Clinical Data: Endotracheal tube placement.  PORTABLE CHEST - 1 VIEW  Comparison: Chest radiograph performed 11/07/2012  Findings: The patient's endotracheal tube is seen ending 4-5 cm above the carina.  An enteric tube is noted extending below the diaphragm.  A  right IJ line is noted ending about the mid SVC.  Lucency at the upper lung zones suggests underlying emphysema.  The lungs are otherwise grossly clear.  No focal consolidation, pleural effusion or pneumothorax is seen.  The cardiomediastinal silhouette is normal in size; calcification is noted within the aortic arch.  No acute osseous abnormalities are identified.  IMPRESSION:  1.  Endotracheal tube seen ending 4-5 cm above the carina. 2.  Underlying emphysema noted; lungs otherwise grossly clear.   Original Report Authenticated By: Tonia Ghent, M.D.     Medications / Allergies: per chart  Antibiotics: Anti-infectives     Start     Dose/Rate Route Frequency Ordered Stop   11/09/12 0000   vancomycin (VANCOCIN) 750 mg in sodium chloride 0.9 % 150 mL IVPB        750 mg 150 mL/hr over 60 Minutes Intravenous Every 12 hours 11/08/12 1023     11/08/12 1200  piperacillin-tazobactam (ZOSYN) IVPB 3.375 g       3.375 g 12.5 mL/hr over 240 Minutes Intravenous Every 8 hours 11/08/12 1023     11/08/12 1100   vancomycin (VANCOCIN) IVPB 1000 mg/200 mL premix        1,000 mg 200 mL/hr over 60 Minutes Intravenous  Once 11/08/12 1023 11/08/12 1322   11/04/12 1200   levofloxacin (LEVAQUIN) IVPB 500 mg  Status:  Discontinued        500 mg 100 mL/hr over 60 Minutes Intravenous Daily 11/04/12 1124 11/05/12 1436   11/01/12 1615   fluconazole (DIFLUCAN) tablet 200 mg        200 mg Oral  Once 11/01/12 1607 11/01/12 1636   11/01/12 1000   levofloxacin (LEVAQUIN) IVPB 750 mg  Status:  Discontinued        750 mg 100 mL/hr over 90 Minutes Intravenous Every 48 hours 10/30/12 1108 10/31/12 1428   11/01/12 1000    levofloxacin (LEVAQUIN) tablet 750 mg  Status:  Discontinued        750 mg Oral Every 48 hours 10/31/12 1428 11/04/12 1124   10/29/12 1045   levofloxacin (LEVAQUIN) IVPB 750 mg  Status:  Discontinued        750 mg 100 mL/hr over 90 Minutes Intravenous Every 24 hours 10/29/12 1034 10/30/12 1108

## 2012-11-09 NOTE — Progress Notes (Signed)
Pt profile: 71 yowf admitted by [facility] 12/24 with dx of aecopd and treated accordingly with improvement in respiratory symptoms. She developed progressive abdominal distention with KUB and CT abd c/w SBO. Underwent exploratory laparotomy, lysis of adhesions 1/02. Returned to ICU on vent. PCCM asked to assist with vent/ICU mgmt   Lines, Tubes, etc: ETT 1/02 >> 1/03 R IJ CVL 1/02 >>   Microbiology: MRSA PCR 1/02 >> NEG 1/3 BC x 2 >>  1/3 UC >>  Antibiotics:  vanco (empiric, fever) 1/3 >> Zosyn (empiric, fever) 1/3 >>    Studies/Events: 12/31 CT abd: Small bowel obstruction with dilated small bowel to the level of the distal ileum in the region of the pelvis where there is abrupt change of caliber which may be related to adhesions 1/2 Hypotension post op >pressor support  1/3 low grade fever/elevated WBC >pan cx   Consults:  CCS 12/30   Best Practice: DVT: SQ hep SUP: PPI Nutrition: none Glycemic control: SSI Sedation/analgesia: PRN fentanyl   [name]: No distress. No new complaints. Fever resolved   [name]: Filed Vitals:   11/09/12 1622  BP: 128/61  Pulse: 104  Temp:   Resp: 16    Gen: NAD HEENT: WNL Neck: no JVD Chest: no wheezes Cardiac: RRR s M Abd: hypoactive, dressing dry /clean  Ext: cool, no edema Neuro: a/o x 3 , + F/C, MAEs  BMET    Component Value Date/Time   NA 134* 11/09/2012 0400   K 3.9 11/09/2012 0400   CL 102 11/09/2012 0400   CO2 28 11/09/2012 0400   GLUCOSE 130* 11/09/2012 0400   BUN 7 11/09/2012 0400   CREATININE 0.63 11/09/2012 0400   CALCIUM 8.0* 11/09/2012 0400   GFRNONAA 88* 11/09/2012 0400   GFRAA >90 11/09/2012 0400    CBC    Component Value Date/Time   WBC 17.3* 11/09/2012 0400   RBC 2.85* 11/09/2012 0400   HGB 8.9* 11/09/2012 0400   HCT 27.2* 11/09/2012 0400   PLT 223 11/09/2012 0400   MCV 95.4 11/09/2012 0400   MCH 31.2 11/09/2012 0400   MCHC 32.7 11/09/2012 0400   RDW 13.0 11/09/2012 0400   LYMPHSABS 0.7 08/15/2008 0550   MONOABS 0.2 08/15/2008 0550   EOSABS 0.0 08/15/2008 0550   BASOSABS 0.0 08/15/2008 0550    CXR: Vague RLL opacity   IMPRESSION:  *Respiratory failure, post-operative, resolved  SBO (small bowel obstruction)      Status post laparotomy per CCS  Emphysema, severe  Anemia, mild>hbg stable post op   No evidence of acute blood loss. No indication for PRBCs Leukocytosis, fever 1/03, vague RLL opacity 1/04, mildly elevated PCT  HAP vs abdominal source (less likely) Oliguria, resolved  Hypotension, resolved  Hyperglycemia, mild  Controlled on SSI   H/O Anxiety  History of ovarian cancer, s/p remote resection  H/O: hypertension  H/O hyperlipidemia   PLAN/RECS:  Cont ICU/SDU level of care Cont BDs Cont empiric abx Cont SSI   Billy Fischer, MD ; [facility] service Mobile [phone].  After 5:30 PM or weekends, call [phone]

## 2012-11-09 NOTE — Progress Notes (Signed)
eLink Physician-Brief Progress Note Patient Name: [name] DOB: 11-Dec-1940 MRN: [id number]  Date of Service  11/09/2012   HPI/Events of Note  Phos 2.0 Magnesium 1.3   eICU Interventions  Plan: Replace Phos and Magnesium   Intervention Category Intermediate Interventions: Electrolyte abnormality - evaluation and management  Chioke Noxon 11/09/2012, 5:10 AM

## 2012-11-09 NOTE — Evaluation (Signed)
Physical Therapy Re-Evaluation Patient Details Name: [name] MRN: [id number] DOB: July 27, 1941 Today's Date: 11/09/2012 Time: 6962-9528 PT Time Calculation (min): 25 min  PT Assessment / Plan / Recommendation Clinical Impression  Pt underwent exploratory laparotomy with lysis of adhesions on 11/07/12 and is now seen for PT re-evaluation.  She continues with anxiety  exacerbated with significant abdominal pain from OR.  She is able to continue to move well withing limites of her COPD.  Discharge plan remains the same for HHPT.  Pt states she is interested in hiring a nurse to be at home with her for about a week    PT Assessment       Follow Up Recommendations  Home health PT    Does the patient have the potential to tolerate intense rehabilitation      Barriers to Discharge Decreased caregiver support pt states she wants to hire a nurse to help her for about a week    Equipment Recommendations  Other (comment) (rollator to help carry O2 tank and provide balance suppport)    Recommendations for Other Services     Frequency Min 3X/week    Precautions / Restrictions Precautions Precautions: Fall Precaution Comments: wears 2L O2 chronically, monitor O2 sats and now NG tube and multiple lines and tubes in ICU Restrictions Weight Bearing Restrictions: No   Pertinent Vitals/Pain Pt with c/o pain in abdomen with mobility      Mobility  Bed Mobility Bed Mobility: Supine to Sit Supine to Sit: 5: Supervision Sit to Supine: 4: Min assist;HOB elevated Details for Bed Mobility Assistance: assist for lines and tubes and to maneuver on hospital bed.  Maintained HOB in elevated positiona and used flat spin technique to get patient to EOB and protect abdomen from stress Transfers Transfers: Sit to Stand;Stand to Sit Sit to Stand: From bed;4: Min assist Stand to Sit: To chair/3-in-1 Details for Transfer Assistance: increased time Ambulation/Gait Ambulation/Gait Assistance: 4: Min  guard Ambulation Distance (Feet): 2 Feet Assistive device: Rolling Luecke Ambulation/Gait Assistance Details: pt very anxious.  Asked for O2 to be increased to 4L for mobility and RN Ok'd it. All equipment positioned to make it easy for pt to transfer with RW Gait Pattern: Step-to pattern General Gait Details: pt was able to use RW for support to step around to chair Stairs: No Wheelchair Mobility Wheelchair Mobility: No    Shoulder Instructions     Exercises     PT Diagnosis: Difficulty walking;Acute pain;Generalized weakness  PT Problem List: Decreased strength;Decreased activity tolerance;Decreased balance;Decreased mobility;Decreased knowledge of use of DME;Cardiopulmonary status limiting activity;Decreased safety awareness PT Treatment Interventions: DME instruction;Gait training;Functional mobility training;Stair training;Therapeutic exercise;Balance training;Patient/family education   PT Goals Acute Rehab PT Goals PT Goal Formulation: With patient Time For Goal Achievement: 11/14/12 Potential to Achieve Goals: Good Pt will go Supine/Side to Sit: with modified independence PT Goal: Supine/Side to Sit - Progress: Progressing toward goal Pt will go Sit to Stand: with modified independence PT Goal: Sit to Stand - Progress: Progressing toward goal Pt will go Stand to Sit: with modified independence PT Goal: Stand to Sit - Progress: Progressing toward goal Pt will Ambulate: 51 - 150 feet;with modified independence;with least restrictive assistive device PT Goal: Ambulate - Progress: Progressing toward goal Pt will Go Up / Down Stairs: 1-2 stairs;with modified independence;with least restrictive assistive device  Visit Information  Last PT Received On: 11/09/12 Assistance Needed: +2 (for lines and tubes)    Subjective Data  Subjective: "I know I  need to" Patient Stated Goal: to go home with a nurse for about a week   Prior Functioning  Home Living Lives With:  Alone Available Help at Discharge: Friend(s) Type of Home: House Home Access: Stairs to enter Secretary/administrator of Steps: 1 Home Layout: One level Bathroom Shower/Tub: Tub/shower unit (sits at bottom of tub) Bathroom Toilet: Standard Home Adaptive Equipment: None Prior Function Level of Independence: Independent Driving: Yes Comments: pt reports she did not use an assistive device prior to admission Communication Communication: No difficulties    Cognition  Overall Cognitive Status: Appears within functional limits for tasks assessed/performed Arousal/Alertness: Awake/alert Orientation Level: Appears intact for tasks assessed Behavior During Session: [facility] for tasks performed    Extremity/Trunk Assessment Right Lower Extremity Assessment RLE ROM/Strength/Tone: [facility] for tasks assessed Left Lower Extremity Assessment LLE ROM/Strength/Tone: [facility] for tasks assessed Trunk Assessment Trunk Assessment: Kyphotic;Other exceptions Trunk Exceptions: Pt with elevated shoulders due to accessory muscle use when breathing. Pt now with midlne abdominal inscision approximated by staples.  No dressing per RN   Balance Balance Balance Assessed: Yes Static Sitting Balance Static Sitting - Balance Support: Bilateral upper extremity supported Static Sitting - Level of Assistance: 7: Independent Static Sitting - Comment/# of Minutes: sitting on EOB to prepare for transfer.  No c/o dizziness  End of Session PT - End of Session Activity Tolerance: Patient limited by fatigue (pt also limited by anxiety re O2 and pain) Patient left: in chair;with nursing in room Nurse Communication: Mobility status  GP     Rosey Bath K. Manson Passey, PT 11/09/2012, 3:45 PM

## 2012-11-09 NOTE — Progress Notes (Signed)
Decreased urine output; the patient NPO   NS bolus ordered

## 2012-11-10 DIAGNOSIS — E876 Hypokalemia: Secondary | ICD-10-CM

## 2012-11-10 LAB — CBC
Hemoglobin: 9.1 g/dL — ABNORMAL LOW (ref 12.0–15.0)
MCH: 31.9 pg (ref 26.0–34.0)
MCV: 94.7 fL (ref 78.0–100.0)
Platelets: 264 10*3/uL (ref 150–400)
RBC: 2.85 MIL/uL — ABNORMAL LOW (ref 3.87–5.11)
WBC: 14.8 10*3/uL — ABNORMAL HIGH (ref 4.0–10.5)

## 2012-11-10 LAB — BASIC METABOLIC PANEL
CO2: 27 mEq/L (ref 19–32)
GFR calc non Af Amer: >90 mL/min (ref >90)
Glucose, Bld: 113 mg/dL — ABNORMAL HIGH (ref 70–99)
Potassium: 3.9 mEq/L (ref 3.5–5.1)
Sodium: 136 mEq/L (ref 135–145)

## 2012-11-10 LAB — GLUCOSE, CAPILLARY: Glucose-Capillary: 110 mg/dL — ABNORMAL HIGH (ref 70–99)

## 2012-11-10 LAB — PROCALCITONIN: Procalcitonin: 0.54 ng/mL

## 2012-11-10 NOTE — Progress Notes (Signed)
Pt profile: 71 yowf admitted by [facility] 12/24 with dx of aecopd and treated accordingly with improvement in respiratory symptoms. She developed progressive abdominal distention with KUB and CT abd c/w SBO. Underwent exploratory laparotomy, lysis of adhesions 1/02. Returned to ICU on vent. PCCM asked to assist with vent/ICU mgmt   Lines, Tubes, etc: ETT 1/02 >> 1/03 R IJ CVL 1/02 >>   Microbiology: MRSA PCR 1/02 >> NEG 1/3 BC x 2 >>  1/3 UC >> NEG  Antibiotics:  vanco (empiric, fever) 1/3 >> 1/04 Zosyn (empiric, fever) 1/3 >> 1/10 (stop date ordered)   Studies/Events: 12/31 CT abd: Small bowel obstruction with dilated small bowel to the level of the distal ileum in the region of the pelvis where there is abrupt change of caliber which may be related to adhesions 1/2 Hypotension post op >pressor support  1/3 low grade fever/elevated WBC >pan cx   Consults:  CCS 12/30   Best Practice: DVT: SQ hep SUP: PPI Nutrition: none Glycemic control: SSI Sedation/analgesia: PRN fentanyl   [name]: No distress. No new complaints. Fever resolved. Minimally hungry. No flatus yet   [name]: Filed Vitals:   11/10/12 0900  BP:   Pulse: 124  Temp:   Resp: 20    Gen: NAD HEENT: WNL Neck: no JVD Chest: no wheezes Cardiac: RRR s M Abd: hypoactive, dressing dry /clean  Ext: cool, no edema Neuro: a/o x 3 , + F/C, MAEs  BMET    Component Value Date/Time   NA 136 11/10/2012 0400   K 3.9 11/10/2012 0400   CL 103 11/10/2012 0400   CO2 27 11/10/2012 0400   GLUCOSE 113* 11/10/2012 0400   BUN 6 11/10/2012 0400   CREATININE 0.59 11/10/2012 0400   CALCIUM 8.3* 11/10/2012 0400   GFRNONAA >90 11/10/2012 0400   GFRAA >90 11/10/2012 0400    CBC    Component Value Date/Time   WBC 14.8* 11/10/2012 0400   RBC 2.85* 11/10/2012 0400   HGB 9.1* 11/10/2012 0400   HCT 27.0* 11/10/2012 0400   PLT 264 11/10/2012 0400   MCV 94.7 11/10/2012 0400   MCH 31.9 11/10/2012 0400   MCHC 33.7 11/10/2012 0400   RDW 12.9 11/10/2012 0400   LYMPHSABS 0.7 08/15/2008 0550   MONOABS 0.2 08/15/2008 0550   EOSABS 0.0 08/15/2008 0550   BASOSABS 0.0 08/15/2008 0550    CXR: no new film   IMPRESSION:  *Respiratory failure, post-operative, resolved  SBO (small bowel obstruction)      Status post laparotomy per CCS  Emphysema, severe  Anemia, mild>hbg stable post op   No evidence of acute blood loss. No indication for PRBCs Leukocytosis, fever 1/03, vague RLL opacity 1/04, mildly elevated PCT  HAP vs abdominal source (less likely) Oliguria, resolved  Hypotension, resolved  Hyperglycemia, mild  Controlled on SSI   H/O Anxiety  History of ovarian cancer, s/p remote resection  H/O: hypertension  H/O hyperlipidemia   PLAN/RECS:  Transfer to [employer] BDs Cont empiric abx with stop date as ordered D/C SSI Nutrition per CCS Leave CVL for now - poor PIV access. Would not remove until taking POs  Billy Fischer, MD ; [facility] [phone].  After 5:30 PM or weekends, call [phone]

## 2012-11-10 NOTE — Progress Notes (Signed)
[name] [id number] 09-Aug-1941   Subjective:  Pain controlled RN Vernona Rieger in room Feeling better Wondering if NGT can come out Sat in chair x2  Objective:  Vital signs:  Filed Vitals:   11/10/12 0214 11/10/12 0400 11/10/12 0600 11/10/12 0735  BP:  113/60 100/53   Pulse:  95 84   Temp:  98.6 F (37 C)    TempSrc:  Oral    Resp:  16 11   Height:      Weight:  114 lb 6.7 oz (51.9 kg)    SpO2: 99% 100% 100% 100%    Last BM Date: 11/01/12 (per chart)  Intake/Output   Yesterday:  01/04 0701 - 01/05 0700 In: 1693.5 [I.V.:1430; IV Piggyback:263.5] Out: 1025 [Urine:700; Emesis/NG output:325] This shift:     Bowel function:  Flatus: n  BM: n NGT clogged filter - I d/c'd.  Flushes well.  Physical Exam:  General: Pt awake/alert/oriented x4 in no acute distress Eyes: PERRL, normal EOM.  Sclera clear.  No icterus Neuro: CN II-XII intact w/o focal sensory/motor deficits. Lymph: No head/neck/groin lymphadenopathy Psych:  No delerium/psychosis/paranoia HENT: Normocephalic, Mucus membranes moist.  No thrush Neck: Supple, No tracheal deviation Chest: No chest wall pain w good excursion CV:  Pulses intact.  Regular rhythm MS: Normal AROM mjr joints.  No obvious deformity Abdomen: Soft.  Mod distended.  Incision closed.  Mildly tender at incisions only.  No peritonitis.  No incarcerated hernias. Ext:  SCDs BLE.  No mjr edema.  No cyanosis Skin: No petechiae / purpurae.  Stable ecchymosis  Problem List:  Principal Problem:  *Respiratory failure, post-operative Active Problems:  Anxiety  Anemia  SBO (small bowel obstruction)  Emphysema, severe  Status post laparotomy  History of ovarian cancer, s/p remote resection  H/O: hypertension  H/O hyperlipidemia  Fever  Leukocytosis  HAP (hospital-acquired pneumonia)   Assessment  [name]  72 y.o. female  3 Days Post-Op  Procedure(s): LAPAROSCOPY DIAGNOSTIC EXPLORATORY LAPAROTOMY LAPAROSCOPIC LYSIS OF  ADHESIONS LYSIS OF ADHESION  Stabilizing s/p LOA for SBO  Plan:  -correct lytes -NGT until flatus.  Stressed need to help SB recover post-op -control BP / HR -anemia - lower Hgb prob related to inc IVF and post op losses but follow.  Stable.   Hold anticoag if worse.  Transfuse if hgb <7 if ICU agrees -VTE prophylaxis- SCDs, etc -mobilize as tolerated to help recovery.  PT/OT evals  -prob OK to transfer to floor if CCM agrees  Ardeth Sportsman, M.D., F.A.C.S. Gastrointestinal and Minimally Invasive Surgery [facility] [address], [address] [place], [place] [address] [phone] Main / Paging [phone] Voice Mail   11/10/2012  CARE TEAM:  PCP: Mickie Hillier, MD  Outpatient Care Team: Patient Care Team: Catha Gosselin, MD as PCP - General (Family Medicine)  Inpatient Treatment Team: Treatment Team: Attending Provider: Merwyn Katos, MD; Respiratory Therapist: Liz Beach, RRT; Consulting Physician: Md Montez Morita, MD; Rounding Team: Md Pccm, MD; Registered Nurse: Beatris Si, RN; Registered Nurse: Doreene Eland, RN; Registered Nurse: Gita Kudo Main, RN; Respiratory Therapist: Renold Genta, RRT   Results:   Labs: Results for orders placed during the hospital encounter of 10/29/12 (from the past 48 hour(s))  GLUCOSE, CAPILLARY     Status: Abnormal   Collection Time   11/08/12 11:16 AM      Component Value Range Comment   Glucose-Capillary 167 (*) 70 - 99 mg/dL   LACTIC  ACID, PLASMA     Status: Normal   Collection Time   11/08/12 12:20 PM      Component Value Range Comment   Lactic Acid, Venous 1.6  0.5 - 2.2 mmol/L   URINE CULTURE     Status: Normal   Collection Time   11/08/12 12:20 PM      Component Value Range Comment   Specimen Description URINE, CATHETERIZED      Special Requests NONE      Culture  Setup Time 11/08/2012 22:04      Colony Count NO GROWTH      Culture NO GROWTH      Report Status  11/09/2012 FINAL     PROCALCITONIN     Status: Normal   Collection Time   11/08/12 12:20 PM      Component Value Range Comment   Procalcitonin 2.86     GLUCOSE, CAPILLARY     Status: Abnormal   Collection Time   11/08/12  3:40 PM      Component Value Range Comment   Glucose-Capillary 155 (*) 70 - 99 mg/dL   GLUCOSE, CAPILLARY     Status: Abnormal   Collection Time   11/08/12  7:34 PM      Component Value Range Comment   Glucose-Capillary 132 (*) 70 - 99 mg/dL   GLUCOSE, CAPILLARY     Status: Abnormal   Collection Time   11/08/12 11:52 PM      Component Value Range Comment   Glucose-Capillary 101 (*) 70 - 99 mg/dL   GLUCOSE, CAPILLARY     Status: Abnormal   Collection Time   11/09/12  3:36 AM      Component Value Range Comment   Glucose-Capillary 119 (*) 70 - 99 mg/dL   CBC     Status: Abnormal   Collection Time   11/09/12  4:00 AM      Component Value Range Comment   WBC 17.3 (*) 4.0 - 10.5 K/uL    RBC 2.85 (*) 3.87 - 5.11 MIL/uL    Hemoglobin 8.9 (*) 12.0 - 15.0 g/dL    HCT 16.1 (*) 09.6 - 46.0 %    MCV 95.4  78.0 - 100.0 fL    MCH 31.2  26.0 - 34.0 pg    MCHC 32.7  30.0 - 36.0 g/dL    RDW 04.5  40.9 - 81.1 %    Platelets 223  150 - 400 K/uL   BASIC METABOLIC PANEL     Status: Abnormal   Collection Time   11/09/12  4:00 AM      Component Value Range Comment   Sodium 134 (*) 135 - 145 mEq/L    Potassium 3.9  3.5 - 5.1 mEq/L    Chloride 102  96 - 112 mEq/L    CO2 28  19 - 32 mEq/L    Glucose, Bld 130 (*) 70 - 99 mg/dL    BUN 7  6 - 23 mg/dL    Creatinine, Ser 9.14  0.50 - 1.10 mg/dL    Calcium 8.0 (*) 8.4 - 10.5 mg/dL    GFR calc non Af Amer 88 (*) >90 mL/min    GFR calc Af Amer >90  >90 mL/min   MAGNESIUM     Status: Abnormal   Collection Time   11/09/12  4:00 AM      Component Value Range Comment   Magnesium 1.3 (*) 1.5 - 2.5 mg/dL   PHOSPHORUS     Status: Abnormal  Collection Time   11/09/12  4:00 AM      Component Value Range Comment   Phosphorus 2.0 (*) 2.3 - 4.6  mg/dL   PROCALCITONIN     Status: Normal   Collection Time   11/09/12  4:00 AM      Component Value Range Comment   Procalcitonin 1.40     GLUCOSE, CAPILLARY     Status: Abnormal   Collection Time   11/09/12  7:44 AM      Component Value Range Comment   Glucose-Capillary 140 (*) 70 - 99 mg/dL   GLUCOSE, CAPILLARY     Status: Abnormal   Collection Time   11/09/12 11:35 AM      Component Value Range Comment   Glucose-Capillary 151 (*) 70 - 99 mg/dL   GLUCOSE, CAPILLARY     Status: Abnormal   Collection Time   11/09/12  4:07 PM      Component Value Range Comment   Glucose-Capillary 127 (*) 70 - 99 mg/dL    Comment 1 Documented in Chart      Comment 2 Notify RN     GLUCOSE, CAPILLARY     Status: Abnormal   Collection Time   11/09/12  8:09 PM      Component Value Range Comment   Glucose-Capillary 131 (*) 70 - 99 mg/dL    Comment 1 Documented in Chart      Comment 2 Notify RN     GLUCOSE, CAPILLARY     Status: Normal   Collection Time   11/10/12 12:16 AM      Component Value Range Comment   Glucose-Capillary 95  70 - 99 mg/dL    Comment 1 Documented in Chart      Comment 2 Notify RN     BASIC METABOLIC PANEL     Status: Abnormal   Collection Time   11/10/12  4:00 AM      Component Value Range Comment   Sodium 136  135 - 145 mEq/L    Potassium 3.9  3.5 - 5.1 mEq/L    Chloride 103  96 - 112 mEq/L    CO2 27  19 - 32 mEq/L    Glucose, Bld 113 (*) 70 - 99 mg/dL    BUN 6  6 - 23 mg/dL    Creatinine, Ser 1.61  0.50 - 1.10 mg/dL    Calcium 8.3 (*) 8.4 - 10.5 mg/dL    GFR calc non Af Amer >90  >90 mL/min    GFR calc Af Amer >90  >90 mL/min   CBC     Status: Abnormal   Collection Time   11/10/12  4:00 AM      Component Value Range Comment   WBC 14.8 (*) 4.0 - 10.5 K/uL    RBC 2.85 (*) 3.87 - 5.11 MIL/uL    Hemoglobin 9.1 (*) 12.0 - 15.0 g/dL    HCT 09.6 (*) 04.5 - 46.0 %    MCV 94.7  78.0 - 100.0 fL    MCH 31.9  26.0 - 34.0 pg    MCHC 33.7  30.0 - 36.0 g/dL    RDW 40.9  81.1 - 91.4 %      Platelets 264  150 - 400 K/uL   GLUCOSE, CAPILLARY     Status: Abnormal   Collection Time   11/10/12  4:03 AM      Component Value Range Comment   Glucose-Capillary 105 (*) 70 - 99 mg/dL  Comment 1 Documented in Chart      Comment 2 Notify RN     PROCALCITONIN     Status: Normal   Collection Time   11/10/12  4:09 AM      Component Value Range Comment   Procalcitonin 0.54       Imaging / Studies: Dg Chest Port 1 View  11/09/2012  *RADIOLOGY REPORT*  Clinical Data: Emphysema.  Post extubation.  PORTABLE CHEST - 1 VIEW  Comparison: 11/08/2012  Findings: Endotracheal tube has been removed.  Nasogastric tube tip in the stomach body.  Right IJ line tip:  SVC.  Emphysema noted.  Evolving indistinct density at the right lung base, possibly from atelectasis, early pneumonia, or small layering effusion. Heart size within normal limits.  Atherosclerotic calcification of the aortic arch.  IMPRESSION:  1.  Emphysema. 2.  Vague density at the right lung base, potentially representing a small layering right-sided pleural effusion, mild atelectasis, or early pneumonia.   Original Report Authenticated By: Gaylyn Rong, M.D.     Medications / Allergies: per chart  Antibiotics: Anti-infectives     Start     Dose/Rate Route Frequency Ordered Stop   11/09/12 0000   vancomycin (VANCOCIN) 750 mg in sodium chloride 0.9 % 150 mL IVPB  Status:  Discontinued        750 mg 150 mL/hr over 60 Minutes Intravenous Every 12 hours 11/08/12 1023 11/09/12 1152   11/08/12 1200  piperacillin-tazobactam (ZOSYN) IVPB 3.375 g       3.375 g 12.5 mL/hr over 240 Minutes Intravenous Every 8 hours 11/08/12 1023     11/08/12 1100   vancomycin (VANCOCIN) IVPB 1000 mg/200 mL premix        1,000 mg 200 mL/hr over 60 Minutes Intravenous  Once 11/08/12 1023 11/08/12 1322   11/04/12 1200   levofloxacin (LEVAQUIN) IVPB 500 mg  Status:  Discontinued        500 mg 100 mL/hr over 60 Minutes Intravenous Daily 11/04/12 1124  11/05/12 1436   11/01/12 1615   fluconazole (DIFLUCAN) tablet 200 mg        200 mg Oral  Once 11/01/12 1607 11/01/12 1636   11/01/12 1000   levofloxacin (LEVAQUIN) IVPB 750 mg  Status:  Discontinued        750 mg 100 mL/hr over 90 Minutes Intravenous Every 48 hours 10/30/12 1108 10/31/12 1428   11/01/12 1000   levofloxacin (LEVAQUIN) tablet 750 mg  Status:  Discontinued        750 mg Oral Every 48 hours 10/31/12 1428 11/04/12 1124   10/29/12 1045   levofloxacin (LEVAQUIN) IVPB 750 mg  Status:  Discontinued        750 mg 100 mL/hr over 90 Minutes Intravenous Every 24 hours 10/29/12 1034 10/30/12 1108

## 2012-11-11 ENCOUNTER — Inpatient Hospital Stay (HOSPITAL_COMMUNITY): Payer: Medicare Other

## 2012-11-11 DIAGNOSIS — J189 Pneumonia, unspecified organism: Secondary | ICD-10-CM

## 2012-11-11 LAB — BASIC METABOLIC PANEL
BUN: 4 mg/dL — ABNORMAL LOW (ref 6–23)
Calcium: 8.7 mg/dL (ref 8.4–10.5)
GFR calc non Af Amer: >90 mL/min (ref >90)
Glucose, Bld: 166 mg/dL — ABNORMAL HIGH (ref 70–99)

## 2012-11-11 MED ORDER — FAT EMULSION 20 % IV EMUL
250.0000 mL | INTRAVENOUS | Status: AC
  Administered 2012-11-11: 250 mL via INTRAVENOUS
  Filled 2012-11-11: qty 250

## 2012-11-11 MED ORDER — BUDESONIDE 0.25 MG/2ML IN SUSP
0.2500 mg | Freq: Four times a day (QID) | RESPIRATORY_TRACT | Status: DC
  Administered 2012-11-11 – 2012-11-29 (×66): 0.25 mg via RESPIRATORY_TRACT
  Filled 2012-11-11 (×81): qty 2

## 2012-11-11 MED ORDER — KCL IN DEXTROSE-NACL 20-5-0.45 MEQ/L-%-% IV SOLN
INTRAVENOUS | Status: DC
  Administered 2012-11-11: 16:00:00 via INTRAVENOUS
  Administered 2012-11-11: 1000 mL via INTRAVENOUS
  Administered 2012-11-12 – 2012-11-15 (×4): via INTRAVENOUS
  Administered 2012-11-16: 45 mL/h via INTRAVENOUS
  Administered 2012-11-17 (×2): via INTRAVENOUS
  Filled 2012-11-11 (×9): qty 1000

## 2012-11-11 MED ORDER — SODIUM PHOSPHATE 3 MMOLE/ML IV SOLN
20.0000 mmol | Freq: Once | INTRAVENOUS | Status: AC
  Administered 2012-11-11: 20 mmol via INTRAVENOUS
  Filled 2012-11-11: qty 6.67

## 2012-11-11 MED ORDER — PANTOPRAZOLE SODIUM 40 MG IV SOLR
40.0000 mg | Freq: Every day | INTRAVENOUS | Status: DC
  Administered 2012-11-11 – 2012-11-24 (×13): 40 mg via INTRAVENOUS
  Filled 2012-11-11 (×14): qty 40

## 2012-11-11 MED ORDER — TRACE MINERALS CR-CU-F-FE-I-MN-MO-SE-ZN IV SOLN
INTRAVENOUS | Status: AC
  Administered 2012-11-11: 18:00:00 via INTRAVENOUS
  Filled 2012-11-11: qty 1000

## 2012-11-11 MED ORDER — TIOTROPIUM BROMIDE MONOHYDRATE 18 MCG IN CAPS
18.0000 ug | ORAL_CAPSULE | Freq: Every day | RESPIRATORY_TRACT | Status: DC
  Administered 2012-11-11 – 2012-11-29 (×18): 18 ug via RESPIRATORY_TRACT
  Filled 2012-11-11 (×4): qty 5

## 2012-11-11 NOTE — Anesthesia Postprocedure Evaluation (Signed)
  Anesthesia Post-op Note  Patient: [name]  Procedure(s) Performed: Procedure(s) (LRB): LAPAROSCOPY DIAGNOSTIC (N/A) EXPLORATORY LAPAROTOMY (N/A) LAPAROSCOPIC LYSIS OF ADHESIONS (N/A) LYSIS OF ADHESION ()  Patient Location: ICU  Anesthesia Type: General  Level of Consciousness:sedated  Airway and Oxygen Therapy: mechanical ventilation  Post-op Pain: mild  Post-op Assessment: Post-op Vital signs reviewed, Patient's Cardiovascular Status Stable, Respiratory Function Stable, Patent Airway and No signs of Nausea or vomiting  Due to severe lung dz, patient has been taken to ICU intubated to undergo an aggressive weaning protocol  Last Vitals:  Filed Vitals:   11/11/12 0445  BP: 116/55  Pulse: 87  Temp: 36.7 C  Resp: 18    Post-op Vital Signs: stable   Complications: No apparent anesthesia complications

## 2012-11-11 NOTE — Progress Notes (Signed)
Pt profile: 71 yowf admitted by [facility] 12/24 with dx of aecopd and treated accordingly with improvement in respiratory symptoms. She developed progressive abdominal distention with KUB and CT abd c/w SBO. Underwent exploratory laparotomy, lysis of adhesions 1/02. Returned to ICU on vent. PCCM asked to assist with vent/ICU mgmt   Lines, Tubes, etc: ETT 1/02 >> 1/03 R IJ CVL 1/02 >>   Microbiology: MRSA PCR 1/02 >> NEG 1/3 BC x 2 >>  1/3 UC >> NEG  Antibiotics:  vanco (empiric, fever) 1/3 >> 1/04 Zosyn (empiric, fever) 1/3 >> 1/10 (stop date ordered)   Studies/Events: 12/31 CT abd: Small bowel obstruction with dilated small bowel to the level of the distal ileum in the region of the pelvis where there is abrupt change of caliber which may be related to adhesions 1/2 Hypotension post op >pressor support  1/3 low grade fever/elevated WBC >pan cx   Consults:  CCS 12/30   Best Practice: DVT: SQ hep SUP: PPI Nutrition: none Glycemic control: SSI Sedation/analgesia: PRN fentanyl   [name]: No new complaints or increased wob.  Being followed by surgery closely.   [name]: Filed Vitals:   11/11/12 0445  BP: 116/55  Pulse: 87  Temp: 98 F (36.7 C)  Resp: 18    Gen: NAD HEENT: WNL Neck: no JVD Chest: decreased bs, no wheezing, a few basilar crackles. Cardiac: RRR s M Abd: hypoactive Ext:no edema or cyanosis Neuro: a/o x 3 , moves all 4.   BMET    Component Value Date/Time   NA 136 11/10/2012 0400   K 3.9 11/10/2012 0400   CL 103 11/10/2012 0400   CO2 27 11/10/2012 0400   GLUCOSE 113* 11/10/2012 0400   BUN 6 11/10/2012 0400   CREATININE 0.59 11/10/2012 0400   CALCIUM 8.3* 11/10/2012 0400   GFRNONAA >90 11/10/2012 0400   GFRAA >90 11/10/2012 0400    CBC    Component Value Date/Time   WBC 14.8* 11/10/2012 0400   RBC 2.85* 11/10/2012 0400   HGB 9.1* 11/10/2012 0400   HCT 27.0* 11/10/2012 0400   PLT 264 11/10/2012 0400   MCV 94.7 11/10/2012 0400   MCH 31.9 11/10/2012 0400   MCHC 33.7 11/10/2012  0400   RDW 12.9 11/10/2012 0400   LYMPHSABS 0.7 08/15/2008 0550   MONOABS 0.2 08/15/2008 0550   EOSABS 0.0 08/15/2008 0550   BASOSABS 0.0 08/15/2008 0550    CXR: no new film   IMPRESSION:  1)Respiratory failure, post-operative, resolved  SBO (small bowel obstruction)      Status post laparotomy per [facility]CS   2)Emphysema, severe Pt is stable currently from pulmonary standpoint.   3)Leukocytosis, fever 1/03, vague RLL opacity 1/04, mildly elevated PCT HAP vs abdominal source (less likely).  Continue abx

## 2012-11-11 NOTE — Progress Notes (Signed)
Physical Therapy Treatment Patient Details Name: [name] MRN: [id number] DOB: [birth year] Today's Date: 11/11/2012 Time: 4098-1191 PT Time Calculation (min): 23 min  PT Assessment / Plan / Recommendation Comments on Treatment Session  Pt. states she plans to go home and will hire someone to stay with her. This does not appear reasonable based on requiring 2 persons for safety. realize pt is not medically ready either. Will continue PT and recommend post acute needs as pt improves.    Follow Up Recommendations  Home health PT (may benefit from SNF. ? if CIR is an option.)     Does the patient have the potential to tolerate intense rehabilitation     Barriers to Discharge        Equipment Recommendations   (rollator)    Recommendations for Other Services    Frequency Min 3X/week   Plan Discharge plan remains appropriate;Frequency remains appropriate    Precautions / Restrictions Precautions Precautions: Fall Precaution Comments: wears 2L O2 chronically, monitor O2 sats and now NG tube and multiple lines    Pertinent Vitals/Pain sats Pre 95%, during drop to 82% 2 l.  Hr increased to 130's. Placed pt on 3 l with sats drop to 88 % for secnd walk.    Mobility  Bed Mobility Supine to Sit: 4: Min assist;HOB flat Details for Bed Mobility Assistance: OPt. requested to have assistance pulling up to sitting. Transfers Sit to Stand: From bed;3: Mod assist;From chair/3-in-1;With upper extremity assist Stand to Sit: 4: Min assist;To chair/3-in-1 Details for Transfer Assistance: cues to reachbackto recliner and to push up from recliner. Pt. nearly slid out of chair as pt was sitting on edge. Ambulation/Gait Ambulation/Gait Assistance: 1: +2 Total assist Ambulation/Gait: Patient Percentage: 60% Ambulation Distance (Feet): 20 Feet (x2) Assistive device: [place] Ambulation/Gait Assistance Details: Pt limited by dyspnea. Pt has difficulty with sit to stand. knees nearly  buckle. Gait Pattern: Step-to pattern;Decreased stride length;Trunk flexed Gait velocity: decreased    Exercises     PT Diagnosis:    PT Problem List:   PT Treatment Interventions:     PT Goals Acute Rehab PT Goals Pt will go Supine/Side to Sit: with modified independence PT Goal: Supine/Side to Sit - Progress: Progressing toward goal Pt will go Sit to Stand: with modified independence PT Goal: Sit to Stand - Progress: Progressing toward goal Pt will go Stand to Sit: with modified independence PT Goal: Stand to Sit - Progress: Progressing toward goal Pt will Ambulate: 51 - 150 feet;with modified independence;with least restrictive assistive device PT Goal: Ambulate - Progress: Progressing toward goal  Visit Information  Last PT Received On: 11/11/12 Assistance Needed: +2 (safety to ambulate, equip and chair.)    Subjective Data  Subjective: Am I going to walk/   Cognition  Overall Cognitive Status: Appears within functional limits for tasks assessed/performed    Balance     End of Session PT - End of Session Equipment Utilized During Treatment: Gait belt Activity Tolerance:  (limited by dyspnea, sats drop to 82% 2 l, HR 130's) Patient left: in chair;with call bell/phone within reach Nurse Communication: Mobility status (sats and HR)   GP     Rada Hay 11/11/2012, 10:29 AM

## 2012-11-11 NOTE — Progress Notes (Signed)
Patient ID: [name], female   DOB: 1941/03/02, 72 y.o.   MRN: [id number] 4 Days Post-Op  Subjective: Pt with appropriate abd pain, controlled with meds, no flatus, some nausea but reports that her protonix has not been given since she was transferred out of ICU.  Getting up to chair and is scheduled to walk today.  Objective: Vital signs in last 24 hours: Temp:  [98 F (36.7 C)-98.8 F (37.1 C)] 98 F (36.7 C) (01/06 0445) Pulse Rate:  [87-124] 87  (01/06 0445) Resp:  [14-22] 18  (01/06 0445) BP: (116-120)/(55-99) 116/55 mmHg (01/06 0445) SpO2:  [97 %-100 %] 100 % (01/06 0445) Last BM Date: 11/07/12  Intake/Output from previous day: 01/05 0701 - 01/06 0700 In: 263.5 [P.O.:50; I.V.:200; IV Piggyback:13.5] Out: 1025 [Urine:625; Emesis/NG output:400] Intake/Output this shift:    PE: Abd: mildly distended, appropriately tender, no bowel sounds, staples in place General: awake, alert, NAD Ext: some mild peripheral edema Urine output still low  Lab Results:   Basename 11/10/12 0400 11/09/12 0400  WBC 14.8* 17.3*  HGB 9.1* 8.9*  HCT 27.0* 27.2*  PLT 264 223   BMET  Basename 11/10/12 0400 11/09/12 0400  NA 136 134*  K 3.9 3.9  CL 103 102  CO2 27 28  GLUCOSE 113* 130*  BUN 6 7  CREATININE 0.59 0.63  CALCIUM 8.3* 8.0*   PT/INR No results found for this basename: LABPROT:2,INR:2 in the last 72 hours CMP     Component Value Date/Time   NA 136 11/10/2012 0400   K 3.9 11/10/2012 0400   CL 103 11/10/2012 0400   CO2 27 11/10/2012 0400   GLUCOSE 113* 11/10/2012 0400   BUN 6 11/10/2012 0400   CREATININE 0.59 11/10/2012 0400   CALCIUM 8.3* 11/10/2012 0400   PROT 6.4 08/11/2008 0500   ALBUMIN 3.8 08/11/2008 0500   AST 46* 08/11/2008 0500   ALT 34 08/11/2008 0500   ALKPHOS 79 08/11/2008 0500   BILITOT 0.6 08/11/2008 0500   GFRNONAA >90 11/10/2012 0400   GFRAA >90 11/10/2012 0400   Lipase  No results found for this basename: lipase       Studies/Results: Dg Chest Port 1  View  11/11/2012  *RADIOLOGY REPORT*  Clinical Data: Respiratory failure.  Emphysema.  PORTABLE CHEST - 1 VIEW  Comparison: Chest 11/08/2012 and 11/09/2012.  Findings: Support tubes and lines are unchanged.  The lungs are emphysematous.  Right basilar airspace disease continues to improve.  No pneumothorax is identified.  IMPRESSION: Continued improvement in right basilar airspace disease.  No new abnormality.   Original Report Authenticated By: Holley Dexter, M.D.     Anti-infectives: Anti-infectives     Start     Dose/Rate Route Frequency Ordered Stop   11/09/12 0000   vancomycin (VANCOCIN) 750 mg in sodium chloride 0.9 % 150 mL IVPB  Status:  Discontinued        750 mg 150 mL/hr over 60 Minutes Intravenous Every 12 hours 11/08/12 1023 11/09/12 1152   11/08/12 1200  piperacillin-tazobactam (ZOSYN) IVPB 3.375 g       3.375 g 12.5 mL/hr over 240 Minutes Intravenous Every 8 hours 11/08/12 1023 11/15/12 1159   11/08/12 1100   vancomycin (VANCOCIN) IVPB 1000 mg/200 mL premix        1,000 mg 200 mL/hr over 60 Minutes Intravenous  Once 11/08/12 1023 11/08/12 1322   11/04/12 1200   levofloxacin (LEVAQUIN) IVPB 500 mg  Status:  Discontinued  500 mg 100 mL/hr over 60 Minutes Intravenous Daily 11/04/12 1124 11/05/12 1436   11/01/12 1615   fluconazole (DIFLUCAN) tablet 200 mg        200 mg Oral  Once 11/01/12 1607 11/01/12 1636   11/01/12 1000   levofloxacin (LEVAQUIN) IVPB 750 mg  Status:  Discontinued        750 mg 100 mL/hr over 90 Minutes Intravenous Every 48 hours 10/30/12 1108 10/31/12 1428   11/01/12 1000   levofloxacin (LEVAQUIN) tablet 750 mg  Status:  Discontinued        750 mg Oral Every 48 hours 10/31/12 1428 11/04/12 1124   10/29/12 1045   levofloxacin (LEVAQUIN) IVPB 750 mg  Status:  Discontinued        750 mg 100 mL/hr over 90 Minutes Intravenous Every 24 hours 10/29/12 1034 10/30/12 1108           Assessment/Plan 1. POD#4-Exp lap with LOA: post-op ileus, NGT  output bilious, urine output low (only on 50cc/hr) some peripheral edema so will increase IVF rate.    --restart protonix  --increase IVF to 100cc/hr but watch for fluid overload  --keep NGT  --OOB and ambulate in hall  --consider TNA if ileus persist another 24-48 hrs.  LOS: 13 days    Miranda, Jessica 11/11/2012 Pt seen and examined.  She is doing okay but still not much bowel function.  Check xray and follow clinically for resolution of postop ileus.  I think that TPN would be appropriate at this point.

## 2012-11-11 NOTE — Progress Notes (Signed)
NUTRITION FOLLOW UP  Intervention:   - TPN per pharmacy, recommend slow rate related to pt at high risk of refeeding syndrome - recommend close monitoring of potassium, phosphorus, and magnesium - Diet advancement per MD - RD to continue to monitor   Nutrition Dx:   Inadequate oral intake r/t shortness of breath as evidenced by pt consuming 2 small meals/day - now changed to r/t inability to eat as evidenced by pt NPO.   Goal:   1. Intake >50% of meals - not met, pt NPO 2. Nutrition support, initiation if appropriate - met, pt to start TPN tonight  New goal: TPN to meet >90% of estimated nutritional needs  Monitor:   Weights, labs, diet advancement, BM, TPN  Assessment:   Pt extubated 1/3. POD# 4 exploratory laparotomy with lysis of adhesions. Pt found to have post-op ileus, MD ordered to start TPN tonight. Pt with NGT in place, total output yesterday. Met with pt who denied any complaints. Pt has been NPO since admission (13 days). Discussed pt's refeeding risk with pharmacist. Noted pt with low phosphorus and magnesium yesterday - pt getting potassium in IV fluids, did not see phosphorus replacement ordered. Noted pt's weight up 20 pounds since admission, likely related to non-pitting edema throughout body.   Height: Ht Readings from Last 1 Encounters:  11/07/12 5\' 5"  (1.651 m)    Weight Status:   Wt Readings from Last 1 Encounters:  11/10/12 114 lb 6.7 oz (51.9 kg)    Re-estimated needs:  Kcal: 1500-1750 Protein: 70-90g Fluid: 1.5-1.7L  Skin: Non-pitting RUE, LUE, RLE, and LLE edema  Diet Order: NPO   Intake/Output Summary (Last 24 hours) at 11/11/12 1013 Last data filed at 11/11/12 6213  Gross per 24 hour  Intake  113.5 ml  Output    750 ml  Net -636.5 ml    Last BM: 1/2   Labs:   Lab 11/10/12 0400 11/09/12 0400 11/08/12 0415 11/07/12 0433 11/06/12 0505  NA 136 134* 131* -- --  K 3.9 3.9 4.4 -- --  CL 103 102 99 -- --  CO2 27 28 26  -- --  BUN 6  7 12  -- --  CREATININE 0.59 0.63 0.69 -- --  CALCIUM 8.3* 8.0* 8.0* -- --  MG -- 1.3* -- 1.8 --  PHOS -- 2.0* -- -- 3.2  GLUCOSE 113* 130* 238* -- --    CBG (last 3)   Basename 11/10/12 1117 11/10/12 0812 11/10/12 0403  GLUCAP 110* 123* 105*    Scheduled Meds:   . albuterol  2.5 mg Nebulization Q6H  . antiseptic oral rinse  15 mL Mouth Rinse QID  . budesonide  0.25 mg Nebulization QID  . chlorhexidine  15 mL Mouth Rinse BID  . heparin  5,000 Units Subcutaneous Q8H  . lip balm  1 application Topical BID  . metoCLOPramide (REGLAN) injection  5 mg Intravenous Q6H  . pantoprazole (PROTONIX) IV  40 mg Intravenous Daily  . piperacillin-tazobactam (ZOSYN)  IV  3.375 g Intravenous Q8H  . tiotropium  18 mcg Inhalation Daily    Continuous Infusions:   . dextrose 5 % and 0.45 % NaCl with KCl 20 mEq/L 50 mL/hr (11/11/12 0413)    Levon Hedger MS, RD, LDN [phone] Pager [phone] After Hours Pager

## 2012-11-11 NOTE — Progress Notes (Addendum)
PARENTERAL NUTRITION CONSULT NOTE - INITIAL  Pharmacy Consult for TNA Indication: SBO  Allergies  Allergen Reactions  . Oxycodone-Acetaminophen     REACTION: headaches,nausea   Patient Measurements: Height: 5\' 5"  (165.1 cm) Weight: 114 lb 6.7 oz (51.9 kg) IBW/kg (Calculated) : 57  Usual Weight: 43 kg TBW increased likely due to fluid retention  Vital Signs: Temp: 98 F (36.7 C) (01/06 0445) Temp src: Oral (01/06 0445) BP: 116/55 mmHg (01/06 0445) Pulse Rate: 87  (01/06 0445) Intake/Output from previous day: 01/05 0701 - 01/06 0700 In: 263.5 [P.O.:50; I.V.:200; IV Piggyback:13.5] Out: 1025 [Urine:625; Emesis/NG output:400] Intake/Output from this shift: Total I/O In: 0  Out: 150 [Urine:150]  Labs:  Basename 11/10/12 0400 11/09/12 0400  WBC 14.8* 17.3*  HGB 9.1* 8.9*  HCT 27.0* 27.2*  PLT 264 223  APTT -- --  INR -- --    Basename 11/10/12 0400 11/09/12 0400  NA 136 134*  K 3.9 3.9  CL 103 102  CO2 27 28  GLUCOSE 113* 130*  BUN 6 7  CREATININE 0.59 0.63  LABCREA -- --  CREAT24HRUR -- --  CALCIUM 8.3* 8.0*  MG -- 1.3*  PHOS -- 2.0*  PROT -- --  ALBUMIN -- --  AST -- --  ALT -- --  ALKPHOS -- --  BILITOT -- --  BILIDIR -- --  IBILI -- --  PREALBUMIN -- --  TRIG -- --  CHOLHDL -- --  CHOL -- --   Estimated Creatinine Clearance: 52.8 ml/min (by C-G formula based on Cr of 0.59).   Basename 11/10/12 1117 11/10/12 0812 11/10/12 0403  GLUCAP 110* 123* 105*   Medical History: Past Medical History  Diagnosis Date  . Emphysema   . 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 Hyperlipidemia   . Hypertension    Medications:  Scheduled:    . albuterol  2.5 mg Nebulization Q6H  . antiseptic oral rinse  15 mL Mouth Rinse QID  . budesonide  0.25 mg Nebulization QID  . chlorhexidine  15 mL Mouth Rinse BID  . heparin  5,000 Units Subcutaneous Q8H  . lip balm  1 application Topical BID  . metoCLOPramide (REGLAN) injection  5 mg Intravenous Q6H  . pantoprazole  (PROTONIX) IV  40 mg Intravenous Daily  . piperacillin-tazobactam (ZOSYN)  IV  3.375 g Intravenous Q8H  . tiotropium  18 mcg Inhalation Daily  . [DISCONTINUED] budesonide  0.25 mg Nebulization BID  . [DISCONTINUED] ipratropium  0.5 mg Nebulization Q6H   Infusions:    . dextrose 5 % and 0.45 % NaCl with KCl 20 mEq/L 50 mL/hr (11/11/12 0413)   Insulin Requirements in the past 24 hours:  None, no hx of DM, CBG 95-110 No CBG or insulin warranted yet, monitor serum glucose.  Current Nutrition:  NPO x 13 days, POD #4 exploratory lab with lysis of adhesions (previous hysterectomy)  Assessment: 71 yoF admit 12/24 with COPD exacerbation. Abdominal distention, surgery 1/2 for SBO.  Fluid retention, likely candidate for refeeding syndrome  Central access from 1/2, triple lumen catheter  Nutritional Goals: per RD 1500-1750 kCal, 70-90 grams of protein per day Clinimix E 5/20 at goal rate of 70 ml/hr would deliver 84 gm protein, avg 1680 kcal/day.  Plan:  BMET, Magnesium, Phosphorus level now to assess appropriateness of beginning TNA today. Replete Mag/Phos as needed.  Plan to begin TNA, use E 5/20 formula at 55ml/hr  TNA lab panel Mon/Thurs  Lipids, MVI and trace elements MWF only due  to national back order.  CMET, Mag, Phos levels in am.  Reduce IV fluid to 60 ml/hr when TNA begins  Monitor lytes closely, anticipate refeeding syndrome.  Otho Bellows PharmD Pager [phone] 11/11/2012,10:35 AM  Addendum 2:16 PM Magnesium level 1.6 Phosphorus level 2.1  Will replete with Na Phos 20 mmol bolus today.  Otho Bellows PharmD

## 2012-11-11 NOTE — Evaluation (Signed)
Occupational Therapy Evaluation Patient Details Name: [name] MRN: [id number] DOB: [birth year] Today's Date: 11/11/2012 Time: 2130-8657 OT Time Calculation (min): 27 min  OT Assessment / Plan / Recommendation Clinical Impression  Pt underwent exploratory laparotomy with lysis of adhesions on 11/07/12 and is now seen for OT re-evaluation. Pt extremely unsteady and unsafe with mobility and has significant difficulty with ADLs 2* abdominal pain. Skilled OT indicated to maximize ADL indpendence to setup/min A levels in prep for d/c to next venue.    OT Assessment  Patient needs continued OT Services    Follow Up Recommendations  SNF;Supervision/Assistance - 24 hour    Barriers to Discharge Decreased caregiver support    Equipment Recommendations  3 in 1 bedside comode    Recommendations for Other Services    Frequency  Min 2X/week    Precautions / Restrictions Precautions Precautions: Fall Precaution Comments: wears 2L O2 chronically, monitor O2 sats and now NG tube and multiple lines    Pertinent Vitals/Pain Pt reported abdominal pain that she did not rate. Repositioned for comfort    ADL  Grooming: Wash/dry face;Brushing hair Where Assessed - Grooming: Supported standing Upper Body Bathing: Minimal assistance Where Assessed - Upper Body Bathing: Unsupported sitting Lower Body Bathing: Moderate assistance Where Assessed - Lower Body Bathing: Supported sit to stand Upper Body Dressing: Minimal assistance Where Assessed - Upper Body Dressing: Unsupported sitting Lower Body Dressing: Maximal assistance Where Assessed - Lower Body Dressing: Supported sit to stand Toilet Transfer: Minimal assistance Toilet Transfer Method: Sit to Barista: Bedside commode Toileting - Clothing Manipulation and Hygiene: Moderate assistance Where Assessed - Toileting Clothing Manipulation and Hygiene: Sit to stand from 3-in-1 or toilet Transfers/Ambulation Related to  ADLs: Pt ambulated in the hallway. Demo's poor safety awareness when sitting and standing at one point almost sliding to the ground from sitting too close to the edge of the chair. ADL Comments: Pt required A to bathe front and back peri area when standing at the sink. Fatigues very quickly on 2 L of O2.    OT Diagnosis: Generalized weakness  OT Problem List: Decreased strength;Decreased safety awareness;Decreased activity tolerance;Decreased knowledge of use of DME or AE;Pain OT Treatment Interventions: Self-care/ADL training;Therapeutic activities;DME and/or AE instruction;Patient/family education;Energy conservation;Balance training   OT Goals Acute Rehab OT Goals OT Goal Formulation: With patient Time For Goal Achievement: 11/25/12 Potential to Achieve Goals: Good ADL Goals Pt Will Perform Grooming: with supervision;Standing at sink ADL Goal: Grooming - Progress: Goal set today Pt Will Transfer to Toilet: with supervision;with DME;Ambulation ADL Goal: Toilet Transfer - Progress: Goal set today Pt Will Perform Toileting - Clothing Manipulation: with supervision;Sitting on 3-in-1 or toilet;Standing ADL Goal: Toileting - Clothing Manipulation - Progress: Goal set today Pt Will Perform Toileting - Hygiene: with supervision;Sit to stand from 3-in-1/toilet ADL Goal: Toileting - Hygiene - Progress: Goal set today Additional ADL Goal #1: Pt will complete all bathing/dressing tasks at setup/min A level taking prn RBs without prompting. ADL Goal: Additional Goal #1 - Progress: Goal set today  Visit Information  Last OT Received On: 11/11/12 Assistance Needed: +2 (safety to ambulate, equip and chair.) PT/OT Co-Evaluation/Treatment: Yes    Subjective Data  Subjective: Just give me a minute.... Patient Stated Goal: Return home with caregivers.   Prior Functioning     Home Living Lives With: Alone Available Help at Discharge: Friend(s) Type of Home: House Home Access: Stairs to  enter [employer] of Steps: 1 Home Layout: One level Bathroom  Shower/Tub: Network engineer: None Prior Function Level of Independence: Independent Driving: Yes Vocation: Retired Comments: pt reports she did not use an assistive device prior to admission Communication Communication: No difficulties Dominant Hand: Right         Vision/Perception     Cognition  Overall Cognitive Status: Appears within functional limits for tasks assessed/performed Arousal/Alertness: Awake/alert Orientation Level: Appears intact for tasks assessed Behavior During Session: [facility] for tasks performed    Extremity/Trunk Assessment Right Upper Extremity Assessment RUE ROM/Strength/Tone: [facility] for tasks assessed Left Upper Extremity Assessment LUE ROM/Strength/Tone: WFL for tasks assessed     Mobility Bed Mobility Supine to Sit: 4: Min assist;HOB flat Details for Bed Mobility Assistance: OPt. requested to have assistance pulling up to sitting. Transfers Sit to Stand: From bed;3: Mod assist;From chair/3-in-1;With upper extremity assist Stand to Sit: 4: Min assist;To chair/3-in-1 Details for Transfer Assistance: cues to reachbackto recliner and to push up from recliner. Pt. nearly slid out of chair as pt was sitting on edge.     Shoulder Instructions     Exercise     Balance     End of Session OT - End of Session Activity Tolerance: Patient limited by fatigue Patient left: in chair;with call bell/phone within reach  GO     Britta Louth A OTR/L [phone] 11/11/2012, 11:39 AM

## 2012-11-12 ENCOUNTER — Encounter (HOSPITAL_COMMUNITY): Payer: Medicare Other

## 2012-11-12 LAB — CBC
Hemoglobin: 8.2 g/dL — ABNORMAL LOW (ref 12.0–15.0)
MCH: 32.2 pg (ref 26.0–34.0)
MCHC: 34 g/dL (ref 30.0–36.0)
Platelets: 284 10*3/uL (ref 150–400)
RDW: 12.9 % (ref 11.5–15.5)

## 2012-11-12 LAB — COMPREHENSIVE METABOLIC PANEL
ALT: 19 U/L (ref 0–35)
CO2: 34 mEq/L — ABNORMAL HIGH (ref 19–32)
Calcium: 8.5 mg/dL (ref 8.4–10.5)
Chloride: 99 mEq/L (ref 96–112)
Creatinine, Ser: 0.5 mg/dL (ref 0.50–1.10)
GFR calc Af Amer: >90 mL/min (ref >90)
GFR calc non Af Amer: >90 mL/min (ref >90)
Glucose, Bld: 226 mg/dL — ABNORMAL HIGH (ref 70–99)
Sodium: 137 mEq/L (ref 135–145)
Total Bilirubin: 0.3 mg/dL (ref 0.3–1.2)

## 2012-11-12 LAB — GLUCOSE, CAPILLARY
Glucose-Capillary: 190 mg/dL — ABNORMAL HIGH (ref 70–99)
Glucose-Capillary: 184 mg/dL — ABNORMAL HIGH (ref 70–99)

## 2012-11-12 LAB — DIFFERENTIAL
Basophils Absolute: 0 10*3/uL (ref 0.0–0.1)
Basophils Relative: 0 % (ref 0–1)
Eosinophils Absolute: 0.2 10*3/uL (ref 0.0–0.7)
Neutro Abs: 7.1 10*3/uL (ref 1.7–7.7)
Neutrophils Relative %: 76 % (ref 43–77)

## 2012-11-12 LAB — MAGNESIUM: Magnesium: 1.6 mg/dL (ref 1.5–2.5)

## 2012-11-12 LAB — CHOLESTEROL, TOTAL: Cholesterol: 108 mg/dL (ref 0–200)

## 2012-11-12 LAB — PHOSPHORUS: Phosphorus: 3 mg/dL (ref 2.3–4.6)

## 2012-11-12 LAB — TRIGLYCERIDES: Triglycerides: 129 mg/dL (ref <150)

## 2012-11-12 MED ORDER — POTASSIUM CHLORIDE 10 MEQ/100ML IV SOLN
10.0000 meq | INTRAVENOUS | Status: AC
  Administered 2012-11-12 (×4): 10 meq via INTRAVENOUS
  Filled 2012-11-12 (×4): qty 100

## 2012-11-12 MED ORDER — INSULIN ASPART 100 UNIT/ML ~~LOC~~ SOLN
0.0000 [IU] | Freq: Three times a day (TID) | SUBCUTANEOUS | Status: DC
  Administered 2012-11-12 – 2012-11-14 (×6): 2 [IU] via SUBCUTANEOUS

## 2012-11-12 MED ORDER — CLINIMIX E/DEXTROSE (5/20) 5 % IV SOLN
INTRAVENOUS | Status: AC
  Administered 2012-11-12: 18:00:00 via INTRAVENOUS
  Filled 2012-11-12: qty 1000

## 2012-11-12 NOTE — Progress Notes (Addendum)
PARENTERAL NUTRITION CONSULT NOTE - Follow-up  Pharmacy Consult for TNA Indication: SBO  Allergies  Allergen Reactions  . Oxycodone-Acetaminophen     REACTION: headaches,nausea   Patient Measurements: Height: 5\' 5"  (165.1 cm) Weight: 114 lb 6.7 oz (51.9 kg) IBW/kg (Calculated) : 57  Usual Weight: 43 kg TBW increased likely due to fluid retention  Vital Signs: Temp: 97.6 F (36.4 C) (01/07 0501) Temp src: Oral (01/07 0501) BP: 116/65 mmHg (01/07 0501) Pulse Rate: 85  (01/07 0501) Intake/Output from previous day: 01/06 0701 - 01/07 0700 In: 2011.7 [I.V.:786; IV Piggyback:600.4; TPN:625.3] Out: 2125 [Urine:1450; Emesis/NG output:675] Intake/Output from this shift:    Labs:  Basename 11/12/12 0650 11/10/12 0400  WBC 9.3 14.8*  HGB 8.2* 9.1*  HCT 24.1* 27.0*  PLT 284 264  APTT -- --  INR -- --    Basename 11/12/12 0650 11/11/12 1300 11/10/12 0400  NA 137 136 136  K 3.3* 3.6 3.9  CL 99 101 103  CO2 34* 30 27  GLUCOSE 226* 166* 113*  BUN 5* 4* 6  CREATININE 0.50 0.54 0.59  LABCREA -- -- --  CREAT24HRUR -- -- --  CALCIUM 8.5 8.7 8.3*  MG 1.6 1.6 --  PHOS 3.0 2.1* --  PROT 4.5* -- --  ALBUMIN 1.7* -- --  AST 12 -- --  ALT 19 -- --  ALKPHOS 52 -- --  BILITOT 0.3 -- --  BILIDIR -- -- --  IBILI -- -- --  PREALBUMIN -- -- --  TRIG -- -- --  CHOLHDL -- -- --  CHOL -- -- --   Estimated Creatinine Clearance: 52.8 ml/min (by C-G formula based on Cr of 0.5).   Basename 11/10/12 1117 11/10/12 0812 11/10/12 0403  GLUCAP 110* 123* 105*   Medical History: Past Medical History  Diagnosis Date  . Emphysema   . 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 Hyperlipidemia   . Hypertension    Medications:  Scheduled:     . albuterol  2.5 mg Nebulization Q6H  . antiseptic oral rinse  15 mL Mouth Rinse QID  . budesonide  0.25 mg Nebulization QID  . chlorhexidine  15 mL Mouth Rinse BID  . heparin  5,000 Units Subcutaneous Q8H  . lip balm  1 application Topical BID  .  metoCLOPramide (REGLAN) injection  5 mg Intravenous Q6H  . pantoprazole (PROTONIX) IV  40 mg Intravenous Daily  . piperacillin-tazobactam (ZOSYN)  IV  3.375 g Intravenous Q8H  . [COMPLETED] sodium phosphate  Dextrose 5% IVPB  20 mmol Intravenous Once  . tiotropium  18 mcg Inhalation Daily  . [DISCONTINUED] budesonide  0.25 mg Nebulization BID  . [DISCONTINUED] ipratropium  0.5 mg Nebulization Q6H   Infusions:     . dextrose 5 % and 0.45 % NaCl with KCl 20 mEq/L 1,000 mL (11/11/12 2226)  . TPN (CLINIMIX) +/- additives 40 mL/hr at 11/11/12 1749   And  . fat emulsion 250 mL (11/11/12 1749)  . [DISCONTINUED] dextrose 5 % and 0.45 % NaCl with KCl 20 mEq/L 100 mL/hr at 11/11/12 1000   Insulin Requirements in the past 24 hours:  None, no hx of DM, CBGs 95-110 prior to TNA  No CBG monitoring yesterday 11/11/12 - Serum glucose today 11/12/12 = 226  Current Nutrition:  NPO x 14 days, POD #5 exploratory lap with lysis of adhesions (previous hysterectomy) TNA @ 40 ml/hr Lipids @ 10 ml/hr MWF ONLY  Fluids: MIVF: D5 1/2 NS with 20 mEQ K+ @  60 ml/hr  Assessment: 71 yoF admit 12/24 with COPD exacerbation. Abdominal distention, surgery 1/2 for SBO.  Fluid retention, likely candidate for refeeding syndrome  Central access from 1/2, triple lumen catheter  Electrolytes: K+ low 3.3, Mg and Phos WNL, all other lytes WNL  LFTs: WNL  Prealbumin: pending  TGs: pending  Nutritional Goals: per RD 1500-1750 kCal, 70-90 grams of protein per day Clinimix E 5/20 at goal rate of 70 ml/hr would deliver 84 gm protein, avg 1680 kcal/day.  Plan:   Continue TNA @ 40 ml/hr, will not advance until able to assess CBGs on TNA  TNA lab panel Mon/Thurs  Lipids, MVI and trace elements MWF only due to national back order.  Continue mIVF at current rate of 60 ml/hr  Replace potassium, 10 mEq of K+ x 4 IV every 1 hour  BMET tomorrow AM  Start CBGs every 8 hours, sensitive SSI every 8 hours  Monitor  lytes closely, anticipate refeeding syndrome.  Tammy Sours, Pharm.D. Clinical Oncology Pharmacist  Pager # [phone]  11/12/2012,8:08 AM

## 2012-11-12 NOTE — Progress Notes (Signed)
[id number] Jessica Plater, RN, BSN, CCM: CHART REVIEWED AND UPDATED.  Next chart review due on [id number]. NO DISCHARGE NEEDS PRESENT AT THIS TIME.  Pa;tient pod 5-ileus per xray, npo,iv flds CASE MANAGEMENT [id number]

## 2012-11-12 NOTE — Progress Notes (Signed)
ANTIBIOTIC CONSULT NOTE - Follow-Up  Pharmacy Consult for Zosyn Indication: r/o sepsis, HAP  Allergies  Allergen Reactions  . Oxycodone-Acetaminophen     REACTION: headaches,nausea    Patient Measurements: Height: 5\' 5"  (165.1 cm) Weight: 114 lb 6.7 oz (51.9 kg) IBW/kg (Calculated) : 57   Vital Signs: Temp: 97.6 F (36.4 C) (01/07 0501) Temp src: Oral (01/07 0501) BP: 116/65 mmHg (01/07 0501) Pulse Rate: 85  (01/07 0501) Intake/Output from previous day: 01/06 0701 - 01/07 0700 In: 2011.7 [I.V.:786; IV Piggyback:600.4; TPN:625.3] Out: 2425 [Urine:1750; Emesis/NG output:675] Intake/Output from this shift:    Labs:  Basename 11/12/12 0650 11/11/12 1300 11/10/12 0400  WBC 9.3 -- 14.8*  HGB 8.2* -- 9.1*  PLT 284 -- 264  LABCREA -- -- --  CREATININE 0.50 0.54 0.59   Estimated Creatinine Clearance: 52.8 ml/min (by C-G formula based on Cr of 0.5). No results found for this basename: VANCOTROUGH:2,VANCOPEAK:2,VANCORANDOM:2,GENTTROUGH:2,GENTPEAK:2,GENTRANDOM:2,TOBRATROUGH:2,TOBRAPEAK:2,TOBRARND:2,AMIKACINPEAK:2,AMIKACINTROU:2,AMIKACIN:2, in the last 72 hours   Assessment:  62 yof admitted 12/24, treated for AECOPD.  Pt developed SBO - underwent ex lap 1/2 and returned to the ICU on vent.  CCM started Vanc and Zosyn on 11/09/11 empirically to r/o sepsis.  Plan extubated on 11/08/12.  Afebrile, WBC 9.3  Renal function stable  Currently Day 5 of Zosyn    12/24 >> Levaquin >> 12/31 1/3 >> Vanc >> 1/4 1/3 >> Zosyn >> with ordered stop date of 1/10   Plan:   Continue Zosyn 3.375 gm IV q8h over 4 hours  Stop date ordered as 1/10 per MD  Tammy Sours, Pharm.D. Clinical Oncology Pharmacist  Pager # [phone]  Pharmacy #: [phone] 8:47 AM 11/12/2012

## 2012-11-12 NOTE — Progress Notes (Signed)
5 Days Post-Op  Subjective: Still no flatus or BM, minimal discomfort  Objective: Vital signs in last 24 hours: Temp:  [97.6 F (36.4 C)-98.6 F (37 C)] 97.6 F (36.4 C) (01/07 0501) Pulse Rate:  [85-100] 85  (01/07 0501) Resp:  [18] 18  (01/07 0501) BP: (116-149)/(65-75) 116/65 mmHg (01/07 0501) SpO2:  [98 %-100 %] 100 % (01/07 0754) Last BM Date: 10/25/12  Intake/Output from previous day: 01/06 0701 - 01/07 0700 In: 2011.7 [I.V.:786; IV Piggyback:600.4; TPN:625.3] Out: 2625 [Urine:1950; Emesis/NG output:675] Intake/Output this shift:    General appearance: alert, cooperative and no distress Resp: clear to auscultation bilaterally Cardio: normal rate, regular GI: soft, NT, ND, incision without infection, no peritoneal signs, very hypoactive to absent bowel sounds.  Lab Results:   Basename 11/12/12 0650 11/10/12 0400  WBC 9.3 14.8*  HGB 8.2* 9.1*  HCT 24.1* 27.0*  PLT 284 264   BMET  Basename 11/12/12 0650 11/11/12 1300  NA 137 136  K 3.3* 3.6  CL 99 101  CO2 34* 30  GLUCOSE 226* 166*  BUN 5* 4*  CREATININE 0.50 0.54  CALCIUM 8.5 8.7   PT/INR No results found for this basename: LABPROT:2,INR:2 in the last 72 hours ABG No results found for this basename: PHART:2,PCO2:2,PO2:2,HCO3:2 in the last 72 hours  Studies/Results: Dg Chest Port 1 View  11/11/2012  *RADIOLOGY REPORT*  Clinical Data: Respiratory failure.  Emphysema.  PORTABLE CHEST - 1 VIEW  Comparison: Chest 11/08/2012 and 11/09/2012.  Findings: Support tubes and lines are unchanged.  The lungs are emphysematous.  Right basilar airspace disease continues to improve.  No pneumothorax is identified.  IMPRESSION: Continued improvement in right basilar airspace disease.  No new abnormality.   Original Report Authenticated By: Holley Dexter, M.D.    Dg Abd 2 Views  11/11/2012  *RADIOLOGY REPORT*  Clinical Data: Postoperative radiograph.  Lysis of adhesions. Bowel obstruction.  Persistent elevation of  nasogastric tube output.  ABDOMEN - 2 VIEW  Comparison: 11/06/2012.  Findings: Nasogastric tube remains present in the proximal stomach. Proximal side port appears to be at the gastroesophageal junction. Fluid levels are present within large and small bowel, most compatible with postoperative ileus.  No plain film evidence of free air.  Bowel gas extends along the left flank, in the descending colon.  Midline skin staples are present.  Compared to the prior exam of 11/06/2012, the bowel gas pattern is Normalizing.  IMPRESSION: Scattered air fluid levels within large and small bowel and mild gaseous distention, likely representing postoperative ileus. Nasogastric tube in the proximal stomach.   Original Report Authenticated By: Andreas Newport, M.D.     Anti-infectives: Anti-infectives     Start     Dose/Rate Route Frequency Ordered Stop   11/09/12 0000   vancomycin (VANCOCIN) 750 mg in sodium chloride 0.9 % 150 mL IVPB  Status:  Discontinued        750 mg 150 mL/hr over 60 Minutes Intravenous Every 12 hours 11/08/12 1023 11/09/12 1152   11/08/12 1200   piperacillin-tazobactam (ZOSYN) IVPB 3.375 g        3.375 g 12.5 mL/hr over 240 Minutes Intravenous Every 8 hours 11/08/12 1023 11/15/12 1159   11/08/12 1100   vancomycin (VANCOCIN) IVPB 1000 mg/200 mL premix        1,000 mg 200 mL/hr over 60 Minutes Intravenous  Once 11/08/12 1023 11/08/12 1322   11/04/12 1200   levofloxacin (LEVAQUIN) IVPB 500 mg  Status:  Discontinued  500 mg 100 mL/hr over 60 Minutes Intravenous Daily 11/04/12 1124 11/05/12 1436   11/01/12 1615   fluconazole (DIFLUCAN) tablet 200 mg        200 mg Oral  Once 11/01/12 1607 11/01/12 1636   11/01/12 1000   levofloxacin (LEVAQUIN) IVPB 750 mg  Status:  Discontinued        750 mg 100 mL/hr over 90 Minutes Intravenous Every 48 hours 10/30/12 1108 10/31/12 1428   11/01/12 1000   levofloxacin (LEVAQUIN) tablet 750 mg  Status:  Discontinued        750 mg Oral Every 48  hours 10/31/12 1428 11/04/12 1124   10/29/12 1045   levofloxacin (LEVAQUIN) IVPB 750 mg  Status:  Discontinued        750 mg 100 mL/hr over 90 Minutes Intravenous Every 24 hours 10/29/12 1034 10/30/12 1108          Assessment/Plan: s/p Procedure(s) (LRB) with comments: LAPAROSCOPY DIAGNOSTIC (N/A) EXPLORATORY LAPAROTOMY (N/A) LAPAROSCOPIC LYSIS OF ADHESIONS (N/A) LYSIS OF ADHESION () likely postop ileus.  NG advanced.  will continue NG today and check xrays tomorrow.  consider clamping tomorrow if any sign of bowel function.  LOS: 14 days    [name] 11/12/2012

## 2012-11-12 NOTE — Progress Notes (Addendum)
Clinical Social Work Department CLINICAL SOCIAL WORK PLACEMENT NOTE 11/12/2012  Patient:  DARCEL, ZICK  Account Number:  [id number] Admit date:  10/29/2012  Clinical Social Worker:  Jacelyn Grip  Date/time:  11/12/2012 03:00 PM  Clinical Social Work is seeking post-discharge placement for this patient at the following level of care:   SKILLED NURSING   (*CSW will update this form in Epic as items are completed)   11/12/2012  Patient/family provided with [facility] Department of Clinical Social Work's list of facilities offering this level of care within the geographic area requested by the patient (or if unable, by the patient's family).  11/12/2012  Patient/family informed of their freedom to choose among providers that offer the needed level of care, that participate in Medicare, Medicaid or managed care program needed by the patient, have an available bed and are willing to accept the patient.  11/12/2012  Patient/family informed of [facility]' ownership interest in [facility], as well as of the fact that they are under no obligation to receive care at this facility.  PASARR submitted to EDS on 11/12/2012 PASARR number received from EDS on 11/12/2012  FL2 transmitted to all facilities in geographic area requested by pt/family on 11/21/2012 FL2 transmitted to all facilities within larger geographic area on   Patient informed that his/her managed care company has contracts with or will negotiate with  certain facilities, including the following:     Patient/family informed of bed offers received:  11/22/2012 Patient chooses bed at [facility] Physician recommends and patient chooses bed at    Patient to be transferred to  on  [facility] on 11/29/2012 Patient to be transferred to facility by ambulance Sharin Mons)  The following physician request were entered in Epic:   Additional Comments:    Jacklynn Lewis, MSW, LCSWA  Clinical  Social Work [phone]

## 2012-11-12 NOTE — Progress Notes (Signed)
Occupational Therapy Treatment Patient Details Name: [name] MRN: [id number] DOB: 1941/07/27 Today's Date: 11/12/2012 Time: 5784-6962 OT Time Calculation (min): 30 min  OT Assessment / Plan / Recommendation Comments on Treatment Session Pt doing better this session but still continues to fatigue quickly. O2 sats stable at 92% with activity on 2L of O2.    Follow Up Recommendations  SNF;Supervision/Assistance - 24 hour    Barriers to Discharge       Equipment Recommendations  3 in 1 bedside comode    Recommendations for Other Services    Frequency Min 2X/week   Plan Discharge plan remains appropriate    Precautions / Restrictions Precautions Precautions: Fall   Pertinent Vitals/Pain Pt reported mild abdominal pain which she did not rate. Repositioned for comfort.    ADL  Grooming: Wash/dry face;Brushing hair;Set up Where Assessed - Grooming: Unsupported sitting Upper Body Bathing: Set up Where Assessed - Upper Body Bathing: Unsupported sitting Lower Body Bathing: Minimal assistance Where Assessed - Lower Body Bathing: Supported sit to stand Upper Body Dressing: Set up Where Assessed - Upper Body Dressing: Unsupported sitting Toilet Transfer: Minimal assistance Toilet Transfer Method: Sit to stand Toilet Transfer Equipment: Bedside commode Toileting - Architect and Hygiene: Min guard Where Assessed - Engineer, mining and Hygiene: Sit to stand from 3-in-1 or toilet Equipment Used: Rolling Coppinger Transfers/Ambulation Related to ADLs: Pt doing much better with safety today. Fewer VCs needed for hand placement during ambulation. ADL Comments: Pt initiated several RBs during spongebathe and during ambulation.    OT Diagnosis:    OT Problem List:   OT Treatment Interventions:     OT Goals ADL Goals ADL Goal: Grooming - Progress: Progressing toward goals ADL Goal: Toilet Transfer - Progress: Progressing toward goals ADL Goal: Toileting -  Clothing Manipulation - Progress: Progressing toward goals ADL Goal: Toileting - Hygiene - Progress: Progressing toward goals ADL Goal: Additional Goal #1 - Progress: Progressing toward goals  Visit Information  Last OT Received On: 11/12/12 Assistance Needed: +2 (safety to ambulate, equip and chair.)    Subjective Data  Subjective: I'm feeling a little bit better today.   Prior Functioning       Cognition  Overall Cognitive Status: Appears within functional limits for tasks assessed/performed Arousal/Alertness: Awake/alert Orientation Level: Appears intact for tasks assessed Behavior During Session: [facility] for tasks performed    Mobility  Shoulder Instructions Bed Mobility Supine to Sit: 4: Min assist;HOB elevated;With rails Details for Bed Mobility Assistance: Pt required assist to pull up to sit. Transfers Sit to Stand: 4: Min assist;With upper extremity assist;From bed;From chair/3-in-1 Stand to Sit: 4: Min assist;With upper extremity assist;With armrests;To chair/3-in-1 Details for Transfer Assistance: Min cues for hand placement and safety.       Exercises      Balance Dynamic Standing Balance Dynamic Standing - Balance Support: Bilateral upper extremity supported;During functional activity Dynamic Standing - Level of Assistance: 4: Min assist Dynamic Standing - Balance Activities: Forward lean/weight shifting Dynamic Standing - Comments: To spongebathe LB in standing.   End of Session OT - End of Session Equipment Utilized During Treatment: Gait belt Activity Tolerance: Patient tolerated treatment well Patient left: in bed;with call bell/phone within reach  GO     Caddie Randle A OTR/L [phone] 11/12/2012, 10:33 AM

## 2012-11-12 NOTE — Progress Notes (Signed)
Physical Therapy Treatment Patient Details Name: [name] MRN: [id number] DOB: June 27, 1941 Today's Date: 11/12/2012 Time: 4098-1191 PT Time Calculation (min): 32 min  PT Assessment / Plan / Recommendation Comments on Treatment Session  Pt required + 2 assist for equipment but progressing well as can be expected.  NG tube disconnected for mobility.  Pt on 2 lts nasal 2 chronically. Pt aware when she needs to stop and rest.     Follow Up Recommendations  Home health PT (Pt declines SNF)     Does the patient have the potential to tolerate intense rehabilitation     Barriers to Discharge        Equipment Recommendations       Recommendations for Other Services    Frequency Min 3X/week   Plan Discharge plan remains appropriate    Precautions / Restrictions Precautions Precautions: Fall Precaution Comments: wears 2L O2 chronically, monitor O2 sats and now NG tube and multiple lines  Restrictions Weight Bearing Restrictions: No   Pertinent Vitals/Pain No c/o pain   Mobility  Bed Mobility Bed Mobility: Supine to Sit Supine to Sit: 4: Min assist Details for Bed Mobility Assistance: Pt required assist to pull up to sit.  Transfers Transfers: Sit to Stand;Stand to Sit Sit to Stand: 4: Min assist;From bed;From toilet;From chair/3-in-1 Stand to Sit: To bed;To chair/3-in-1;To toilet;4: Min assist Details for Transfer Assistance: Min cues for hand placement and safety. Ambulation/Gait Ambulation/Gait Assistance: 1: +2 Total assist Ambulation/Gait: Patient Percentage: 80% Ambulation Distance (Feet): 60 Feet (20' x 3 standing rest breaks) Assistive device: Rolling Zaucha Ambulation/Gait Assistance Details: Pt limited by dyspnea. Requires increased time and freq rest breaks.  Pt aware of her limitations and knows when to stop and rest. Gait Pattern: Step-to pattern;Step-through pattern;Trunk flexed Gait velocity: decreased     PT Goals                                                progressing    Visit Information  Last PT Received On: 11/12/12 Assistance Needed: +2 (equipment (IV/O2))    Subjective Data      Cognition  Overall Cognitive Status: Appears within functional limits for tasks assessed/performed Arousal/Alertness: Awake/alert Orientation Level: Appears intact for tasks assessed Behavior During Session: [facility] for tasks performed    Balance  Dynamic Standing Balance Dynamic Standing - Balance Support: Bilateral upper extremity supported;During functional activity Dynamic Standing - Level of Assistance: 4: Min assist Dynamic Standing - Balance Activities: Forward lean/weight shifting Dynamic Standing - Comments: To spongebathe LB in standing.  End of Session PT - End of Session Equipment Utilized During Treatment: Gait belt Activity Tolerance: Patient limited by fatigue Patient left: in bed;with call bell/phone within reach Nurse Communication: Mobility status;Other (comment) (staff observed)   Felecia Shelling  PTA [facility]  Acute  Rehab Pager     [phone]

## 2012-11-12 NOTE — Clinical Social Work Psychosocial (Signed)
     Clinical Social Work Department BRIEF PSYCHOSOCIAL ASSESSMENT 11/12/2012  [name], [name]     Account Number:  [id number]     Admit date:  10/29/2012  Clinical Social Worker:  Jacelyn Grip  Date/Time:  11/12/2012 03:00 PM  Referred by:  Care Management  Date Referred:  11/12/2012 Referred for  SNF Placement   Other Referral:   Interview type:  Patient Other interview type:    PSYCHOSOCIAL DATA Living Status:  ALONE Admitted from facility:   Level of care:   Primary support name:  Erskine Squibb [phone] Primary support relationship to patient:  FRIEND Degree of support available:   lacking    CURRENT CONCERNS Current Concerns  Post-Acute Placement   Other Concerns:    SOCIAL WORK ASSESSMENT / PLAN CSW received notification from [facility] that PT recommending SNF vs HHPT because pt has not been agreeing to SNF placement.    CSW spoke with pt at bedside re: disposition plans. CSW clarified pt questions re: Home with [facility] services and private duty care vs rehab at [facility]. Pt states that she would like to try to return home, but is agreable to SNF search as an option in case she changes her mind prior to discharge. CSW clarified pt question about being placed from home if unable to manager and confirmed with pt that placement from home is an option as well. Pt weighing all options at this time. CSW notified RNCM of pt request for information about [facility] services and Private duty care agencies.    CSW to complete FL2 and will initiate SNF search when appropriate. Per RN, hopeful that pt TNA will be discontinued prior to discharge and CSW will await to see status of TNA before initiating SNF search. CSW to continue to follow and assist with pt discharge planning needs.   Assessment/plan status:  Psychosocial Support/Ongoing Assessment of Needs Other assessment/ plan:   discharge planning   Information/referral to community resources:   Referral to [facility]; [facility] list    PATIENTS/FAMILYS RESPONSE TO PLAN OF CARE: Pt alert and oriented x 4. Pt is hopeful to return home with [facility] services and 24 hour private care, but was willing to explore SNF option as a back up option to the plan for home.

## 2012-11-12 NOTE — Progress Notes (Signed)
Pt profile: 71 yowf admitted by [facility] 12/24 with dx of aecopd and treated accordingly with improvement in respiratory symptoms. She developed progressive abdominal distention with KUB and CT abd c/w SBO. Underwent exploratory laparotomy, lysis of adhesions 1/02. Returned to ICU on vent. PCCM asked to assist with vent/ICU mgmt   Lines, Tubes, etc: ETT 1/02 >> 1/03 R IJ CVL 1/02 >>   Microbiology: MRSA PCR 1/02 >> NEG 1/3 BC x 2 >>  1/3 UC >> NEG  Antibiotics:  vanco (empiric, fever) 1/3 >> 1/04 Zosyn (empiric, fever) 1/3 >> 1/10 (stop date ordered)   Studies/Events: 12/31 CT abd: Small bowel obstruction with dilated small bowel to the level of the distal ileum in the region of the pelvis where there is abrupt change of caliber which may be related to adhesions 1/2 Hypotension post op >pressor support  1/3 low grade fever/elevated WBC >pan cx   Consults:  CCS 12/30   Best Practice: DVT: SQ hep SUP: PPI Nutrition: none Glycemic control: SSI Sedation/analgesia: PRN fentanyl   [name]: No new complaints or increased wob.  Being followed by surgery closely.  Walking in halls with oxygen.   [name]: Filed Vitals:   11/12/12 0501  BP: 116/65  Pulse: 85  Temp: 97.6 F (36.4 C)  Resp: 18    Gen: NAD HEENT: WNL Neck: no JVD or LN Chest: decreased bs, no wheezing, a few basilar crackles. Cardiac: RRR, no MRG Abd: hypoactive Ext:no edema or cyanosis Neuro: a/o x 3 , moves all 4.   BMET    Component Value Date/Time   NA 137 11/12/2012 0650   K 3.3* 11/12/2012 0650   CL 99 11/12/2012 0650   CO2 34* 11/12/2012 0650   GLUCOSE 226* 11/12/2012 0650   BUN 5* 11/12/2012 0650   CREATININE 0.50 11/12/2012 0650   CALCIUM 8.5 11/12/2012 0650   GFRNONAA >90 11/12/2012 0650   GFRAA >90 11/12/2012 0650    CBC    Component Value Date/Time   WBC 9.3 11/12/2012 0650   RBC 2.55* 11/12/2012 0650   HGB 8.2* 11/12/2012 0650   HCT 24.1* 11/12/2012 0650   PLT 284 11/12/2012 0650   MCV 94.5 11/12/2012 0650   MCH  32.2 11/12/2012 0650   MCHC 34.0 11/12/2012 0650   RDW 12.9 11/12/2012 0650   LYMPHSABS 1.0 11/12/2012 0650   MONOABS 1.0 11/12/2012 0650   EOSABS 0.2 11/12/2012 0650   BASOSABS 0.0 11/12/2012 0650    CXR: no new film   IMPRESSION:  1)Respiratory failure, post-operative, resolved  SBO (small bowel obstruction) Status post laparotomy per CCS Potassium repleted.   2)Emphysema, severe Pt is stable currently from pulmonary standpoint.   3)Leukocytosis, fever 1/03, vague RLL opacity 1/04, mildly elevated PCT HAP vs abdominal source (less likely).  Continue abx  4) Anemia Stable today

## 2012-11-13 ENCOUNTER — Inpatient Hospital Stay (HOSPITAL_COMMUNITY): Payer: Medicare Other

## 2012-11-13 LAB — BASIC METABOLIC PANEL
BUN: 10 mg/dL (ref 6–23)
Calcium: 9.1 mg/dL (ref 8.4–10.5)
Chloride: 98 mEq/L (ref 96–112)
Creatinine, Ser: 0.57 mg/dL (ref 0.50–1.10)
GFR calc Af Amer: >90 mL/min (ref >90)

## 2012-11-13 LAB — GLUCOSE, CAPILLARY: Glucose-Capillary: 179 mg/dL — ABNORMAL HIGH (ref 70–99)

## 2012-11-13 MED ORDER — FAT EMULSION 20 % IV EMUL
250.0000 mL | INTRAVENOUS | Status: DC
  Filled 2012-11-13: qty 250

## 2012-11-13 MED ORDER — M.V.I. ADULT IV INJ
INJECTION | INTRAVENOUS | Status: AC
  Administered 2012-11-13: 17:00:00 via INTRAVENOUS
  Filled 2012-11-13: qty 2000

## 2012-11-13 MED ORDER — FAT EMULSION 20 % IV EMUL
250.0000 mL | INTRAVENOUS | Status: AC
  Administered 2012-11-13: 250 mL via INTRAVENOUS
  Filled 2012-11-13: qty 250

## 2012-11-13 MED ORDER — SODIUM CHLORIDE 0.9 % IJ SOLN: 10.0000 mL | INTRAMUSCULAR | Status: DC | PRN

## 2012-11-13 MED ORDER — TRACE MINERALS CR-CU-F-FE-I-MN-MO-SE-ZN IV SOLN
INTRAVENOUS | Status: DC
  Filled 2012-11-13: qty 1000

## 2012-11-13 MED ORDER — SODIUM CHLORIDE 0.9 % IJ SOLN
10.0000 mL | Freq: Two times a day (BID) | INTRAMUSCULAR | Status: DC
  Administered 2012-11-13: 10 mL
  Administered 2012-11-13: 40 mL
  Administered 2012-11-14 – 2012-11-29 (×10): 10 mL

## 2012-11-13 NOTE — Progress Notes (Signed)
6 Days Post-Op  Subjective: +flatus last night  Objective: Vital signs in last 24 hours: Temp:  [98 F (36.7 C)-98.6 F (37 C)] 98 F (36.7 C) (01/08 0511) Pulse Rate:  [95-97] 95  (01/08 0511) Resp:  [18] 18  (01/08 0511) BP: (124-152)/(60-77) 138/77 mmHg (01/08 0511) SpO2:  [97 %-100 %] 100 % (01/08 0511) Last BM Date: 10/25/12  Intake/Output from previous day: 01/07 0701 - 01/08 0700 In: 1357.5 [I.V.:495; IV Piggyback:450; TPN:412.5] Out: 1550 [Urine:1200; Emesis/NG output:350] Intake/Output this shift:    General appearance: alert, cooperative and no distress Resp: nonlabored Cardio: normal rate, regular GI: soft, NT, still with mild distension, wound okay, no peritonitis  Lab Results:   [facility] 11/12/12 0650  WBC 9.3  HGB 8.2*  HCT 24.1*  PLT 284   BMET  Basename 11/13/12 0600 11/12/12 0650  NA 137 137  K 4.0 3.3*  CL 98 99  CO2 36* 34*  GLUCOSE 146* 226*  BUN 10 5*  CREATININE 0.57 0.50  CALCIUM 9.1 8.5   PT/INR No results found for this basename: LABPROT:2,INR:2 in the last 72 hours ABG No results found for this basename: PHART:2,PCO2:2,PO2:2,HCO3:2 in the last 72 hours  Studies/Results: Dg Abd 2 Views  11/15/2012  *RADIOLOGY REPORT*  Clinical Data: Postoperative radiograph.  Lysis of adhesions. Bowel obstruction.  Persistent elevation of nasogastric tube output.  ABDOMEN - 2 VIEW  Comparison: 11/06/2012.  Findings: Nasogastric tube remains present in the proximal stomach. Proximal side port appears to be at the gastroesophageal junction. Fluid levels are present within large and small bowel, most compatible with postoperative ileus.  No plain film evidence of free air.  Bowel gas extends along the left flank, in the descending colon.  Midline skin staples are present.  Compared to the prior exam of 11/06/2012, the bowel gas pattern is Normalizing.  IMPRESSION: Scattered air fluid levels within large and small bowel and mild gaseous distention, likely  representing postoperative ileus. Nasogastric tube in the proximal stomach.   Original Report Authenticated By: Andreas Newport, M.D.     Anti-infectives: Anti-infectives     Start     Dose/Rate Route Frequency Ordered Stop   11/09/12 0000   vancomycin (VANCOCIN) 750 mg in sodium chloride 0.9 % 150 mL IVPB  Status:  Discontinued        750 mg 150 mL/hr over 60 Minutes Intravenous Every 12 hours 11/08/12 1023 11/09/12 1152   11/08/12 1200   piperacillin-tazobactam (ZOSYN) IVPB 3.375 g        3.375 g 12.5 mL/hr over 240 Minutes Intravenous Every 8 hours 11/08/12 1023 11/15/12 1159   11/08/12 1100   vancomycin (VANCOCIN) IVPB 1000 mg/200 mL premix        1,000 mg 200 mL/hr over 60 Minutes Intravenous  Once 11/08/12 1023 11/08/12 1322   11/04/12 1200   levofloxacin (LEVAQUIN) IVPB 500 mg  Status:  Discontinued        500 mg 100 mL/hr over 60 Minutes Intravenous Daily 11/04/12 1124 11/05/12 1436   11/01/12 1615   fluconazole (DIFLUCAN) tablet 200 mg        200 mg Oral  Once 11/01/12 1607 11/01/12 1636   11/01/12 1000   levofloxacin (LEVAQUIN) IVPB 750 mg  Status:  Discontinued        750 mg 100 mL/hr over 90 Minutes Intravenous Every 48 hours 10/30/12 1108 10/31/12 1428   11/01/12 1000   levofloxacin (LEVAQUIN) tablet 750 mg  Status:  Discontinued  750 mg Oral Every 48 hours 10/31/12 1428 11/04/12 1124   10/29/12 1045   levofloxacin (LEVAQUIN) IVPB 750 mg  Status:  Discontinued        750 mg 100 mL/hr over 90 Minutes Intravenous Every 24 hours 10/29/12 1034 10/30/12 1108          Assessment/Plan: s/p Procedure(s) (LRB) with comments: LAPAROSCOPY DIAGNOSTIC (N/A) EXPLORATORY LAPAROTOMY (N/A) LAPAROSCOPIC LYSIS OF ADHESIONS (N/A) LYSIS OF ADHESION () still with some mild distension. I did clamp her NG this am to see how she does, not sure that she is quite ready yet but did pass some flatus.  xrays pending.  She continues on TPN but has had central line for 6 days now.   Will ask for PICC line to be able to remove central line and continue TPN.  LOS: 15 days    Lodema Pilot DAVID 11/13/2012

## 2012-11-13 NOTE — Progress Notes (Signed)
NG Tube pulled back 8cm. No complications.

## 2012-11-13 NOTE — Progress Notes (Signed)
Peripherally Inserted Central Catheter/Midline Placement  The IV Nurse has discussed with the patient and/or persons authorized to consent for the patient, the purpose of this procedure and the potential benefits and risks involved with this procedure.  The benefits include less needle sticks, lab draws from the catheter and patient may be discharged home with the catheter.  Risks include, but not limited to, infection, bleeding, blood clot (thrombus formation), and puncture of an artery; nerve damage and irregular heat beat.  Alternatives to this procedure were also discussed.  PICC/Midline Placement Documentation        Timmothy Sours 11/13/2012, 1:08 PM

## 2012-11-13 NOTE — Progress Notes (Signed)
PARENTERAL NUTRITION CONSULT NOTE - Follow-up  Pharmacy Consult for TNA Indication: SBO  Allergies  Allergen Reactions  . Oxycodone-Acetaminophen     REACTION: headaches,nausea   Patient Measurements: Height: 5\' 5"  (165.1 cm) Weight: 114 lb 6.7 oz (51.9 kg) IBW/kg (Calculated) : 57  Usual Weight: 43 kg TBW increased likely due to fluid retention  Vital Signs: Temp: 98 F (36.7 C) (01/08 0511) Temp src: Oral (01/08 0511) BP: 138/77 mmHg (01/08 0511) Pulse Rate: 95  (01/08 0511) Intake/Output from previous day: 01/07 0701 - 01/08 0700 In: 1357.5 [I.V.:495; IV Piggyback:450; TPN:412.5] Out: 1550 [Urine:1200; Emesis/NG output:350] Intake/Output from this shift:    Labs:  [facility] 11/12/12 0650  WBC 9.3  HGB 8.2*  HCT 24.1*  PLT 284  APTT --  INR --    Basename 11/13/12 0600 11/12/12 0650 11/11/12 1300  NA 137 137 136  K 4.0 3.3* 3.6  CL 98 99 101  CO2 36* 34* 30  GLUCOSE 146* 226* 166*  BUN 10 5* 4*  CREATININE 0.57 0.50 0.54  LABCREA -- -- --  CREAT24HRUR -- -- --  CALCIUM 9.1 8.5 8.7  MG -- 1.6 1.6  PHOS -- 3.0 2.1*  PROT -- 4.5* --  ALBUMIN -- 1.7* --  AST -- 12 --  ALT -- 19 --  ALKPHOS -- 52 --  BILITOT -- 0.3 --  BILIDIR -- -- --  IBILI -- -- --  PREALBUMIN -- 6.9* --  TRIG -- 129 --  CHOLHDL -- -- --  CHOL -- 108 --   Estimated Creatinine Clearance: 52.8 ml/min (by C-G formula based on Cr of 0.57).   Basename 11/13/12 0630 11/12/12 2214 11/12/12 1409  GLUCAP 152* 184* 190*   Medical History: Past Medical History  Diagnosis Date  . Emphysema   . 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 Hyperlipidemia   . Hypertension    Medications:  Scheduled:     . albuterol  2.5 mg Nebulization Q6H  . antiseptic oral rinse  15 mL Mouth Rinse QID  . budesonide  0.25 mg Nebulization QID  . chlorhexidine  15 mL Mouth Rinse BID  . heparin  5,000 Units Subcutaneous Q8H  . insulin aspart  0-9 Units Subcutaneous Q8H  . lip balm  1 application Topical BID  .  metoCLOPramide (REGLAN) injection  5 mg Intravenous Q6H  . pantoprazole (PROTONIX) IV  40 mg Intravenous Daily  . piperacillin-tazobactam (ZOSYN)  IV  3.375 g Intravenous Q8H  . [COMPLETED] potassium chloride  10 mEq Intravenous Q1 Hr x 4  . tiotropium  18 mcg Inhalation Daily   Infusions:     . dextrose 5 % and 0.45 % NaCl with KCl 20 mEq/L 60 mL/hr at 11/12/12 1739  . [EXPIRED] TPN (CLINIMIX) +/- additives 40 mL/hr at 11/11/12 1749   And  . [EXPIRED] fat emulsion 250 mL (11/11/12 1749)  . TPN (CLINIMIX) +/- additives 40 mL/hr at 11/12/12 1730   Insulin Requirements in the past 24 hours:  no hx of DM CBGs 190, 184, 152 6 units of insulin/24 hours  Current Nutrition:  NPO x 15 days, POD #6 exploratory lap with lysis of adhesions (previous hysterectomy) TNA @ 40 ml/hr Lipids @ 10 ml/hr MWF ONLY  Fluids: MIVF: D5 1/2 NS with 20 mEQ K+ @ 60 ml/hr  Assessment: 71 yoF admit 12/24 with COPD exacerbation. Abdominal distention, surgery 1/2 for SBO.  Fluid retention, likely candidate for refeeding syndrome  Central access from 1/2, triple lumen catheter  Electrolytes: K+ now WNL s/p replacement on 11/12/12, all other lytes WNL  LFTs: WNL  Prealbumin: 6.9 (1/7)  TGs: 129 (1/7)  Trial of NG clamping this AM 11/13/12  Nutritional Goals: per RD 1500-1750 kCal, 70-90 grams of protein per day Clinimix E 5/20 at goal rate of 70 ml/hr would deliver 84 gm protein, avg 1680 kcal/day.  Plan:   Advance TNA to 55 ml/hr  TNA lab panel Mon/Thurs  Lipids, MVI and trace elements MWF only due to national back order.  Decrease mIVF to 45 ml/hr  Monitor lytes closely, anticipate refeeding syndrome.  Tammy Sours, Pharm.D. Clinical Oncology Pharmacist  Pager # [phone]  11/13/2012,7:41 AM

## 2012-11-13 NOTE — Progress Notes (Signed)
Pt profile: 71 yowf admitted by [facility] 12/24 with dx of aecopd and treated accordingly with improvement in respiratory symptoms. She developed progressive abdominal distention with KUB and CT abd c/w SBO. Underwent exploratory laparotomy, lysis of adhesions 1/02. Returned to ICU on vent. PCCM asked to assist with vent/ICU mgmt   Lines, Tubes, etc: ETT 1/02 >> 1/03 R IJ CVL 1/02 >>   Microbiology: MRSA PCR 1/02 >> NEG 1/3 BC x 2 >>  1/3 UC >> NEG  Antibiotics:  vanco (empiric, fever) 1/3 >> 1/04 Zosyn (empiric, fever) 1/3 >> 1/10 (stop date ordered)   Studies/Events: 12/31 CT abd: Small bowel obstruction with dilated small bowel to the level of the distal ileum in the region of the pelvis where there is abrupt change of caliber which may be related to adhesions 1/2 Hypotension post op >pressor support  1/3 low grade fever/elevated WBC >pan cx   Consults:  [facility] 12/30   Best Practice: DVT: SQ hep SUP: PPI Nutrition: none Glycemic control: SSI Sedation/analgesia: PRN fentanyl   [name]: No new complaints or increased wob.  Being followed by surgery closely.  Walking in halls with oxygen, still not able to have ng removed.   [name]: Filed Vitals:   11/13/12 1428  BP: 125/54  Pulse: 79  Temp: 98 F (36.7 C)  Resp: 17    Gen: wd female, nad HEENT: ng in place, op clear Neck: no JVD or LN Chest: decreased bs, no wheezing, a few basilar crackles. Cardiac: RRR, no MRG Abd: hypoactive Ext:no edema or cyanosis Neuro: a/o x 3 , moves all 4.   BMET    Component Value Date/Time   NA 137 11/13/2012 0600   K 4.0 11/13/2012 0600   CL 98 11/13/2012 0600   CO2 36* 11/13/2012 0600   GLUCOSE 146* 11/13/2012 0600   BUN 10 11/13/2012 0600   CREATININE 0.57 11/13/2012 0600   CALCIUM 9.1 11/13/2012 0600   GFRNONAA >90 11/13/2012 0600   GFRAA >90 11/13/2012 0600    CBC    Component Value Date/Time   WBC 9.3 11/12/2012 0650   RBC 2.55* 11/12/2012 0650   HGB 8.2* 11/12/2012 0650   HCT 24.1* 11/12/2012  0650   PLT 284 11/12/2012 0650   MCV 94.5 11/12/2012 0650   MCH 32.2 11/12/2012 0650   MCHC 34.0 11/12/2012 0650   RDW 12.9 11/12/2012 0650   LYMPHSABS 1.0 11/12/2012 0650   MONOABS 1.0 11/12/2012 0650   EOSABS 0.2 11/12/2012 0650   BASOSABS 0.0 11/12/2012 0650    CXR: no new film   IMPRESSION:  1)Respiratory failure, post-operative, resolved  SBO (small bowel obstruction) Status post laparotomy per [facility] Potassium repleted.   2)Emphysema, severe Pt is stable currently from pulmonary standpoint.   3)Leukocytosis, fever 1/03, vague RLL opacity 1/04, mildly elevated PCT HAP vs abdominal source (less likely).  Continue zosyn with stop date of 1/10  4) Anemia Continue to monitor

## 2012-11-13 NOTE — Progress Notes (Signed)
Physical Therapy Treatment Patient Details Name: [name] MRN: [id number] DOB: 08/16/1941 Today's Date: 11/13/2012 Time: 4098-1191 PT Time Calculation (min): 26 min  PT Assessment / Plan / Recommendation Comments on Treatment Session  NG tube disconnected for mobility.  Pt not feeling well today.  She needed encouragement to participate and did not want to sit up in a chair. Treatment today was on bed mobility and sit to stand transfers and pt was able to do well. Limited by nausea and fear of vomiting. Pt requires extra time and encouragement due to anxiet    Follow Up Recommendations  Home health PT;SNF;Other (comment) (Pt declines SNF, but need 24/7 care)     Does the patient have the potential to tolerate intense rehabilitation     Barriers to Discharge        Equipment Recommendations       Recommendations for Other Services    Frequency Min 3X/week   Plan Discharge plan remains appropriate    Precautions / Restrictions Precautions Precautions: Fall Precaution Comments: wears 2L O2 chronically, monitor O2 sats and now NG tube and multiple lines  Restrictions Weight Bearing Restrictions: No   Pertinent Vitals/Pain Pt c/o some abdominal pain    Mobility  Bed Mobility Bed Mobility: Supine to Sit;Sit to Supine Supine to Sit: 4: Min assist Sit to Supine: 4: Min assist Details for Bed Mobility Assistance: Pt required assist to pull up to sit. and assist to lift legs up onto bed. Repeated transfer x2 as pt needed to get up and use bed side commode after returning to bed Transfers Transfers: Sit to Stand;Stand to Sit Sit to Stand: From bed;From toilet;From chair/3-in-1;4: Min guard Stand to Sit: To bed;To chair/3-in-1;To toilet;4: Min guard Details for Transfer Assistance: Practiced sit to stand 5 repetitions from chair. Pt required several minutes of rest period between each rep.  Ambulation/Gait Ambulation/Gait Assistance: 4: Min assist Ambulation Distance (Feet): 3  Feet Assistive device: Rolling [place] Gait Pattern: Step-to pattern;Trunk flexed Gait velocity: decreased General Gait Details: pt without difficulty with weigh shift and stepping with legs Stairs: No Wheelchair Mobility Wheelchair Mobility: No    Exercises General Exercises - Lower Extremity Ankle Circles/Pumps: AROM;10 reps;Supine;Both Quad Sets: AROM;Both;5 reps;Supine Gluteal Sets: AROM;Both;5 reps;Standing   PT Diagnosis:    PT Problem List:   PT Treatment Interventions:     PT Goals Acute Rehab PT Goals Pt will go Supine/Side to Sit: with modified independence PT Goal: Supine/Side to Sit - Progress: Progressing toward goal Pt will go Sit to Stand: with modified independence PT Goal: Sit to Stand - Progress: Progressing toward goal Pt will go Stand to Sit: with modified independence PT Goal: Stand to Sit - Progress: Progressing toward goal Pt will Ambulate: 51 - 150 feet;with modified independence;with least restrictive assistive device  Visit Information  Last PT Received On: 11/13/12 Assistance Needed: +1    Subjective Data  Subjective: I don't feel well at all today Patient Stated Goal: to avoid vomiting   Cognition  Overall Cognitive Status: Appears within functional limits for tasks assessed/performed Arousal/Alertness: Awake/alert Orientation Level: Appears intact for tasks assessed Behavior During Session: [facility] for tasks performed Cognition - Other Comments: pt anxious and particular about the set up of her bed, pillows, etc.  Pt does not want the door or curtain closed.Marland KitchenMarland KitchenPt with claustrophobia    Balance  Static Sitting Balance Static Sitting - Balance Support: Bilateral upper extremity supported Static Sitting - Level of Assistance: 7: Independent Static Sitting -  Comment/# of Minutes: no reports of dizziness today  End of Session PT - End of Session Activity Tolerance: Patient limited by fatigue;Other (comment) (pt fearful of vomiting) Patient left: in  bed;with call bell/phone within reach;with family/visitor present Nurse Communication: Mobility status;Other (comment) (staff observed)   GP Bayard Hugger. [place], [place] [phone] 11/13/2012, 4:30 PM

## 2012-11-14 ENCOUNTER — Inpatient Hospital Stay (HOSPITAL_COMMUNITY): Payer: Medicare Other

## 2012-11-14 ENCOUNTER — Encounter (HOSPITAL_COMMUNITY): Payer: Medicare Other

## 2012-11-14 DIAGNOSIS — E785 Hyperlipidemia, unspecified: Secondary | ICD-10-CM

## 2012-11-14 DIAGNOSIS — I10 Essential (primary) hypertension: Secondary | ICD-10-CM

## 2012-11-14 LAB — PHOSPHORUS: Phosphorus: 2.9 mg/dL (ref 2.3–4.6)

## 2012-11-14 LAB — COMPREHENSIVE METABOLIC PANEL
AST: 15 U/L (ref 0–37)
Albumin: 2 g/dL — ABNORMAL LOW (ref 3.5–5.2)
BUN: 14 mg/dL (ref 6–23)
CO2: 37 mEq/L — ABNORMAL HIGH (ref 19–32)
Calcium: 9.2 mg/dL (ref 8.4–10.5)
Creatinine, Ser: 0.5 mg/dL (ref 0.50–1.10)
GFR calc non Af Amer: >90 mL/min (ref >90)

## 2012-11-14 LAB — GLUCOSE, CAPILLARY
Glucose-Capillary: 192 mg/dL — ABNORMAL HIGH (ref 70–99)
Glucose-Capillary: 195 mg/dL — ABNORMAL HIGH (ref 70–99)
Glucose-Capillary: 205 mg/dL — ABNORMAL HIGH (ref 70–99)

## 2012-11-14 LAB — MAGNESIUM: Magnesium: 1.7 mg/dL (ref 1.5–2.5)

## 2012-11-14 MED ORDER — INSULIN ASPART 100 UNIT/ML ~~LOC~~ SOLN
0.0000 [IU] | Freq: Four times a day (QID) | SUBCUTANEOUS | Status: DC
  Administered 2012-11-14: 2 [IU] via SUBCUTANEOUS
  Administered 2012-11-14: 3 [IU] via SUBCUTANEOUS
  Administered 2012-11-15 – 2012-11-16 (×5): 2 [IU] via SUBCUTANEOUS
  Administered 2012-11-16 (×2): 1 [IU] via SUBCUTANEOUS
  Administered 2012-11-17: 2 [IU] via SUBCUTANEOUS
  Administered 2012-11-17: 1 [IU] via SUBCUTANEOUS
  Administered 2012-11-17: 2 [IU] via SUBCUTANEOUS
  Administered 2012-11-18: 1 [IU] via SUBCUTANEOUS
  Administered 2012-11-18: 2 [IU] via SUBCUTANEOUS
  Administered 2012-11-18: 1 [IU] via SUBCUTANEOUS
  Administered 2012-11-18: 2 [IU] via SUBCUTANEOUS
  Administered 2012-11-18: 1 [IU] via SUBCUTANEOUS
  Administered 2012-11-19 (×2): 2 [IU] via SUBCUTANEOUS
  Administered 2012-11-19 – 2012-11-20 (×2): 1 [IU] via SUBCUTANEOUS
  Administered 2012-11-20: 2 [IU] via SUBCUTANEOUS
  Administered 2012-11-21: 1 [IU] via SUBCUTANEOUS
  Administered 2012-11-21 – 2012-11-22 (×7): 2 [IU] via SUBCUTANEOUS
  Administered 2012-11-23: 1 [IU] via SUBCUTANEOUS
  Administered 2012-11-23: 2 [IU] via SUBCUTANEOUS

## 2012-11-14 MED ORDER — ACETAMINOPHEN 10 MG/ML IV SOLN
1000.0000 mg | Freq: Four times a day (QID) | INTRAVENOUS | Status: AC
  Administered 2012-11-14 – 2012-11-15 (×4): 1000 mg via INTRAVENOUS
  Filled 2012-11-14 (×4): qty 100

## 2012-11-14 MED ORDER — CLINIMIX E/DEXTROSE (5/20) 5 % IV SOLN
INTRAVENOUS | Status: AC
  Administered 2012-11-14: 18:00:00 via INTRAVENOUS
  Filled 2012-11-14: qty 2000

## 2012-11-14 NOTE — Progress Notes (Signed)
Physical Therapy Treatment/goals updated. Patient Details Name: [name] MRN: [id number] DOB: 05-11-1941 Today's Date: 11/14/2012 Time: 6213-0865 PT Time Calculation (min): 29 min  PT Assessment / Plan / Recommendation Comments on Treatment Session  Pt. stated that she felt good earlier, now c/o nausea but did participate in ambulation and actually ambulated farther today in 1 attempt.     Follow Up Recommendations  SNF;Home health PT     Does the patient have the potential to tolerate intense rehabilitation     Barriers to Discharge        Equipment Recommendations   (rollator)    Recommendations for Other Services    Frequency Min 3X/week   Plan Discharge plan remains appropriate;Frequency remains appropriate    Precautions / Restrictions Precautions Precautions: Fall Precaution Comments: legs weak for sit<>stand--will buckle, O2, NG suction, etc   Pertinent Vitals/Pain sats after ambulation 90% 2l  HR 115 After rest 95% 2l  HR 109    Mobility  Bed Mobility Supine to Sit: 4: Min assist Sit to Supine: 4: Min assist Details for Bed Mobility Assistance: Pt required assist to pull up to sit. and assist to lift legs up onto bed. Transfers Transfers: Stand Pivot Transfers Sit to Stand: 4: Min assist;With armrests;From bed;From chair/3-in-1 Stand to Sit: To chair/3-in-1;To bed;With armrests;With upper extremity assist Stand Pivot Transfers: 4: Min assist Details for Transfer Assistance: Pt. has a slight delay to stand as legs are weak and they may buckle. decreased control of descent. Pt. assisted back into bed as she did not want to stay up, assistance to stand provided. Ambulation/Gait Ambulation/Gait Assistance: 1: +2 Total assist Ambulation/Gait: Patient Percentage: 70% Ambulation Distance (Feet): 65 Feet Assistive device: Rolling [place] Ambulation/Gait Assistance Details: ambulated farther  in 1 attempt today. Pt. is dyspneic , encouraged pursed lip  breathing. Gait Pattern: Step-to pattern;Trunk flexed;Decreased stride length Gait velocity: decreased    Exercises     PT Diagnosis:    PT Problem List:   PT Treatment Interventions:     PT Goals Acute Rehab PT Goals PT Goal Formulation: With patient Time For Goal Achievement: 11/28/12 Potential to Achieve Goals: Good Pt will go Supine/Side to Sit: with modified independence PT Goal: Supine/Side to Sit - Progress: Revised due to lack of progress Pt will go Sit to Stand: with modified independence PT Goal: Sit to Stand - Progress: Revised due to lack of progress Pt will go Stand to Sit: with modified independence PT Goal: Stand to Sit - Progress: Revised due to lack of progress Pt will Ambulate: 51 - 150 feet;with modified independence;with rolling [place] PT Goal: Ambulate - Progress: Revised due to lack of progress Pt will Go Up / Down Stairs: 1-2 stairs;with supervision PT Goal: Up/Down Stairs - Progress: Revised due to lack of progress  Visit Information  Last PT Received On: 11/14/12 Assistance Needed: +2 (to walk in hall for equip)    Subjective Data  Subjective: I was better, i am nauseated now.   Cognition  Overall Cognitive Status: Appears within functional limits for tasks assessed/performed Behavior During Session: Anxious Cognition - Other Comments: pt anxious and particular about the set up of her bed, pillows, etc. Pt does not want the door or curtain closed.Marland KitchenMarland KitchenPt with claustrophobia     Balance     End of Session PT - End of Session Equipment Utilized During Treatment: Gait belt;Oxygen Activity Tolerance: Patient limited by fatigue (nausea) Patient left: in bed (pt declined to stay in chair.) Nurse Communication:  Mobility status   GP     Rada Hay 11/14/2012, 4:18 PM

## 2012-11-14 NOTE — Progress Notes (Signed)
7 Days Post-Op  Subjective: Doesn't feel very good, with NG in place taking a fair amount of medicine for pain, no flatus, no BM, not walking much or doing pulmonary toilet.  Objective: Vital signs in last 24 hours: Temp:  [97.9 F (36.6 C)-98.1 F (36.7 C)] 97.9 F (36.6 C) (01/09 0540) Pulse Rate:  [79-100] 100  (01/09 0540) Resp:  [16-17] 16  (01/09 0540) BP: (124-149)/(54-74) 149/74 mmHg (01/09 0540) SpO2:  [96 %-100 %] 98 % (01/09 0835) Last BM Date:  (Unknown per pt)  500 per NG, No BM TNA/NPO, AFEBRILE, vss,  CMP is normal, mild anemia labs 11/12/12  Intake/Output from previous day: November 14, 2022 0701 - 01/09 0700 In: 1452 [I.V.:540; IV Piggyback:132; TPN:780] Out: 2401 [Urine:1900; Emesis/NG output:501] Intake/Output this shift: Total I/O In: -  Out: 250 [Urine:250]  General appearance: alert, cooperative, no distress and uncomfortable Resp: BS diminished both side with few rales GI: mildly distended, I don't hear any bowel sounds, NG is working normally, wound is healing normally.  Lab Results:   [facility] 11/12/12 0650  WBC 9.3  HGB 8.2*  HCT 24.1*  PLT 284    BMET  Basename 11/14/12 0350 14-Nov-2012 0600  NA 136 137  K 4.1 4.0  CL 96 98  CO2 37* 36*  GLUCOSE 194* 146*  BUN 14 10  CREATININE 0.50 0.57  CALCIUM 9.2 9.1   PT/INR No results found for this basename: LABPROT:2,INR:2 in the last 72 hours   Lab 11/14/12 0350 11/12/12 0650  AST 15 12  ALT 19 19  ALKPHOS 71 52  BILITOT 0.3 0.3  PROT 5.0* 4.5*  ALBUMIN 2.0* 1.7*     Lipase  No results found for this basename: lipase     Studies/Results: Dg Abd 2 Views  14-Nov-2012  *RADIOLOGY REPORT*  Clinical Data: Postop ileus.  NG tube placement.  ABDOMEN - 2 VIEW  Comparison: 11/11/2012  Findings: The NG tube is in place with the tip in the descending duodenum.  Side port is in the proximal duodenum.  Mild gaseous distention of bowel persists, likely involving both large and small bowel compatible with  ileus.  No free air.   No organomegaly. Visualized lung bases are clear.  No acute bony abnormality.  IMPRESSION: Continued mild gaseous distention of bowel, likely mild ileus.  NG tube tip is in the descending duodenum.   Original Report Authenticated By: Charlett Nose, M.D.     Medications:    . albuterol  2.5 mg Nebulization Q6H  . antiseptic oral rinse  15 mL Mouth Rinse QID  . budesonide  0.25 mg Nebulization QID  . chlorhexidine  15 mL Mouth Rinse BID  . heparin  5,000 Units Subcutaneous Q8H  . insulin aspart  0-9 Units Subcutaneous Q8H  . lip balm  1 application Topical BID  . metoCLOPramide (REGLAN) injection  5 mg Intravenous Q6H  . pantoprazole (PROTONIX) IV  40 mg Intravenous Daily  . piperacillin-tazobactam (ZOSYN)  IV  3.375 g Intravenous Q8H  . sodium chloride  10-40 mL Intracatheter Q12H  . tiotropium  18 mcg Inhalation Daily    Assessment/Plan Small bowel obstruction s/p Diagnostic laparoscopy, exploratory laparotomy, lysis of adhesions, 11/07/2012, Axel Filler, MD Post op ileus End stage COPD Hypertension Hyperlipidemia Hx of ovarian ca with abdominal hysterectomy Post op anemia PCM prealbumin is 6.9, on TNA  Plan:  I think she just has a prolonged ileus, i will continue her TNA, and try to get her mobilized more.  Send  her down for an xray, encourage pulmonary toilet.  She is on day 6 of Zosyn, will discuss when to stop this. She has PT and OT consults         LOS: 16 days    JENNINGS,WILLARD 11/14/2012  Not much change.  She got nauseated with NG clamped.  She is nontender in her abdomen but has persistent ileus.  No bowel sounds appreciated.

## 2012-11-14 NOTE — Progress Notes (Signed)
PARENTERAL NUTRITION CONSULT NOTE - Follow-up  Pharmacy Consult for TNA Indication: SBO  Allergies  Allergen Reactions  . Oxycodone-Acetaminophen     REACTION: headaches,nausea   Patient Measurements: Height: 5\' 5"  (165.1 cm) Weight: 114 lb 6.7 oz (51.9 kg) IBW/kg (Calculated) : 57  Usual Weight: 43 kg  Vital Signs: Temp: 97.9 F (36.6 C) (01/09 0540) Temp src: Oral (01/09 0540) BP: 149/74 mmHg (01/09 0540) Pulse Rate: 100  (01/09 0540) Intake/Output from previous day: 01/08 0701 - 01/09 0700 In: 1452 [I.V.:540; IV Piggyback:132; TPN:780] Out: 2401 [Urine:1900; Emesis/NG output:501] Intake/Output from this shift: Total I/O In: -  Out: 250 [Urine:250]  Labs:  [facility] 11/12/12 0650  WBC 9.3  HGB 8.2*  HCT 24.1*  PLT 284  APTT --  INR --    Basename 11/14/12 0350 11/13/12 0600 11/12/12 0650 11/11/12 1300  NA 136 137 137 --  K 4.1 4.0 3.3* --  CL 96 98 99 --  CO2 37* 36* 34* --  GLUCOSE 194* 146* 226* --  BUN 14 10 5* --  CREATININE 0.50 0.57 0.50 --  LABCREA -- -- -- --  CREAT24HRUR -- -- -- --  CALCIUM 9.2 9.1 8.5 --  MG 1.7 -- 1.6 1.6  PHOS 2.9 -- 3.0 2.1*  PROT 5.0* -- 4.5* --  ALBUMIN 2.0* -- 1.7* --  AST 15 -- 12 --  ALT 19 -- 19 --  ALKPHOS 71 -- 52 --  BILITOT 0.3 -- 0.3 --  BILIDIR -- -- -- --  IBILI -- -- -- --  PREALBUMIN -- -- 6.9* --  TRIG -- -- 129 --  CHOLHDL -- -- -- --  CHOL -- -- 108 --   Estimated Creatinine Clearance: 52.8 ml/min (by C-G formula based on Cr of 0.5).   Basename 11/14/12 0543 11/13/12 2149 11/13/12 1626  GLUCAP 192* 178* 179*   Medications:  Scheduled:     . albuterol  2.5 mg Nebulization Q6H  . antiseptic oral rinse  15 mL Mouth Rinse QID  . budesonide  0.25 mg Nebulization QID  . chlorhexidine  15 mL Mouth Rinse BID  . heparin  5,000 Units Subcutaneous Q8H  . insulin aspart  0-9 Units Subcutaneous Q8H  . lip balm  1 application Topical BID  . metoCLOPramide (REGLAN) injection  5 mg Intravenous Q6H    . pantoprazole (PROTONIX) IV  40 mg Intravenous Daily  . piperacillin-tazobactam (ZOSYN)  IV  3.375 g Intravenous Q8H  . sodium chloride  10-40 mL Intracatheter Q12H  . tiotropium  18 mcg Inhalation Daily   Infusions:     . dextrose 5 % and 0.45 % NaCl with KCl 20 mEq/L 45 mL/hr at 11/14/12 0700  . TPN (CLINIMIX) +/- additives 55 mL/hr at 11/13/12 1725   And  . fat emulsion 250 mL (11/13/12 1724)  . [EXPIRED] TPN (CLINIMIX) +/- additives 40 mL/hr at 11/12/12 1730  . [DISCONTINUED] fat emulsion    . [DISCONTINUED] TPN (CLINIMIX) +/- additives     Insulin Requirements in the past 24 hours:  no hx of DM; q8hr CBG with sensitive scale. CBGs 179,178/194 6 units of insulin/24 hours  Current Nutrition:  NPO x 16 days, POD #7 exploratory lap with lysis of adhesions (previous hysterectomy) TNA @ 55 ml/hr Lipids @ 10 ml/hr MWF ONLY  Fluids: MIVF: D5 1/2 NS with 20 mEQ K+ @ 45 ml/hr  Assessment: 71 yoF admit 12/24 with COPD exacerbation. Abdominal distention, surgery 1/2 for SBO.  Fluid retention, likely candidate  for refeeding syndrome  Central access from 1/2 with triple lumen catheter, now with PICC placed 1/8  Electrolytes: now WNL s/p replacement of K and Phos prior to TNA.  LFTs: WNL  Prealbumin: 6.9 (1/7)  TGs: 129 (1/7)  Trial of NG clamping AM 11/13/12  Decreased NG output, + bowel sounds, continues elevated CBG.  Nutritional Goals: per RD 1500-1750 kCal, 70-90 grams of protein per day Clinimix E 5/20 at goal rate of 70 ml/hr would deliver 84 gm protein, avg 1680 kcal/day.  Plan:   Continue TNA at 55 ml/hr   TNA lab panel Mon/Thurs  Lipids, MVI and trace elements MWF only due to national back order.  Continue mIVF to 45 ml/hr  Increase CBG to q6h, with same sensitive scale.  Otho Bellows PharmD Pager # [phone] 11/14/2012,10:22 AM

## 2012-11-14 NOTE — Progress Notes (Signed)
Per NT pt had a medium loose stool this afternoon. Will continue to monitor.

## 2012-11-14 NOTE — Progress Notes (Signed)
After being in bed for a few minutes, pt began to feel better. Titrated her O2 back down on [place] and at 2L via [place] she is currently sating 100%. Will continue to monitor.

## 2012-11-14 NOTE — Progress Notes (Signed)
Pt got up to [facility] and desaturated to 68% sp02 on 2L via [place]. Bumped pt up to 6L [place] and sats stayed down at 68% for 1 minute. Decided to put the pt on a nonrebreather at 15L and pts sats came back up to 100%. Called RT to come take a look at the pt and for recommendation. Will continue to monitor.

## 2012-11-14 NOTE — Progress Notes (Signed)
Pt profile: 71 yowf admitted by [facility] 12/24 with dx of aecopd and treated accordingly with improvement in respiratory symptoms. She developed progressive abdominal distention with KUB and CT abd c/w SBO. Underwent exploratory laparotomy, lysis of adhesions 1/02. Returned to ICU on vent. PCCM asked to assist with vent/ICU mgmt   Lines, Tubes, etc: ETT 1/02 >> 1/03 R IJ CVL 1/02 >>   Microbiology: MRSA PCR 1/02 >> NEG 1/3 BC x 2 >>  1/3 UC >> NEG  Antibiotics:  vanco (empiric, fever) 1/3 >> 1/04 Zosyn (empiric, fever) 1/3 >> 1/10 (stop date ordered)   Studies/Events: 12/31 CT abd: Small bowel obstruction with dilated small bowel to the level of the distal ileum in the region of the pelvis where there is abrupt change of caliber which may be related to adhesions 1/2 Hypotension post op >pressor support  1/3 low grade fever/elevated WBC >pan cx   Consults:  CCS 12/30   Best Practice: DVT: SQ hep SUP: PPI Nutrition: none Glycemic control: SSI Sedation/analgesia: PRN fentanyl   [name]: No new complaints or increased wob.  Being followed by surgery closely.  Walking in halls with oxygen, still not able to have ng removed. Labs have been ok.   [name]: Filed Vitals:   11/14/12 0540  BP: 149/74  Pulse: 100  Temp: 97.9 F (36.6 C)  Resp: 16    Gen: wd female, nad.  Still requiring NG HEENT: ng in place, op clear Neck: no JVD or LN Chest: decreased bs, no wheezing, a few basilar crackles. Cardiac: RRR, no MRG Abd: hypoactive Ext:no edema or cyanosis Neuro: a/o x 3 , moves all 4.   BMET    Component Value Date/Time   NA 136 11/14/2012 0350   K 4.1 11/14/2012 0350   CL 96 11/14/2012 0350   CO2 37* 11/14/2012 0350   GLUCOSE 194* 11/14/2012 0350   BUN 14 11/14/2012 0350   CREATININE 0.50 11/14/2012 0350   CALCIUM 9.2 11/14/2012 0350   GFRNONAA >90 11/14/2012 0350   GFRAA >90 11/14/2012 0350    CBC    Component Value Date/Time   WBC 9.3 11/12/2012 0650   RBC 2.55* 11/12/2012 0650   HGB  8.2* 11/12/2012 0650   HCT 24.1* 11/12/2012 0650   PLT 284 11/12/2012 0650   MCV 94.5 11/12/2012 0650   MCH 32.2 11/12/2012 0650   MCHC 34.0 11/12/2012 0650   RDW 12.9 11/12/2012 0650   LYMPHSABS 1.0 11/12/2012 0650   MONOABS 1.0 11/12/2012 0650   EOSABS 0.2 11/12/2012 0650   BASOSABS 0.0 11/12/2012 0650    CXR: no new film   IMPRESSION:  1)Respiratory failure, post-operative, resolved  SBO (small bowel obstruction) Status post laparotomy per CCS    2)Emphysema, severe No acute bronchospasm or increased wob at rest.     3)Leukocytosis, fever 1/03, vague RLL opacity 1/04, mildly elevated PCT HAP vs abdominal source (less likely).  Continue zosyn with stop date of 1/10  4) Anemia Continue to monitor.  Will check in am.

## 2012-11-15 DIAGNOSIS — E46 Unspecified protein-calorie malnutrition: Secondary | ICD-10-CM

## 2012-11-15 LAB — GLUCOSE, CAPILLARY: Glucose-Capillary: 179 mg/dL — ABNORMAL HIGH (ref 70–99)

## 2012-11-15 MED ORDER — INSULIN GLARGINE 100 UNIT/ML ~~LOC~~ SOLN
10.0000 [IU] | Freq: Every day | SUBCUTANEOUS | Status: DC
  Administered 2012-11-15 – 2012-11-24 (×9): 10 [IU] via SUBCUTANEOUS

## 2012-11-15 MED ORDER — TRACE MINERALS CR-CU-F-FE-I-MN-MO-SE-ZN IV SOLN
INTRAVENOUS | Status: AC
  Administered 2012-11-15: 17:00:00 via INTRAVENOUS
  Filled 2012-11-15: qty 2000

## 2012-11-15 MED ORDER — FAT EMULSION 20 % IV EMUL
240.0000 mL | INTRAVENOUS | Status: AC
  Administered 2012-11-15: 240 mL via INTRAVENOUS
  Filled 2012-11-15: qty 250

## 2012-11-15 NOTE — Progress Notes (Addendum)
Pt profile: 72 y/o wf admitted by [facility] 12/24 with dx of AECOP and treated accordingly with improvement in respiratory symptoms. She developed progressive abdominal distention with KUB and CT abd c/w SBO. Underwent exploratory laparotomy, lysis of adhesions 1/02. Returned to ICU on vent. PCCM asked to assist with vent/ICU mgmt   Lines, Tubes, etc: ETT 1/02 >> 1/03 R IJ CVL 1/02 >>   Microbiology: MRSA PCR 1/02 >> NEG 1/3 BC x 2 >>not collected 1/3 UC >> NEG  Antibiotics:  vanco (empiric, fever) 1/3 >> 1/04 Zosyn (empiric, fever) 1/3 >> 1/10 (stop date ordered)   Studies/Events: 12/31 CT abd: Small bowel obstruction with dilated small bowel to the level of the distal ileum in the region of the pelvis where there is abrupt change of caliber which may be related to adhesions 1/2 Hypotension post op >pressor support  1/3 low grade fever/elevated WBC >pan cx  1/9 - ileus continues, n/v with NGT clamped, stable but tenuous respiratory status  Consults:  CCS 12/30   Best Practice: DVT: SQ hep SUP: PPI Nutrition: none Glycemic control: SSI Sedation/analgesia: PRN fentanyl   [name]:  Pt had episode yesterday where she became sob after returning from her walk, and tells me her sats were in the 60's??  She settled down quickly, and returned to baseline.  Today she feels ok, with no increased wob, but gets very winded with any activity.  Still requiring NG.   Objective:  Filed Vitals:   11/14/12 1948 11/14/12 2100 11/15/12 0545 11/15/12 0758  BP:  104/63 124/69   Pulse:  98 95   Temp:  98.6 F (37 C) 97.6 F (36.4 C)   TempSrc:  Oral Oral   Resp:  16 18   Height:      Weight:      SpO2: 100% 100% 100% 100%   Gen: wd female, nad.   HEENT: NGT in place, op clear Neck: no JVD or LN Chest: decreased bs, no wheezing, a few basilar crackles. Cardiac: RRR, no MRG Abd: hypoactive, surgical incision midline c/d/i, staples intact Ext:no edema or cyanosis Neuro: a/o x 3 , moves all  4.   BMET  Lab 11/14/12 0350 11/13/12 0600 11/12/12 0650 11/11/12 1300 11/10/12 0400 11/09/12 0400  NA 136 137 137 136 136 --  K 4.1 4.0 -- -- -- --  CL 96 98 99 101 103 --  CO2 37* 36* 34* 30 27 --  GLUCOSE 194* 146* 226* 166* 113* --  BUN 14 10 5* 4* 6 --  CREATININE 0.50 0.57 0.50 0.54 0.59 --  CALCIUM 9.2 9.1 8.5 8.7 8.3* --  MG 1.7 -- 1.6 1.6 -- 1.3*  PHOS 2.9 -- 3.0 2.1* -- 2.0*   CBC  Lab 11/12/12 0650 11/10/12 0400 11/09/12 0400  HGB 8.2* 9.1* 8.9*  HCT 24.1* 27.0* 27.2*  WBC 9.3 14.8* 17.3*  PLT 284 264 223   CXR: no new film   IMPRESSION:  SBO (small bowel obstruction) - Status post laparotomy per CCS.  NGT remains to suction.  Nausea 1/9 with clamping.   Plan: -rec's per Surgery -TPN per pharmacy -trend BMP  Post-operative respiratory failure - Resolved.  Emphysema, severe - No acute bronchospasm or increased wob at rest.     Plan: -aggressive pulmonary hygiene IS/Flutter -PRN CXR -pulmicort, BD's   Leukocytosis - fever 1/03, vague RLL opacity 1/04, mildly elevated PCT.  HAP vs abdominal source (less likely).    Plan: -Continue zosyn with stop date of 1/10  Anemia - in setting of chronic disease / acute critical illness.  No indication for transfusion.   Plan: -f/u cbc in am    Canary Brim, NP-C [facility] Pgr: [phone] or [phone]  Pt seen and examined today personally, and appears stable.  However, she is at high risk for pulmonary decompensation, and must stay on acute med/surg unit at this time.  Agree with evaluation, problem list as above.

## 2012-11-15 NOTE — Care Management (Signed)
Cm spoke with pt concerning dc plans. Pt feels LTACH most appropriate facility upon dc. Pt states having no children, lives alone, closet relative is brother in [place]. [facility] rep jenny @ [phone] made aware of referral. Per rep pt appropriate for LTACH, bed available when pt medically stable for d/c.Will continue to follow.   Livvy Spilman,RN,BSN [id number].

## 2012-11-15 NOTE — Progress Notes (Signed)
Had some small bowel movements.  Not much flatus. No pain.  Abdomen exam minimally changed. I spoke with her surgeon today and he feels that he performed a complete adhesiolysis, her bowel was very distended at the time and he feels that it is not surprising that she has had persistent ileus.

## 2012-11-15 NOTE — Progress Notes (Signed)
Clinical Social Worker continuing to follow for discharge planning. Clinical Social Worker noted RNCM progress note that pt feels LTACH most appropriate facility upon dc and pt appropriate for [facility] and bed available when pt medically stable for d/c. Clinical Social Worker to continue to follow to assist as appropriate.  Jacklynn Lewis, MSW, LCSWA  Clinical Social Work [phone]

## 2012-11-15 NOTE — Progress Notes (Signed)
PARENTERAL NUTRITION CONSULT NOTE - Follow-up  Pharmacy Consult for TNA Indication: SBO  Allergies  Allergen Reactions  . Oxycodone-Acetaminophen     REACTION: headaches,nausea   Patient Measurements: Height: 5\' 5"  (165.1 cm) Weight: 114 lb 6.7 oz (51.9 kg) IBW/kg (Calculated) : 57  Usual Weight: 43 kg  Vital Signs: Temp: 97.6 F (36.4 C) (01/10 0545) Temp src: Oral (01/10 0545) BP: 124/69 mmHg (01/10 0545) Pulse Rate: 95  (01/10 0545) Intake/Output from previous day: 01/09 0701 - 01/10 0700 In: 324.3 [I.V.:324.3] Out: 2251 [Urine:1650; Emesis/NG output:600; Stool:1] Intake/Output from this shift:    Labs: No results found for this basename: WBC:3,HGB:3,HCT:3,PLT:3,APTT:3,INR:3 in the last 72 hours  Basename 11/14/12 0350 11/13/12 0600  NA 136 137  K 4.1 4.0  CL 96 98  CO2 37* 36*  GLUCOSE 194* 146*  BUN 14 10  CREATININE 0.50 0.57  LABCREA -- --  CREAT24HRUR -- --  CALCIUM 9.2 9.1  MG 1.7 --  PHOS 2.9 --  PROT 5.0* --  ALBUMIN 2.0* --  AST 15 --  ALT 19 --  ALKPHOS 71 --  BILITOT 0.3 --  BILIDIR -- --  IBILI -- --  PREALBUMIN -- --  TRIG -- --  CHOLHDL -- --  CHOL -- --   Estimated Creatinine Clearance: 52.8 ml/min (by C-G formula based on Cr of 0.5).   Basename 11/15/12 0539 11/14/12 2344 11/14/12 1801  GLUCAP 179* 195* 205*   Medications:  Scheduled:     . acetaminophen  1,000 mg Intravenous Q6H  . albuterol  2.5 mg Nebulization Q6H  . antiseptic oral rinse  15 mL Mouth Rinse QID  . budesonide  0.25 mg Nebulization QID  . chlorhexidine  15 mL Mouth Rinse BID  . heparin  5,000 Units Subcutaneous Q8H  . insulin aspart  0-9 Units Subcutaneous Q6H  . lip balm  1 application Topical BID  . metoCLOPramide (REGLAN) injection  5 mg Intravenous Q6H  . pantoprazole (PROTONIX) IV  40 mg Intravenous Daily  . [COMPLETED] piperacillin-tazobactam (ZOSYN)  IV  3.375 g Intravenous Q8H  . sodium chloride  10-40 mL Intracatheter Q12H  . tiotropium  18  mcg Inhalation Daily  . [DISCONTINUED] insulin aspart  0-9 Units Subcutaneous Q8H   Infusions:     . dextrose 5 % and 0.45 % NaCl with KCl 20 mEq/L 45 mL/hr at 11/14/12 1042  . [EXPIRED] TPN (CLINIMIX) +/- additives 55 mL/hr at 11/13/12 1725   And  . [EXPIRED] fat emulsion 250 mL (11/13/12 1724)  . TPN (CLINIMIX) +/- additives 55 mL/hr at 11/14/12 1741   Insulin Requirements in the past 24 hours:  no hx of DM; q8hr CBG with sensitive scale. 11 units of insulin/24 hours  Current Nutrition:  NPO  TNA @ 55 ml/hr Lipids @ 10 ml/hr MWF ONLY  Fluids: MIVF: D5 1/2 NS with 20 mEQ K+ @ 45 ml/hr  Assessment: 71 yoF admit 12/24 with COPD exacerbation. Abdominal distention, surgery 1/2 for SBO.  Electrolytes:  WNL (1/10)   CBG: elevated >150  LFTs: WNL (1/10)  Prealbumin: 6.9 (1/7)  TGs: 129 (1/7)    Nutritional Goals: per RD 1500-1750 kCal, 70-90 grams of protein per day Clinimix E 5/20 at goal rate of 70 ml/hr would deliver 84 gm protein, avg 1680 kcal/day.  Plan:   Continue CBG q6h, sensitive scale. Add Lantus 10 units daily  Continue TNA at 55 ml/hr - will plan to advance when CBG normalize  Continue mIVF to 45 ml/hr  TNA lab panel Mon/Thurs  Lipids, MVI and trace elements MWF only due to national back order.  Gwen Her PharmD  [phone] 11/15/2012 7:48 AM

## 2012-11-15 NOTE — Progress Notes (Signed)
8 Days Post-Op  Subjective: She has had some bowel movement, but doesn't really remember any flatus.  Her abdomen remains distended.  Objective: Vital signs in last 24 hours: Temp:  [97.6 F (36.4 C)-98.6 F (37 C)] 97.6 F (36.4 C) (01/10 0545) Pulse Rate:  [95-115] 95  (01/10 0545) Resp:  [16-19] 18  (01/10 0545) BP: (104-154)/(63-92) 124/69 mmHg (01/10 0545) SpO2:  [98 %-100 %] 100 % (01/10 0758) Last BM Date: 11/14/12  BM recorded yesterday and today, 600 thru NG yesterday, NPO and on TNA. Afebrile, VSS, no labs today  Intake/Output from previous day: 01/09 0701 - 01/10 0700 In: 324.3 [I.V.:324.3] Out: 2251 [Urine:1650; Emesis/NG output:600; Stool:1] Intake/Output this shift: Total I/O In: -  Out: 201 [Urine:200; Stool:1]  General appearance: alert, cooperative and no distress Resp: breath sounds down in the base. GI: soft, but distended, no bowel sounds, no reports of flatus, she did have a couple small BM's.  Lab Results:  No results found for this basename: WBC:2,HGB:2,HCT:2,PLT:2 in the last 72 hours  BMET  Basename 11/14/12 0350 11/13/12 0600  NA 136 137  K 4.1 4.0  CL 96 98  CO2 37* 36*  GLUCOSE 194* 146*  BUN 14 10  CREATININE 0.50 0.57  CALCIUM 9.2 9.1   PT/INR No results found for this basename: LABPROT:2,INR:2 in the last 72 hours   Lab 11/14/12 0350 11/12/12 0650  AST 15 12  ALT 19 19  ALKPHOS 71 52  BILITOT 0.3 0.3  PROT 5.0* 4.5*  ALBUMIN 2.0* 1.7*     Lipase  No results found for this basename: lipase     Studies/Results: Dg Abd Acute W/chest  11/14/2012  *RADIOLOGY REPORT*  Clinical Data: Postoperative ileus.  Nausea and abdominal distention.  ACUTE ABDOMEN SERIES (ABDOMEN 2 VIEW & CHEST 1 VIEW)  Comparison: 11/13/2012.  Findings: The upright chest x-ray demonstrates a stable NG tube. The right IJ catheter has been removed.  A right PICC line is in place.  Tip is in the mid SVC.  The cardiac silhouette, mediastinal and hilar contours  are within normal limits and stable.  The right lung base demonstrates streaky atelectasis.  No pleural effusions or pneumothorax.  Two views of the abdomen demonstrate mild persistent gaseous distention of the bowel.  Findings likely a postoperative ileus. No free air.  IMPRESSION: Persistent air distended bowel, most likely postoperative ileus.   Original Report Authenticated By: Rudie Meyer, M.D.     Medications:    . acetaminophen  1,000 mg Intravenous Q6H  . albuterol  2.5 mg Nebulization Q6H  . antiseptic oral rinse  15 mL Mouth Rinse QID  . budesonide  0.25 mg Nebulization QID  . chlorhexidine  15 mL Mouth Rinse BID  . heparin  5,000 Units Subcutaneous Q8H  . insulin aspart  0-9 Units Subcutaneous Q6H  . insulin glargine  10 Units Subcutaneous Daily  . lip balm  1 application Topical BID  . metoCLOPramide (REGLAN) injection  5 mg Intravenous Q6H  . pantoprazole (PROTONIX) IV  40 mg Intravenous Daily  . sodium chloride  10-40 mL Intracatheter Q12H  . tiotropium  18 mcg Inhalation Daily      . dextrose 5 % and 0.45 % NaCl with KCl 20 mEq/L 45 mL/hr at 11/15/12 0824  . fat emulsion    . TPN (CLINIMIX) +/- additives 55 mL/hr at 11/14/12 1741  . TPN (CLINIMIX) +/- additives      Assessment/Plan Small bowel obstruction s/p Diagnostic laparoscopy, exploratory  laparotomy, lysis of adhesions, 11/07/2012, Axel Filler, MD  Post op ileus  End stage COPD/ on Home O2 (24/7)  Lived independently prior to admit. Hypertension  Hyperlipidemia Hx of ovarian ca with abdominal hysterectomy  Post op anemia  PCM prealbumin is 6.9, on TNA  Plan:  She did have the opportunity to walk some yesterday, and is doing pulmonary toilet.  I was hoping to clamp her NG when I saw the BM's, but I still don't hear any bowel sounds.  Will continue to mobilize, pulmonary toilet and NG suction/bowel rest. TNA.       LOS: 17 days    [name] 11/15/2012

## 2012-11-15 NOTE — Progress Notes (Signed)
Physical Therapy Treatment Patient Details Name: [name] MRN: [id number] DOB: Dec 14, 1940 Today's Date: 11/15/2012 Time: 0102-7253 PT Time Calculation (min): 12 min  PT Assessment / Plan / Recommendation Comments on Treatment Session  Pt ambulated in hallway today and able to progress with distance also required less assist.  RN followed with recliner. Pt declined any exerices due to dyspnea and fatigue.    Follow Up Recommendations  SNF     Does the patient have the potential to tolerate intense rehabilitation     Barriers to Discharge        Equipment Recommendations  Other (comment) (rollator)    Recommendations for Other Services    Frequency     Plan Discharge plan remains appropriate;Frequency remains appropriate    Precautions / Restrictions Precautions Precautions: Fall Precaution Comments: legs weak for sit<>stand--will buckle, O2, NG suction   Pertinent Vitals/Pain No pain, reports dyspnea, on 2L O2 [name] throughout session    Mobility  Bed Mobility Supine to Sit: 4: Min assist;HOB elevated Details for Bed Mobility Assistance: assist to pull trunk upright Transfers Transfers: Sit to Stand;Stand to Sit Sit to Stand: 4: Min assist;With upper extremity assist;From chair/3-in-1 Stand to Sit: 4: Min assist;With upper extremity assist;To chair/3-in-1 Stand Pivot Transfers: 4: Min assist Details for Transfer Assistance: verbal cues for hand placement and safe technique, pt assisted from recliner back to bed with 2 HHA stand pivot Ambulation/Gait Ambulation/Gait Assistance: 4: Min guard Ambulation Distance (Feet): 75 Feet Assistive device: Rolling Reddish Ambulation/Gait Assistance Details: +2 for chair to follow, pt reports dyspnea 3/4 and ambulated on 2L [place] Gait Pattern: Step-through pattern;Trunk flexed;Decreased stride length Gait velocity: decreased    Exercises     PT Diagnosis:    PT Problem List:   PT Treatment Interventions:     PT Goals Acute  Rehab PT Goals PT Goal: Supine/Side to Sit - Progress: Progressing toward goal PT Goal: Sit to Stand - Progress: Progressing toward goal PT Goal: Stand to Sit - Progress: Progressing toward goal PT Goal: Ambulate - Progress: Progressing toward goal  Visit Information  Last PT Received On: 11/15/12 Assistance Needed: +2 (equipment, chair)    Subjective Data  Subjective: Thank you for helping me.   Cognition  Overall Cognitive Status: Appears within functional limits for tasks assessed/performed Behavior During Session: Anxious Cognition - Other Comments: pt anxious and particular about the set up of her bed, pillows, etc. Pt does not want the door or curtain closed.Marland KitchenMarland KitchenPt with claustrophobia     Balance     End of Session PT - End of Session Equipment Utilized During Treatment: Gait belt;Oxygen Activity Tolerance: Patient limited by fatigue Patient left: in bed;with call bell/phone within reach;with nursing in room   GP     [facility] 11/15/2012, 3:59 PM Pager: [phone]

## 2012-11-16 DIAGNOSIS — Z8679 Personal history of other diseases of the circulatory system: Secondary | ICD-10-CM

## 2012-11-16 LAB — BASIC METABOLIC PANEL
BUN: 15 mg/dL (ref 6–23)
Calcium: 9 mg/dL (ref 8.4–10.5)
Creatinine, Ser: 0.44 mg/dL — ABNORMAL LOW (ref 0.50–1.10)
GFR calc Af Amer: >90 mL/min (ref >90)
GFR calc non Af Amer: >90 mL/min (ref >90)

## 2012-11-16 LAB — CBC
HCT: 25.8 % — ABNORMAL LOW (ref 36.0–46.0)
MCHC: 31.8 g/dL (ref 30.0–36.0)
Platelets: 394 10*3/uL (ref 150–400)
RDW: 13.2 % (ref 11.5–15.5)

## 2012-11-16 LAB — GLUCOSE, CAPILLARY: Glucose-Capillary: 150 mg/dL — ABNORMAL HIGH (ref 70–99)

## 2012-11-16 MED ORDER — CLINIMIX E/DEXTROSE (5/20) 5 % IV SOLN
INTRAVENOUS | Status: AC
  Administered 2012-11-16: 17:00:00 via INTRAVENOUS
  Filled 2012-11-16: qty 2000

## 2012-11-16 NOTE — Progress Notes (Signed)
RN walked pt in hall way with  assistance from nurse aide. Pt was very unsteady on her feet and almost fell. RN will continue to monitor pt and advices that PT do another evaluation on pt to determine Pt's base line.

## 2012-11-16 NOTE — Progress Notes (Signed)
Patient ID: [name], female   DOB: [birth year], 72 y.o.   MRN: [id number] [facility] Progress Note:   9 Days Post-Op  Subjective: Mental status is fairly clear.  She reports 4 bowel movements.  NG in place Objective: Vital signs in last 24 hours: Temp:  [98 F (36.7 C)-98.4 F (36.9 C)] 98 F (36.7 C) (01/11 0616) Pulse Rate:  [87-99] 87  (01/11 0616) Resp:  [17-18] 18  (01/11 0616) BP: (106-130)/(65-72) 106/65 mmHg (01/11 0616) SpO2:  [97 %-100 %] 97 % (01/11 0803)  Intake/Output from previous day: 01/10 0701 - 01/11 0700 In: 1059 [I.V.:1059] Out: 2052 [Urine:1550; Emesis/NG output:500; Stool:2] Intake/Output this shift:    Physical Exam: Work of breathing is mildly increased.  Incision nonerythematous.    Lab Results:  Results for orders placed during the hospital encounter of 10/29/12 (from the past 48 hour(s))  GLUCOSE, CAPILLARY     Status: Abnormal   Collection Time   11/14/12 12:15 PM      Component Value Range Comment   Glucose-Capillary 159 (*) 70 - 99 mg/dL   GLUCOSE, CAPILLARY     Status: Abnormal   Collection Time   11/14/12  6:01 PM      Component Value Range Comment   Glucose-Capillary 205 (*) 70 - 99 mg/dL   GLUCOSE, CAPILLARY     Status: Abnormal   Collection Time   11/14/12 11:44 PM      Component Value Range Comment   Glucose-Capillary 195 (*) 70 - 99 mg/dL    Comment 1 Notify RN     GLUCOSE, CAPILLARY     Status: Abnormal   Collection Time   11/15/12  5:39 AM      Component Value Range Comment   Glucose-Capillary 179 (*) 70 - 99 mg/dL   GLUCOSE, CAPILLARY     Status: Abnormal   Collection Time   11/15/12 12:03 PM      Component Value Range Comment   Glucose-Capillary 176 (*) 70 - 99 mg/dL   GLUCOSE, CAPILLARY     Status: Normal   Collection Time   11/15/12  5:34 PM      Component Value Range Comment   Glucose-Capillary 83  70 - 99 mg/dL   GLUCOSE, CAPILLARY     Status: Abnormal   Collection Time   11/16/12 12:12 AM      Component  Value Range Comment   Glucose-Capillary 150 (*) 70 - 99 mg/dL    Comment 1 Notify RN     BASIC METABOLIC PANEL     Status: Abnormal   Collection Time   11/16/12  5:10 AM      Component Value Range Comment   Sodium 135  135 - 145 mEq/L    Potassium 3.9  3.5 - 5.1 mEq/L    Chloride 94 (*) 96 - 112 mEq/L    CO2 40 (*) 19 - 32 mEq/L    Glucose, Bld 173 (*) 70 - 99 mg/dL    BUN 15  6 - 23 mg/dL    Creatinine, Ser 0.86 (*) 0.50 - 1.10 mg/dL    Calcium 9.0  8.4 - 57.8 mg/dL    GFR calc non Af Amer >90  >90 mL/min    GFR calc Af Amer >90  >90 mL/min   CBC     Status: Abnormal   Collection Time   11/16/12  5:10 AM      Component Value Range Comment   WBC 10.1  4.0 -  10.5 K/uL    RBC 2.62 (*) 3.87 - 5.11 MIL/uL    Hemoglobin 8.2 (*) 12.0 - 15.0 g/dL    HCT 40.9 (*) 81.1 - 46.0 %    MCV 98.5  78.0 - 100.0 fL    MCH 31.3  26.0 - 34.0 pg    MCHC 31.8  30.0 - 36.0 g/dL    RDW 91.4  78.2 - 95.6 %    Platelets 394  150 - 400 K/uL   GLUCOSE, CAPILLARY     Status: Abnormal   Collection Time   11/16/12  6:18 AM      Component Value Range Comment   Glucose-Capillary 188 (*) 70 - 99 mg/dL     Radiology/Results: Dg Abd Acute W/chest  11/14/2012  *RADIOLOGY REPORT*  Clinical Data: Postoperative ileus.  Nausea and abdominal distention.  ACUTE ABDOMEN SERIES (ABDOMEN 2 VIEW & CHEST 1 VIEW)  Comparison: 11/13/2012.  Findings: The upright chest x-ray demonstrates a stable NG tube. The right IJ catheter has been removed.  A right PICC line is in place.  Tip is in the mid SVC.  The cardiac silhouette, mediastinal and hilar contours are within normal limits and stable.  The right lung base demonstrates streaky atelectasis.  No pleural effusions or pneumothorax.  Two views of the abdomen demonstrate mild persistent gaseous distention of the bowel.  Findings likely a postoperative ileus. No free air.  IMPRESSION: Persistent air distended bowel, most likely postoperative ileus.   Original Report Authenticated By:  Rudie Meyer, M.D.     Anti-infectives: Anti-infectives     Start     Dose/Rate Route Frequency Ordered Stop   11/09/12 0000   vancomycin (VANCOCIN) 750 mg in sodium chloride 0.9 % 150 mL IVPB  Status:  Discontinued        750 mg 150 mL/hr over 60 Minutes Intravenous Every 12 hours 11/08/12 1023 11/09/12 1152   11/08/12 1200  piperacillin-tazobactam (ZOSYN) IVPB 3.375 g       3.375 g 12.5 mL/hr over 240 Minutes Intravenous Every 8 hours 11/08/12 1023 11/15/12 0730   11/08/12 1100   vancomycin (VANCOCIN) IVPB 1000 mg/200 mL premix        1,000 mg 200 mL/hr over 60 Minutes Intravenous  Once 11/08/12 1023 11/08/12 1322   11/04/12 1200   levofloxacin (LEVAQUIN) IVPB 500 mg  Status:  Discontinued        500 mg 100 mL/hr over 60 Minutes Intravenous Daily 11/04/12 1124 11/05/12 1436   11/01/12 1615   fluconazole (DIFLUCAN) tablet 200 mg        200 mg Oral  Once 11/01/12 1607 11/01/12 1636   11/01/12 1000   levofloxacin (LEVAQUIN) IVPB 750 mg  Status:  Discontinued        750 mg 100 mL/hr over 90 Minutes Intravenous Every 48 hours 10/30/12 1108 10/31/12 1428   11/01/12 1000   levofloxacin (LEVAQUIN) tablet 750 mg  Status:  Discontinued        750 mg Oral Every 48 hours 10/31/12 1428 11/04/12 1124   10/29/12 1045   levofloxacin (LEVAQUIN) IVPB 750 mg  Status:  Discontinued        750 mg 100 mL/hr over 90 Minutes Intravenous Every 24 hours 10/29/12 1034 10/30/12 1108          Assessment/Plan: Problem List: Patient Active Problem List  Diagnosis  . EMPHYSEMA  . Anxiety  . Anemia  . SBO (small bowel obstruction)  . Emphysema, severe  . Respiratory failure,  post-operative  . Status post laparotomy  . History of ovarian cancer, s/p remote resection  . H/O: hypertension  . H/O hyperlipidemia  . Fever  . Leukocytosis  . HAP (hospital-acquired pneumonia)    Ileus resolving.  Hopeful removal of NG tube soon. 9 Days Post-Op    LOS: 18 days   Matt B. Daphine Deutscher, MD,  [facility] [phone] beeper [phone]  11/16/2012 9:03 AM

## 2012-11-16 NOTE — Progress Notes (Signed)
Pt profile: 72 y/o wf admitted by [facility] 12/24 with dx of AECOP and treated accordingly with improvement in respiratory symptoms. She developed progressive abdominal distention with KUB and CT abd c/w SBO. Underwent exploratory laparotomy, lysis of adhesions 1/02. Returned to ICU on vent. PCCM asked to assist with vent/ICU mgmt   Lines, Tubes, etc: ETT 1/02 >> 1/03 R IJ CVL 1/02 >>   Microbiology: MRSA PCR 1/02 >> NEG 1/3 BC x 2 >>not collected 1/3 UC >> NEG  Antibiotics:  vanco (empiric, fever) 1/3 >> 1/04 Zosyn (empiric, fever) 1/3 >> 1/10 (stop date ordered)   Studies/Events: 12/31 CT abd: Small bowel obstruction with dilated small bowel to the level of the distal ileum in the region of the pelvis where there is abrupt change of caliber which may be related to adhesions 1/2 Hypotension post op >pressor support  1/3 low grade fever/elevated WBC >pan cx  1/9 - ileus continues, n/v with NGT clamped, stable but tenuous respiratory status   Consults:  CCS 12/30  Best Practice: DVT: SQ hep SUP: PPI Nutrition: none Glycemic control: SSI Sedation/analgesia: PRN fentanyl   [name]:  Pt had episode yesterday where she became sob after returning from her walk, and tells me her sats were in the 60's??  She settled down quickly, and returned to baseline.  Today she feels ok, with no increased wob, but gets very winded with any activity.  Still requiring NG.   Objective:  Filed Vitals:   11/15/12 1932 11/15/12 2050 11/16/12 0616 11/16/12 0803  BP:  117/67 106/65   Pulse:  99 87   Temp:  98.4 F (36.9 C) 98 F (36.7 C)   TempSrc:  Oral Oral   Resp:  18 18   Height:      Weight:      SpO2: 99% 100% 100% 97%   Gen: wd female, nad.   HEENT: NGT in place, op clear Neck: no JVD or LN Chest: decreased bs, no wheezing, a few basilar crackles. Cardiac: RRR, no MRG Abd: hypoactive, surgical incision midline c/d/i, staples intact Ext:no edema or cyanosis Neuro: a/o x 3 , moves all 4.     BMET  Lab 11/16/12 0510 11/14/12 0350 11/13/12 0600 11/12/12 0650 11/11/12 1300  NA 135 136 137 137 136  K 3.9 4.1 -- -- --  CL 94* 96 98 99 101  CO2 40* 37* 36* 34* 30  GLUCOSE 173* 194* 146* 226* 166*  BUN 15 14 10  5* 4*  CREATININE 0.44* 0.50 0.57 0.50 0.54  CALCIUM 9.0 9.2 9.1 8.5 8.7  MG -- 1.7 -- 1.6 1.6  PHOS -- 2.9 -- 3.0 2.1*   CBC  Lab 11/16/12 0510 11/12/12 0650 11/10/12 0400  HGB 8.2* 8.2* 9.1*  HCT 25.8* 24.1* 27.0*  WBC 10.1 9.3 14.8*  PLT 394 284 264   CXR:  1/6> IMPRESSION: Continued improvement in right basilar airspace disease. No new abnormality.     IMPRESSION:  SBO (small bowel obstruction) - Status post laparotomy per CCS.  NGT remains to suction.  Nausea 1/9 with clamping.  Plan: -rec's per Surgery -TPN per pharmacy -trend BMP   Post-operative respiratory failure - Resolved.  Emphysema, severe - No acute bronchospasm or increased wob at rest.   Plan: -aggressive pulmonary hygiene IS/Flutter -PRN CXR -pulmicort, BD's -Note that Bicarb level is 40- may want to consider Diamox rx   Nutrition - DM On TPN  BS 170-190 on Lantus + SSI   Leukocytosis - fever 1/03,  vague RLL opacity 1/04, mildly elevated PCT.  HAP vs abdominal source (less likely).   Plan: -Continue zosyn with stop date of 1/10   Anemia - in setting of chronic disease / acute critical illness.  No indication for transfusion.  Plan: -f/u cbc in am   Janeece Blok M. Kriste Basque, MD 11/16/12 @ 2:56PM

## 2012-11-16 NOTE — Progress Notes (Signed)
LAB CALLED CO2 40 FROM LAB WORK THIS AM , CALLED RESP TO ASSESS , PT IN NO DISTRESS, OXYGEN AT 2L, Caillou Minus,RN

## 2012-11-16 NOTE — Progress Notes (Signed)
PARENTERAL NUTRITION CONSULT NOTE - Follow-up  Pharmacy Consult for TNA Indication: SBO  Allergies  Allergen Reactions  . Oxycodone-Acetaminophen     REACTION: headaches,nausea   Patient Measurements: Height: 5\' 5"  (165.1 cm) Weight: 114 lb 6.7 oz (51.9 kg) IBW/kg (Calculated) : 57  Usual Weight: 43 kg  Vital Signs: Temp: 98 F (36.7 C) (01/11 0616) Temp src: Oral (01/11 0616) BP: 106/65 mmHg (01/11 0616) Pulse Rate: 87  (01/11 0616) Intake/Output from previous day: 01/10 0701 - 01/11 0700 In: 1059 [I.V.:1059] Out: 2052 [Urine:1550; Emesis/NG output:500; Stool:2] Intake/Output from this shift:    Labs:  [facility] 11/16/12 0510  WBC 10.1  HGB 8.2*  HCT 25.8*  PLT 394  APTT --  INR --    Basename 11/16/12 0510 11/14/12 0350  NA 135 136  K 3.9 4.1  CL 94* 96  CO2 40* 37*  GLUCOSE 173* 194*  BUN 15 14  CREATININE 0.44* 0.50  LABCREA -- --  CREAT24HRUR -- --  CALCIUM 9.0 9.2  MG -- 1.7  PHOS -- 2.9  PROT -- 5.0*  ALBUMIN -- 2.0*  AST -- 15  ALT -- 19  ALKPHOS -- 71  BILITOT -- 0.3  BILIDIR -- --  IBILI -- --  PREALBUMIN -- --  TRIG -- --  CHOLHDL -- --  CHOL -- --   Estimated Creatinine Clearance: 52.8 ml/min (by C-G formula based on Cr of 0.44).   Basename 11/16/12 0618 11/16/12 0012 11/15/12 1734  GLUCAP 188* 150* 83   Medications:  Scheduled:     . [COMPLETED] acetaminophen  1,000 mg Intravenous Q6H  . albuterol  2.5 mg Nebulization Q6H  . antiseptic oral rinse  15 mL Mouth Rinse QID  . budesonide  0.25 mg Nebulization QID  . chlorhexidine  15 mL Mouth Rinse BID  . heparin  5,000 Units Subcutaneous Q8H  . insulin aspart  0-9 Units Subcutaneous Q6H  . insulin glargine  10 Units Subcutaneous Daily  . lip balm  1 application Topical BID  . metoCLOPramide (REGLAN) injection  5 mg Intravenous Q6H  . pantoprazole (PROTONIX) IV  40 mg Intravenous Daily  . sodium chloride  10-40 mL Intracatheter Q12H  . tiotropium  18 mcg Inhalation Daily    Infusions:     . dextrose 5 % and 0.45 % NaCl with KCl 20 mEq/L 45 mL/hr (11/16/12 0532)  . fat emulsion 240 mL (11/15/12 1712)  . [EXPIRED] TPN (CLINIMIX) +/- additives 55 mL/hr at 11/14/12 1741  . TPN (CLINIMIX) +/- additives 55 mL/hr at 11/15/12 1712   Insulin Requirements in the past 24 hours:  no hx of DM; q6hr CBG with sensitive scale. CBG 188 this am 3 units of insulin/24 hours since new TNA bag hung Lantus 10 units at bedtime  Current Nutrition:  NPO  TNA @ 55 ml/hr Lipids @ 10 ml/hr MWF ONLY  Fluids: MIVF: D5 1/2 NS with 20 mEQ K+ @ 45 ml/hr  Assessment: 71 yoF admit 12/24 with COPD exacerbation. Abdominal distention, surgery 1/2 for SBO.  Electrolytes:  WNL (1/10)   CBG: elevated >150  LFTs: WNL (1/10)  Prealbumin: 6.9 (1/7)  TGs: 129 (1/7)  On Reglan 5mg  IV q6hr, has had small BM's but abd remains distended, nausea with NG tube clamping.  Nutritional Goals: per RD 1500-1750 kCal, 70-90 grams of protein per day Clinimix E 5/20 at goal rate of 70 ml/hr would deliver 84 gm protein, avg 1680 kcal/day.  Plan:   Continue CBG q6h, sensitive  scale.  Continue TNA at 55 ml/hr - will plan to advance when CBG normalize  Continue mIVF at 45 ml/hr  TNA lab panel Mon/Thurs  Lipids, MVI and trace elements MWF only due to national back order.  Otho Bellows PharmD [phone] 11/16/2012 7:56 AM

## 2012-11-17 LAB — GLUCOSE, CAPILLARY: Glucose-Capillary: 158 mg/dL — ABNORMAL HIGH (ref 70–99)

## 2012-11-17 MED ORDER — BISACODYL 10 MG RE SUPP: 10.0000 mg | Freq: Once | RECTAL | Status: DC

## 2012-11-17 MED ORDER — ALPRAZOLAM 0.25 MG PO TABS
0.2500 mg | ORAL_TABLET | Freq: Two times a day (BID) | ORAL | Status: DC | PRN
  Administered 2012-11-17: 0.25 mg via ORAL
  Filled 2012-11-17: qty 1

## 2012-11-17 MED ORDER — CLINIMIX E/DEXTROSE (5/20) 5 % IV SOLN
INTRAVENOUS | Status: AC
  Administered 2012-11-17: 18:00:00 via INTRAVENOUS
  Filled 2012-11-17: qty 2000

## 2012-11-17 MED ORDER — LORAZEPAM 2 MG/ML IJ SOLN
1.0000 mg | Freq: Four times a day (QID) | INTRAMUSCULAR | Status: DC | PRN
  Administered 2012-11-17 – 2012-11-18 (×2): 1 mg via INTRAVENOUS
  Filled 2012-11-17 (×2): qty 1

## 2012-11-17 NOTE — Progress Notes (Signed)
PARENTERAL NUTRITION CONSULT NOTE - Follow-up  Pharmacy Consult for TNA Indication: SBO  Allergies  Allergen Reactions  . Oxycodone-Acetaminophen     REACTION: headaches,nausea   Patient Measurements: Height: 5\' 5"  (165.1 cm) Weight: 114 lb 6.7 oz (51.9 kg) IBW/kg (Calculated) : 57  Usual Weight: 43 kg  Vital Signs: Temp: 98.3 F (36.8 C) (01/12 0507) Temp src: Oral (01/12 0507) BP: 124/58 mmHg (01/12 0507) Pulse Rate: 86  (01/12 0507) Intake/Output from previous day: 01/11 0701 - 01/12 0700 In: 0  Out: 2400 [Urine:1750; Emesis/NG output:650] Intake/Output from this shift:    Labs:  [facility] 11/16/12 0510  WBC 10.1  HGB 8.2*  HCT 25.8*  PLT 394  APTT --  INR --    Basename 11/16/12 0510  NA 135  K 3.9  CL 94*  CO2 40*  GLUCOSE 173*  BUN 15  CREATININE 0.44*  LABCREA --  CREAT24HRUR --  CALCIUM 9.0  MG --  PHOS --  PROT --  ALBUMIN --  AST --  ALT --  ALKPHOS --  BILITOT --  BILIDIR --  IBILI --  PREALBUMIN --  TRIG --  CHOLHDL --  CHOL --   Estimated Creatinine Clearance: 52.8 ml/min (by C-G formula based on Cr of 0.44).   Basename 11/17/12 0604 11/17/12 0003 11/16/12 1743  GLUCAP 155* 138* 127*   Medications:  Scheduled:     . albuterol  2.5 mg Nebulization Q6H  . antiseptic oral rinse  15 mL Mouth Rinse QID  . budesonide  0.25 mg Nebulization QID  . chlorhexidine  15 mL Mouth Rinse BID  . heparin  5,000 Units Subcutaneous Q8H  . insulin aspart  0-9 Units Subcutaneous Q6H  . insulin glargine  10 Units Subcutaneous Daily  . lip balm  1 application Topical BID  . metoCLOPramide (REGLAN) injection  5 mg Intravenous Q6H  . pantoprazole (PROTONIX) IV  40 mg Intravenous Daily  . sodium chloride  10-40 mL Intracatheter Q12H  . tiotropium  18 mcg Inhalation Daily   Infusions:     . dextrose 5 % and 0.45 % NaCl with KCl 20 mEq/L 45 mL/hr at 11/17/12 0405  . [EXPIRED] fat emulsion 240 mL (11/15/12 1712)  . [EXPIRED] TPN (CLINIMIX)  +/- additives 55 mL/hr at 11/15/12 1712  . TPN (CLINIMIX) +/- additives 55 mL/hr at 11/16/12 1728   Insulin Requirements in the past 24 hours:  no hx of DM; q6hr CBG with sensitive scale. CBG 155 this am 4 units of insulin/24 hours since new TNA bag hung Lantus 10 units at bedtime  Current Nutrition:  NPO  TNA @ 55 ml/hr Lipids @ 10 ml/hr MWF ONLY  Fluids: MIVF: D5 1/2 NS with 20 mEQ K+ @ 45 ml/hr  Assessment: 71 yoF admit 12/24 with COPD exacerbation. Abdominal distention, surgery 1/2 for SBO.  Electrolytes:  WNL (1/11)   CBG: improved   LFTs: WNL (1/10)  Prealbumin: 6.9 (1/7)  TGs: 129 (1/7)  On Reglan 5mg  IV q6hr, has had small BM's but abd remains distended, nausea with NG tube clamped or unclamped  Nutritional Goals: per RD 1500-1750 kCal, 70-90 grams of protein per day Clinimix E 5/20 at goal rate of 70 ml/hr would deliver 84 gm protein, avg 1680 kcal/day.  Plan:   Continue CBG q6h, sensitive scale.  Continue TNA at 55 ml/hr - will plan to advance when CBG decrease further  Continue mIVF at 45 ml/hr  TNA lab panel Mon/Thurs  Lipids, MVI and trace  elements MWF only due to national back order.  Otho Bellows PharmD [phone] 11/17/2012 8:47 AM

## 2012-11-17 NOTE — Progress Notes (Signed)
Pt profile: 72 y/o wf admitted by [facility] 12/24 with dx of AECOP and treated accordingly with improvement in respiratory symptoms. She developed progressive abdominal distention with KUB and CT abd c/w SBO. Underwent exploratory laparotomy, lysis of adhesions 1/02. Returned to ICU on vent. PCCM asked to assist with vent/ICU mgmt   Lines, Tubes, etc: ETT 1/02 >> 1/03 R IJ CVL 1/02 >>   Microbiology: MRSA PCR 1/02 >> NEG 1/3 BC x 2 >>not collected 1/3 UC >> NEG  Antibiotics:  vanco (empiric, fever) 1/3 >> 1/04 Zosyn (empiric, fever) 1/3 >> 1/10 (stop date ordered)   Studies/Events: 12/31 CT abd: Small bowel obstruction with dilated small bowel to the level of the distal ileum in the region of the pelvis where there is abrupt change of caliber which may be related to adhesions 1/2 Hypotension post op >pressor support  1/3 low grade fever/elevated WBC >pan cx  1/9 - ileus continues, n/v with NGT clamped, stable but tenuous respiratory status   Consults:  CCS 12/30  Best Practice: DVT: SQ hep SUP: PPI Nutrition: none Glycemic control: SSI Sedation/analgesia: PRN fentanyl   [name]: Today she feels ok, with no increased wob, but gets very winded with any activity. Still requiring NG.   Objective:  Filed Vitals:   11/16/12 2030 11/17/12 0143 11/17/12 0507 11/17/12 0754  BP: 146/72  124/58   Pulse: 109  86   Temp: 98.3 F (36.8 C)  98.3 F (36.8 C)   TempSrc: Oral  Oral   Resp: 20  18   Height:      Weight:      SpO2: 100% 99% 100% 98%   Gen: wd female, nad.   HEENT: NGT in place, op clear Neck: no JVD or LN Chest: decreased bs, no wheezing, a few basilar crackles. Cardiac: RRR, no MRG Abd: hypoactive, surgical incision midline c/d/i, staples intact Ext:no edema or cyanosis Neuro: a/o x 3 , moves all 4.   BMET  Lab 11/16/12 0510 11/14/12 0350 11/13/12 0600 11/12/12 0650 11/11/12 1300  NA 135 136 137 137 136  K 3.9 4.1 -- -- --  CL 94* 96 98 99 101  CO2 40* 37* 36*  34* 30  GLUCOSE 173* 194* 146* 226* 166*  BUN 15 14 10  5* 4*  CREATININE 0.44* 0.50 0.57 0.50 0.54  CALCIUM 9.0 9.2 9.1 8.5 8.7  MG -- 1.7 -- 1.6 1.6  PHOS -- 2.9 -- 3.0 2.1*   CBC  Lab 11/16/12 0510 11/12/12 0650  HGB 8.2* 8.2*  HCT 25.8* 24.1*  WBC 10.1 9.3  PLT 394 284   CXR:  1/6> IMPRESSION: Continued improvement in right basilar airspace disease. No new abnormality.     IMPRESSION:  SBO (small bowel obstruction) - Status post laparotomy per CCS.  NGT remains to suction.  Nausea 1/9 with clamping.  Plan: -rec's per Surgery- she is up walking in hall today -TPN per pharmacy -trend BMP   Post-operative respiratory failure - Resolved.  Emphysema, severe - No acute bronchospasm or increased wob at rest.   Plan: -aggressive pulmonary hygiene IS/Flutter -PRN CXR -pulmicort, BD's -Note that Bicarb level is 40- may want to consider Diamox rx   Nutrition - DM On TPN  BS 170-190 on Lantus + SSI   Leukocytosis - fever 1/03, vague RLL opacity 1/04, mildly elevated PCT.  HAP vs abdominal source (less likely).   Plan: -Continue zosyn with stop date of 1/10   Anemia - in setting of chronic disease /  acute critical illness.  No indication for transfusion.  Plan: -trend CBC   Jessica Sando M. Kriste Basque, MD 11/17/12 @ 1:28PM

## 2012-11-17 NOTE — Progress Notes (Signed)
Patient ID: [name], female   DOB: Apr 20, 1941, 72 y.o.   MRN: [id number] [facility] Surgery Progress Note:   10 Days Post-Op  Subjective: Mental status is clear but nervous.  Asked about restarting her xanax Objective: Vital signs in last 24 hours: Temp:  [98.3 F (36.8 C)-98.5 F (36.9 C)] 98.3 F (36.8 C) (01/12 0507) Pulse Rate:  [86-110] 86  (01/12 0507) Resp:  [18-20] 18  (01/12 0507) BP: (124-156)/(58-73) 124/58 mmHg (01/12 0507) SpO2:  [97 %-100 %] 98 % (01/12 0754)  Intake/Output from previous day: 01/11 0701 - 01/12 0700 In: 0  Out: 2400 [Urine:1750; Emesis/NG output:650] Intake/Output this shift:    Physical Exam: Work of breathing is ok.  Worried since she hasn't had any more BMs.  Incision is bland with staples in place.  Lab Results:  Results for orders placed during the hospital encounter of 10/29/12 (from the past 48 hour(s))  GLUCOSE, CAPILLARY     Status: Abnormal   Collection Time   11/15/12 12:03 PM      Component Value Range Comment   Glucose-Capillary 176 (*) 70 - 99 mg/dL   GLUCOSE, CAPILLARY     Status: Normal   Collection Time   11/15/12  5:34 PM      Component Value Range Comment   Glucose-Capillary 83  70 - 99 mg/dL   GLUCOSE, CAPILLARY     Status: Abnormal   Collection Time   11/16/12 12:12 AM      Component Value Range Comment   Glucose-Capillary 150 (*) 70 - 99 mg/dL    Comment 1 Notify RN     BASIC METABOLIC PANEL     Status: Abnormal   Collection Time   11/16/12  5:10 AM      Component Value Range Comment   Sodium 135  135 - 145 mEq/L    Potassium 3.9  3.5 - 5.1 mEq/L    Chloride 94 (*) 96 - 112 mEq/L    CO2 40 (*) 19 - 32 mEq/L    Glucose, Bld 173 (*) 70 - 99 mg/dL    BUN 15  6 - 23 mg/dL    Creatinine, Ser 4.09 (*) 0.50 - 1.10 mg/dL    Calcium 9.0  8.4 - 81.1 mg/dL    GFR calc non Af Amer >90  >90 mL/min    GFR calc Af Amer >90  >90 mL/min   CBC     Status: Abnormal   Collection Time   11/16/12  5:10 AM      Component  Value Range Comment   WBC 10.1  4.0 - 10.5 K/uL    RBC 2.62 (*) 3.87 - 5.11 MIL/uL    Hemoglobin 8.2 (*) 12.0 - 15.0 g/dL    HCT 91.4 (*) 78.2 - 46.0 %    MCV 98.5  78.0 - 100.0 fL    MCH 31.3  26.0 - 34.0 pg    MCHC 31.8  30.0 - 36.0 g/dL    RDW 95.6  21.3 - 08.6 %    Platelets 394  150 - 400 K/uL   GLUCOSE, CAPILLARY     Status: Abnormal   Collection Time   11/16/12  6:18 AM      Component Value Range Comment   Glucose-Capillary 188 (*) 70 - 99 mg/dL   GLUCOSE, CAPILLARY     Status: Abnormal   Collection Time   11/16/12 11:46 AM      Component Value Range Comment   Glucose-Capillary  160 (*) 70 - 99 mg/dL    Comment 1 Documented in Chart      Comment 2 Notify RN     GLUCOSE, CAPILLARY     Status: Abnormal   Collection Time   11/16/12  5:43 PM      Component Value Range Comment   Glucose-Capillary 127 (*) 70 - 99 mg/dL    Comment 1 Documented in Chart      Comment 2 Notify RN     GLUCOSE, CAPILLARY     Status: Abnormal   Collection Time   11/17/12 12:03 AM      Component Value Range Comment   Glucose-Capillary 138 (*) 70 - 99 mg/dL   GLUCOSE, CAPILLARY     Status: Abnormal   Collection Time   11/17/12  6:04 AM      Component Value Range Comment   Glucose-Capillary 155 (*) 70 - 99 mg/dL     Radiology/Results: No results found.  Anti-infectives: Anti-infectives     Start     Dose/Rate Route Frequency Ordered Stop   11/09/12 0000   vancomycin (VANCOCIN) 750 mg in sodium chloride 0.9 % 150 mL IVPB  Status:  Discontinued        750 mg 150 mL/hr over 60 Minutes Intravenous Every 12 hours 11/08/12 1023 11/09/12 1152   11/08/12 1200  piperacillin-tazobactam (ZOSYN) IVPB 3.375 g       3.375 g 12.5 mL/hr over 240 Minutes Intravenous Every 8 hours 11/08/12 1023 11/15/12 0730   11/08/12 1100   vancomycin (VANCOCIN) IVPB 1000 mg/200 mL premix        1,000 mg 200 mL/hr over 60 Minutes Intravenous  Once 11/08/12 1023 11/08/12 1322   11/04/12 1200   levofloxacin (LEVAQUIN) IVPB  500 mg  Status:  Discontinued        500 mg 100 mL/hr over 60 Minutes Intravenous Daily 11/04/12 1124 11/05/12 1436   11/01/12 1615   fluconazole (DIFLUCAN) tablet 200 mg        200 mg Oral  Once 11/01/12 1607 11/01/12 1636   11/01/12 1000   levofloxacin (LEVAQUIN) IVPB 750 mg  Status:  Discontinued        750 mg 100 mL/hr over 90 Minutes Intravenous Every 48 hours 10/30/12 1108 10/31/12 1428   11/01/12 1000   levofloxacin (LEVAQUIN) tablet 750 mg  Status:  Discontinued        750 mg Oral Every 48 hours 10/31/12 1428 11/04/12 1124   10/29/12 1045   levofloxacin (LEVAQUIN) IVPB 750 mg  Status:  Discontinued        750 mg 100 mL/hr over 90 Minutes Intravenous Every 24 hours 10/29/12 1034 10/30/12 1108          Assessment/Plan: Problem List: Patient Active Problem List  Diagnosis  . EMPHYSEMA  . Anxiety  . Anemia  . SBO (small bowel obstruction)  . Emphysema, severe  . Respiratory failure, post-operative  . Status post laparotomy  . History of ovarian cancer, s/p remote resection  . H/O: hypertension  . H/O hyperlipidemia  . Fever  . Leukocytosis  . HAP (hospital-acquired pneumonia)    Will give Dulcolax and oral Xanax (clamp NG).   10 Days Post-Op    LOS: 19 days   Matt B. Daphine Deutscher, MD, [facility] [phone] beeper [phone]  11/17/2012 9:46 AM

## 2012-11-18 ENCOUNTER — Inpatient Hospital Stay (HOSPITAL_COMMUNITY): Payer: Medicare Other

## 2012-11-18 LAB — GLUCOSE, CAPILLARY: Glucose-Capillary: 168 mg/dL — ABNORMAL HIGH (ref 70–99)

## 2012-11-18 LAB — DIFFERENTIAL
Basophils Relative: 0 % (ref 0–1)
Eosinophils Absolute: 0.3 10*3/uL (ref 0.0–0.7)
Neutrophils Relative %: 74 % (ref 43–77)

## 2012-11-18 LAB — COMPREHENSIVE METABOLIC PANEL
Alkaline Phosphatase: 110 U/L (ref 39–117)
BUN: 16 mg/dL (ref 6–23)
Chloride: 93 mEq/L — ABNORMAL LOW (ref 96–112)
GFR calc Af Amer: >90 mL/min (ref >90)
Glucose, Bld: 132 mg/dL — ABNORMAL HIGH (ref 70–99)
Potassium: 4.3 mEq/L (ref 3.5–5.1)
Total Bilirubin: 0.4 mg/dL (ref 0.3–1.2)
Total Protein: 4.9 g/dL — ABNORMAL LOW (ref 6.0–8.3)

## 2012-11-18 LAB — MAGNESIUM: Magnesium: 2 mg/dL (ref 1.5–2.5)

## 2012-11-18 LAB — TRIGLYCERIDES: Triglycerides: 105 mg/dL (ref <150)

## 2012-11-18 LAB — CBC
MCH: 31.1 pg (ref 26.0–34.0)
MCHC: 31.2 g/dL (ref 30.0–36.0)
Platelets: 440 10*3/uL — ABNORMAL HIGH (ref 150–400)
RDW: 13.5 % (ref 11.5–15.5)

## 2012-11-18 LAB — CHOLESTEROL, TOTAL: Cholesterol: 108 mg/dL (ref 0–200)

## 2012-11-18 MED ORDER — LORAZEPAM 2 MG/ML IJ SOLN
0.5000 mg | Freq: Three times a day (TID) | INTRAMUSCULAR | Status: DC | PRN
  Administered 2012-11-18 – 2012-11-21 (×7): 0.5 mg via INTRAVENOUS
  Filled 2012-11-18 (×7): qty 1

## 2012-11-18 MED ORDER — FAT EMULSION 20 % IV EMUL
250.0000 mL | INTRAVENOUS | Status: AC
  Administered 2012-11-18: 250 mL via INTRAVENOUS
  Filled 2012-11-18: qty 250

## 2012-11-18 MED ORDER — KCL IN DEXTROSE-NACL 20-5-0.45 MEQ/L-%-% IV SOLN
INTRAVENOUS | Status: DC
  Administered 2012-11-18: 18:00:00 via INTRAVENOUS
  Administered 2012-11-20: 30 mL/h via INTRAVENOUS
  Administered 2012-11-21 – 2012-11-22 (×2): via INTRAVENOUS
  Filled 2012-11-18 (×5): qty 1000

## 2012-11-18 MED ORDER — METOCLOPRAMIDE HCL 5 MG/ML IJ SOLN
5.0000 mg | Freq: Four times a day (QID) | INTRAMUSCULAR | Status: DC | PRN
  Administered 2012-11-22 – 2012-11-28 (×2): 5 mg via INTRAVENOUS
  Filled 2012-11-18 (×2): qty 2

## 2012-11-18 MED ORDER — TRACE MINERALS CR-CU-F-FE-I-MN-MO-SE-ZN IV SOLN
INTRAVENOUS | Status: AC
  Administered 2012-11-18: 17:00:00 via INTRAVENOUS
  Filled 2012-11-18: qty 2000

## 2012-11-18 MED ORDER — BISACODYL 10 MG RE SUPP
10.0000 mg | Freq: Every day | RECTAL | Status: DC
  Administered 2012-11-18 – 2012-11-28 (×8): 10 mg via RECTAL
  Filled 2012-11-18 (×11): qty 1

## 2012-11-18 NOTE — Progress Notes (Addendum)
11 Days Post-Op  Subjective: No real change, nothing to speak of recorded per NG, but the cannister is full, she was only walked once yesterday, so mobilization is not really occuring.  She wants 3 people  to go with her.  I ask nurse to walk with 2 people today.  PT will be here today. For at least one walk.  Objective: Vital signs in last 24 hours: Temp:  [97.9 F (36.6 C)-98.4 F (36.9 C)] 97.9 F (36.6 C) (01/13 0513) Pulse Rate:  [86-100] 86  (01/13 0513) Resp:  [16-20] 16  (01/13 0513) BP: (116-152)/(59-66) 123/59 mmHg (01/13 0513) SpO2:  [97 %-100 %] 99 % (01/13 0854) Last BM Date: 11/15/12 Nothing recorded from her NG, NPO/TNA, afebrile, VSS, labs show chemistries OK, WBC up some, anemia is the same.  Intake/Output from previous day: 01/12 0701 - 01/13 0700 In: 1085 [I.V.:485; TPN:600] Out: 2050 [Urine:2050] Intake/Output this shift:    General appearance: alert, cooperative and no distress Resp: she moves very little air sitting in Bed. GI: still distended, no flatus + BM with suppository, not really all that tender.  Lab Results:   [facility] 11/18/12 0510 11/16/12 0510  WBC 12.1* 10.1  HGB 8.2* 8.2*  HCT 26.3* 25.8*  PLT 440* 394    BMET  Basename 11/18/12 0510 11/16/12 0510  NA 135 135  K 4.3 3.9  CL 93* 94*  CO2 39* 40*  GLUCOSE 132* 173*  BUN 16 15  CREATININE 0.42* 0.44*  CALCIUM 9.2 9.0   PT/INR No results found for this basename: LABPROT:2,INR:2 in the last 72 hours   Lab 11/18/12 0510 11/14/12 0350 11/12/12 0650  AST 36 15 12  ALT 30 19 19   ALKPHOS 110 71 52  BILITOT 0.4 0.3 0.3  PROT 4.9* 5.0* 4.5*  ALBUMIN 1.9* 2.0* 1.7*     Lipase  No results found for this basename: lipase     Studies/Results: No results found.  Medications:    . albuterol  2.5 mg Nebulization Q6H  . antiseptic oral rinse  15 mL Mouth Rinse QID  . bisacodyl  10 mg Rectal Once  . budesonide  0.25 mg Nebulization QID  . chlorhexidine  15 mL Mouth Rinse BID    . heparin  5,000 Units Subcutaneous Q8H  . insulin aspart  0-9 Units Subcutaneous Q6H  . insulin glargine  10 Units Subcutaneous Daily  . lip balm  1 application Topical BID  . metoCLOPramide (REGLAN) injection  5 mg Intravenous Q6H  . pantoprazole (PROTONIX) IV  40 mg Intravenous Daily  . sodium chloride  10-40 mL Intracatheter Q12H  . 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  Hypertension  Hyperlipidemia Hx of ovarian ca with abdominal hysterectomy  Post op anemia  PCM prealbumin is 6.9, on TNA  Plan:  Mobilize and wait for ileus to improve.    LOS: 20 days    Pape Parson 11/18/2012

## 2012-11-18 NOTE — Progress Notes (Signed)
NGT I&O not recorded ?BM with supp Check films Try enema if not better Need to mobilize her more

## 2012-11-18 NOTE — Progress Notes (Signed)
PARENTERAL NUTRITION CONSULT NOTE - Follow-up  Pharmacy Consult for TNA Indication: SBO  Allergies  Allergen Reactions  . Oxycodone-Acetaminophen     REACTION: headaches,nausea   Patient Measurements: Height: 5\' 5"  (165.1 cm) Weight: 114 lb 6.7 oz (51.9 kg) IBW/kg (Calculated) : 57  Usual Weight: 43 kg  Vital Signs: Temp: 97.9 F (36.6 C) (01/13 0513) Temp src: Oral (01/13 0513) BP: 123/59 mmHg (01/13 0513) Pulse Rate: 86  (01/13 0513) Intake/Output from previous day: 01/12 0701 - 01/13 0700 In: 1085 [I.V.:485; TPN:600] Out: 2050 [Urine:2050] Intake/Output from this shift:    Labs:  [facility] 11/18/12 0510 11/16/12 0510  WBC 12.1* 10.1  HGB 8.2* 8.2*  HCT 26.3* 25.8*  PLT 440* 394  APTT -- --  INR -- --    Basename 11/18/12 0510 11/16/12 0510  NA 135 135  K 4.3 3.9  CL 93* 94*  CO2 39* 40*  GLUCOSE 132* 173*  BUN 16 15  CREATININE 0.42* 0.44*  LABCREA -- --  CREAT24HRUR -- --  CALCIUM 9.2 9.0  MG 2.0 --  PHOS 3.3 --  PROT 4.9* --  ALBUMIN 1.9* --  AST 36 --  ALT 30 --  ALKPHOS 110 --  BILITOT 0.4 --  BILIDIR -- --  IBILI -- --  PREALBUMIN -- --  TRIG -- --  CHOLHDL -- --  CHOL -- --   Estimated Creatinine Clearance: 52.8 ml/min (by C-G formula based on Cr of 0.42).   Basename 11/18/12 0643 11/17/12 2351 11/17/12 1735  GLUCAP 163* 125* 117*   Medications:  Scheduled:     . albuterol  2.5 mg Nebulization Q6H  . antiseptic oral rinse  15 mL Mouth Rinse QID  . bisacodyl  10 mg Rectal Once  . budesonide  0.25 mg Nebulization QID  . chlorhexidine  15 mL Mouth Rinse BID  . heparin  5,000 Units Subcutaneous Q8H  . insulin aspart  0-9 Units Subcutaneous Q6H  . insulin glargine  10 Units Subcutaneous Daily  . lip balm  1 application Topical BID  . metoCLOPramide (REGLAN) injection  5 mg Intravenous Q6H  . pantoprazole (PROTONIX) IV  40 mg Intravenous Daily  . sodium chloride  10-40 mL Intracatheter Q12H  . tiotropium  18 mcg Inhalation Daily    Infusions:     . dextrose 5 % and 0.45 % NaCl with KCl 20 mEq/L 45 mL/hr at 11/17/12 1957  . [EXPIRED] TPN (CLINIMIX) +/- additives 55 mL/hr at 11/16/12 1728  . TPN (CLINIMIX) +/- additives 55 mL/hr at 11/17/12 1739   Insulin Requirements in the past 24 hours:  CBG 117-163 Sensitive SSI - 3 units in past 24 hours Lantus 10 units at bedtime  Current Nutrition:  NPO  TNA @ 55 ml/hr Lipids @ 10 ml/hr MWF ONLY Relgan 5 mg IV Q6h   Fluids: MIVF: D5 1/2 NS with 20 mEQ K+ @ 45 ml/hr  Assessment 71 yoF admit 12/24 with COPD exacerbation. She developed progressive abdominal distention with KUB and CT abd c/w SBO. Underwent exploratory laparotomy, lysis of adhesions 1/02.  TNA started 11/11/12.  She has had small BM's but abd remains distended, nausea with NG tube clamped or unclamped.    Electrolytes:  WNL 1/13, potassium trending up, will monitor/trepeat BMET; Mg/Phos WNL  CBG's: Improved, all but 1 reading within goal < 150 in past 24 hours.   Liver: LFT's remain WNL (1/13)  Renal: Scr remains wnl; CrCl 52 ml/min   Prealbumin: 6.9 (1/7), pending for 1/13  TGs: 129 (1/7), Pending for 1/13  Nutritional Goals: per RD Kcal: 1500-1750 Kcal/day  Protein: 70-90g/day  Fluid: 1.5-1.7L   Clinimix E 5/20 at goal rate of 70 ml/hr would deliver 84 gm protein, avg 1680 kcal/day.  Plan:   Increase Clinimix E5/20 to goal rate 70 ml/hr  Lipids, MVI and trace elements MWF only due to national back order.  Continue CBG q6h, sensitive scale.  Decrease MIVF to 30 ml/mhr  TNA lab panel Mon/Thurs  Follow-up TG/Cholesterol and Per-albumin  Mykeal Carrick, Loma Messing PharmD Pager #: [phone] 8:28 AM 11/18/2012

## 2012-11-18 NOTE — Progress Notes (Signed)
Removed staples from pt incision and replaced with steristrips. Pt tolerated well.

## 2012-11-18 NOTE — Progress Notes (Signed)
NUTRITION FOLLOW UP  Intervention:   - Continue TPN per pharmacy - Diet advancement per MD - RD to continue to monitor   Nutrition Dx:   Inadequate oral intake r/t shortness of breath as evidenced by pt consuming 2 small meals/day - now changed to r/t inability to eat as evidenced by pt NPO.   Goal:   1. Intake >50% of meals - not met, pt NPO 2. Nutrition support, initiation if appropriate - met, pt continues TPN  New goal: TPN to meet >90% of estimated nutritional needs. Met  Monitor:   Weights, labs, diet advancement, BM, TPN  Assessment:   Pt admitted (12/24) for shortness of breath who developed progressive abdominal pain and distention consistent with SBO.  Underwent exploratory laparotomy with lysis of adhesions (1/2). Now with post-op ileus, on TPN since (1/6). Pt with NGT in place and remains NPO. Pt's PO intake has been largely poor since admission, and now has been NPO for several days r/t to SBO and ileus.   Patient is receiving TPN with Clinimix E 5/20 @ 55 ml/hr.  Lipids (20% IVFE @ 10 ml/hr), multivitamins, and trace elements are provided 3 times weekly (MWF) due to national backorder.  Provides 1383 kcal and 66 grams protein daily (based on weekly average).  Meets 92% minimum estimated kcal and 94% minimum estimated protein needs.  Additional IVF with D5 1/2 NS @ 30 ml/hr.  Note plan is to increased Clinimix 5/20 to 70 mL/hr this evening to better meet estimated needs due to ongoing TPN need.    Lipid Panel     Component Value Date/Time   CHOL 108 11/18/2012 0510   TRIG 105 11/18/2012 0510       Height: Ht Readings from Last 1 Encounters:  11/07/12 5\' 5"  (1.651 m)    Weight Status:   Wt Readings from Last 1 Encounters:  11/10/12 114 lb 6.7 oz (51.9 kg)  *No new wt  Re-estimated needs:  Kcal: 1500-1750 Protein: 70-90g Fluid: 1.5-1.7L  Skin: Mild pitting RUE, LUE, RLE, and LLE edema  Diet Order: NPO   Intake/Output Summary (Last 24 hours) at  11/18/12 1532 Last data filed at 11/18/12 1349  Gross per 24 hour  Intake   1085 ml  Output   1700 ml  Net   -615 ml    Last BM: 1/12, abdomen remains distended, +flatus   Labs:   Lab 11/18/12 0510 11/16/12 0510 11/14/12 0350 11/12/12 0650  NA 135 135 136 --  K 4.3 3.9 4.1 --  CL 93* 94* 96 --  CO2 39* 40* 37* --  BUN 16 15 14  --  CREATININE 0.42* 0.44* 0.50 --  CALCIUM 9.2 9.0 9.2 --  MG 2.0 -- 1.7 1.6  PHOS 3.3 -- 2.9 3.0  GLUCOSE 132* 173* 194* --    CBG (last 3)   Basename 11/18/12 1200 11/18/12 0643 11/17/12 2351  GLUCAP 141* 163* 125*    Scheduled Meds:    . albuterol  2.5 mg Nebulization Q6H  . antiseptic oral rinse  15 mL Mouth Rinse QID  . bisacodyl  10 mg Rectal Daily  . budesonide  0.25 mg Nebulization QID  . chlorhexidine  15 mL Mouth Rinse BID  . heparin  5,000 Units Subcutaneous Q8H  . insulin aspart  0-9 Units Subcutaneous Q6H  . insulin glargine  10 Units Subcutaneous Daily  . lip balm  1 application Topical BID  . pantoprazole (PROTONIX) IV  40 mg Intravenous Daily  . sodium  chloride  10-40 mL Intracatheter Q12H  . tiotropium  18 mcg Inhalation Daily    Continuous Infusions:    . dextrose 5 % and 0.45 % NaCl with KCl 20 mEq/L    . fat emulsion    . TPN (CLINIMIX) +/- additives 55 mL/hr at 11/17/12 1739  . TPN [facility]) +/- additives     Loyce Dys, MS RD LDN Clinical Inpatient Dietitian Pager: [phone] Weekend/After hours pager: [phone]

## 2012-11-18 NOTE — Progress Notes (Signed)
NG tube is pulling up bright red fluid. PA was called and orders received. Will continue to monitor.

## 2012-11-18 NOTE — Progress Notes (Signed)
Occupational Therapy Treatment Patient Details Name: [name] MRN: [id number] DOB: [birth year] Today's Date: 11/18/2012 Time: 7829-5621 OT Time Calculation (min): 33 min  OT Assessment / Plan / Recommendation Comments on Treatment Session O2 sats dropped to mid 80s after short walk and light ADL activity on 3L of O2 and frequent RBs. Con't to strongly recommend snf as pt lives alone.    Follow Up Recommendations  SNF;Supervision/Assistance - 24 hour (If pt continues to decline then [facility].)    Barriers to Discharge       Equipment Recommendations  3 in 1 bedside comode    Recommendations for Other Services    Frequency Min 2X/week   Plan Discharge plan remains appropriate    Precautions / Restrictions Precautions Precautions: Fall Precaution Comments: NG tube and O2 Restrictions Weight Bearing Restrictions: No   Pertinent Vitals/Pain Pt denied pain    ADL  Grooming: Wash/dry face;Set up;Minimal assistance Where Assessed - Grooming: Unsupported sitting (A needed to brush the back of hair.) Upper Body Bathing: Set up Where Assessed - Upper Body Bathing: Unsupported sitting Lower Body Bathing: Moderate assistance Where Assessed - Lower Body Bathing: Supported sit to stand (Pt bathe back peri area.) Toilet Transfer: Minimal assistance Toilet Transfer Method: Stand pivot Toilet Transfer Equipment: Bedside commode Toileting - Clothing Manipulation and Hygiene: Minimal assistance Where Assessed - Engineer, mining and Hygiene: Sit to stand from 3-in-1 or toilet ADL Comments: Pt needed more assist this am with spongebath. Required several extended RBs. States that selfcare activities "wear her out."    OT Diagnosis:    OT Problem List:   OT Treatment Interventions:     OT Goals ADL Goals ADL Goal: Grooming - Progress: Not progressing ADL Goal: Toilet Transfer - Progress: Progressing toward goals ADL Goal: Toileting - Clothing Manipulation - Progress:  Progressing toward goals ADL Goal: Toileting - Hygiene - Progress: Progressing toward goals ADL Goal: Additional Goal #1 - Progress: Progressing toward goals  Visit Information  Last OT Received On: 11/18/12 Assistance Needed: +2 (+2 for equipment(IV/O2) and chair)    Subjective Data  Subjective: I'm going to hire someone to help me at home.   Prior Functioning       Cognition  Overall Cognitive Status: Appears within functional limits for tasks assessed/performed Arousal/Alertness: Awake/alert Orientation Level: Appears intact for tasks assessed Behavior During Session: [facility] for tasks performed Cognition - Other Comments: pt anxious and particular about the set up of her bed, pillows, etc. Pt does not want the door or curtain closed.Marland KitchenMarland KitchenPt with claustrophobia    Mobility  Shoulder Instructions Bed Mobility Bed Mobility: Not assessed Sit to Supine: 4: Min assist;With rail;HOB flat Details for Bed Mobility Assistance: Min cues for safety. Transfers Sit to Stand: 4: Min guard;From toilet;From chair/3-in-1;From bed Stand to Sit: 4: Min guard;To toilet;To chair/3-in-1;To bed Details for Transfer Assistance: 50% VC's to push up from behind vs pull up from RW.       Exercises      Balance Dynamic Standing Balance Dynamic Standing - Balance Support: Left upper extremity supported;During functional activity Dynamic Standing - Level of Assistance: 4: Min assist Dynamic Standing - Comments: To spongebathe LB in standing.    End of Session OT - End of Session Equipment Utilized During Treatment: Gait belt Activity Tolerance: Patient limited by fatigue Patient left: in bed;with call bell/phone within reach  GO     [name] A OTR/L [phone] 11/18/2012, 1:38 PM

## 2012-11-18 NOTE — Progress Notes (Signed)
Pt profile: 72 y/o wf admitted by [facility] 12/24 with dx of AECOP and treated accordingly with improvement in respiratory symptoms. She developed progressive abdominal distention with KUB and CT abd c/w SBO. Underwent exploratory laparotomy, lysis of adhesions 1/02. Returned to ICU on vent. PCCM asked to assist with vent/ICU mgmt   Lines, Tubes, etc: ETT 1/02 >> 1/03 R IJ CVL 1/02 >>   Microbiology: MRSA PCR 1/02 >> NEG 1/3 BC x 2 >>not collected 1/3 UC >> NEG  Antibiotics:  vanco (empiric, fever) 1/3 >> 1/04 Zosyn (empiric, fever) 1/3 >> 1/10    Studies/Events: 12/31 CT abd: Small bowel obstruction with dilated small bowel to the level of the distal ileum in the region of the pelvis where there is abrupt change of caliber which may be related to adhesions 1/2 Hypotension post op >pressor support  1/3 low grade fever/elevated WBC >pan cx  1/9 - ileus continues, n/v with NGT clamped, stable but tenuous respiratory status   Consults:  CCS 12/30  Best Practice: DVT: SQ hep SUP: PPI Nutrition: none Glycemic control: SSI Sedation/analgesia: PRN fentanyl   [name]: no change over weekend.  Still requiring NG.  No increased wob.  Now off abx.  Objective:  Filed Vitals:   11/17/12 2109 11/17/12 2112 11/18/12 0513 11/18/12 0854  BP:  116/59 123/59   Pulse:  94 86   Temp:  98.4 F (36.9 C) 97.9 F (36.6 C)   TempSrc:  Oral Oral   Resp:  18 16   Height:      Weight:      SpO2: 97% 100% 100% 99%   Gen: wd female, nad.   HEENT: NGT in place, op clear Neck: no JVD or LN Chest: decreased bs, no wheezing, a few basilar crackles. Cardiac: RRR, no MRG Abd: hypoactive, surgical incision midline c/d/i, staples intact Ext:no edema or cyanosis Neuro: a/o x 3 , moves all 4.   BMET  Lab 11/18/12 0510 11/16/12 0510 11/14/12 0350 11/13/12 0600 11/12/12 0650 11/11/12 1300  NA 135 135 136 137 137 --  K 4.3 3.9 -- -- -- --  CL 93* 94* 96 98 99 --  CO2 39* 40* 37* 36* 34* --  GLUCOSE  132* 173* 194* 146* 226* --  BUN 16 15 14 10  5* --  CREATININE 0.42* 0.44* 0.50 0.57 0.50 --  CALCIUM 9.2 9.0 9.2 9.1 8.5 --  MG 2.0 -- 1.7 -- 1.6 1.6  PHOS 3.3 -- 2.9 -- 3.0 2.1*   CBC  Lab 11/18/12 0510 11/16/12 0510 11/12/12 0650  HGB 8.2* 8.2* 8.2*  HCT 26.3* 25.8* 24.1*  WBC 12.1* 10.1 9.3  PLT 440* 394 284   CXR:  1/6> IMPRESSION: Continued improvement in right basilar airspace disease. No new abnormality.     IMPRESSION:  SBO (small bowel obstruction) - Status post laparotomy per [facility]CS.  NGT remains to suction.  Nausea 1/9 with clamping.  Plan: -rec's per Surgery- she is up walking in hall today -TPN per pharmacy -trend BMP prn   Post-operative respiratory failure - Resolved.  Emphysema, severe - No acute bronchospasm or increased wob at rest.   Plan: -aggressive pulmonary hygiene IS/Flutter -pulmicort, BD's    Anemia - in setting of chronic disease / acute critical illness.  No indication for transfusion.  Plan: -trend CBC

## 2012-11-18 NOTE — Progress Notes (Signed)
Physical Therapy Treatment Patient Details Name: [name] MRN: [id number] DOB: 1941/05/03 Today's Date: 11/18/2012 Time: 4098-1191 PT Time Calculation (min): 23 min  PT Assessment / Plan / Recommendation Comments on Treatment Session  Amb pt in hallway twice with RW at min assist for safety with 2nd assist following with recliner as pt demon limited activity tolerance and requires sitting rest break. Pt declines SNF and plans to D/C to home.    Follow Up Recommendations  SNF (Pt declines and wishes to go home))     Does the patient have the potential to tolerate intense rehabilitation     Barriers to Discharge        Equipment Recommendations       Recommendations for Other Services    Frequency Min 3X/week   Plan Discharge plan remains appropriate    Precautions / Restrictions Precautions Precautions: Fall Precaution Comments: NG tube and O2 Restrictions Weight Bearing Restrictions: No   Pertinent Vitals/Pain No c/o pain C/o fatigue    Mobility  Bed Mobility Bed Mobility: Not assessed Details for Bed Mobility Assistance: Pt on BSC on arrival Transfers Transfers: Sit to Stand;Stand to Sit Sit to Stand: 4: Min guard;From toilet;From chair/3-in-1;From bed Stand to Sit: 4: Min guard;To toilet;To chair/3-in-1;To bed Details for Transfer Assistance: 50% VC's to push up from behind vs pull up from RW. Ambulation/Gait Ambulation/Gait Assistance: 1: +2 Total assist Ambulation/Gait: Patient Percentage: 80% Ambulation Distance (Feet): 115 Feet (75', 50' sitting rest break) Assistive device: Rolling Zufall Ambulation/Gait Assistance Details: + 2 for equipment and chair as pt demon 3/4 DOE and requires sitting rest break.  Amb on 3 lts O2 sats avg 85 - 92%. Pt aware when she needs to stop and rest. Gait Pattern: Step-through pattern;Decreased stride length Gait velocity: decreased     PT Goals                                       progressing    Visit Information  Last PT Received On: 11/18/12 Assistance Needed: +2 (+2 for equipment(IV/O2) and chair)    Subjective Data  Subjective: I want to go back to bed Patient Stated Goal: n/a   Cognition       Balance     End of Session PT - End of Session Equipment Utilized During Treatment: Gait belt Activity Tolerance: Patient limited by fatigue Patient left: in bed;with call bell/phone within reach   Felecia Shelling  PTA [facility]  Acute  Rehab Pager     [phone]

## 2012-11-19 ENCOUNTER — Ambulatory Visit (HOSPITAL_COMMUNITY): Payer: Medicare Other

## 2012-11-19 ENCOUNTER — Encounter (HOSPITAL_COMMUNITY): Payer: Medicare Other

## 2012-11-19 DIAGNOSIS — E43 Unspecified severe protein-calorie malnutrition: Secondary | ICD-10-CM | POA: Diagnosis present

## 2012-11-19 LAB — BASIC METABOLIC PANEL
BUN: 20 mg/dL (ref 6–23)
CO2: 39 mEq/L — ABNORMAL HIGH (ref 19–32)
Chloride: 93 mEq/L — ABNORMAL LOW (ref 96–112)
GFR calc non Af Amer: >90 mL/min (ref >90)
Glucose, Bld: 170 mg/dL — ABNORMAL HIGH (ref 70–99)
Potassium: 4.6 mEq/L (ref 3.5–5.1)
Sodium: 135 mEq/L (ref 135–145)

## 2012-11-19 LAB — GLUCOSE, CAPILLARY: Glucose-Capillary: 132 mg/dL — ABNORMAL HIGH (ref 70–99)

## 2012-11-19 MED ORDER — IOHEXOL 300 MG/ML  SOLN
100.0000 mL | Freq: Once | INTRAMUSCULAR | Status: AC | PRN
  Administered 2012-11-19: 100 mL via INTRAVENOUS

## 2012-11-19 MED ORDER — CLINIMIX E/DEXTROSE (5/20) 5 % IV SOLN
INTRAVENOUS | Status: AC
  Administered 2012-11-19: 18:00:00 via INTRAVENOUS
  Filled 2012-11-19: qty 2000

## 2012-11-19 NOTE — Progress Notes (Signed)
Check films Mobilize more  Ardeth Sportsman, M.D., F.A.C.S. Gastrointestinal and Minimally Invasive Surgery [facility] [address], [address] [place], [place] [address] [phone] Main / Paging [phone] Voice Mail

## 2012-11-19 NOTE — Progress Notes (Signed)
Pt profile: 72 y/o wf admitted by [facility] 12/24 with dx of AECOP and treated accordingly with improvement in respiratory symptoms. She developed progressive abdominal distention with KUB and CT abd c/w SBO. Underwent exploratory laparotomy, lysis of adhesions 1/02. Returned to ICU on vent. PCCM asked to assist with vent/ICU mgmt   Lines, Tubes, etc: ETT 1/02 >> 1/03 R IJ CVL 1/02 >> out  Microbiology: MRSA PCR 1/02 >> NEG 1/3 BC x 2 >>not collected 1/3 UC >> NEG  Antibiotics:  vanco (empiric, fever) 1/3 >> 1/04 Zosyn (empiric, fever) 1/3 >> 1/10    Studies/Events: 12/31 CT abd: Small bowel obstruction with dilated small bowel to the level of the distal ileum in the region of the pelvis where there is abrupt change of caliber which may be related to adhesions 1/2 Hypotension post op >pressor support  1/3 low grade fever/elevated WBC >pan cx  1/9 - ileus continues, n/v with NGT clamped, stable but tenuous respiratory status   Consults:  CCS 12/30  Best Practice: DVT: SQ hep SUP: PPI Nutrition: none Glycemic control: SSI Sedation/analgesia: PRN fentanyl   [name]: no change overnigh.  Still requiring NG.  No increased wob.  Now off abx.  Objective:  Filed Vitals:   11/18/12 2135 11/19/12 0223 11/19/12 0625 11/19/12 0751  BP:   143/70   Pulse:   114   Temp:   98.1 F (36.7 C)   TempSrc:   Oral   Resp:   18   Height:      Weight:      SpO2: 98% 91% 97% 96%   Gen: wd female, nad.   HEENT: NGT in place, op clear Neck: no JVD or LN Chest: decreased bs, no wheezing, a few basilar crackles. Cardiac: RRR, no MRG Abd: hypoactive, surgical incision midline c/d/i, staples intact Ext:no edema or cyanosis Neuro: a/o x 3 , moves all 4.   BMET  Lab 11/19/12 0610 11/18/12 0510 11/16/12 0510 11/14/12 0350 11/13/12 0600  NA 135 135 135 136 137  K 4.6 4.3 -- -- --  CL 93* 93* 94* 96 98  CO2 39* 39* 40* 37* 36*  GLUCOSE 170* 132* 173* 194* 146*  BUN 20 16 15 14 10   CREATININE  0.38* 0.42* 0.44* 0.50 0.57  CALCIUM 9.6 9.2 9.0 9.2 9.1  MG -- 2.0 -- 1.7 --  PHOS -- 3.3 -- 2.9 --   CBC  Lab 11/18/12 0510 11/16/12 0510  HGB 8.2* 8.2*  HCT 26.3* 25.8*  WBC 12.1* 10.1  PLT 440* 394      IMPRESSION:  SBO (small bowel obstruction) - Status post laparotomy per CCS.  NGT remains to suction.  Nausea 1/9 with clamping.  Plan: -rec's per Surgery- she is up walking in hall today -TPN per pharmacy -trend BMP prn   Post-operative respiratory failure - Resolved.  Emphysema, severe - No acute bronchospasm or increased wob at rest.   Plan: -aggressive pulmonary hygiene IS/Flutter -pulmicort, BD's    Anemia - in setting of chronic disease / acute critical illness.  No indication for transfusion.  Plan: -trend CBC

## 2012-11-19 NOTE — Progress Notes (Signed)
Clinical Social Worker continuing to follow. Per [facility], [facility] stated that pt insurance changed at the beginning of the new year and pt does not have LTACH benefits. Clinical Social Worker continuing for potential SNF placement. Clinical Social Worker awaiting surgical consult in order determine plan of care before initiating SNF search. Clinical Social Worker to continue to follow.  Jacklynn Lewis, MSW, LCSWA  Clinical Social Work [phone]

## 2012-11-19 NOTE — Progress Notes (Signed)
Occupational Therapy Treatment and Goal Update Patient Details Name: [name] MRN: [id number] DOB: Mar 14, 1941 Today's Date: 11/19/2012 Time: 4098-1191 OT Time Calculation (min): 41 min  OT Assessment / Plan / Recommendation Comments on Treatment Session      Follow Up Recommendations  SNF (vs 24/7 if pt refuses)    Barriers to Discharge       Equipment Recommendations  3 in 1 bedside comode    Recommendations for Other Services    Frequency Min 2X/week   Plan Discharge plan remains appropriate    Precautions / Restrictions Precautions Precautions: Fall Precaution Comments: NG tube and O2 Restrictions Weight Bearing Restrictions: No   Pertinent Vitals/Pain No c/o pain.  Pt fatiques quickly--used 02 during tx    ADL  Grooming: Performed;Wash/dry hands;Set up Where Assessed - Grooming: Unsupported sitting Lower Body Bathing: Performed;Maximal assistance Where Assessed - Lower Body Bathing: Supine, head of bed up Upper Body Dressing: Performed;Maximal assistance (due to fatique) Where Assessed - Upper Body Dressing: Supine, head of bed up Lower Body Dressing: Performed;+1 Total assistance Where Assessed - Lower Body Dressing: Supine, head of bed up Toilet Transfer: Performed;Minimal assistance Toilet Transfer Method: Sit to stand Toilet Transfer Equipment: Raised toilet seat with arms (or 3-in-1 over toilet) Toileting - Clothing Manipulation and Hygiene: Performed;Moderate assistance Where Assessed - Toileting Clothing Manipulation and Hygiene: Sit to stand from 3-in-1 or toilet Transfers/Ambulation Related to ADLs: pt wanted to walk to bathroom:  RN disconnected NG tube and 02 secured to IV pole.  Pt got fatiqued and rest breaks needed.  Pt needed assistance with hygiene because of fatique. ADL Comments: Pt had loose stool after using bathroom:  changed sheets and assisted with LB bathing.  Also had leak from NG tube and changed gown twice.  Pt very fatiqued and  needed help with these activities.  She was able to roll in bed using bed rail with min to mod A:  she needed more assistance as session progressed.    OT Diagnosis:    OT Problem List:   OT Treatment Interventions:     OT Goals Acute Rehab OT Goals Time For Goal Achievement: 12/03/12 Potential to Achieve Goals: Good ADL Goals Pt Will Transfer to Toilet: with supervision;Stand pivot transfer;3-in-1 ADL Goal: Toilet Transfer - Progress: Other (comment) (revised due to decreased activity tolerance) Pt Will Perform Toileting - Clothing Manipulation: with min assist (min guard) ADL Goal: Toileting - Clothing Manipulation - Progress: Other (comment) (revised due to decreased activity tolerance) Pt Will Perform Toileting - Hygiene: with min assist;Sit to stand from 3-in-1/toilet (min guard) ADL Goal: Toileting - Hygiene - Progress: Revised due to lack of progress Additional ADL Goal #1: Pt will complete LB adls with min A using AE as needed and initiate at least two rest breaks per session as needed without cues ADL Goal: Additional Goal #1 - Progress: Revised due to lack of progress Miscellaneous OT Goals Miscellaneous OT Goal #2: pt will verbalize 3 energy conservation techniques OT Goal: Miscellaneous Goal #2 - Progress: Other (comment) (continue goal)  Visit Information  Last OT Received On: 11/19/12 Assistance Needed: +1 (with 02 tank secured to iv pole)    Subjective Data      Prior Functioning       Cognition  Overall Cognitive Status: Appears within functional limits for tasks assessed/performed Arousal/Alertness: Awake/alert Orientation Level: Appears intact for tasks assessed    Mobility  Shoulder Instructions Bed Mobility Rolling Right: 4: Min assist;3: Mod assist;With rail (variable)  Rolling Left: 4: Min assist;3: Mod assist (variable) Supine to Sit: 3: Mod assist;HOB elevated Transfers Sit to Stand: From bed;From toilet;4: Min guard Details for Transfer  Assistance: min cues for safety       Exercises      Balance     End of Session OT - End of Session Activity Tolerance: Patient limited by fatigue Patient left: in bed;with call bell/phone within reach;with family/visitor present  GO     [name] 11/19/2012, 4:24 PM Marica Otter, OTR/L [phone] 11/19/2012

## 2012-11-19 NOTE — Progress Notes (Signed)
PARENTERAL NUTRITION CONSULT NOTE - Follow-up  Pharmacy Consult for TNA Indication: SBO  Allergies  Allergen Reactions  . Oxycodone-Acetaminophen     REACTION: headaches,nausea   Patient Measurements: Height: 5\' 5"  (165.1 cm) Weight: 114 lb 6.7 oz (51.9 kg) IBW/kg (Calculated) : 57  Usual Weight: 43 kg  Vital Signs: Temp: 98.1 F (36.7 C) (01/14 0625) Temp src: Oral (01/14 0625) BP: 143/70 mmHg (01/14 0625) Pulse Rate: 114  (01/14 0625) Intake/Output from previous day: 01/13 0701 - 01/14 0700 In: 1543 [I.V.:353; TPN:1190] Out: 1950 [Urine:1800; Emesis/NG output:150] Intake/Output from this shift:    Labs:  [facility] 11/18/12 0510  WBC 12.1*  HGB 8.2*  HCT 26.3*  PLT 440*  APTT --  INR --    [facility] 11/19/12 0610 11/18/12 0510  NA 135 135  K 4.6 4.3  CL 93* 93*  CO2 39* 39*  GLUCOSE 170* 132*  BUN 20 16  CREATININE 0.38* 0.42*  LABCREA -- --  CREAT24HRUR -- --  CALCIUM 9.6 9.2  MG -- 2.0  PHOS -- 3.3  PROT -- 4.9*  ALBUMIN -- 1.9*  AST -- 36  ALT -- 30  ALKPHOS -- 110  BILITOT -- 0.4  BILIDIR -- --  IBILI -- --  PREALBUMIN -- 10.4*  TRIG -- 105  CHOLHDL -- --  CHOL -- 108   Estimated Creatinine Clearance: 52.8 ml/min (by C-G formula based on Cr of 0.38).   Basename 11/19/12 0628 11/18/12 2332 11/18/12 1811  GLUCAP 171* 168* 124*   Medications:  Scheduled:     . albuterol  2.5 mg Nebulization Q6H  . antiseptic oral rinse  15 mL Mouth Rinse QID  . bisacodyl  10 mg Rectal Daily  . budesonide  0.25 mg Nebulization QID  . chlorhexidine  15 mL Mouth Rinse BID  . heparin  5,000 Units Subcutaneous Q8H  . insulin aspart  0-9 Units Subcutaneous Q6H  . insulin glargine  10 Units Subcutaneous Daily  . lip balm  1 application Topical BID  . pantoprazole (PROTONIX) IV  40 mg Intravenous Daily  . sodium chloride  10-40 mL Intracatheter Q12H  . tiotropium  18 mcg Inhalation Daily  . [DISCONTINUED] bisacodyl  10 mg Rectal Once  . [DISCONTINUED]  metoCLOPramide (REGLAN) injection  5 mg Intravenous Q6H   Infusions:     . dextrose 5 % and 0.45 % NaCl with KCl 20 mEq/L 30 mL/hr at 11/18/12 1814  . fat emulsion 250 mL (11/18/12 1720)  . [EXPIRED] TPN (CLINIMIX) +/- additives 55 mL/hr at 11/17/12 1739  . TPN (CLINIMIX) +/- additives 70 mL/hr at 11/18/12 1719  . [DISCONTINUED] dextrose 5 % and 0.45 % NaCl with KCl 20 mEq/L 45 mL/hr at 11/17/12 1957   Insulin Requirements in the past 24 hours:  CBG 124-171 Sensitive SSI - 6 units in past 24 hours Lantus 10 units at bedtime  Current Nutrition:  NPO  TNA @ 70 ml/hr Lipids @ 10 ml/hr MWF ONLY Relgan 5 mg IV Q6h   Fluids: MIVF: D5 1/2 NS with 20 mEQ K+ @ 30 ml/hr  Assessment 71 yoF admit 12/24 with COPD exacerbation. She developed progressive abdominal distention with KUB and CT abd c/w SBO. Underwent exploratory laparotomy, lysis of adhesions 1/02.  TNA started 11/11/12.  She has had small BM's but abd remains distended, nausea with NG tube clamped or unclamped.    Electrolytes:  Stable except corrected Ca now 11.3 - continue to monitor  CBG's: elevated some likely due to  increased rate of TNA - continue current SSI and Lantus for now and monitor for next 24hrs for potential changes tomorrow  Liver: LFT's remain WNL (1/13)  Renal: Scr remains wnl; CrCl 52 ml/min   Prealbumin: 6.9 (1/7), 10.4 (1/13)  TGs: 129 (1/7), 105 (1/13)  Nutritional Goals: per RD Kcal: 1500-1750 Kcal/day  Protein: 70-90g/day  Fluid: 1.5-1.7L  Clinimix E 5/20 at goal rate of 70 ml/hr would deliver 84 gm protein, avg 1680 kcal/day.  Plan:   Continue Clinimix E5/20 at goal rate 70 ml/hr  Lipids, MVI and trace elements MWF only due to national back order.  Continue CBG q6h, sensitive scale.  TNA lab panel Mon/Thurs   Hessie Knows, PharmD, BCPS Pager [phone] 11/19/2012 8:40 AM

## 2012-11-19 NOTE — Progress Notes (Signed)
[name] [id number] 07-30-1941   Subjective:  Pt notes small BM with suppository No flatus Denies much pain  Objective:  Vital signs:  Filed Vitals:   11/18/12 2100 11/18/12 2135 11/19/12 0223 11/19/12 0625  BP: 111/61   143/70  Pulse: 106   114  Temp: 98.6 F (37 C)   98.1 F (36.7 C)  TempSrc: Oral   Oral  Resp: 16   18  Height:      Weight:      SpO2: 100% 98% 91% 97%    Last BM Date: 11/17/12  Intake/Output   Yesterday:  01/13 0701 - 01/14 0700 In: 1543 [I.V.:353; TPN:1190] Out: 1950 [Urine:1800; Emesis/NG output:150] This shift:     Bowel function:  Flatus: n  BM: small  Physical Exam:  General: Pt awake/alert/oriented x4 in no acute distress Eyes: PERRL, normal EOM.  Sclera clear.  No icterus Neuro: CN II-XII intact w/o focal sensory/motor deficits. Lymph: No head/neck/groin lymphadenopathy Psych:  No delerium/psychosis/paranoia HENT: Normocephalic, Mucus membranes moist.  No thrush Neck: Supple, No tracheal deviation Chest: Fair air movement - stable.  Prefers dry oxygen over humidified.  No chest wall pain w good excursion CV:  Pulses intact.  Regular rhythm MS: Normal AROM mjr joints.  No obvious deformity Abdomen: Soft.  Moderately distended & firm.  Nontender.  Incision closed.  No incarcerated hernias. Ext:  SCDs BLE.  No mjr edema.  No cyanosis Skin: No petechiae / purpurae   Assessment   Patient Active Problem List  Diagnosis  . Anxiety  . Anemia  . SBO (small bowel obstruction)  . Emphysema, severe  . Respiratory failure, post-operative  . Status post laparotomy  . History of ovarian cancer, s/p remote resection  . H/O: hypertension  . H/O hyperlipidemia  . Leukocytosis  . HAP (hospital-acquired pneumonia)  . Protein-calorie malnutrition, severe     [name]  72 y.o. female  12 Days Post-Op  Procedure(s): LAPAROSCOPY DIAGNOSTIC EXPLORATORY LAPAROTOMY LAPAROSCOPIC LYSIS OF ADHESIONS LYSIS OF  ADHESION    Non consistently opening up s/p LOA for chronic SBO.  Recurrent SBO vs ileus  Plan:  -CT scan abd/pelvis to r/o abscess, recurrent SBO, etc -NGT -TNA for severe malnutrition & prolonged ileus -VTE prophylaxis- SCDs, etc -mobilize as tolerated to help recovery -severe COPD - stable.  Reoperation very risky...but persistent ileus/SBO not good long-term  Ardeth Sportsman, M.D., F.A.C.S. Gastrointestinal and Minimally Invasive Surgery [facility] [address] [place], [place] [address] [phone] Main / Paging [phone] Voice Mail   11/19/2012  CARE TEAM:  PCP: Mickie Hillier, MD  Outpatient Care Team: Patient Care Team: Catha Gosselin, MD as PCP - General (Family Medicine)  Inpatient Treatment Team: Treatment Team: Attending Provider: Merwyn Katos, MD; Consulting Physician: Bishop Limbo, MD; Rounding Team: Md Pccm, MD; Dietitian: Lavena Bullion, RD; Registered Nurse: Jaclynn Major, RN; Registered Nurse: Liborio Nixon, RN; Technician: Gwenlyn Perking, [place]; Technician: Isaias Cowman, NT; Respiratory Therapist: Liz Beach, RRT; Registered Nurse: Jacquelyne Balint, RN   Results:   Labs: Results for orders placed during the hospital encounter of 10/29/12 (from the past 48 hour(s))  GLUCOSE, CAPILLARY     Status: Abnormal   Collection Time   11/17/12 12:16 PM      Component Value Range Comment   Glucose-Capillary 158 (*) 70 - 99 mg/dL    Comment 1 Documented in Chart      Comment  2 Notify RN     GLUCOSE, CAPILLARY     Status: Abnormal   Collection Time   11/17/12  5:35 PM      Component Value Range Comment   Glucose-Capillary 117 (*) 70 - 99 mg/dL    Comment 1 Documented in Chart      Comment 2 Notify RN     GLUCOSE, CAPILLARY     Status: Abnormal   Collection Time   11/17/12 11:51 PM      Component Value Range Comment   Glucose-Capillary 125 (*) 70 - 99 mg/dL   COMPREHENSIVE METABOLIC PANEL      Status: Abnormal   Collection Time   11/18/12  5:10 AM      Component Value Range Comment   Sodium 135  135 - 145 mEq/L    Potassium 4.3  3.5 - 5.1 mEq/L    Chloride 93 (*) 96 - 112 mEq/L    CO2 39 (*) 19 - 32 mEq/L    Glucose, Bld 132 (*) 70 - 99 mg/dL    BUN 16  6 - 23 mg/dL    Creatinine, Ser 1.61 (*) 0.50 - 1.10 mg/dL    Calcium 9.2  8.4 - 09.6 mg/dL    Total Protein 4.9 (*) 6.0 - 8.3 g/dL    Albumin 1.9 (*) 3.5 - 5.2 g/dL    AST 36  0 - 37 U/L    ALT 30  0 - 35 U/L    Alkaline Phosphatase 110  39 - 117 U/L    Total Bilirubin 0.4  0.3 - 1.2 mg/dL    GFR calc non Af Amer >90  >90 mL/min    GFR calc Af Amer >90  >90 mL/min   MAGNESIUM     Status: Normal   Collection Time   11/18/12  5:10 AM      Component Value Range Comment   Magnesium 2.0  1.5 - 2.5 mg/dL   PHOSPHORUS     Status: Normal   Collection Time   11/18/12  5:10 AM      Component Value Range Comment   Phosphorus 3.3  2.3 - 4.6 mg/dL   CBC     Status: Abnormal   Collection Time   11/18/12  5:10 AM      Component Value Range Comment   WBC 12.1 (*) 4.0 - 10.5 K/uL    RBC 2.64 (*) 3.87 - 5.11 MIL/uL    Hemoglobin 8.2 (*) 12.0 - 15.0 g/dL    HCT 04.5 (*) 40.9 - 46.0 %    MCV 99.6  78.0 - 100.0 fL    MCH 31.1  26.0 - 34.0 pg    MCHC 31.2  30.0 - 36.0 g/dL    RDW 81.1  91.4 - 78.2 %    Platelets 440 (*) 150 - 400 K/uL   DIFFERENTIAL     Status: Abnormal   Collection Time   11/18/12  5:10 AM      Component Value Range Comment   Neutrophils Relative 74  43 - 77 %    Neutro Abs 9.0 (*) 1.7 - 7.7 K/uL    Lymphocytes Relative 15  12 - 46 %    Lymphs Abs 1.8  0.7 - 4.0 K/uL    Monocytes Relative 9  3 - 12 %    Monocytes Absolute 1.0  0.1 - 1.0 K/uL    Eosinophils Relative 2  0 - 5 %    Eosinophils Absolute 0.3  0.0 -  0.7 K/uL    Basophils Relative 0  0 - 1 %    Basophils Absolute 0.0  0.0 - 0.1 K/uL   CHOLESTEROL, TOTAL     Status: Normal   Collection Time   11/18/12  5:10 AM      Component Value Range Comment    Cholesterol 108  0 - 200 mg/dL   TRIGLYCERIDES     Status: Normal   Collection Time   11/18/12  5:10 AM      Component Value Range Comment   Triglycerides 105  <150 mg/dL   PREALBUMIN     Status: Abnormal   Collection Time   11/18/12  5:10 AM      Component Value Range Comment   Prealbumin 10.4 (*) 17.0 - 34.0 mg/dL   GLUCOSE, CAPILLARY     Status: Abnormal   Collection Time   11/18/12  6:43 AM      Component Value Range Comment   Glucose-Capillary 163 (*) 70 - 99 mg/dL   GLUCOSE, CAPILLARY     Status: Abnormal   Collection Time   11/18/12 12:00 PM      Component Value Range Comment   Glucose-Capillary 141 (*) 70 - 99 mg/dL    Comment 1 Documented in Chart      Comment 2 Notify RN     GLUCOSE, CAPILLARY     Status: Abnormal   Collection Time   11/18/12  6:11 PM      Component Value Range Comment   Glucose-Capillary 124 (*) 70 - 99 mg/dL   GLUCOSE, CAPILLARY     Status: Abnormal   Collection Time   11/18/12 11:32 PM      Component Value Range Comment   Glucose-Capillary 168 (*) 70 - 99 mg/dL    Comment 1 Notify RN     BASIC METABOLIC PANEL     Status: Abnormal   Collection Time   11/19/12  6:10 AM      Component Value Range Comment   Sodium 135  135 - 145 mEq/L    Potassium 4.6  3.5 - 5.1 mEq/L    Chloride 93 (*) 96 - 112 mEq/L    CO2 39 (*) 19 - 32 mEq/L    Glucose, Bld 170 (*) 70 - 99 mg/dL    BUN 20  6 - 23 mg/dL    Creatinine, Ser 1.61 (*) 0.50 - 1.10 mg/dL    Calcium 9.6  8.4 - 09.6 mg/dL    GFR calc non Af Amer >90  >90 mL/min    GFR calc Af Amer >90  >90 mL/min   GLUCOSE, CAPILLARY     Status: Abnormal   Collection Time   11/19/12  6:28 AM      Component Value Range Comment   Glucose-Capillary 171 (*) 70 - 99 mg/dL     Imaging / Studies: Dg Abd Acute W/chest  11/18/2012  *RADIOLOGY REPORT*  Clinical Data: Abdominal pain  ACUTE ABDOMEN SERIES (ABDOMEN 2 VIEW & CHEST 1 VIEW)  Comparison: Most recent prior abdominal radiographs 11/14/2012  Findings: A nasogastric  tube is present, the tip is just within the stomach and the proximal side port in the region of the gastroesophageal junction.  Unchanged position right upper extremity PICC.  The tip projects over the mid superior vena cava. Improving gaseous distension of the colon and small bowel.  No free air identified on the lateral decubitus view.  There are several small air fluid levels.  Midline abdominal staples  suggest recent midline incision.  The patient is rotated to the left exaggerate the contours of the right hilum.  Chronic underlying lung changes appears similar to prior.  IMPRESSION:  1.  Improving gaseous distension of small and large bowel compared to 11/14/2012.  No evidence of free air on the lateral decubitus view. 2.  The nasogastric tube has pulled back, the tip is now just within the gastric fundus and the proximal side port in the GE junction.  Consider advancing several centimeters for more optimal placement.   Original Report Authenticated By: Malachy Moan, M.D.     Medications / Allergies: per chart  Antibiotics: Anti-infectives     Start     Dose/Rate Route Frequency Ordered Stop   11/09/12 0000   vancomycin (VANCOCIN) 750 mg in sodium chloride 0.9 % 150 mL IVPB  Status:  Discontinued        750 mg 150 mL/hr over 60 Minutes Intravenous Every 12 hours 11/08/12 1023 11/09/12 1152   11/08/12 1200  piperacillin-tazobactam (ZOSYN) IVPB 3.375 g       3.375 g 12.5 mL/hr over 240 Minutes Intravenous Every 8 hours 11/08/12 1023 11/15/12 0730   11/08/12 1100   vancomycin (VANCOCIN) IVPB 1000 mg/200 mL premix        1,000 mg 200 mL/hr over 60 Minutes Intravenous  Once 11/08/12 1023 11/08/12 1322   11/04/12 1200   levofloxacin (LEVAQUIN) IVPB 500 mg  Status:  Discontinued        500 mg 100 mL/hr over 60 Minutes Intravenous Daily 11/04/12 1124 11/05/12 1436   11/01/12 1615   fluconazole (DIFLUCAN) tablet 200 mg        200 mg Oral  Once 11/01/12 1607 11/01/12 1636   11/01/12 1000    levofloxacin (LEVAQUIN) IVPB 750 mg  Status:  Discontinued        750 mg 100 mL/hr over 90 Minutes Intravenous Every 48 hours 10/30/12 1108 10/31/12 1428   11/01/12 1000   levofloxacin (LEVAQUIN) tablet 750 mg  Status:  Discontinued        750 mg Oral Every 48 hours 10/31/12 1428 11/04/12 1124   10/29/12 1045   levofloxacin (LEVAQUIN) IVPB 750 mg  Status:  Discontinued        750 mg 100 mL/hr over 90 Minutes Intravenous Every 24 hours 10/29/12 1034 10/30/12 1108

## 2012-11-20 ENCOUNTER — Inpatient Hospital Stay (HOSPITAL_COMMUNITY): Payer: Medicare Other

## 2012-11-20 LAB — CBC
HCT: 25.1 % — ABNORMAL LOW (ref 36.0–46.0)
Hemoglobin: 7.9 g/dL — ABNORMAL LOW (ref 12.0–15.0)
WBC: 10.6 10*3/uL — ABNORMAL HIGH (ref 4.0–10.5)

## 2012-11-20 LAB — GLUCOSE, CAPILLARY: Glucose-Capillary: 208 mg/dL — ABNORMAL HIGH (ref 70–99)

## 2012-11-20 LAB — COMPREHENSIVE METABOLIC PANEL
ALT: 22 U/L (ref 0–35)
Alkaline Phosphatase: 136 U/L — ABNORMAL HIGH (ref 39–117)
BUN: 20 mg/dL (ref 6–23)
CO2: 38 mEq/L — ABNORMAL HIGH (ref 19–32)
Calcium: 9.5 mg/dL (ref 8.4–10.5)
GFR calc Af Amer: >90 mL/min (ref >90)
GFR calc non Af Amer: >90 mL/min (ref >90)
Glucose, Bld: 135 mg/dL — ABNORMAL HIGH (ref 70–99)
Sodium: 136 mEq/L (ref 135–145)

## 2012-11-20 LAB — PROTIME-INR: INR: 0.89 (ref 0.00–1.49)

## 2012-11-20 LAB — APTT: aPTT: 46 seconds — ABNORMAL HIGH (ref 24–37)

## 2012-11-20 MED ORDER — FAT EMULSION 20 % IV EMUL
250.0000 mL | INTRAVENOUS | Status: AC
  Administered 2012-11-20: 250 mL via INTRAVENOUS
  Filled 2012-11-20: qty 250

## 2012-11-20 MED ORDER — FENTANYL CITRATE 0.05 MG/ML IJ SOLN
INTRAMUSCULAR | Status: AC | PRN
  Administered 2012-11-20: 50 ug via INTRAVENOUS
  Administered 2012-11-20: 25 ug via INTRAVENOUS

## 2012-11-20 MED ORDER — HEPARIN SODIUM (PORCINE) 5000 UNIT/ML IJ SOLN
5000.0000 [IU] | Freq: Three times a day (TID) | INTRAMUSCULAR | Status: DC
  Administered 2012-11-20 – 2012-11-29 (×26): 5000 [IU] via SUBCUTANEOUS
  Filled 2012-11-20 (×29): qty 1

## 2012-11-20 MED ORDER — MIDAZOLAM HCL 2 MG/2ML IJ SOLN
INTRAMUSCULAR | Status: AC | PRN
  Administered 2012-11-20: 1 mg via INTRAVENOUS

## 2012-11-20 MED ORDER — TRACE MINERALS CR-CU-F-FE-I-MN-MO-SE-ZN IV SOLN
INTRAVENOUS | Status: AC
  Administered 2012-11-20: 18:00:00 via INTRAVENOUS
  Filled 2012-11-20: qty 2000

## 2012-11-20 NOTE — Progress Notes (Signed)
Agree with note per NP Minor.  Her respiratory status is holding up better than expected, but she is at high risk.  Will continue to follow closely, keep on same BD regimen.

## 2012-11-20 NOTE — Progress Notes (Signed)
Pt went to CT for aspiration of fluid. Pt came back to unit with 2 skin tears on the left hand. RN got report that pt had a stage 11  Ulcer in sacral area.  Receiving RN reassessed Pt and did not find any ulcers on patients sacrum. RN  applied tergerdem to protect patients skin on left hand.

## 2012-11-20 NOTE — Progress Notes (Signed)
PT Cancellation Note  Patient Details Name: [name] MRN: [id number] DOB: 1941/10/12   Cancelled Treatment:    Reason Eval/Treat Not Completed: Medical issues which prohibited therapy Pt just back to room from CT guided drainage catheter placement into pelvis and RN stated to cancel therapy today.    Calieb Lichtman,KATHrine E 11/20/2012, 2:49 PM Pager: [phone]

## 2012-11-20 NOTE — Progress Notes (Signed)
Patient ID: ELLIANAH CORDY, female   DOB: [birth year], 72 y.o.   MRN: [id number] Request received for CT guided aspiration/possible drainage of pelvic cul de sac fluid collection today. Pt is s/p expl lap with LOA for SBO 11/07/12. Imaging studies were reviewed by Dr. Grace Isaac. Additional PMH as below. Exam: pt awake/alert; chest- distant BS bilat; heart-tachy, reg rhythm; abd- soft, mildly distended,few BS, clean midline incision, mild tenderness; ext- 1-2 + edema .    Filed Vitals:   11/19/12 1926 11/19/12 2120 11/20/12 0545 11/20/12 0745  BP:  93/47 125/77   Pulse:  90 114   Temp:  98.2 F (36.8 C) 98.2 F (36.8 C)   TempSrc:  Oral Oral   Resp:  20 20   Height:      Weight:      SpO2: 98% 100% 97% 99%   Past Medical History  Diagnosis Date  . Emphysema   . 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 Hyperlipidemia   . Hypertension    Past Surgical History  Procedure Date  . Abdominal hysterectomy   . Laparoscopy 11/07/2012    Procedure: LAPAROSCOPY DIAGNOSTIC;  Surgeon: Axel Filler, MD;  Location: [facility];  Service: General;  Laterality: N/A;  . Laparotomy 11/07/2012    Procedure: EXPLORATORY LAPAROTOMY;  Surgeon: Axel Filler, MD;  Location: [facility];  Service: General;  Laterality: N/A;  . Laparoscopic lysis of adhesions 11/07/2012    Procedure: LAPAROSCOPIC LYSIS OF ADHESIONS;  Surgeon: Axel Filler, MD;  Location: [facility];  Service: General;  Laterality: N/A;  . Lysis of adhesion 11/07/2012    Procedure: LYSIS OF ADHESION;  Surgeon: Axel Filler, MD;  Location: [facility];  Service: General;;   Dg Chest 1 View  11/07/2012  *RADIOLOGY REPORT*  Clinical Data: Respiratory failure.  CHEST - 1 VIEW  Comparison: 10/29/2012.  Findings: ET tube has been inserted and lies with its tip 6.2 cm above carina.  Central venous catheter tip from right IJ approach lies with its tip at the distal SVC.  There is no pneumothorax. Nasogastric tube tip in stomach.  No infiltrates or significant atelectasis.  IMPRESSION: ET  tube, central venous catheter, nasogastric tube all good position.  No pneumothorax.   Original Report Authenticated By: Davonna Belling, M.D.    Dg Chest 2 View  10/29/2012  *RADIOLOGY REPORT*  Clinical Data: Shortness of breath  CHEST - 2 VIEW  Comparison: 08/10/2008  Findings: Cardiomediastinal silhouette is stable.  No acute infiltrate or pleural effusion.  No pulmonary edema.  Mild hyperinflation again noted.  Thoracic spine osteopenia.  Mild degenerative changes lower thoracic spine.  IMPRESSION:  No active disease.  Mild hyperinflation again noted.   Original Report Authenticated By: Natasha Mead, M.D.    Dg Abd 1 View  11/06/2012  *RADIOLOGY REPORT*  Clinical Data: The NG tube placement  ABDOMEN - 1 VIEW  Comparison: Earlier the same day  Findings: 1115 hours.  NG tube has been advanced in the interval. The tip overlies the proximal stomach with proximal port of the NG tube now positioned at the esophagogastric junction.  The diffuse gaseous small bowel dilatation persists.  IMPRESSION: NG tube tip is now in the proximal stomach with the side port of the NG tube at the EG junction.  Persistent diffuse gaseous small bowel dilatation consistent with obstruction.   Original Report Authenticated By: Kennith Center, M.D.    Ct Abdomen Pelvis W Contrast  11/19/2012  *RADIOLOGY REPORT*  Clinical Data: Postop from lysis of  adhesions for small bowel obstruction.  Worsening abdominal pain and bloating.  Personal history of ovarian carcinoma.  CT ABDOMEN AND PELVIS WITH CONTRAST  Technique:  Multidetector CT imaging of the abdomen and pelvis was performed following the standard protocol during bolus administration of intravenous contrast.  Contrast: OMNIPAQUE IOHEXOL 300 MG/ML  SOLN  Comparison: 11/05/2012  Findings: A small right pleural effusion persists but is decreased in size since previous study.  There has been resolution of small bowel obstruction since previous study.  Nasogastric tube is seen within the  stomach. A small rim enhancing fluid collection persists in the cul-de-sac which measures 2 x 6 cm.  This could represent free fluid although a infected postop collection cannot definitely be excluded.  Diffuse body wall edema is noted but no other abnormal fluid collections are seen.  Tiny sub-centimeter cysts again seen in the left hepatic lobe.  No liver masses are identified.  The gallbladder, pancreas, spleen, adrenal glands, and kidneys are normal in appearance.  No evidence of hydronephrosis.  No soft tissue mass or lymphadenopathy identified.  Previous hysterectomy again noted.  IMPRESSION:  1.  Resolution of small bowel obstruction since previous study. 2.  2 x 6 cm persistent fluid collection in pelvic cul-de-sac, which could represent free fluid although a small postop abscess cannot definitely be excluded. 3.  Decreased size of small right pleural effusion.   Original Report Authenticated By: Myles Rosenthal, M.D.    Ct Abdomen Pelvis W Contrast  11/05/2012  **ADDENDUM** CREATED: 11/05/2012 14:52:43  The transition point of the small bowel obstruction is in the region of the mid-distal ileum rather than the distal ileum as initially dictated.  **END ADDENDUM** SIGNED BY: Almedia Balls. Constance Goltz, M.D.   11/05/2012  *RADIOLOGY REPORT*  Clinical Data: Lower abdominal pain.  Bloating for 3-4 years. Ovarian cancer diagnosed 1989.  Post total hysterectomy.  Question rectal mass.  CT ABDOMEN AND PELVIS WITH CONTRAST  Technique:  Multidetector CT imaging of the abdomen and pelvis was performed following the standard protocol during bolus administration of intravenous contrast.  Contrast: OMNIPAQUE IOHEXOL 300 MG/ML  SOLN  Comparison: 11/05/2012 plain film examination.  01/04/2004 CT.  Findings: Gas and fluid filled dilated small bowel loops to the level of the distal ileum.  There is an abrupt change caliber of the distal ileum in the pelvis without mass identified.  This may be related to adhesions in this  patient who has had a prior hysterectomy.  Fluid in the pelvis and surrounding the liver without nodularity as may be expected if this were related to the recurrent ovarian tumor.  This fluid is therefore may be related to the bowel obstruction.  Presently, no evidence of pneumatosis or free intraperitoneal air.  No evidence of rectal mass.  Decompressed colon.  The ligament of Treitz is located  midline.  This may be related to ligamentum laxity rather than malrotation.  Separate origin of the hepatic artery and splenic artery from the aorta.  Atherosclerotic type changes of the aorta with ectasia. Atherosclerotic type changes iliac arteries and femoral arteries.  Pleural effusion and basilar atelectasis greater on the right.  Tiny low density structure within the left lobe liver and right kidney may be cysts although too small to adequately characterize. No worrisome hepatic, splenic, pancreatic, adrenal or renal lesion.  Mild enhancement of the fundus of the gallbladder.  No calcified gallstone.  No bony destructive lesion.  No adenopathy.  Small hiatal hernia.  IMPRESSION: Small bowel obstruction  with dilated small bowel to the level of the distal ileum in the region of the pelvis where there is abrupt change of caliber which may be related to adhesions.  Please see above discussion.  This has been made a PRA call report utilizing dashboard call feature.   Original Report Authenticated By: Lacy Duverney, M.D.    Dg Chest Port 1 View  11/11/2012  *RADIOLOGY REPORT*  Clinical Data: Respiratory failure.  Emphysema.  PORTABLE CHEST - 1 VIEW  Comparison: Chest 11/08/2012 and 11/09/2012.  Findings: Support tubes and lines are unchanged.  The lungs are emphysematous.  Right basilar airspace disease continues to improve.  No pneumothorax is identified.  IMPRESSION: Continued improvement in right basilar airspace disease.  No new abnormality.   Original Report Authenticated By: Holley Dexter, M.D.    Dg Chest Port  1 View  11/09/2012  *RADIOLOGY REPORT*  Clinical Data: Emphysema.  Post extubation.  PORTABLE CHEST - 1 VIEW  Comparison: 11/08/2012  Findings: Endotracheal tube has been removed.  Nasogastric tube tip in the stomach body.  Right IJ line tip:  SVC.  Emphysema noted.  Evolving indistinct density at the right lung base, possibly from atelectasis, early pneumonia, or small layering effusion. Heart size within normal limits.  Atherosclerotic calcification of the aortic arch.  IMPRESSION:  1.  Emphysema. 2.  Vague density at the right lung base, potentially representing a small layering right-sided pleural effusion, mild atelectasis, or early pneumonia.   Original Report Authenticated By: Gaylyn Rong, M.D.    Dg Chest Port 1 View  11/08/2012  *RADIOLOGY REPORT*  Clinical Data: Endotracheal tube placement.  PORTABLE CHEST - 1 VIEW  Comparison: Chest radiograph performed 11/07/2012  Findings: The patient's endotracheal tube is seen ending 4-5 cm above the carina.  An enteric tube is noted extending below the diaphragm.  A right IJ line is noted ending about the mid SVC.  Lucency at the upper lung zones suggests underlying emphysema.  The lungs are otherwise grossly clear.  No focal consolidation, pleural effusion or pneumothorax is seen.  The cardiomediastinal silhouette is normal in size; calcification is noted within the aortic arch.  No acute osseous abnormalities are identified.  IMPRESSION:  1.  Endotracheal tube seen ending 4-5 cm above the carina. 2.  Underlying emphysema noted; lungs otherwise grossly clear.   Original Report Authenticated By: Tonia Ghent, M.D.    Dg Abd 2 Views  11/13/2012  *RADIOLOGY REPORT*  Clinical Data: Postop ileus.  NG tube placement.  ABDOMEN - 2 VIEW  Comparison: 11/11/2012  Findings: The NG tube is in place with the tip in the descending duodenum.  Side port is in the proximal duodenum.  Mild gaseous distention of bowel persists, likely involving both large and small bowel  compatible with ileus.  No free air.   No organomegaly. Visualized lung bases are clear.  No acute bony abnormality.  IMPRESSION: Continued mild gaseous distention of bowel, likely mild ileus.  NG tube tip is in the descending duodenum.   Original Report Authenticated By: Charlett Nose, M.D.    Dg Abd 2 Views  11/11/2012  *RADIOLOGY REPORT*  Clinical Data: Postoperative radiograph.  Lysis of adhesions. Bowel obstruction.  Persistent elevation of nasogastric tube output.  ABDOMEN - 2 VIEW  Comparison: 11/06/2012.  Findings: Nasogastric tube remains present in the proximal stomach. Proximal side port appears to be at the gastroesophageal junction. Fluid levels are present within large and small bowel, most compatible with postoperative ileus.  No plain film  evidence of free air.  Bowel gas extends along the left flank, in the descending colon.  Midline skin staples are present.  Compared to the prior exam of 11/06/2012, the bowel gas pattern is Normalizing.  IMPRESSION: Scattered air fluid levels within large and small bowel and mild gaseous distention, likely representing postoperative ileus. Nasogastric tube in the proximal stomach.   Original Report Authenticated By: Andreas Newport, M.D.    Dg Abd 2 Views  11/06/2012  *RADIOLOGY REPORT*  Clinical Data: Abdominal distention.  ABDOMEN - 2 VIEW  Comparison: 11/05/2012  Findings: Upright film shows no evidence for intraperitoneal free air.  There is marked diffuse small bowel dilatation measuring up to 5.0 cm in diameter.  Although the distribution of bowel loops is slightly different in the interval, there is been no substantial improvement or progression since the prior study.  Of note, the NG tube tip is in the distal esophagus.  IMPRESSION: Persistent diffuse gaseous small bowel dilatation with air fluid levels, consistent with obstruction.  No substantial interval change.  NG tube tip is in the distal esophagus.  I personally called these results to the  patient's nurse, Toniann Fail, at 1005 hours on 10/06/2013.   Original Report Authenticated By: Kennith Center, M.D.    Dg Abd 2 Views  11/05/2012  **ADDENDUM** CREATED: 11/05/2012 14:36:08  In the original dictation for this examination, there is a voice recognition error in the conclusion.  The impression should read:  1.  Findings, as above, compatible with persistent and slightly worsened small bowel obstruction.  Given the presence of some distal rectal GAS, this may be a partial obstruction.  **END ADDENDUM** SIGNED BY: Florencia Reasons, M.D.   11/05/2012  *RADIOLOGY REPORT*  Clinical Data: Follow-up evaluation of small bowel obstruction.  ABDOMEN - 2 VIEW  Comparison: Chest trait 11/04/2012.  Findings: Previously noted nasogastric tube has been removed. There is a paucity of colonic gas, although there is increased rectal gas compared to yesterday's examination.  Numerous dilated loops of small bowel are again noted, measuring up to approximately 5 cm in diameter.  Multiple air fluid levels are noted on the upright projection.  No definite pneumoperitoneum.  IMPRESSION: 1.  Findings, as above, compatible with persistent and slightly worsened small bowel obstruction.  Given the presence of some distal rectal mass, this may be a partial obstruction.   Original Report Authenticated By: Trudie Reed, M.D.    Dg Abd 2 Views  11/04/2012  *RADIOLOGY REPORT*  Clinical Data: Small bowel obstruction  ABDOMEN - 2 VIEW  Comparison: 11/03/2012  Findings: Upright film shows no evidence for intraperitoneal free air.  To NG tube tip is just distal to the esophagogastric junction.  Proximal port the NG tube is in the distal esophagus. Diffuse gaseous small bowel distention persists without substantial interval change.  Small bowel loops measure up to 4.1 cm in diameter.  IMPRESSION: No substantial interval change and diffuse gaseous small bowel dilatation consistent with obstruction.   Original Report Authenticated By:  Kennith Center, M.D.    Dg Abd 2 Views  11/03/2012  *RADIOLOGY REPORT*  Clinical Data: Follow up small bowel obstruction  ABDOMEN - 2 VIEW  Comparison: Prior KUB 11/02/2012  Findings: Similar appearance of multiple loops of air filled and dilated small bowel throughout the abdomen with multiple small air fluid levels on the upright view.  Overall, findings are similar to slightly progressed.  No evidence of free air, or pneumatosis.  No acute osseous abnormality.  Visualized lung bases  are clear.  IMPRESSION:  Similar to slightly progressed small bowel obstruction.   Original Report Authenticated By: Malachy Moan, M.D.    Dg Abd 2 Views  11/02/2012  *RADIOLOGY REPORT*  Clinical Data: Constipation, abdominal pain, bloating.  ABDOMEN - 2 VIEW  Comparison: 07/22/2012  Findings:  Dilated small bowel loops throughout the abdomen and pelvis concerning for small bowel obstruction.  Gas within nondistended colon.  No organomegaly or free air.  No acute bony abnormality.  IMPRESSION: Dilated small bowel loops concerning for small bowel obstruction.   Original Report Authenticated By: Charlett Nose, M.D.    Dg Abd Acute W/chest  11/18/2012  *RADIOLOGY REPORT*  Clinical Data: Abdominal pain  ACUTE ABDOMEN SERIES (ABDOMEN 2 VIEW & CHEST 1 VIEW)  Comparison: Most recent prior abdominal radiographs 11/14/2012  Findings: A nasogastric tube is present, the tip is just within the stomach and the proximal side port in the region of the gastroesophageal junction.  Unchanged position right upper extremity PICC.  The tip projects over the mid superior vena cava. Improving gaseous distension of the colon and small bowel.  No free air identified on the lateral decubitus view.  There are several small air fluid levels.  Midline abdominal staples suggest recent midline incision.  The patient is rotated to the left exaggerate the contours of the right hilum.  Chronic underlying lung changes appears similar to prior.  IMPRESSION:   1.  Improving gaseous distension of small and large bowel compared to 11/14/2012.  No evidence of free air on the lateral decubitus view. 2.  The nasogastric tube has pulled back, the tip is now just within the gastric fundus and the proximal side port in the GE junction.  Consider advancing several centimeters for more optimal placement.   Original Report Authenticated By: Malachy Moan, M.D.    Dg Abd Acute W/chest  11/14/2012  *RADIOLOGY REPORT*  Clinical Data: Postoperative ileus.  Nausea and abdominal distention.  ACUTE ABDOMEN SERIES (ABDOMEN 2 VIEW & CHEST 1 VIEW)  Comparison: 11/13/2012.  Findings: The upright chest x-ray demonstrates a stable NG tube. The right IJ catheter has been removed.  A right PICC line is in place.  Tip is in the mid SVC.  The cardiac silhouette, mediastinal and hilar contours are within normal limits and stable.  The right lung base demonstrates streaky atelectasis.  No pleural effusions or pneumothorax.  Two views of the abdomen demonstrate mild persistent gaseous distention of the bowel.  Findings likely a postoperative ileus. No free air.  IMPRESSION: Persistent air distended bowel, most likely postoperative ileus.   Original Report Authenticated By: Rudie Meyer, M.D.   Results for orders placed during the hospital encounter of 10/29/12  CBC      Component Value Range   WBC 6.5  4.0 - 10.5 K/uL   RBC 3.79 (*) 3.87 - 5.11 MIL/uL   Hemoglobin 12.2  12.0 - 15.0 g/dL   HCT 16.1  09.6 - 04.5 %   MCV 96.3  78.0 - 100.0 fL   MCH 32.2  26.0 - 34.0 pg   MCHC 33.4  30.0 - 36.0 g/dL   RDW 40.9  81.1 - 91.4 %   Platelets 242  150 - 400 K/uL  BASIC METABOLIC PANEL      Component Value Range   Sodium 138  135 - 145 mEq/L   Potassium 4.1  3.5 - 5.1 mEq/L   Chloride 98  96 - 112 mEq/L   CO2 29  19 - 32  mEq/L   Glucose, Bld 188 (*) 70 - 99 mg/dL   BUN 12  6 - 23 mg/dL   Creatinine, Ser 1.19  0.50 - 1.10 mg/dL   Calcium 9.7  8.4 - 14.7 mg/dL   GFR calc non Af Amer 89  (*) >90 mL/min   GFR calc Af Amer >90  >90 mL/min  PRO B NATRIURETIC PEPTIDE      Component Value Range   Pro B Natriuretic peptide (BNP) 352.9 (*) 0 - 125 pg/mL  TSH      Component Value Range   TSH 0.974  0.350 - 4.500 uIU/mL  TROPONIN I      Component Value Range   Troponin I <0.30  <0.30 ng/mL  TROPONIN I      Component Value Range   Troponin I <0.30  <0.30 ng/mL  TROPONIN I      Component Value Range   Troponin I <0.30  <0.30 ng/mL  HEMOGLOBIN A1C      Component Value Range   Hemoglobin A1C 5.9 (*) <5.7 %   Mean Plasma Glucose 123 (*) <117 mg/dL  BASIC METABOLIC PANEL      Component Value Range   Sodium 125 (*) 135 - 145 mEq/L   Potassium 4.4  3.5 - 5.1 mEq/L   Chloride 90 (*) 96 - 112 mEq/L   CO2 28  19 - 32 mEq/L   Glucose, Bld 125 (*) 70 - 99 mg/dL   BUN 16  6 - 23 mg/dL   Creatinine, Ser 8.29  0.50 - 1.10 mg/dL   Calcium 9.4  8.4 - 56.2 mg/dL   GFR calc non Af Amer 86 (*) >90 mL/min   GFR calc Af Amer >90  >90 mL/min  CBC      Component Value Range   WBC 8.0  4.0 - 10.5 K/uL   RBC 3.15 (*) 3.87 - 5.11 MIL/uL   Hemoglobin 9.9 (*) 12.0 - 15.0 g/dL   HCT 13.0 (*) 86.5 - 78.4 %   MCV 94.6  78.0 - 100.0 fL   MCH 32.1  26.0 - 34.0 pg   MCHC 33.9  30.0 - 36.0 g/dL   RDW 69.6  29.5 - 28.4 %   Platelets 209  150 - 400 K/uL  VITAMIN B12      Component Value Range   Vitamin B-12 785  211 - 911 pg/mL  FOLATE      Component Value Range   Folate >20.0    IRON AND TIBC      Component Value Range   Iron 38 (*) 42 - 135 ug/dL   TIBC 132  440 - 102 ug/dL   Saturation Ratios 11 (*) 20 - 55 %   UIBC 304  125 - 400 ug/dL  FERRITIN      Component Value Range   Ferritin 115  10 - 291 ng/mL  RETICULOCYTES      Component Value Range   Retic Ct Pct 2.0  0.4 - 3.1 %   RBC. 3.35 (*) 3.87 - 5.11 MIL/uL   Retic Count, Manual 67.0  19.0 - 186.0 K/uL  OSMOLALITY, URINE      Component Value Range   Osmolality, Ur 123 (*) 390 - 1090 mOsm/kg  OSMOLALITY      Component Value  Range   Osmolality 268 (*) 275 - 300 mOsm/kg  SODIUM, URINE, RANDOM      Component Value Range   Sodium, Ur 10    BASIC METABOLIC PANEL  Component Value Range   Sodium 127 (*) 135 - 145 mEq/L   Potassium 4.4  3.5 - 5.1 mEq/L   Chloride 93 (*) 96 - 112 mEq/L   CO2 25  19 - 32 mEq/L   Glucose, Bld 102 (*) 70 - 99 mg/dL   BUN 23  6 - 23 mg/dL   Creatinine, Ser 6.21  0.50 - 1.10 mg/dL   Calcium 8.6  8.4 - 30.8 mg/dL   GFR calc non Af Amer 88 (*) >90 mL/min   GFR calc Af Amer >90  >90 mL/min  CBC      Component Value Range   WBC 12.5 (*) 4.0 - 10.5 K/uL   RBC 3.49 (*) 3.87 - 5.11 MIL/uL   Hemoglobin 11.1 (*) 12.0 - 15.0 g/dL   HCT 65.7 (*) 84.6 - 96.2 %   MCV 92.0  78.0 - 100.0 fL   MCH 31.8  26.0 - 34.0 pg   MCHC 34.6  30.0 - 36.0 g/dL   RDW 95.2  84.1 - 32.4 %   Platelets 226  150 - 400 K/uL  BASIC METABOLIC PANEL      Component Value Range   Sodium 137  135 - 145 mEq/L   Potassium 4.3  3.5 - 5.1 mEq/L   Chloride 101  96 - 112 mEq/L   CO2 30  19 - 32 mEq/L   Glucose, Bld 122 (*) 70 - 99 mg/dL   BUN 18  6 - 23 mg/dL   Creatinine, Ser 4.01  0.50 - 1.10 mg/dL   Calcium 8.9  8.4 - 02.7 mg/dL   GFR calc non Af Amer 88 (*) >90 mL/min   GFR calc Af Amer >90  >90 mL/min  BASIC METABOLIC PANEL      Component Value Range   Sodium 133 (*) 135 - 145 mEq/L   Potassium 4.5  3.5 - 5.1 mEq/L   Chloride 97  96 - 112 mEq/L   CO2 29  19 - 32 mEq/L   Glucose, Bld 116 (*) 70 - 99 mg/dL   BUN 18  6 - 23 mg/dL   Creatinine, Ser 2.53  0.50 - 1.10 mg/dL   Calcium 8.7  8.4 - 66.4 mg/dL   GFR calc non Af Amer 90 (*) >90 mL/min   GFR calc Af Amer >90  >90 mL/min  BASIC METABOLIC PANEL      Component Value Range   Sodium 132 (*) 135 - 145 mEq/L   Potassium 4.9  3.5 - 5.1 mEq/L   Chloride 96  96 - 112 mEq/L   CO2 30  19 - 32 mEq/L   Glucose, Bld 93  70 - 99 mg/dL   BUN 20  6 - 23 mg/dL   Creatinine, Ser 4.03  0.50 - 1.10 mg/dL   Calcium 8.7  8.4 - 47.4 mg/dL   GFR calc non Af Amer 86  (*) >90 mL/min   GFR calc Af Amer >90  >90 mL/min  CBC      Component Value Range   WBC 13.9 (*) 4.0 - 10.5 K/uL   RBC 3.74 (*) 3.87 - 5.11 MIL/uL   Hemoglobin 11.8 (*) 12.0 - 15.0 g/dL   HCT 25.9 (*) 56.3 - 87.5 %   MCV 94.7  78.0 - 100.0 fL   MCH 31.6  26.0 - 34.0 pg   MCHC 33.3  30.0 - 36.0 g/dL   RDW 64.3  32.9 - 51.8 %   Platelets 209  150 -  400 K/uL  BASIC METABOLIC PANEL      Component Value Range   Sodium 135  135 - 145 mEq/L   Potassium 4.7  3.5 - 5.1 mEq/L   Chloride 97  96 - 112 mEq/L   CO2 31  19 - 32 mEq/L   Glucose, Bld 57 (*) 70 - 99 mg/dL   BUN 19  6 - 23 mg/dL   Creatinine, Ser 6.96  0.50 - 1.10 mg/dL   Calcium 9.4  8.4 - 29.5 mg/dL   GFR calc non Af Amer 86 (*) >90 mL/min   GFR calc Af Amer >90  >90 mL/min  BASIC METABOLIC PANEL      Component Value Range   Sodium 135  135 - 145 mEq/L   Potassium 4.2  3.5 - 5.1 mEq/L   Chloride 98  96 - 112 mEq/L   CO2 33 (*) 19 - 32 mEq/L   Glucose, Bld 101 (*) 70 - 99 mg/dL   BUN 14  6 - 23 mg/dL   Creatinine, Ser 2.84  0.50 - 1.10 mg/dL   Calcium 9.1  8.4 - 13.2 mg/dL   GFR calc non Af Amer >90  >90 mL/min   GFR calc Af Amer >90  >90 mL/min  GLUCOSE, CAPILLARY      Component Value Range   Glucose-Capillary 121 (*) 70 - 99 mg/dL   Comment 1 Notify RN    GLUCOSE, CAPILLARY      Component Value Range   Glucose-Capillary 104 (*) 70 - 99 mg/dL   Comment 1 Notify RN    GLUCOSE, CAPILLARY      Component Value Range   Glucose-Capillary 92  70 - 99 mg/dL  BASIC METABOLIC PANEL      Component Value Range   Sodium 134 (*) 135 - 145 mEq/L   Potassium 3.5  3.5 - 5.1 mEq/L   Chloride 97  96 - 112 mEq/L   CO2 31  19 - 32 mEq/L   Glucose, Bld 115 (*) 70 - 99 mg/dL   BUN 11  6 - 23 mg/dL   Creatinine, Ser 4.40  0.50 - 1.10 mg/dL   Calcium 9.0  8.4 - 10.2 mg/dL   GFR calc non Af Amer >90  >90 mL/min   GFR calc Af Amer >90  >90 mL/min  CBC      Component Value Range   WBC 9.9  4.0 - 10.5 K/uL   RBC 3.17 (*) 3.87 - 5.11  MIL/uL   Hemoglobin 10.1 (*) 12.0 - 15.0 g/dL   HCT 72.5 (*) 36.6 - 44.0 %   MCV 95.6  78.0 - 100.0 fL   MCH 31.9  26.0 - 34.0 pg   MCHC 33.3  30.0 - 36.0 g/dL   RDW 34.7  42.5 - 95.6 %   Platelets 216  150 - 400 K/uL  PHOSPHORUS      Component Value Range   Phosphorus 3.2  2.3 - 4.6 mg/dL  BASIC METABOLIC PANEL      Component Value Range   Sodium 137  135 - 145 mEq/L   Potassium 3.6  3.5 - 5.1 mEq/L   Chloride 99  96 - 112 mEq/L   CO2 33 (*) 19 - 32 mEq/L   Glucose, Bld 101 (*) 70 - 99 mg/dL   BUN 8  6 - 23 mg/dL   Creatinine, Ser 3.87 (*) 0.50 - 1.10 mg/dL   Calcium 9.1  8.4 - 56.4 mg/dL   GFR calc  non Af Amer >90  >90 mL/min   GFR calc Af Amer >90  >90 mL/min  MAGNESIUM      Component Value Range   Magnesium 1.8  1.5 - 2.5 mg/dL  SURGICAL PCR SCREEN      Component Value Range   MRSA, PCR NEGATIVE  NEGATIVE   Staphylococcus aureus NEGATIVE  NEGATIVE  TYPE AND SCREEN      Component Value Range   ABO/RH(D) O NEG     Antibody Screen NEG     Sample Expiration 11/10/2012    ABO/RH      Component Value Range   ABO/RH(D) O NEG    CBC      Component Value Range   WBC 21.1 (*) 4.0 - 10.5 K/uL   RBC 3.50 (*) 3.87 - 5.11 MIL/uL   Hemoglobin 11.1 (*) 12.0 - 15.0 g/dL   HCT 16.1 (*) 09.6 - 04.5 %   MCV 95.1  78.0 - 100.0 fL   MCH 31.7  26.0 - 34.0 pg   MCHC 33.3  30.0 - 36.0 g/dL   RDW 40.9  81.1 - 91.4 %   Platelets 269  150 - 400 K/uL  BASIC METABOLIC PANEL      Component Value Range   Sodium 131 (*) 135 - 145 mEq/L   Potassium 4.4  3.5 - 5.1 mEq/L   Chloride 99  96 - 112 mEq/L   CO2 26  19 - 32 mEq/L   Glucose, Bld 238 (*) 70 - 99 mg/dL   BUN 12  6 - 23 mg/dL   Creatinine, Ser 7.82  0.50 - 1.10 mg/dL   Calcium 8.0 (*) 8.4 - 10.5 mg/dL   GFR calc non Af Amer 86 (*) >90 mL/min   GFR calc Af Amer >90  >90 mL/min  BLOOD GAS, ARTERIAL      Component Value Range   FIO2 0.30     Delivery systems VENTILATOR     Mode PRESSURE REGULATED VOLUME CONTROL     VT 0.450      Rate 14.0     Peep/cpap 5.0     pH, Arterial 7.398  7.350 - 7.450   pCO2 arterial 42.2  35.0 - 45.0 mmHg   pO2, Arterial 114.0 (*) 80.0 - 100.0 mmHg   Bicarbonate 25.5 (*) 20.0 - 24.0 mEq/L   TCO2 23.3  0 - 100 mmol/L   Acid-Base Excess 1.0  0.0 - 2.0 mmol/L   O2 Saturation 99.1     Patient temperature 98.6     Collection site ARTERIAL LINE     Drawn by [id number]     Sample type ARTERIAL DRAW    LACTIC ACID, PLASMA      Component Value Range   Lactic Acid, Venous 1.6  0.5 - 2.2 mmol/L  URINE CULTURE      Component Value Range   Specimen Description URINE, CATHETERIZED     Special Requests NONE     Culture  Setup Time 11/08/2012 22:04     Colony Count NO GROWTH     Culture NO GROWTH     Report Status 11/09/2012 FINAL    PROCALCITONIN      Component Value Range   Procalcitonin 2.86    GLUCOSE, CAPILLARY      Component Value Range   Glucose-Capillary 167 (*) 70 - 99 mg/dL  CBC      Component Value Range   WBC 17.3 (*) 4.0 - 10.5 K/uL   RBC 2.85 (*) 3.87 -  5.11 MIL/uL   Hemoglobin 8.9 (*) 12.0 - 15.0 g/dL   HCT 84.6 (*) 96.2 - 95.2 %   MCV 95.4  78.0 - 100.0 fL   MCH 31.2  26.0 - 34.0 pg   MCHC 32.7  30.0 - 36.0 g/dL   RDW 84.1  32.4 - 40.1 %   Platelets 223  150 - 400 K/uL  BASIC METABOLIC PANEL      Component Value Range   Sodium 134 (*) 135 - 145 mEq/L   Potassium 3.9  3.5 - 5.1 mEq/L   Chloride 102  96 - 112 mEq/L   CO2 28  19 - 32 mEq/L   Glucose, Bld 130 (*) 70 - 99 mg/dL   BUN 7  6 - 23 mg/dL   Creatinine, Ser 0.27  0.50 - 1.10 mg/dL   Calcium 8.0 (*) 8.4 - 10.5 mg/dL   GFR calc non Af Amer 88 (*) >90 mL/min   GFR calc Af Amer >90  >90 mL/min  MAGNESIUM      Component Value Range   Magnesium 1.3 (*) 1.5 - 2.5 mg/dL  PHOSPHORUS      Component Value Range   Phosphorus 2.0 (*) 2.3 - 4.6 mg/dL  PROCALCITONIN      Component Value Range   Procalcitonin 1.40    GLUCOSE, CAPILLARY      Component Value Range   Glucose-Capillary 155 (*) 70 - 99 mg/dL  GLUCOSE,  CAPILLARY      Component Value Range   Glucose-Capillary 132 (*) 70 - 99 mg/dL  GLUCOSE, CAPILLARY      Component Value Range   Glucose-Capillary 119 (*) 70 - 99 mg/dL  GLUCOSE, CAPILLARY      Component Value Range   Glucose-Capillary 101 (*) 70 - 99 mg/dL  GLUCOSE, CAPILLARY      Component Value Range   Glucose-Capillary 140 (*) 70 - 99 mg/dL  GLUCOSE, CAPILLARY      Component Value Range   Glucose-Capillary 151 (*) 70 - 99 mg/dL  PROCALCITONIN      Component Value Range   Procalcitonin 0.54    BASIC METABOLIC PANEL      Component Value Range   Sodium 136  135 - 145 mEq/L   Potassium 3.9  3.5 - 5.1 mEq/L   Chloride 103  96 - 112 mEq/L   CO2 27  19 - 32 mEq/L   Glucose, Bld 113 (*) 70 - 99 mg/dL   BUN 6  6 - 23 mg/dL   Creatinine, Ser 2.53  0.50 - 1.10 mg/dL   Calcium 8.3 (*) 8.4 - 10.5 mg/dL   GFR calc non Af Amer >90  >90 mL/min   GFR calc Af Amer >90  >90 mL/min  CBC      Component Value Range   WBC 14.8 (*) 4.0 - 10.5 K/uL   RBC 2.85 (*) 3.87 - 5.11 MIL/uL   Hemoglobin 9.1 (*) 12.0 - 15.0 g/dL   HCT 66.4 (*) 40.3 - 47.4 %   MCV 94.7  78.0 - 100.0 fL   MCH 31.9  26.0 - 34.0 pg   MCHC 33.7  30.0 - 36.0 g/dL   RDW 25.9  56.3 - 87.5 %   Platelets 264  150 - 400 K/uL  GLUCOSE, CAPILLARY      Component Value Range   Glucose-Capillary 127 (*) 70 - 99 mg/dL   Comment 1 Documented in Chart     Comment 2 Notify RN    GLUCOSE,  CAPILLARY      Component Value Range   Glucose-Capillary 131 (*) 70 - 99 mg/dL   Comment 1 Documented in Chart     Comment 2 Notify RN    GLUCOSE, CAPILLARY      Component Value Range   Glucose-Capillary 95  70 - 99 mg/dL   Comment 1 Documented in Chart     Comment 2 Notify RN    GLUCOSE, CAPILLARY      Component Value Range   Glucose-Capillary 105 (*) 70 - 99 mg/dL   Comment 1 Documented in Chart     Comment 2 Notify RN    GLUCOSE, CAPILLARY      Component Value Range   Glucose-Capillary 123 (*) 70 - 99 mg/dL   Comment 1 Documented in  Chart     Comment 2 Notify RN    GLUCOSE, CAPILLARY      Component Value Range   Glucose-Capillary 110 (*) 70 - 99 mg/dL   Comment 1 Documented in Chart     Comment 2 Notify RN    BASIC METABOLIC PANEL      Component Value Range   Sodium 136  135 - 145 mEq/L   Potassium 3.6  3.5 - 5.1 mEq/L   Chloride 101  96 - 112 mEq/L   CO2 30  19 - 32 mEq/L   Glucose, Bld 166 (*) 70 - 99 mg/dL   BUN 4 (*) 6 - 23 mg/dL   Creatinine, Ser 1.61  0.50 - 1.10 mg/dL   Calcium 8.7  8.4 - 09.6 mg/dL   GFR calc non Af Amer >90  >90 mL/min   GFR calc Af Amer >90  >90 mL/min  MAGNESIUM      Component Value Range   Magnesium 1.6  1.5 - 2.5 mg/dL  PHOSPHORUS      Component Value Range   Phosphorus 2.1 (*) 2.3 - 4.6 mg/dL  COMPREHENSIVE METABOLIC PANEL      Component Value Range   Sodium 137  135 - 145 mEq/L   Potassium 3.3 (*) 3.5 - 5.1 mEq/L   Chloride 99  96 - 112 mEq/L   CO2 34 (*) 19 - 32 mEq/L   Glucose, Bld 226 (*) 70 - 99 mg/dL   BUN 5 (*) 6 - 23 mg/dL   Creatinine, Ser 0.45  0.50 - 1.10 mg/dL   Calcium 8.5  8.4 - 40.9 mg/dL   Total Protein 4.5 (*) 6.0 - 8.3 g/dL   Albumin 1.7 (*) 3.5 - 5.2 g/dL   AST 12  0 - 37 U/L   ALT 19  0 - 35 U/L   Alkaline Phosphatase 52  39 - 117 U/L   Total Bilirubin 0.3  0.3 - 1.2 mg/dL   GFR calc non Af Amer >90  >90 mL/min   GFR calc Af Amer >90  >90 mL/min  PREALBUMIN      Component Value Range   Prealbumin 6.9 (*) 17.0 - 34.0 mg/dL  MAGNESIUM      Component Value Range   Magnesium 1.6  1.5 - 2.5 mg/dL  PHOSPHORUS      Component Value Range   Phosphorus 3.0  2.3 - 4.6 mg/dL  CHOLESTEROL, TOTAL      Component Value Range   Cholesterol 108  0 - 200 mg/dL  TRIGLYCERIDES      Component Value Range   Triglycerides 129  <150 mg/dL  CBC      Component Value Range   WBC  9.3  4.0 - 10.5 K/uL   RBC 2.55 (*) 3.87 - 5.11 MIL/uL   Hemoglobin 8.2 (*) 12.0 - 15.0 g/dL   HCT 16.1 (*) 09.6 - 04.5 %   MCV 94.5  78.0 - 100.0 fL   MCH 32.2  26.0 - 34.0 pg    MCHC 34.0  30.0 - 36.0 g/dL   RDW 40.9  81.1 - 91.4 %   Platelets 284  150 - 400 K/uL  DIFFERENTIAL      Component Value Range   Neutrophils Relative 76  43 - 77 %   Neutro Abs 7.1  1.7 - 7.7 K/uL   Lymphocytes Relative 11 (*) 12 - 46 %   Lymphs Abs 1.0  0.7 - 4.0 K/uL   Monocytes Relative 11  3 - 12 %   Monocytes Absolute 1.0  0.1 - 1.0 K/uL   Eosinophils Relative 2  0 - 5 %   Eosinophils Absolute 0.2  0.0 - 0.7 K/uL   Basophils Relative 0  0 - 1 %   Basophils Absolute 0.0  0.0 - 0.1 K/uL  GLUCOSE, CAPILLARY      Component Value Range   Glucose-Capillary 190 (*) 70 - 99 mg/dL   Comment 1 Documented in Chart     Comment 2 Notify RN    BASIC METABOLIC PANEL      Component Value Range   Sodium 137  135 - 145 mEq/L   Potassium 4.0  3.5 - 5.1 mEq/L   Chloride 98  96 - 112 mEq/L   CO2 36 (*) 19 - 32 mEq/L   Glucose, Bld 146 (*) 70 - 99 mg/dL   BUN 10  6 - 23 mg/dL   Creatinine, Ser 7.82  0.50 - 1.10 mg/dL   Calcium 9.1  8.4 - 95.6 mg/dL   GFR calc non Af Amer >90  >90 mL/min   GFR calc Af Amer >90  >90 mL/min  GLUCOSE, CAPILLARY      Component Value Range   Glucose-Capillary 184 (*) 70 - 99 mg/dL   Comment 1 Notify RN    GLUCOSE, CAPILLARY      Component Value Range   Glucose-Capillary 152 (*) 70 - 99 mg/dL   Comment 1 Notify RN     Comment 2 Documented in Chart    COMPREHENSIVE METABOLIC PANEL      Component Value Range   Sodium 136  135 - 145 mEq/L   Potassium 4.1  3.5 - 5.1 mEq/L   Chloride 96  96 - 112 mEq/L   CO2 37 (*) 19 - 32 mEq/L   Glucose, Bld 194 (*) 70 - 99 mg/dL   BUN 14  6 - 23 mg/dL   Creatinine, Ser 2.13  0.50 - 1.10 mg/dL   Calcium 9.2  8.4 - 08.6 mg/dL   Total Protein 5.0 (*) 6.0 - 8.3 g/dL   Albumin 2.0 (*) 3.5 - 5.2 g/dL   AST 15  0 - 37 U/L   ALT 19  0 - 35 U/L   Alkaline Phosphatase 71  39 - 117 U/L   Total Bilirubin 0.3  0.3 - 1.2 mg/dL   GFR calc non Af Amer >90  >90 mL/min   GFR calc Af Amer >90  >90 mL/min  MAGNESIUM      Component  Value Range   Magnesium 1.7  1.5 - 2.5 mg/dL  PHOSPHORUS      Component Value Range   Phosphorus 2.9  2.3 - 4.6  mg/dL  GLUCOSE, CAPILLARY      Component Value Range   Glucose-Capillary 179 (*) 70 - 99 mg/dL  GLUCOSE, CAPILLARY      Component Value Range   Glucose-Capillary 178 (*) 70 - 99 mg/dL   Comment 1 Notify RN    GLUCOSE, CAPILLARY      Component Value Range   Glucose-Capillary 192 (*) 70 - 99 mg/dL   Comment 1 Notify RN    GLUCOSE, CAPILLARY      Component Value Range   Glucose-Capillary 159 (*) 70 - 99 mg/dL  GLUCOSE, CAPILLARY      Component Value Range   Glucose-Capillary 205 (*) 70 - 99 mg/dL  GLUCOSE, CAPILLARY      Component Value Range   Glucose-Capillary 195 (*) 70 - 99 mg/dL   Comment 1 Notify RN    GLUCOSE, CAPILLARY      Component Value Range   Glucose-Capillary 179 (*) 70 - 99 mg/dL  GLUCOSE, CAPILLARY      Component Value Range   Glucose-Capillary 176 (*) 70 - 99 mg/dL  BASIC METABOLIC PANEL      Component Value Range   Sodium 135  135 - 145 mEq/L   Potassium 3.9  3.5 - 5.1 mEq/L   Chloride 94 (*) 96 - 112 mEq/L   CO2 40 (*) 19 - 32 mEq/L   Glucose, Bld 173 (*) 70 - 99 mg/dL   BUN 15  6 - 23 mg/dL   Creatinine, Ser 9.14 (*) 0.50 - 1.10 mg/dL   Calcium 9.0  8.4 - 78.2 mg/dL   GFR calc non Af Amer >90  >90 mL/min   GFR calc Af Amer >90  >90 mL/min  CBC      Component Value Range   WBC 10.1  4.0 - 10.5 K/uL   RBC 2.62 (*) 3.87 - 5.11 MIL/uL   Hemoglobin 8.2 (*) 12.0 - 15.0 g/dL   HCT 95.6 (*) 21.3 - 08.6 %   MCV 98.5  78.0 - 100.0 fL   MCH 31.3  26.0 - 34.0 pg   MCHC 31.8  30.0 - 36.0 g/dL   RDW 57.8  46.9 - 62.9 %   Platelets 394  150 - 400 K/uL  GLUCOSE, CAPILLARY      Component Value Range   Glucose-Capillary 83  70 - 99 mg/dL  GLUCOSE, CAPILLARY      Component Value Range   Glucose-Capillary 150 (*) 70 - 99 mg/dL   Comment 1 Notify RN    GLUCOSE, CAPILLARY      Component Value Range   Glucose-Capillary 188 (*) 70 - 99 mg/dL    GLUCOSE, CAPILLARY      Component Value Range   Glucose-Capillary 160 (*) 70 - 99 mg/dL   Comment 1 Documented in Chart     Comment 2 Notify RN    GLUCOSE, CAPILLARY      Component Value Range   Glucose-Capillary 127 (*) 70 - 99 mg/dL   Comment 1 Documented in Chart     Comment 2 Notify RN    GLUCOSE, CAPILLARY      Component Value Range   Glucose-Capillary 138 (*) 70 - 99 mg/dL  GLUCOSE, CAPILLARY      Component Value Range   Glucose-Capillary 155 (*) 70 - 99 mg/dL  GLUCOSE, CAPILLARY      Component Value Range   Glucose-Capillary 158 (*) 70 - 99 mg/dL   Comment 1 Documented in Chart     Comment 2  Notify RN    COMPREHENSIVE METABOLIC PANEL      Component Value Range   Sodium 135  135 - 145 mEq/L   Potassium 4.3  3.5 - 5.1 mEq/L   Chloride 93 (*) 96 - 112 mEq/L   CO2 39 (*) 19 - 32 mEq/L   Glucose, Bld 132 (*) 70 - 99 mg/dL   BUN 16  6 - 23 mg/dL   Creatinine, Ser 4.09 (*) 0.50 - 1.10 mg/dL   Calcium 9.2  8.4 - 81.1 mg/dL   Total Protein 4.9 (*) 6.0 - 8.3 g/dL   Albumin 1.9 (*) 3.5 - 5.2 g/dL   AST 36  0 - 37 U/L   ALT 30  0 - 35 U/L   Alkaline Phosphatase 110  39 - 117 U/L   Total Bilirubin 0.4  0.3 - 1.2 mg/dL   GFR calc non Af Amer >90  >90 mL/min   GFR calc Af Amer >90  >90 mL/min  MAGNESIUM      Component Value Range   Magnesium 2.0  1.5 - 2.5 mg/dL  PHOSPHORUS      Component Value Range   Phosphorus 3.3  2.3 - 4.6 mg/dL  CBC      Component Value Range   WBC 12.1 (*) 4.0 - 10.5 K/uL   RBC 2.64 (*) 3.87 - 5.11 MIL/uL   Hemoglobin 8.2 (*) 12.0 - 15.0 g/dL   HCT 91.4 (*) 78.2 - 95.6 %   MCV 99.6  78.0 - 100.0 fL   MCH 31.1  26.0 - 34.0 pg   MCHC 31.2  30.0 - 36.0 g/dL   RDW 21.3  08.6 - 57.8 %   Platelets 440 (*) 150 - 400 K/uL  DIFFERENTIAL      Component Value Range   Neutrophils Relative 74  43 - 77 %   Neutro Abs 9.0 (*) 1.7 - 7.7 K/uL   Lymphocytes Relative 15  12 - 46 %   Lymphs Abs 1.8  0.7 - 4.0 K/uL   Monocytes Relative 9  3 - 12 %    Monocytes Absolute 1.0  0.1 - 1.0 K/uL   Eosinophils Relative 2  0 - 5 %   Eosinophils Absolute 0.3  0.0 - 0.7 K/uL   Basophils Relative 0  0 - 1 %   Basophils Absolute 0.0  0.0 - 0.1 K/uL  CHOLESTEROL, TOTAL      Component Value Range   Cholesterol 108  0 - 200 mg/dL  TRIGLYCERIDES      Component Value Range   Triglycerides 105  <150 mg/dL  PREALBUMIN      Component Value Range   Prealbumin 10.4 (*) 17.0 - 34.0 mg/dL  GLUCOSE, CAPILLARY      Component Value Range   Glucose-Capillary 117 (*) 70 - 99 mg/dL   Comment 1 Documented in Chart     Comment 2 Notify RN    GLUCOSE, CAPILLARY      Component Value Range   Glucose-Capillary 125 (*) 70 - 99 mg/dL  GLUCOSE, CAPILLARY      Component Value Range   Glucose-Capillary 163 (*) 70 - 99 mg/dL  GLUCOSE, CAPILLARY      Component Value Range   Glucose-Capillary 141 (*) 70 - 99 mg/dL   Comment 1 Documented in Chart     Comment 2 Notify RN    BASIC METABOLIC PANEL      Component Value Range   Sodium 135  135 - 145 mEq/L  Potassium 4.6  3.5 - 5.1 mEq/L   Chloride 93 (*) 96 - 112 mEq/L   CO2 39 (*) 19 - 32 mEq/L   Glucose, Bld 170 (*) 70 - 99 mg/dL   BUN 20  6 - 23 mg/dL   Creatinine, Ser 1.61 (*) 0.50 - 1.10 mg/dL   Calcium 9.6  8.4 - 09.6 mg/dL   GFR calc non Af Amer >90  >90 mL/min   GFR calc Af Amer >90  >90 mL/min  GLUCOSE, CAPILLARY      Component Value Range   Glucose-Capillary 124 (*) 70 - 99 mg/dL  GLUCOSE, CAPILLARY      Component Value Range   Glucose-Capillary 168 (*) 70 - 99 mg/dL   Comment 1 Notify RN    GLUCOSE, CAPILLARY      Component Value Range   Glucose-Capillary 171 (*) 70 - 99 mg/dL  GLUCOSE, CAPILLARY      Component Value Range   Glucose-Capillary 161 (*) 70 - 99 mg/dL  COMPREHENSIVE METABOLIC PANEL      Component Value Range   Sodium 136  135 - 145 mEq/L   Potassium 4.5  3.5 - 5.1 mEq/L   Chloride 95 (*) 96 - 112 mEq/L   CO2 38 (*) 19 - 32 mEq/L   Glucose, Bld 135 (*) 70 - 99 mg/dL   BUN 20  6  - 23 mg/dL   Creatinine, Ser 0.45 (*) 0.50 - 1.10 mg/dL   Calcium 9.5  8.4 - 40.9 mg/dL   Total Protein 5.1 (*) 6.0 - 8.3 g/dL   Albumin 1.9 (*) 3.5 - 5.2 g/dL   AST 23  0 - 37 U/L   ALT 22  0 - 35 U/L   Alkaline Phosphatase 136 (*) 39 - 117 U/L   Total Bilirubin 0.3  0.3 - 1.2 mg/dL   GFR calc non Af Amer >90  >90 mL/min   GFR calc Af Amer >90  >90 mL/min  MAGNESIUM      Component Value Range   Magnesium 1.9  1.5 - 2.5 mg/dL  PHOSPHORUS      Component Value Range   Phosphorus 2.9  2.3 - 4.6 mg/dL  GLUCOSE, CAPILLARY      Component Value Range   Glucose-Capillary 132 (*) 70 - 99 mg/dL  GLUCOSE, CAPILLARY      Component Value Range   Glucose-Capillary 145 (*) 70 - 99 mg/dL   Comment 1 Notify RN    GLUCOSE, CAPILLARY      Component Value Range   Glucose-Capillary 160 (*) 70 - 99 mg/dL   Comment 1 Notify RN     A/P: Pt with hx expl lap/LOA for SBO 11/07/12; now with pelvic cul de sac fluid collection. Plan is for CT guided aspiration/possible drainage of collection today. Details/risks of procedure d/w pt with her understanding and consent.

## 2012-11-20 NOTE — ED Notes (Signed)
Pt log rolled from CT table to bed.  When pt assessed post transfer, there were 2 skin tears noted to L arm.  One in hand and the other on her distal forearm.  Both sites had small amount of bleeding noted.  Pressure was applied for apporx 2 mins.  Gauze dressing applied.  Site documented on doc flow sheet.

## 2012-11-20 NOTE — Progress Notes (Signed)
Drain placed - IR help appreciated.  -more BMs a hopeful sign -try PO liquids w NGT clamped -TNA for severe malnutrition & prolonged ileus  -VTE prophylaxis- SCDs, etc  -mobilize as tolerated to help recovery -severe COPD - stable. Reoperation very risky...but persistent ileus/SBO not good long-term   Ardeth Sportsman, M.D., F.A.C.S. Gastrointestinal and Minimally Invasive Surgery [facility] [address] [place], [place] [address] [phone] Main / Paging [phone] Voice Mail

## 2012-11-20 NOTE — Procedures (Signed)
Technically successful CT guided drainage catheter placement into pelvis via left transgluteal approach yielding 10 cc of bloody fluid.  Samples sent to laboratory.  No immediate post procedural complications.

## 2012-11-20 NOTE — Progress Notes (Signed)
Pt profile: 72 y/o wf admitted by [facility] 12/24 with dx of AECOP and treated accordingly with improvement in respiratory symptoms. She developed progressive abdominal distention with KUB and CT abd c/w SBO. Underwent exploratory laparotomy, lysis of adhesions 1/02. Returned to ICU on vent. PCCM asked to assist with vent/ICU mgmt   Lines, Tubes, etc: ETT 1/02 >> 1/03 R IJ CVL 1/02 >> out  Microbiology: MRSA PCR 1/02 >> NEG 1/3 BC x 2 >>not collected 1/3 UC >> NEG  Antibiotics:  vanco (empiric, fever) 1/3 >> 1/04 Zosyn (empiric, fever) 1/3 >> 1/10    Studies/Events: 12/31 CT abd: Small bowel obstruction with dilated small bowel to the level of the distal ileum in the region of the pelvis where there is abrupt change of caliber which may be related to adhesions 1/2 Hypotension post op >pressor support  1/3 low grade fever/elevated WBC >pan cx  1/9 - ileus continues, n/v with NGT clamped, stable but tenuous respiratory status   Consults:  [facility] 12/30  Best Practice: DVT: SQ hep SUP: PPI Nutrition: none Glycemic control: SSI Sedation/analgesia: PRN fentanyl   [name]: no change overnigh.  Still requiring NG.  No increased wob.  Now off abx.  Objective:  Filed Vitals:   11/20/12 1243 11/20/12 1249 11/20/12 1254 11/20/12 1300  BP: 166/70 168/71 158/68 160/70  Pulse: 116 114 113 111  Temp:      TempSrc:      Resp: 17 18 15 16   Height:      Weight:      SpO2: 97% 97% 97% 98%   Gen: wd female, nad.   HEENT: NGT in place, op clear Neck: no JVD or LN Chest: decreased bs, no wheezing, a few basilar crackles. Cardiac: RRR, no MRG Abd: hypoactive, surgical incision midline c/d/i, staples intact Ext:no edema or cyanosis Neuro: a/o x 3 , moves all 4.   BMET  Lab 11/20/12 0520 11/19/12 0610 11/18/12 0510 11/16/12 0510 11/14/12 0350  NA 136 135 135 135 136  K 4.5 4.6 -- -- --  CL 95* 93* 93* 94* 96  CO2 38* 39* 39* 40* 37*  GLUCOSE 135* 170* 132* 173* 194*  BUN 20 20 16 15 14     CREATININE 0.41* 0.38* 0.42* 0.44* 0.50  CALCIUM 9.5 9.6 9.2 9.0 9.2  MG 1.9 -- 2.0 -- 1.7  PHOS 2.9 -- 3.3 -- 2.9   CBC  Lab 11/20/12 1105 11/18/12 0510 11/16/12 0510  HGB 7.9* 8.2* 8.2*  HCT 25.1* 26.3* 25.8*  WBC 10.6* 12.1* 10.1  PLT 455* 440* 394      IMPRESSION:  SBO (small bowel obstruction) - Status post laparotomy per [facility].  NGT remains to suction.  Nausea 1/9 with clamping.  Plan: -rec's per Surgery- 1-15 to or for clean out -TPN per pharmacy -trend BMP prn   Post-operative respiratory failure - Resolved.  Emphysema, severe - No acute bronchospasm or increased wob at rest.   Plan: -aggressive pulmonary hygiene IS/Flutter -pulmicort, BD's    Anemia - in setting of chronic disease / acute critical illness.  No indication for transfusion.   Basename 11/20/12 1105 11/18/12 0510  HGB 7.9* 8.2*    Plan: -trend CBC  Brett Canales Minor ACNP Adolph Pollack PCCM Pager [phone] till 3 pm If no answer page [phone] 11/20/2012, 1:32 PM

## 2012-11-20 NOTE — Progress Notes (Signed)
13 Days Post-Op  Subjective: No nausea with tube clamped, still a little distended, and not walking a great deal.  Some flatus and loose BM.  Objective: Vital signs in last 24 hours: Temp:  [98.2 F (36.8 C)-98.4 F (36.9 C)] 98.2 F (36.8 C) (01/15 0545) Pulse Rate:  [90-114] 114  (01/15 0545) Resp:  [16-20] 20  (01/15 0545) BP: (93-125)/(47-77) 125/77 mmHg (01/15 0545) SpO2:  [97 %-100 %] 99 % (01/15 0745) Last BM Date: 11/17/12  2 stools yesterday, NG clamped,  NPO,  TNA, afebrile, VSS, labs OK her CO2 is chronically high. CT shows resolution of SBO, 2 x 6 cm fluid collection in cul de sac, and we plan to have IR drain that today if possible. Right pleural effusion is better. Intake/Output from previous day: 01/14 0701 - 01/15 0700 In: 0  Out: 1050 [Urine:950; Emesis/NG output:100] Intake/Output this shift:    General appearance: alert, cooperative and no distress GI: soft, still distended, few BS, +flatus, some not much, and some stool.  Lab Results:   [facility] 11/18/12 0510  WBC 12.1*  HGB 8.2*  HCT 26.3*  PLT 440*    BMET  Basename 11/20/12 0520 11/19/12 0610  NA 136 135  K 4.5 4.6  CL 95* 93*  CO2 38* 39*  GLUCOSE 135* 170*  BUN 20 20  CREATININE 0.41* 0.38*  CALCIUM 9.5 9.6   PT/INR No results found for this basename: LABPROT:2,INR:2 in the last 72 hours   Lab 11/20/12 0520 11/18/12 0510 11/14/12 0350  AST 23 36 15  ALT 22 30 19   ALKPHOS 136* 110 71  BILITOT 0.3 0.4 0.3  PROT 5.1* 4.9* 5.0*  ALBUMIN 1.9* 1.9* 2.0*     Lipase  No results found for this basename: lipase     Studies/Results: Ct Abdomen Pelvis W Contrast  11/19/2012  *RADIOLOGY REPORT*  Clinical Data: Postop from lysis of adhesions for small bowel obstruction.  Worsening abdominal pain and bloating.  Personal history of ovarian carcinoma.  CT ABDOMEN AND PELVIS WITH CONTRAST  Technique:  Multidetector CT imaging of the abdomen and pelvis was performed following the standard  protocol during bolus administration of intravenous contrast.  Contrast: OMNIPAQUE IOHEXOL 300 MG/ML  SOLN  Comparison: 11/05/2012  Findings: A small right pleural effusion persists but is decreased in size since previous study.  There has been resolution of small bowel obstruction since previous study.  Nasogastric tube is seen within the stomach. A small rim enhancing fluid collection persists in the cul-de-sac which measures 2 x 6 cm.  This could represent free fluid although a infected postop collection cannot definitely be excluded.  Diffuse body wall edema is noted but no other abnormal fluid collections are seen.  Tiny sub-centimeter cysts again seen in the left hepatic lobe.  No liver masses are identified.  The gallbladder, pancreas, spleen, adrenal glands, and kidneys are normal in appearance.  No evidence of hydronephrosis.  No soft tissue mass or lymphadenopathy identified.  Previous hysterectomy again noted.  IMPRESSION:  1.  Resolution of small bowel obstruction since previous study. 2.  2 x 6 cm persistent fluid collection in pelvic cul-de-sac, which could represent free fluid although a small postop abscess cannot definitely be excluded. 3.  Decreased size of small right pleural effusion.   Original Report Authenticated By: Myles Rosenthal, M.D.    Dg Abd Acute W/chest  11/18/2012  *RADIOLOGY REPORT*  Clinical Data: Abdominal pain  ACUTE ABDOMEN SERIES (ABDOMEN 2 VIEW & CHEST  1 VIEW)  Comparison: Most recent prior abdominal radiographs 11/14/2012  Findings: A nasogastric tube is present, the tip is just within the stomach and the proximal side port in the region of the gastroesophageal junction.  Unchanged position right upper extremity PICC.  The tip projects over the mid superior vena cava. Improving gaseous distension of the colon and small bowel.  No free air identified on the lateral decubitus view.  There are several small air fluid levels.  Midline abdominal staples suggest recent midline  incision.  The patient is rotated to the left exaggerate the contours of the right hilum.  Chronic underlying lung changes appears similar to prior.  IMPRESSION:  1.  Improving gaseous distension of small and large bowel compared to 11/14/2012.  No evidence of free air on the lateral decubitus view. 2.  The nasogastric tube has pulled back, the tip is now just within the gastric fundus and the proximal side port in the GE junction.  Consider advancing several centimeters for more optimal placement.   Original Report Authenticated By: Malachy Moan, M.D.     Medications:    . albuterol  2.5 mg Nebulization Q6H  . antiseptic oral rinse  15 mL Mouth Rinse QID  . bisacodyl  10 mg Rectal Daily  . budesonide  0.25 mg Nebulization QID  . chlorhexidine  15 mL Mouth Rinse BID  . heparin  5,000 Units Subcutaneous Q8H  . insulin aspart  0-9 Units Subcutaneous Q6H  . insulin glargine  10 Units Subcutaneous Daily  . lip balm  1 application Topical BID  . pantoprazole (PROTONIX) IV  40 mg Intravenous Daily  . sodium chloride  10-40 mL Intracatheter Q12H  . 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  Hypertension  Hyperlipidemia Hx of ovarian ca with abdominal hysterectomy  Post op anemia  PCM prealbumin is 6.9,  10.4 (11/17/12) on TNA CT scan 11/19/12 shows 2 x 6 cn fkyud collection in the cul de sac thath could represent abscess,  Plan to get percutaneous drain today.  Plan:  Hope to ger perc drain today, try her on some clears with tube in place and hope for progression.  She will have TNA labs in AM    LOS: 22 days    Ahron Hulbert 11/20/2012

## 2012-11-20 NOTE — Progress Notes (Signed)
PARENTERAL NUTRITION CONSULT NOTE - Follow-up  Pharmacy Consult for TNA Indication: SBO  Allergies  Allergen Reactions  . Oxycodone-Acetaminophen     REACTION: headaches,nausea   Patient Measurements: Height: 5\' 5"  (165.1 cm) Weight: 114 lb 6.7 oz (51.9 kg) IBW/kg (Calculated) : 57  Usual Weight: 43 kg  Vital Signs: Temp: 98.2 F (36.8 C) (01/15 0545) Temp src: Oral (01/15 0545) BP: 125/77 mmHg (01/15 0545) Pulse Rate: 114  (01/15 0545) Intake/Output from previous day: 01/14 0701 - 01/15 0700 In: 0  Out: 1050 [Urine:950; Emesis/NG output:100] Intake/Output from this shift:    Labs:  [facility] 11/18/12 0510  WBC 12.1*  HGB 8.2*  HCT 26.3*  PLT 440*  APTT --  INR --    Basename 11/20/12 0520 11/19/12 0610 11/18/12 0510  NA 136 135 135  K 4.5 4.6 4.3  CL 95* 93* 93*  CO2 38* 39* 39*  GLUCOSE 135* 170* 132*  BUN 20 20 16   CREATININE 0.41* 0.38* 0.42*  LABCREA -- -- --  CREAT24HRUR -- -- --  CALCIUM 9.5 9.6 9.2  MG 1.9 -- 2.0  PHOS 2.9 -- 3.3  PROT 5.1* -- 4.9*  ALBUMIN 1.9* -- 1.9*  AST 23 -- 36  ALT 22 -- 30  ALKPHOS 136* -- 110  BILITOT 0.3 -- 0.4  BILIDIR -- -- --  IBILI -- -- --  PREALBUMIN -- -- 10.4*  TRIG -- -- 105  CHOLHDL -- -- --  CHOL -- -- 108   Estimated Creatinine Clearance: 52.8 ml/min (by C-G formula based on Cr of 0.41).   Basename 11/20/12 0545 11/19/12 2350 11/19/12 1805  GLUCAP 160* 145* 132*   Medications:  Scheduled:     . albuterol  2.5 mg Nebulization Q6H  . antiseptic oral rinse  15 mL Mouth Rinse QID  . bisacodyl  10 mg Rectal Daily  . budesonide  0.25 mg Nebulization QID  . chlorhexidine  15 mL Mouth Rinse BID  . heparin  5,000 Units Subcutaneous Q8H  . insulin aspart  0-9 Units Subcutaneous Q6H  . insulin glargine  10 Units Subcutaneous Daily  . lip balm  1 application Topical BID  . pantoprazole (PROTONIX) IV  40 mg Intravenous Daily  . sodium chloride  10-40 mL Intracatheter Q12H  . tiotropium  18 mcg  Inhalation Daily   Infusions:     . dextrose 5 % and 0.45 % NaCl with KCl 20 mEq/L 30 mL/hr (11/20/12 0030)  . [EXPIRED] fat emulsion 250 mL (11/18/12 1720)  . [EXPIRED] TPN (CLINIMIX) +/- additives 70 mL/hr at 11/18/12 1719  . TPN (CLINIMIX) +/- additives 70 mL/hr at 11/19/12 1752   Insulin Requirements in the past 24 hours:  CBG 132-160 Sensitive SSI -  7 units in past 24 hours Lantus 10 units at bedtime  Current Nutrition:  NPO  TNA @ 70 ml/hr Lipids @ 10 ml/hr MWF ONLY Relgan 5 mg IV Q6h   Fluids: MIVF: D5 1/2 NS with 20 mEQ K+ @ 30 ml/hr  Assessment 71 yoF admit 12/24 with COPD exacerbation. She developed progressive abdominal distention with KUB and CT abd c/w SBO. Underwent exploratory laparotomy, lysis of adhesions 1/02.  TNA started 11/11/12.  She has had small BM's but abd remains distended, nausea with NG tube clamped or unclamped. Per 1/14 CT - resolution of SBO since last study   Electrolytes:  Stable except corrected Ca now 11.2 - continue to monitor  CBG's: elevated > goal 150 a few readings but stable -  continue current SSI and Lantus   Liver: LFT's remain WNL (1/15), AlkPhos elevated slightly  Renal: Scr remains wnl; CrCl 52 ml/min   Prealbumin: 6.9 (1/7), 10.4 (1/13)  TGs: 129 (1/7), 105 (1/13)  Nutritional Goals: per RD Kcal: 1500-1750 Kcal/day  Protein: 70-90g/day  Fluid: 1.5-1.7L  Clinimix E 5/20 at goal rate of 70 ml/hr would deliver 84 gm protein, avg 1680 kcal/day.  Plan:   Continue Clinimix E5/20 at goal rate 70 ml/hr  Lipids, MVI and trace elements MWF only due to national back order.  Continue CBG q6h, sensitive scale.  TNA lab panel Mon/Thurs  Await advancement of diet and possible weaning of TNA   Hessie Knows, PharmD, BCPS Pager [phone] 11/20/2012 8:07 AM

## 2012-11-21 ENCOUNTER — Encounter (HOSPITAL_COMMUNITY): Payer: Medicare Other

## 2012-11-21 LAB — COMPREHENSIVE METABOLIC PANEL
Albumin: 1.7 g/dL — ABNORMAL LOW (ref 3.5–5.2)
BUN: 23 mg/dL (ref 6–23)
Chloride: 92 mEq/L — ABNORMAL LOW (ref 96–112)
Creatinine, Ser: 0.4 mg/dL — ABNORMAL LOW (ref 0.50–1.10)
GFR calc Af Amer: >90 mL/min (ref >90)
Glucose, Bld: 160 mg/dL — ABNORMAL HIGH (ref 70–99)
Total Bilirubin: 0.2 mg/dL — ABNORMAL LOW (ref 0.3–1.2)
Total Protein: 4.8 g/dL — ABNORMAL LOW (ref 6.0–8.3)

## 2012-11-21 LAB — GLUCOSE, CAPILLARY
Glucose-Capillary: 160 mg/dL — ABNORMAL HIGH (ref 70–99)
Glucose-Capillary: 175 mg/dL — ABNORMAL HIGH (ref 70–99)

## 2012-11-21 LAB — PHOSPHORUS: Phosphorus: 2.6 mg/dL (ref 2.3–4.6)

## 2012-11-21 LAB — MAGNESIUM: Magnesium: 1.8 mg/dL (ref 1.5–2.5)

## 2012-11-21 MED ORDER — CLINIMIX E/DEXTROSE (5/20) 5 % IV SOLN
INTRAVENOUS | Status: AC
  Administered 2012-11-21: 17:00:00 via INTRAVENOUS
  Filled 2012-11-21: qty 2000

## 2012-11-21 MED ORDER — TRAMADOL HCL 50 MG PO TABS
100.0000 mg | ORAL_TABLET | Freq: Two times a day (BID) | ORAL | Status: DC | PRN
  Administered 2012-11-25 – 2012-11-27 (×3): 100 mg via ORAL
  Filled 2012-11-21 (×3): qty 2

## 2012-11-21 MED ORDER — LORAZEPAM 2 MG/ML IJ SOLN
0.2500 mg | Freq: Three times a day (TID) | INTRAMUSCULAR | Status: DC | PRN
  Administered 2012-11-22 – 2012-11-28 (×8): 0.25 mg via INTRAVENOUS
  Filled 2012-11-21 (×8): qty 1

## 2012-11-21 MED ORDER — FENTANYL CITRATE 0.05 MG/ML IJ SOLN
12.5000 ug | INTRAMUSCULAR | Status: DC | PRN
  Administered 2012-11-21 – 2012-11-29 (×58): 25 ug via INTRAVENOUS
  Filled 2012-11-21 (×58): qty 2

## 2012-11-21 MED ORDER — ACETAMINOPHEN 325 MG PO TABS: 650.0000 mg | ORAL_TABLET | Freq: Four times a day (QID) | ORAL | Status: DC | PRN

## 2012-11-21 NOTE — Progress Notes (Signed)
[facility]  Patient Status: New Admission for [facility]  Infusion Pharmacy- TNA  [facility] is providing the following services: [facility] team will partner with the nursing staff, and Reyne Dumas, RN, [employer], at [facility] to ensure safe smooth transition to SNF on TNA.  If patient discharges after hours, please call [phone].   Sedalia Muta 11/21/2012, 11:37 AM

## 2012-11-21 NOTE — Progress Notes (Addendum)
PARENTERAL NUTRITION CONSULT NOTE - Follow-up  Pharmacy Consult for TNA Indication: SBO  Allergies  Allergen Reactions  . Oxycodone-Acetaminophen     REACTION: headaches,nausea   Patient Measurements: Height: 5\' 5"  (165.1 cm) Weight: 114 lb 6.7 oz (51.9 kg) IBW/kg (Calculated) : 57  Usual Weight: 43 kg  Vital Signs: Temp: 98.3 F (36.8 C) (01/16 1046) Temp src: Oral (01/16 1046) BP: 139/54 mmHg (01/16 1046) Pulse Rate: 107  (01/16 1046) Intake/Output from previous day: 01/15 0701 - 01/16 0700 In: 10  Out: 315 [Urine:300; Drains:15] Intake/Output from this shift:    Labs:  [facility] 11/20/12 1105  WBC 10.6*  HGB 7.9*  HCT 25.1*  PLT 455*  APTT 46*  INR 0.89    Basename 11/21/12 0410 11/20/12 0520 11/19/12 0610  NA 132* 136 135  K 4.5 4.5 4.6  CL 92* 95* 93*  CO2 40* 38* 39*  GLUCOSE 160* 135* 170*  BUN 23 20 20   CREATININE 0.40* 0.41* 0.38*  LABCREA -- -- --  CREAT24HRUR -- -- --  CALCIUM 9.1 9.5 9.6  MG 1.8 1.9 --  PHOS 2.6 2.9 --  PROT 4.8* 5.1* --  ALBUMIN 1.7* 1.9* --  AST 17 23 --  ALT 17 22 --  ALKPHOS 126* 136* --  BILITOT 0.2* 0.3 --  BILIDIR -- -- --  IBILI -- -- --  PREALBUMIN -- -- --  TRIG -- -- --  CHOLHDL -- -- --  CHOL -- -- --   Estimated Creatinine Clearance: 52.8 ml/min (by C-G formula based on Cr of 0.4).   Basename 11/21/12 0608 11/20/12 2334 11/20/12 1717  GLUCAP 167* 173* 133*   Medications:  Scheduled:     . albuterol  2.5 mg Nebulization Q6H  . antiseptic oral rinse  15 mL Mouth Rinse QID  . bisacodyl  10 mg Rectal Daily  . budesonide  0.25 mg Nebulization QID  . chlorhexidine  15 mL Mouth Rinse BID  . heparin  5,000 Units Subcutaneous Q8H  . insulin aspart  0-9 Units Subcutaneous Q6H  . insulin glargine  10 Units Subcutaneous Daily  . lip balm  1 application Topical BID  . pantoprazole (PROTONIX) IV  40 mg Intravenous Daily  . sodium chloride  10-40 mL Intracatheter Q12H  . tiotropium  18 mcg Inhalation Daily    Infusions:     . dextrose 5 % and 0.45 % NaCl with KCl 20 mEq/L 30 mL/hr (11/20/12 0030)  . TPN (CLINIMIX) +/- additives 70 mL/hr at 11/20/12 1733   And  . fat emulsion 250 mL (11/20/12 1733)  . [EXPIRED] TPN (CLINIMIX) +/- additives 70 mL/hr at 11/19/12 1752   Insulin Requirements in the past 24 hours:  CBG 133-173 Sensitive SSI -  5 units in past 24 hours Lantus 10 units at bedtime  Current Nutrition:  NPO  TNA @ 70 ml/hr Lipids @ 10 ml/hr MWF ONLY Reglan 5 mg IV Q6h   Fluids: MIVF: D5 1/2 NS with 20 mEQ K+ @ 30 ml/hr  Assessment 71 yoF admit 12/24 with COPD exacerbation. She developed progressive abdominal distention with KUB and CT abd c/w SBO. Underwent exploratory laparotomy, lysis of adhesions 1/02.  TNA started 11/11/12.  She has had small BM's but abd remains distended, nausea with NG tube clamped or unclamped. Per 1/14 CT - resolution of SBO since last study Nausea resolving, NG clamped, possible removal today   Electrolytes:  Stable except corrected Ca now 10.6 - continue to monitor. Na 132; cannot  adjust in TNA  CBG's: elevated > goal 150 a few readings but stable - continue current SSI and Lantus   Liver: LFT's remain WNL (1/15), AlkPhos elevated slightly  Renal: Scr remains wnl; CrCl 52 ml/min   Prealbumin: 6.9 (1/7), 10.4 (1/13)  TGs: 129 (1/7), 105 (1/13)  Nutritional Goals: per RD Kcal: 1500-1750 Kcal/day  Protein: 70-90g/day  Fluid: 1.5-1.7L  Clinimix E 5/20 at goal rate of 70 ml/hr would deliver 84 gm protein, avg 1680 kcal/day.  Plan:   Continue Clinimix E5/20 at goal rate 70 ml/hr  Lipids, MVI and trace elements MWF only due to national back order.  Continue CBG q6h, sensitive scale.  TNA lab panel Mon/Thurs  Await advancement of diet and possible weaning of TNA, possible d/c NG tube today  BMET in am  Otho Bellows PharmD Pager [phone] 11/21/2012 11:59 AM

## 2012-11-21 NOTE — Progress Notes (Signed)
Subjective: Pt ok, not much pain. Anxious to get rid of NG  Objective: Physical Exam: BP 114/57  Pulse 96  Temp 98.5 F (36.9 C) (Oral)  Resp 20  Ht 5\' 5"  (1.651 m)  Wt 114 lb 6.7 oz (51.9 kg)  BMI 19.04 kg/m2  SpO2 99% (L)TG drain intact, site clean, NT Output thin bloody, 15cc recorded since procedure yesterday    Labs: CBC  Basename 11/20/12 1105  WBC 10.6*  HGB 7.9*  HCT 25.1*  PLT 455*   BMET  Basename 11/21/12 0410 11/20/12 0520  NA 132* 136  K 4.5 4.5  CL 92* 95*  CO2 40* 38*  GLUCOSE 160* 135*  BUN 23 20  CREATININE 0.40* 0.41*  CALCIUM 9.1 9.5   LFT  Basename 11/21/12 0410  PROT 4.8*  ALBUMIN 1.7*  AST 17  ALT 17  ALKPHOS 126*  BILITOT 0.2*  BILIDIR --  IBILI --  LIPASE --   PT/INR  Basename 11/20/12 1105  LABPROT 12.0  INR 0.89     Studies/Results: Ct Guided Abscess Drain  11/20/2012  *RADIOLOGY REPORT*  Indication: Post small bowel obstruction and lysis of adhesions, now with pelvic cul-de-sac fluid collection  CT GUIDED LEFT SIDED DRAINAGE CATHETER PLACEMENT  Comparison: CT abdomen pelvis - 11/19/2012  Medications: Fentanyl 75 mcg IV; Versed 1 mg IV  Total Moderate Sedation time: 25 minutes  Contrast: None  Complications: None immediate  Technique / Findings:  Informed written consent was obtained from the patient after a discussion of the risks, benefits and alternatives to treatment. The patient was placed prone on the CT gantry and a pre procedural CT was performed re-demonstrating the known abscess/fluid collection within the pelvic cul-de-sac measuring approximately 5.9 x 1.8 cm (image 13, series 2.  The procedure was planned.   A timeout was performed prior to the initiation of the procedure.  The left buttocks prepped and draped in the usual sterile fashion. The overlying soft tissues were anesthetized with 1% lidocaine with epinephrine.  Appropriate trajectory was planned with the use of a 22 gauge spinal needle.  An 18 gauge trocar  needle was advanced into the abscess/fluid collection and a short Amplatz super stiff wire was coiled within the collection.   Appropriate positioning was confirmed with a limited CT scan.  The tract was serially dilated allowing placement of an 8 [place] all-purpose drainage catheter, however the original catheter was noted to be positioned deep to the fluid collection.  As such, the fluid collection was accessed with a 818 gauge trocar needle and a slightly different obliquity and again, a short Amplatz superstiff wire was coiled within the collection.  The tract was dilated allowing placement of an 8-French all-purpose drainage catheter.  Appropriate positioning was confirmed with a limited postprocedural CT scan.  Approximately 5 ml of bloody fluid was aspirated.  The tube was connected to a drainage bag and sutured in place.  A dressing was placed.  The patient tolerated the procedure well without immediate post procedural complication.  Impression:  Successful CT guided placement of an 8 [place] all purpose drain catheter into the pelvis via left-sided transgluteal approach with aspiration of 5 mL of bloody fluid.  Samples were sent to the laboratory as requested by the ordering clinical team.   Original Report Authenticated By: Tacey Ruiz, MD    Ct Abdomen Pelvis W Contrast  11/19/2012  *RADIOLOGY REPORT*  Clinical Data: Postop from lysis of adhesions for small bowel obstruction.  Worsening abdominal pain  and bloating.  Personal history of ovarian carcinoma.  CT ABDOMEN AND PELVIS WITH CONTRAST  Technique:  Multidetector CT imaging of the abdomen and pelvis was performed following the standard protocol during bolus administration of intravenous contrast.  Contrast: OMNIPAQUE IOHEXOL 300 MG/ML  SOLN  Comparison: 11/05/2012  Findings: A small right pleural effusion persists but is decreased in size since previous study.  There has been resolution of small bowel obstruction since previous study.   Nasogastric tube is seen within the stomach. A small rim enhancing fluid collection persists in the cul-de-sac which measures 2 x 6 cm.  This could represent free fluid although a infected postop collection cannot definitely be excluded.  Diffuse body wall edema is noted but no other abnormal fluid collections are seen.  Tiny sub-centimeter cysts again seen in the left hepatic lobe.  No liver masses are identified.  The gallbladder, pancreas, spleen, adrenal glands, and kidneys are normal in appearance.  No evidence of hydronephrosis.  No soft tissue mass or lymphadenopathy identified.  Previous hysterectomy again noted.  IMPRESSION:  1.  Resolution of small bowel obstruction since previous study. 2.  2 x 6 cm persistent fluid collection in pelvic cul-de-sac, which could represent free fluid although a small postop abscess cannot definitely be excluded. 3.  Decreased size of small right pleural effusion.   Original Report Authenticated By: Myles Rosenthal, M.D.     Assessment/Plan: Pelvic cul de sac fluid collection s/p perc drain placement 1/15 Cont to follow output Other plans per [facility]   LOS: 23 days    Brayton El PA-C 11/21/2012 9:14 AM

## 2012-11-21 NOTE — Progress Notes (Signed)
I have seen and examined the patient and agree with the assessment and plans. Will d/c NG  Corey Laski A. Magnus Ivan  MD, FACS

## 2012-11-21 NOTE — Progress Notes (Signed)
Pt profile: 72 y/o wf admitted by [facility] 12/24 with dx of AECOP and treated accordingly with improvement in respiratory symptoms. She developed progressive abdominal distention with KUB and CT abd c/w SBO. Underwent exploratory laparotomy, lysis of adhesions 1/02. Returned to ICU on vent. PCCM asked to assist with vent/ICU mgmt   Lines, Tubes, etc: ETT 1/02 >> 1/03 R IJ CVL 1/02 >> out  Microbiology: MRSA PCR 1/02 >> NEG 1/3 BC x 2 >>not collected 1/3 UC >> NEG  Antibiotics:  vanco (empiric, fever) 1/3 >> 1/04 Zosyn (empiric, fever) 1/3 >> 1/10    Studies/Events: 12/31 CT abd: Small bowel obstruction with dilated small bowel to the level of the distal ileum in the region of the pelvis where there is abrupt change of caliber which may be related to adhesions 1/2 Hypotension post op >pressor support  1/3 low grade fever/elevated WBC >pan cx  1/9 - ileus continues, n/v with NGT clamped, stable but tenuous respiratory status   Consults:  [facility] 12/30  Best Practice: DVT: SQ hep SUP: PPI Nutrition: none Glycemic control: SSI Sedation/analgesia: PRN fentanyl   [name]: she remains stable from a pulmonary standpoint.  No increased wob.  May be getting increased GI activity  Objective:  Filed Vitals:   11/21/12 0755 11/21/12 0949 11/21/12 1046 11/21/12 1150  BP:   139/54   Pulse:   107   Temp:   98.3 F (36.8 C)   TempSrc:   Oral   Resp:  14    Height:      Weight:      SpO2: 99%  99% 99%   Gen: wd female, nad.   HEENT: NGT in place, op clear Neck: no JVD or LN Chest: decreased bs, no wheezing, a few basilar crackles. Cardiac: RRR, no MRG Abd: hypoactive, surgical incision midline c/d/i, staples intact Ext:no edema or cyanosis Neuro: a/o x 3 , moves all 4.   BMET  Lab 11/21/12 0410 11/20/12 0520 11/19/12 0610 11/18/12 0510 11/16/12 0510  NA 132* 136 135 135 135  K 4.5 4.5 -- -- --  CL 92* 95* 93* 93* 94*  CO2 40* 38* 39* 39* 40*  GLUCOSE 160* 135* 170* 132* 173*    BUN 23 20 20 16 15   CREATININE 0.40* 0.41* 0.38* 0.42* 0.44*  CALCIUM 9.1 9.5 9.6 9.2 9.0  MG 1.8 1.9 -- 2.0 --  PHOS 2.6 2.9 -- 3.3 --   CBC  Lab 11/20/12 1105 11/18/12 0510 11/16/12 0510  HGB 7.9* 8.2* 8.2*  HCT 25.1* 26.3* 25.8*  WBC 10.6* 12.1* 10.1  PLT 455* 440* 394      IMPRESSION:  SBO (small bowel obstruction) - Status post laparotomy per [facility].  NGT remains to suction.   Plan: -rec's per Surgery- she is up walking in hall today -TPN per pharmacy -trend BMP prn   Post-operative respiratory failure - Resolved.  Emphysema, severe - No acute bronchospasm or increased wob at rest.   Plan: -aggressive pulmonary hygiene IS/Flutter -pulmicort, BD's    Anemia - in setting of chronic disease / acute critical illness.  No indication for transfusion.  Plan: -trend CBC

## 2012-11-21 NOTE — Progress Notes (Signed)
14 Days Post-Op  Subjective: She just got 50 mcg of fentanyl for pain and she is very sedated, can't answer much at all. She told the nurses she was having flatus and some loose stool this AM.  Objective: Vital signs in last 24 hours: Temp:  [97.5 F (36.4 C)-99.3 F (37.4 C)] 98.5 F (36.9 C) (01/16 0446) Pulse Rate:  [96-118] 96  (01/16 0446) Resp:  [14-20] 20  (01/16 0446) BP: (111-168)/(46-90) 114/57 mmHg (01/16 0446) SpO2:  [97 %-100 %] 99 % (01/16 0755) Last BM Date: 11/19/12  Nothing PO recorded, 15 ml thru drain, Diet: sips of clears, tm 99.3, labs OK  Intake/Output from previous day: 01/15 0701 - 01/16 0700 In: 10  Out: 315 [Urine:300; Drains:15] Intake/Output this shift:    General appearance: sedated and sleepy GI: soft, not tender, minimal distension, few Bs, reported flatus  Drain is serosanguinous in color.  Lab Results:   [facility] 11/20/12 1105  WBC 10.6*  HGB 7.9*  HCT 25.1*  PLT 455*    BMET  Basename 11/21/12 0410 11/20/12 0520  NA 132* 136  K 4.5 4.5  CL 92* 95*  CO2 40* 38*  GLUCOSE 160* 135*  BUN 23 20  CREATININE 0.40* 0.41*  CALCIUM 9.1 9.5   PT/INR  Basename 11/20/12 1105  LABPROT 12.0  INR 0.89     Lab 11/21/12 0410 11/20/12 0520 11/18/12 0510  AST 17 23 36  ALT 17 22 30   ALKPHOS 126* 136* 110  BILITOT 0.2* 0.3 0.4  PROT 4.8* 5.1* 4.9*  ALBUMIN 1.7* 1.9* 1.9*     Lipase  No results found for this basename: lipase     Studies/Results: Ct Guided Abscess Drain  11/20/2012  *RADIOLOGY REPORT*  Indication: Post small bowel obstruction and lysis of adhesions, now with pelvic cul-de-sac fluid collection  CT GUIDED LEFT SIDED DRAINAGE CATHETER PLACEMENT  Comparison: CT abdomen pelvis - 11/19/2012  Medications: Fentanyl 75 mcg IV; Versed 1 mg IV  Total Moderate Sedation time: 25 minutes  Contrast: None  Complications: None immediate  Technique / Findings:  Informed written consent was obtained from the patient after a discussion  of the risks, benefits and alternatives to treatment. The patient was placed prone on the CT gantry and a pre procedural CT was performed re-demonstrating the known abscess/fluid collection within the pelvic cul-de-sac measuring approximately 5.9 x 1.8 cm (image 13, series 2.  The procedure was planned.   A timeout was performed prior to the initiation of the procedure.  The left buttocks prepped and draped in the usual sterile fashion. The overlying soft tissues were anesthetized with 1% lidocaine with epinephrine.  Appropriate trajectory was planned with the use of a 22 gauge spinal needle.  An 18 gauge trocar needle was advanced into the abscess/fluid collection and a short Amplatz super stiff wire was coiled within the collection.   Appropriate positioning was confirmed with a limited CT scan.  The tract was serially dilated allowing placement of an 8 [place] all-purpose drainage catheter, however the original catheter was noted to be positioned deep to the fluid collection.  As such, the fluid collection was accessed with a 818 gauge trocar needle and a slightly different obliquity and again, a short Amplatz superstiff wire was coiled within the collection.  The tract was dilated allowing placement of an 8-French all-purpose drainage catheter.  Appropriate positioning was confirmed with a limited postprocedural CT scan.  Approximately 5 ml of bloody fluid was aspirated.  The tube was  connected to a drainage bag and sutured in place.  A dressing was placed.  The patient tolerated the procedure well without immediate post procedural complication.  Impression:  Successful CT guided placement of an 8 [place] all purpose drain catheter into the pelvis via left-sided transgluteal approach with aspiration of 5 mL of bloody fluid.  Samples were sent to the laboratory as requested by the ordering clinical team.   Original Report Authenticated By: Tacey Ruiz, MD    Ct Abdomen Pelvis W Contrast  11/19/2012  *RADIOLOGY  REPORT*  Clinical Data: Postop from lysis of adhesions for small bowel obstruction.  Worsening abdominal pain and bloating.  Personal history of ovarian carcinoma.  CT ABDOMEN AND PELVIS WITH CONTRAST  Technique:  Multidetector CT imaging of the abdomen and pelvis was performed following the standard protocol during bolus administration of intravenous contrast.  Contrast: OMNIPAQUE IOHEXOL 300 MG/ML  SOLN  Comparison: 11/05/2012  Findings: A small right pleural effusion persists but is decreased in size since previous study.  There has been resolution of small bowel obstruction since previous study.  Nasogastric tube is seen within the stomach. A small rim enhancing fluid collection persists in the cul-de-sac which measures 2 x 6 cm.  This could represent free fluid although a infected postop collection cannot definitely be excluded.  Diffuse body wall edema is noted but no other abnormal fluid collections are seen.  Tiny sub-centimeter cysts again seen in the left hepatic lobe.  No liver masses are identified.  The gallbladder, pancreas, spleen, adrenal glands, and kidneys are normal in appearance.  No evidence of hydronephrosis.  No soft tissue mass or lymphadenopathy identified.  Previous hysterectomy again noted.  IMPRESSION:  1.  Resolution of small bowel obstruction since previous study. 2.  2 x 6 cm persistent fluid collection in pelvic cul-de-sac, which could represent free fluid although a small postop abscess cannot definitely be excluded. 3.  Decreased size of small right pleural effusion.   Original Report Authenticated By: Myles Rosenthal, M.D.     Medications:    . albuterol  2.5 mg Nebulization Q6H  . antiseptic oral rinse  15 mL Mouth Rinse QID  . bisacodyl  10 mg Rectal Daily  . budesonide  0.25 mg Nebulization QID  . chlorhexidine  15 mL Mouth Rinse BID  . heparin  5,000 Units Subcutaneous Q8H  . insulin aspart  0-9 Units Subcutaneous Q6H  . insulin glargine  10 Units Subcutaneous  Daily  . lip balm  1 application Topical BID  . pantoprazole (PROTONIX) IV  40 mg Intravenous Daily  . sodium chloride  10-40 mL Intracatheter Q12H  . 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  Hypertension  Hyperlipidemia Hx of ovarian ca with abdominal hysterectomy  Post op anemia  PCM prealbumin is 6.9, 10.4 (11/17/12) on TNA CT scan 11/19/12 shows 2 x 6 cn fkyud collection in the cul de sac thath could represent abscess, Plan to get percutaneous drain today.  Plan:  Once she wakes up we will try clear tray and see how she does, may pull NG later today.  I have decreased her ativan and her fentanyl.  She has allergies to percocet so everything lights up as allergy.  Added tramadol and tylenol.  LOS: 23 days    Jesper Stirewalt 11/21/2012

## 2012-11-21 NOTE — Progress Notes (Signed)
Clinical Social Worker continuing to follow. Clinical Social Worker noted that pt appears to be progressing medically and more appropriate at this time to initiate SNF search. Clinical Social Worker discussed with pt at bedside. Clinical Social Worker explained that due to the change in pt insurance at the beginning of the year pt insurance would not cover LTACH which was a discharge options that had been discussed with the pt. Pt expressed understanding. Clinical Social Worker discussed rehab at SNF again with pt and pt is still agreeable to initiation of SNF search as an option for discharge plan. Clinical Social Worker updated FL2 and initiated SNF search. Clinical Social Worker to follow up with pt in regard to bed offers. Clinical Social Worker to continue to follow and assist with discharge planning needs.   Jacklynn Lewis, MSW, LCSWA  Clinical Social Work [phone]

## 2012-11-22 DIAGNOSIS — E43 Unspecified severe protein-calorie malnutrition: Secondary | ICD-10-CM

## 2012-11-22 LAB — BASIC METABOLIC PANEL
CO2: 37 mEq/L — ABNORMAL HIGH (ref 19–32)
Calcium: 9.2 mg/dL (ref 8.4–10.5)
Chloride: 94 mEq/L — ABNORMAL LOW (ref 96–112)
Creatinine, Ser: 0.38 mg/dL — ABNORMAL LOW (ref 0.50–1.10)
Glucose, Bld: 157 mg/dL — ABNORMAL HIGH (ref 70–99)

## 2012-11-22 LAB — GLUCOSE, CAPILLARY
Glucose-Capillary: 158 mg/dL — ABNORMAL HIGH (ref 70–99)
Glucose-Capillary: 155 mg/dL — ABNORMAL HIGH (ref 70–99)

## 2012-11-22 MED ORDER — TRACE MINERALS CR-CU-F-FE-I-MN-MO-SE-ZN IV SOLN
INTRAVENOUS | Status: AC
  Administered 2012-11-22: 17:00:00 via INTRAVENOUS
  Filled 2012-11-22: qty 2000

## 2012-11-22 MED ORDER — FAT EMULSION 20 % IV EMUL
250.0000 mL | INTRAVENOUS | Status: AC
  Administered 2012-11-22: 250 mL via INTRAVENOUS
  Filled 2012-11-22: qty 250

## 2012-11-22 MED ORDER — ENSURE COMPLETE PO LIQD
237.0000 mL | Freq: Two times a day (BID) | ORAL | Status: DC
  Administered 2012-11-22 – 2012-11-29 (×10): 237 mL via ORAL

## 2012-11-22 MED ORDER — ENSURE PUDDING PO PUDG
1.0000 | Freq: Three times a day (TID) | ORAL | Status: DC
  Administered 2012-11-22 – 2012-11-29 (×17): 1 via ORAL
  Filled 2012-11-22 (×23): qty 1

## 2012-11-22 NOTE — Progress Notes (Signed)
Occupational Therapy Treatment Patient Details Name: [name] MRN: [id number] DOB: [birth year] Today's Date: 11/22/2012 Time: 7829-5621 OT Time Calculation (min): 31 min  OT Assessment / Plan / Recommendation Comments on Treatment Session Pt fatigues rapidly with activity and requires encouragement to remember to do her PLB. She is motivated and wants to do what she can for herself.    Follow Up Recommendations  SNF    Barriers to Discharge       Equipment Recommendations  3 in 1 bedside comode    Recommendations for Other Services    Frequency Min 2X/week   Plan Discharge plan remains appropriate    Precautions / Restrictions Precautions Precautions: Fall Precaution Comments: O2        ADL  Lower Body Dressing: Simulated;Moderate assistance;Other (comment) (with sock aid only. difficult due to edema) Where Assessed - Lower Body Dressing: Unsupported sitting Toilet Transfer: Performed;Minimal assistance Toilet Transfer Method: Stand pivot Toilet Transfer Equipment: Bedside commode Toileting - Clothing Manipulation and Hygiene: Performed;+1 Total assistance Where Assessed - Toileting Clothing Manipulation and Hygiene: Sit to stand from 3-in-1 or toilet ADL Comments: Pt expecting a breathing treatment very soon so she declined getting up to the chair yet. She did agree to transfer onto [facility] and then back to bed. Informed nursing who states they can assist pt up to chair later this am. Pt fatigues rapidly with activity and didnt feel she could  reach behind and wipe her posterior periarea after BM today. Therefore she was total assist. Encouraged PLB throughout task. Educated  on AE kit option for LB dressing and pt tried sock aid at [facility] but it was diffficult to get sock to slide off of sock aid due to edema in foot. Wrote down  all AE for pt in case she decides to obtain at DME store.     OT Diagnosis:    OT Problem List:   OT Treatment Interventions:     OT Goals ADL  Goals ADL Goal: Toilet Transfer - Progress: Progressing toward goals ADL Goal: Toileting - Clothing Manipulation - Progress: Not progressing (due to decreased activity tolerance) ADL Goal: Toileting - Hygiene - Progress: Not progressing ADL Goal: Additional Goal #1 - Progress: Progressing toward goals  Visit Information  Last OT Received On: 11/22/12 Assistance Needed: +1    Subjective Data  Subjective: I am supposed to get a breathing treatment soon Patient Stated Goal: wants to get stronger   Prior Functioning       Cognition  Overall Cognitive Status: Appears within functional limits for tasks assessed/performed Arousal/Alertness: Awake/alert Orientation Level: Appears intact for tasks assessed Behavior During Session: [facility] for tasks performed    Mobility  Shoulder Instructions Bed Mobility Supine to Sit: 3: Mod assist;HOB elevated Sit to Supine: HOB elevated;4: Min assist;3: Mod assist Details for Bed Mobility Assistance: assist for trunk to upright and then to lift LEs onto bed to return to supine.  Transfers Transfers: Sit to Stand;Stand to Sit Sit to Stand: 4: Min assist;With upper extremity assist;From bed;From chair/3-in-1 Stand to Sit: 4: Min assist;With upper extremity assist;To chair/3-in-1;To bed Details for Transfer Assistance: min verbal cues for safety       Exercises      Balance     End of Session OT - End of Session Activity Tolerance: Patient limited by fatigue Patient left: in bed;with call bell/phone within reach  GO     Lennox Laity [phone] 11/22/2012, 9:39 AM

## 2012-11-22 NOTE — Progress Notes (Signed)
Clinical Social Worker continuing to follow for discharge planning. Clinical Social Worker met with pt and pt friend, Bonita Quin at bedside. Pt reports that she is planning to go to rehab upon discharge. Clinical Social Worker encouraged pt to review list during weekend to have an idea of facilities that she may be interested in. Clinical Social Worker awaiting to provide bed offers until it is known exactly what pt is going to need at discharge as pt had NG tube removed yesterday and per MD note, hopeful to d/c TPN soon. Clinical Social Worker to follow up with pt on Monday and continue to assist with discharge planning needs.   Jacklynn Lewis, MSW, LCSWA  Clinical Social Work [phone]

## 2012-11-22 NOTE — Progress Notes (Signed)
Pt profile: 72 y/o wf admitted by [facility] 12/24 with dx of AECOP and treated accordingly with improvement in respiratory symptoms. She developed progressive abdominal distention with KUB and CT abd c/w SBO. Underwent exploratory laparotomy, lysis of adhesions 1/02. Returned to ICU on vent. PCCM asked to assist with vent/ICU mgmt   Lines, Tubes, etc: ETT 1/02 >> 1/03 R IJ CVL 1/02 >> out  Microbiology: MRSA PCR 1/02 >> NEG 1/3 BC x 2 >>not collected 1/3 UC >> NEG  Antibiotics:  vanco (empiric, fever) 1/3 >> 1/04 Zosyn (empiric, fever) 1/3 >> 1/10    Studies/Events: 12/31 CT abd: Small bowel obstruction with dilated small bowel to the level of the distal ileum in the region of the pelvis where there is abrupt change of caliber which may be related to adhesions 1/2 Hypotension post op >pressor support  1/3 low grade fever/elevated WBC >pan cx  1/9 - ileus continues, n/v with NGT clamped, stable but tenuous respiratory status   Consults:  CCS 12/30  Best Practice: DVT: SQ hep SUP: PPI Nutrition: none Glycemic control: SSI Sedation/analgesia: PRN fentanyl   [name]: she remains stable from a pulmonary standpoint.  No increased wob.  NG tube out, and taking liquids.  She notes a little more resistance to airflow, but not significant.   Objective:  Filed Vitals:   11/21/12 2115 11/21/12 2158 11/22/12 0217 11/22/12 0501  BP:  120/55  139/70  Pulse:  98  104  Temp:  98.9 F (37.2 C)  97.8 F (36.6 C)  TempSrc:  Oral  Oral  Resp:  20  18  Height:      Weight:      SpO2: 93% 98% 94% 98%   Gen: wd female, nad.   HEENT: NG out, OP clear Neck: no JVD or LN Chest: decreased bs, no wheezing Cardiac: RRR, no MRG Abd: decreased bs, but present.  Ext:no edema or cyanosis Neuro: a/o x 3 , moves all 4.   BMET  Lab 11/22/12 0510 11/21/12 0410 11/20/12 0520 11/19/12 0610 11/18/12 0510  NA 132* 132* 136 135 135  K 4.6 4.5 -- -- --  CL 94* 92* 95* 93* 93*  CO2 37* 40* 38* 39* 39*    GLUCOSE 157* 160* 135* 170* 132*  BUN 20 23 20 20 16   CREATININE 0.38* 0.40* 0.41* 0.38* 0.42*  CALCIUM 9.2 9.1 9.5 9.6 9.2  MG -- 1.8 1.9 -- 2.0  PHOS -- 2.6 2.9 -- 3.3   CBC  Lab 11/20/12 1105 11/18/12 0510 11/16/12 0510  HGB 7.9* 8.2* 8.2*  HCT 25.1* 26.3* 25.8*  WBC 10.6* 12.1* 10.1  PLT 455* 440* 394      IMPRESSION:  SBO (small bowel obstruction) - Status post laparotomy per CCS.  NG out.  Plan: -rec's per Surgery- she is up walking in hall today -TPN per pharmacy, but hopefully can d/c soon.  -trend BMP prn   Post-operative respiratory failure - Resolved.  Emphysema, severe - No acute bronchospasm or increased wob at rest.   Plan: -aggressive pulmonary hygiene IS/Flutter -pulmicort, BD's    Anemia - in setting of chronic disease / acute critical illness.  No indication for transfusion.  Plan: - check cbc in am.

## 2012-11-22 NOTE — Progress Notes (Signed)
15 Days Post-Op  Subjective: Bowels working.  Tolerating clear liquids.  Objective: Vital signs in last 24 hours: Temp:  [97.8 F (36.6 C)-98.9 F (37.2 C)] 97.8 F (36.6 C) (01/17 0501) Pulse Rate:  [98-107] 104  (01/17 0501) Resp:  [14-20] 18  (01/17 0501) BP: (120-139)/(54-70) 139/70 mmHg (01/17 0501) SpO2:  [93 %-100 %] 98 % (01/17 0501) Last BM Date: 11/21/12  Intake/Output from previous day: 01/16 0701 - 01/17 0700 In: 25 [I.V.:10] Out: 557 [Urine:550; Drains:7] Intake/Output this shift:    PE: General- In NAD Abdomen-soft, mild distension, incision clean and intact  Lab Results:   Basename 11/20/12 1105  WBC 10.6*  HGB 7.9*  HCT 25.1*  PLT 455*   BMET  Basename 11/22/12 0510 11/21/12 0410  NA 132* 132*  K 4.6 4.5  CL 94* 92*  CO2 37* 40*  GLUCOSE 157* 160*  BUN 20 23  CREATININE 0.38* 0.40*  CALCIUM 9.2 9.1   PT/INR  Basename 11/20/12 1105  LABPROT 12.0  INR 0.89   Comprehensive Metabolic Panel:    Component Value Date/Time   NA 132* 11/22/2012 0510   K 4.6 11/22/2012 0510   CL 94* 11/22/2012 0510   CO2 37* 11/22/2012 0510   BUN 20 11/22/2012 0510   CREATININE 0.38* 11/22/2012 0510   GLUCOSE 157* 11/22/2012 0510   CALCIUM 9.2 11/22/2012 0510   AST 17 11/21/2012 0410   ALT 17 11/21/2012 0410   ALKPHOS 126* 11/21/2012 0410   BILITOT 0.2* 11/21/2012 0410   PROT 4.8* 11/21/2012 0410   ALBUMIN 1.7* 11/21/2012 0410     Studies/Results: Ct Guided Abscess Drain  11/20/2012  *RADIOLOGY REPORT*  Indication: Post small bowel obstruction and lysis of adhesions, now with pelvic cul-de-sac fluid collection  CT GUIDED LEFT SIDED DRAINAGE CATHETER PLACEMENT  Comparison: CT abdomen pelvis - 11/19/2012  Medications: Fentanyl 75 mcg IV; Versed 1 mg IV  Total Moderate Sedation time: 25 minutes  Contrast: None  Complications: None immediate  Technique / Findings:  Informed written consent was obtained from the patient after a discussion of the risks, benefits and  alternatives to treatment. The patient was placed prone on the CT gantry and a pre procedural CT was performed re-demonstrating the known abscess/fluid collection within the pelvic cul-de-sac measuring approximately 5.9 x 1.8 cm (image 13, series 2.  The procedure was planned.   A timeout was performed prior to the initiation of the procedure.  The left buttocks prepped and draped in the usual sterile fashion. The overlying soft tissues were anesthetized with 1% lidocaine with epinephrine.  Appropriate trajectory was planned with the use of a 22 gauge spinal needle.  An 18 gauge trocar needle was advanced into the abscess/fluid collection and a short Amplatz super stiff wire was coiled within the collection.   Appropriate positioning was confirmed with a limited CT scan.  The tract was serially dilated allowing placement of an 8 [place] all-purpose drainage catheter, however the original catheter was noted to be positioned deep to the fluid collection.  As such, the fluid collection was accessed with a 818 gauge trocar needle and a slightly different obliquity and again, a short Amplatz superstiff wire was coiled within the collection.  The tract was dilated allowing placement of an 8-French all-purpose drainage catheter.  Appropriate positioning was confirmed with a limited postprocedural CT scan.  Approximately 5 ml of bloody fluid was aspirated.  The tube was connected to a drainage bag and sutured in place.  A dressing was  placed.  The patient tolerated the procedure well without immediate post procedural complication.  Impression:  Successful CT guided placement of an 8 [place] all purpose drain catheter into the pelvis via left-sided transgluteal approach with aspiration of 5 mL of bloody fluid.  Samples were sent to the laboratory as requested by the ordering clinical team.   Original Report Authenticated By: Tacey Ruiz, MD     Anti-infectives: Anti-infectives     Start     Dose/Rate Route Frequency  Ordered Stop   11/09/12 0000   vancomycin (VANCOCIN) 750 mg in sodium chloride 0.9 % 150 mL IVPB  Status:  Discontinued        750 mg 150 mL/hr over 60 Minutes Intravenous Every 12 hours 11/08/12 1023 11/09/12 1152   11/08/12 1200  piperacillin-tazobactam (ZOSYN) IVPB 3.375 g       3.375 g 12.5 mL/hr over 240 Minutes Intravenous Every 8 hours 11/08/12 1023 11/15/12 0730   11/08/12 1100   vancomycin (VANCOCIN) IVPB 1000 mg/200 mL premix        1,000 mg 200 mL/hr over 60 Minutes Intravenous  Once 11/08/12 1023 11/08/12 1322   11/04/12 1200   levofloxacin (LEVAQUIN) IVPB 500 mg  Status:  Discontinued        500 mg 100 mL/hr over 60 Minutes Intravenous Daily 11/04/12 1124 11/05/12 1436   11/01/12 1615   fluconazole (DIFLUCAN) tablet 200 mg        200 mg Oral  Once 11/01/12 1607 11/01/12 1636   11/01/12 1000   levofloxacin (LEVAQUIN) IVPB 750 mg  Status:  Discontinued        750 mg 100 mL/hr over 90 Minutes Intravenous Every 48 hours 10/30/12 1108 10/31/12 1428   11/01/12 1000   levofloxacin (LEVAQUIN) tablet 750 mg  Status:  Discontinued        750 mg Oral Every 48 hours 10/31/12 1428 11/04/12 1124   10/29/12 1045   levofloxacin (LEVAQUIN) IVPB 750 mg  Status:  Discontinued        750 mg 100 mL/hr over 90 Minutes Intravenous Every 24 hours 10/29/12 1034 10/30/12 1108          Assessment  Small bowel obstruction s/p Diagnostic laparoscopy, exploratory laparotomy, lysis of adhesions, 11/07/2012, Jessica Filler, MD   Post op ileus-improving  End stage COPD  Protein-calorie malnutrition, severe  Pelvic fluid collection-drained 11/20/12; culture pending   LOS: 24 days   Plan: Advance to full liquids.   [name] 11/22/2012

## 2012-11-22 NOTE — Progress Notes (Signed)
PARENTERAL NUTRITION CONSULT NOTE   Pharmacy Consult for TNA Indication: SBO, post-op ileus  Allergies  Allergen Reactions  . Oxycodone-Acetaminophen     REACTION: headaches,nausea   Patient Measurements: Height: 5\' 5"  (165.1 cm) Weight: 114 lb 6.7 oz (51.9 kg) IBW/kg (Calculated) : 57  Usual Weight: 43 kg  Vital Signs: Temp: 97.8 F (36.6 C) (01/17 0501) Temp src: Oral (01/17 0501) BP: 139/70 mmHg (01/17 0501) Pulse Rate: 104  (01/17 0501) Intake/Output from previous day: 01/16 0701 - 01/17 0700 In: 25 [I.V.:10] Out: 557 [Urine:550; Drains:7] Intake/Output from this shift:    Labs:  [facility] 11/20/12 1105  WBC 10.6*  HGB 7.9*  HCT 25.1*  PLT 455*  APTT 46*  INR 0.89    Basename 11/22/12 0510 11/21/12 0410 11/20/12 0520  NA 132* 132* 136  K 4.6 4.5 4.5  CL 94* 92* 95*  CO2 37* 40* 38*  GLUCOSE 157* 160* 135*  BUN 20 23 20   CREATININE 0.38* 0.40* 0.41*  LABCREA -- -- --  CREAT24HRUR -- -- --  CALCIUM 9.2 9.1 9.5  MG -- 1.8 1.9  PHOS -- 2.6 2.9  PROT -- 4.8* 5.1*  ALBUMIN -- 1.7* 1.9*  AST -- 17 23  ALT -- 17 22  ALKPHOS -- 126* 136*  BILITOT -- 0.2* 0.3  BILIDIR -- -- --  IBILI -- -- --  PREALBUMIN -- -- --  TRIG -- -- --  CHOLHDL -- -- --  CHOL -- -- --  Corrected Ca=11 Estimated Creatinine Clearance: 52.8 ml/min (by C-G formula based on Cr of 0.38).   Basename 11/22/12 0605 11/21/12 2351 11/21/12 1752  GLUCAP 158* 160* 128*   Medications:  Scheduled:     . albuterol  2.5 mg Nebulization Q6H  . antiseptic oral rinse  15 mL Mouth Rinse QID  . bisacodyl  10 mg Rectal Daily  . budesonide  0.25 mg Nebulization QID  . chlorhexidine  15 mL Mouth Rinse BID  . heparin  5,000 Units Subcutaneous Q8H  . insulin aspart  0-9 Units Subcutaneous Q6H  . insulin glargine  10 Units Subcutaneous Daily  . lip balm  1 application Topical BID  . pantoprazole (PROTONIX) IV  40 mg Intravenous Daily  . sodium chloride  10-40 mL Intracatheter Q12H  .  tiotropium  18 mcg Inhalation Daily   Infusions:     . dextrose 5 % and 0.45 % NaCl with KCl 20 mEq/L 30 mL/hr at 11/21/12 1312  . [EXPIRED] TPN (CLINIMIX) +/- additives 70 mL/hr at 11/20/12 1733   And  . [EXPIRED] fat emulsion 250 mL (11/20/12 1733)  . TPN (CLINIMIX) +/- additives 70 mL/hr at 11/21/12 1710   Insulin Requirements in the past 24 hours:  CBG 128-175 Novolog Sensitive SSI -  7 units in past 24 hours Lantus 10 units every AM  Current Nutrition:  Tolerated CL, advancing to FL today Clinimix-E 5/20 @ 70 ml/hr Lipids 20% @ 10 ml/hr MWF ONLY    Fluids: MIVF: D5 1/2 NS with 20 mEQ KCl @ 30 ml/hr  Assessment 71 yoF admitted 10/29/12 with COPD exacerbation. She developed progressive abdominal distention with KUB and CT abd c/w SBO. Underwent exploratory laparotomy, lysis of adhesions 11/07/12 but had prolonged postoperative ileus.  TNA started 11/11/12.   Electrolytes:  Na 132 (cannot adjust in TNA); Cl slightly low, CO2 slightly elevated - c/w COPD; corrected Ca slightly high but total Ca WNL.  CBG's: elevated > goal of 150 for a few readings but  overall acceptable - continue current SSI and Lantus   Liver: AlkPhos elevated slightly 1/16  Renal: SCr stable; CrCl 52 ml/min   Prealbumin improving: 6.9 (1/7), 10.4 (1/13)  TGs WNL 1/13  Tolerated clear liquids, advancing to full liquids today.  Nutritional Goals: per RD Kcal: 1500-1750 Kcal/day  Protein: 70-90g/day  Fluid: 1.5-1.7L  Clinimix E 5/20 at goal rate of 70 ml/hr delivers 84 gm protein/d, avg 1684 kcal/day.  Plan:   Continue Clinimix-E 5/20 at goal rate 70 ml/hr  Lipids, MVI and trace elements MWF only due to national back order.  Continue CBG q6h, Novolog sensitive-scale coverage, along with Lantus 10 units q AM  TNA lab panel Mon/Thurs  Follow tolerance and advancement of diet.  Await orders to begin TNA wean when appropriate.  Hope Budds PharmD, BCPS Pager [phone] 11/22/2012 8:58  AM

## 2012-11-22 NOTE — Progress Notes (Signed)
NUTRITION FOLLOW UP  Intervention:   - Ensure Complete BID - Ensure pudding BID - TPN per pharmacy - Diet advancement per MD - Will continue to monitor  Nutrition Dx:   Inadequate oral intake r/t inability to eat as evidenced by NPO - now r/t poor appetite as evidenced by <25% meal intake.   Goal:   1. Intake >50% of meals - not met, now changed to intake >50% of meals/supplements 2. Nutrition support, initiation if appropriate - met, pt continues TPN 3. TPN to meet >90% of estimated nutritional needs - met   Monitor:   Weights, labs, intake, diet advancement, TPN  Assessment:   NGT d/c yesterday. Pt only ate 2 bites of jello this morning. Pt complains of having no appetite. Pt agreeable to trying Ensure Complete and pudding. Provided pt with Ensure during visit. Last BM yesterday.   Pt getting Clinimix E 5/20 at 70 ml/hr which delivers 84 gm protein/d, avg 1684 kcal/day getting 112% estimated calorie needs, 120% estimated protein needs.   Additional IVF of D5 1/2 NS at 57ml/hr.   - Sodium slightly low, cannot adjust in TPN - Alk phos elevated but slightly improved today - PALB improved from 6.9 mg/dL on 1/7 to 16.1 mg/dL on 0/96 - CBGs slightly elevated   CBG (last 3)   Basename 11/22/12 1142 11/22/12 0605 11/21/12 2351  GLUCAP 165* 158* 160*     Height: Ht Readings from Last 1 Encounters:  11/07/12 5\' 5"  (1.651 m)    Weight Status:   Wt Readings from Last 1 Encounters:  11/10/12 114 lb 6.7 oz (51.9 kg)    Re-estimated needs:  Kcal: 1500-1750 Protein: 70-90g Fluid: 1.5-1.7L/day  Skin: Moderate pitting generalized edema  Diet Order: Full Liquid   Intake/Output Summary (Last 24 hours) at 11/22/12 1100 Last data filed at 11/22/12 0454  Gross per 24 hour  Intake     25 ml  Output    557 ml  Net   -532 ml    Last BM: 1/16   Labs:   Lab 11/22/12 0510 11/21/12 0410 11/20/12 0520 11/18/12 0510  NA 132* 132* 136 --  K 4.6 4.5 4.5 --  CL 94* 92* 95*  --  CO2 37* 40* 38* --  BUN 20 23 20  --  CREATININE 0.38* 0.40* 0.41* --  CALCIUM 9.2 9.1 9.5 --  MG -- 1.8 1.9 2.0  PHOS -- 2.6 2.9 3.3  GLUCOSE 157* 160* 135* --    CBG (last 3)   Basename 11/22/12 0605 11/21/12 2351 11/21/12 1752  GLUCAP 158* 160* 128*    Scheduled Meds:   . albuterol  2.5 mg Nebulization Q6H  . antiseptic oral rinse  15 mL Mouth Rinse QID  . bisacodyl  10 mg Rectal Daily  . budesonide  0.25 mg Nebulization QID  . chlorhexidine  15 mL Mouth Rinse BID  . heparin  5,000 Units Subcutaneous Q8H  . insulin aspart  0-9 Units Subcutaneous Q6H  . insulin glargine  10 Units Subcutaneous Daily  . lip balm  1 application Topical BID  . pantoprazole (PROTONIX) IV  40 mg Intravenous Daily  . sodium chloride  10-40 mL Intracatheter Q12H  . tiotropium  18 mcg Inhalation Daily    Continuous Infusions:   . dextrose 5 % and 0.45 % NaCl with KCl 20 mEq/L 30 mL/hr at 11/21/12 1312  . TPN (CLINIMIX) +/- additives     And  . fat emulsion    . TPN (CLINIMIX) +/-  additives 70 mL/hr at 11/21/12 [address] [place], [place], [place] [phone] Pager [phone] After Hours Pager

## 2012-11-22 NOTE — Progress Notes (Signed)
15 Days Post-Op  Subjective: Feeling better. Drain with minimal tenderness.  Objective: Vital signs in last 24 hours: Temp:  [97.8 F (36.6 C)-98.9 F (37.2 C)] 97.8 F (36.6 C) (01/17 0501) Pulse Rate:  [98-107] 104  (01/17 0501) Resp:  [16-20] 18  (01/17 0501) BP: (120-139)/(54-70) 139/70 mmHg (01/17 0501) SpO2:  [93 %-100 %] 96 % (01/17 0921) Last BM Date: 11/21/12  Intake/Output from previous day: 01/16 0701 - 01/17 0700 In: 25 [I.V.:10] Out: 557 [Urine:550; Drains:7] Intake/Output this shift:    PE: awake, alert, denies pain, nausea or vomiting. Transgluteal drain intact with site clean and dry. Blood tinged serous fluid in JP. Getting flushes of 15 ml daily which patient states does not really hurt. Output is declining. Cultures remain pending with [facility] on preliminary. No antibiotic coverage at this time - see that last dose of zosyn was on 11/15/12.  Lab Results:   [facility] 11/20/12 1105  WBC 10.6*  HGB 7.9*  HCT 25.1*  PLT 455*   BMET  Basename 11/22/12 0510 11/21/12 0410  NA 132* 132*  K 4.6 4.5  CL 94* 92*  CO2 37* 40*  GLUCOSE 157* 160*  BUN 20 23  CREATININE 0.38* 0.40*  CALCIUM 9.2 9.1   PT/INR  Basename 11/20/12 1105  LABPROT 12.0  INR 0.89    Studies/Results: Ct Guided Abscess Drain  11/20/2012  *RADIOLOGY REPORT*  Indication: Post small bowel obstruction and lysis of adhesions, now with pelvic cul-de-sac fluid collection  CT GUIDED LEFT SIDED DRAINAGE CATHETER PLACEMENT  Comparison: CT abdomen pelvis - 11/19/2012  Medications: Fentanyl 75 mcg IV; Versed 1 mg IV  Total Moderate Sedation time: 25 minutes  Contrast: None  Complications: None immediate  Technique / Findings:  Informed written consent was obtained from the patient after a discussion of the risks, benefits and alternatives to treatment. The patient was placed prone on the CT gantry and a pre procedural CT was performed re-demonstrating the known abscess/fluid collection within the pelvic  cul-de-sac measuring approximately 5.9 x 1.8 cm (image 13, series 2.  The procedure was planned.   A timeout was performed prior to the initiation of the procedure.  The left buttocks prepped and draped in the usual sterile fashion. The overlying soft tissues were anesthetized with 1% lidocaine with epinephrine.  Appropriate trajectory was planned with the use of a 22 gauge spinal needle.  An 18 gauge trocar needle was advanced into the abscess/fluid collection and a short Amplatz super stiff wire was coiled within the collection.   Appropriate positioning was confirmed with a limited CT scan.  The tract was serially dilated allowing placement of an 8 [place] all-purpose drainage catheter, however the original catheter was noted to be positioned deep to the fluid collection.  As such, the fluid collection was accessed with a 818 gauge trocar needle and a slightly different obliquity and again, a short Amplatz superstiff wire was coiled within the collection.  The tract was dilated allowing placement of an 8-French all-purpose drainage catheter.  Appropriate positioning was confirmed with a limited postprocedural CT scan.  Approximately 5 ml of bloody fluid was aspirated.  The tube was connected to a drainage bag and sutured in place.  A dressing was placed.  The patient tolerated the procedure well without immediate post procedural complication.  Impression:  Successful CT guided placement of an 8 [place] all purpose drain catheter into the pelvis via left-sided transgluteal approach with aspiration of 5 mL of bloody fluid.  Samples were  sent to the laboratory as requested by the ordering clinical team.   Original Report Authenticated By: Tacey Ruiz, MD     Anti-infectives: Anti-infectives     Start     Dose/Rate Route Frequency Ordered Stop   11/09/12 0000   vancomycin (VANCOCIN) 750 mg in sodium chloride 0.9 % 150 mL IVPB  Status:  Discontinued        750 mg 150 mL/hr over 60 Minutes Intravenous Every 12  hours 11/08/12 1023 11/09/12 1152   11/08/12 1200   piperacillin-tazobactam (ZOSYN) IVPB 3.375 g        3.375 g 12.5 mL/hr over 240 Minutes Intravenous Every 8 hours 11/08/12 1023 11/15/12 0730   11/08/12 1100   vancomycin (VANCOCIN) IVPB 1000 mg/200 mL premix        1,000 mg 200 mL/hr over 60 Minutes Intravenous  Once 11/08/12 1023 11/08/12 1322   11/04/12 1200   levofloxacin (LEVAQUIN) IVPB 500 mg  Status:  Discontinued        500 mg 100 mL/hr over 60 Minutes Intravenous Daily 11/04/12 1124 11/05/12 1436   11/01/12 1615   fluconazole (DIFLUCAN) tablet 200 mg        200 mg Oral  Once 11/01/12 1607 11/01/12 1636   11/01/12 1000   levofloxacin (LEVAQUIN) IVPB 750 mg  Status:  Discontinued        750 mg 100 mL/hr over 90 Minutes Intravenous Every 48 hours 10/30/12 1108 10/31/12 1428   11/01/12 1000   levofloxacin (LEVAQUIN) tablet 750 mg  Status:  Discontinued        750 mg Oral Every 48 hours 10/31/12 1428 11/04/12 1124   10/29/12 1045   levofloxacin (LEVAQUIN) IVPB 750 mg  Status:  Discontinued        750 mg 100 mL/hr over 90 Minutes Intravenous Every 24 hours 10/29/12 1034 10/30/12 1108          Assessment/Plan: s/p Procedure(s) (LRB) with comments: LAPAROSCOPY DIAGNOSTIC (N/A) EXPLORATORY LAPAROTOMY (N/A) LAPAROSCOPIC LYSIS OF ADHESIONS (N/A) LYSIS OF ADHESION ()  S/p transgluteal drain placement for fluid in cul de sac post op - cultures pending. To continue drain for now until cultures finalized and output declines.    LOS: 24 days    CAMPBELL,PAMELA D 11/22/2012

## 2012-11-23 DIAGNOSIS — J9589 Other postprocedural complications and disorders of respiratory system, not elsewhere classified: Secondary | ICD-10-CM

## 2012-11-23 LAB — CBC
MCH: 31 pg (ref 26.0–34.0)
MCV: 100.4 fL — ABNORMAL HIGH (ref 78.0–100.0)
Platelets: 474 10*3/uL — ABNORMAL HIGH (ref 150–400)
RBC: 2.45 MIL/uL — ABNORMAL LOW (ref 3.87–5.11)
RDW: 13.4 % (ref 11.5–15.5)
WBC: 10.5 10*3/uL (ref 4.0–10.5)

## 2012-11-23 LAB — CULTURE, ROUTINE-ABSCESS: Culture: NO GROWTH

## 2012-11-23 LAB — GLUCOSE, CAPILLARY
Glucose-Capillary: 138 mg/dL — ABNORMAL HIGH (ref 70–99)
Glucose-Capillary: 121 mg/dL — ABNORMAL HIGH (ref 70–99)
Glucose-Capillary: 109 mg/dL — ABNORMAL HIGH (ref 70–99)

## 2012-11-23 MED ORDER — INSULIN ASPART 100 UNIT/ML ~~LOC~~ SOLN
0.0000 [IU] | Freq: Three times a day (TID) | SUBCUTANEOUS | Status: DC
  Administered 2012-11-23 – 2012-11-29 (×13): 1 [IU] via SUBCUTANEOUS

## 2012-11-23 MED ORDER — INSULIN ASPART 100 UNIT/ML ~~LOC~~ SOLN: 0.0000 [IU] | Freq: Every day | SUBCUTANEOUS | Status: DC

## 2012-11-23 MED ORDER — CLINIMIX E/DEXTROSE (5/15) 5 % IV SOLN
INTRAVENOUS | Status: AC
  Administered 2012-11-23: 17:00:00 via INTRAVENOUS
  Filled 2012-11-23: qty 2000

## 2012-11-23 NOTE — Progress Notes (Signed)
Pt profile: 72 y/o wf admitted by [facility] 12/24 with dx of AECOP and treated accordingly with improvement in respiratory symptoms. She developed progressive abdominal distention with KUB and CT abd c/w SBO. Underwent exploratory laparotomy, lysis of adhesions 1/02. Returned to ICU on vent. PCCM asked to assist with vent/ICU mgmt   Lines, Tubes, etc: ETT 1/02 >> 1/03 R IJ CVL 1/02 >> out  Microbiology: MRSA PCR 1/02 >> NEG 1/3 BC x 2 >>not collected 1/3 UC >> NEG  Antibiotics:  vanco (empiric, fever) 1/3 >> 1/04 Zosyn (empiric, fever) 1/3 >> 1/10    Studies/Events: 12/31 CT abd: Small bowel obstruction with dilated small bowel to the level of the distal ileum in the region of the pelvis where there is abrupt change of caliber which may be related to adhesions 1/2 Hypotension post op >pressor support  1/3 low grade fever/elevated WBC >pan cx  1/9 - ileus continues, n/v with NGT clamped, stable but tenuous respiratory status   Consults:  CCS 12/30  Best Practice: DVT: SQ hep SUP: PPI Nutrition: none Glycemic control: SSI Sedation/analgesia: PRN fentanyl   [name]: she remains stable from a pulmonary standpoint.  No increased wob.  NG tube out, and taking liquids.   Objective:  Filed Vitals:   11/22/12 2050 11/23/12 0245 11/23/12 0544 11/23/12 0812  BP: 126/64  152/64   Pulse: 108  111   Temp: 98.5 F (36.9 C)  97.9 F (36.6 C)   TempSrc: Oral  Oral   Resp: 16  18   Height:      Weight:      SpO2: 100% 99% 97% 95%   Gen: wd female, nad.   HEENT: NG out, OP clear Neck: no JVD or LN Chest: decreased bs, no wheezing Cardiac: RRR, no MRG Abd: decreased bs, but present.  Ext:no edema or cyanosis Neuro: a/o x 3 , moves all 4.   BMET  Lab 11/22/12 0510 11/21/12 0410 11/20/12 0520 11/19/12 0610 11/18/12 0510  NA 132* 132* 136 135 135  K 4.6 4.5 -- -- --  CL 94* 92* 95* 93* 93*  CO2 37* 40* 38* 39* 39*  GLUCOSE 157* 160* 135* 170* 132*  BUN 20 23 20 20 16     CREATININE 0.38* 0.40* 0.41* 0.38* 0.42*  CALCIUM 9.2 9.1 9.5 9.6 9.2  MG -- 1.8 1.9 -- 2.0  PHOS -- 2.6 2.9 -- 3.3   CBC  Lab 11/23/12 0525 11/20/12 1105 11/18/12 0510  HGB 7.6* 7.9* 8.2*  HCT 24.6* 25.1* 26.3*  WBC 10.5 10.6* 12.1*  PLT 474* 455* 440*      IMPRESSION:  SBO (small bowel obstruction) - Status post laparotomy per [facility].  NG out.  Plan: -rec's per Surgery- -TPN per pharmacy, but hopefully can d/c soon.  -trend BMP prn   Post-operative respiratory failure - Resolved.  Emphysema, severe - No acute bronchospasm or increased wob at rest.   Plan: -aggressive pulmonary hygiene IS/Flutter -pulmicort, BD's    Anemia - in setting of chronic disease / acute critical illness.  No indication for transfusion, but if drops any further, will need to consider Plan: - check cbc qod.

## 2012-11-23 NOTE — Progress Notes (Signed)
Patient ID: [name], female   DOB: 1940/12/13, 72 y.o.   MRN: [id number] 16 Days Post-Op  Subjective: No C/O this AM.  Had some nausea yesterday but none today. Tol FL OK  Objective: Vital signs in last 24 hours: Temp:  [97.6 F (36.4 C)-98.5 F (36.9 C)] 97.9 F (36.6 C) (01/18 0544) Pulse Rate:  [100-111] 111  (01/18 0544) Resp:  [16-18] 18  (01/18 0544) BP: (126-152)/(64-70) 152/64 mmHg (01/18 0544) SpO2:  [95 %-100 %] 95 % (01/18 0812) Last BM Date: 11/21/12  Intake/Output from previous day: 01/17 0701 - 01/18 0700 In: -  Out: 1400 [Urine:1400] Intake/Output this shift:    General appearance: alert, cooperative and no distress GI: normal findings: soft, non-tender Incision/Wound: Clean and dry  Lab Results:   [facility] 11/23/12 0525 11/20/12 1105  WBC 10.5 10.6*  HGB 7.6* 7.9*  HCT 24.6* 25.1*  PLT 474* 455*   BMET  Basename 11/22/12 0510 11/21/12 0410  NA 132* 132*  K 4.6 4.5  CL 94* 92*  CO2 37* 40*  GLUCOSE 157* 160*  BUN 20 23  CREATININE 0.38* 0.40*  CALCIUM 9.2 9.1     Studies/Results: No results found.  Anti-infectives: Anti-infectives     Start     Dose/Rate Route Frequency Ordered Stop   11/09/12 0000   vancomycin (VANCOCIN) 750 mg in sodium chloride 0.9 % 150 mL IVPB  Status:  Discontinued        750 mg 150 mL/hr over 60 Minutes Intravenous Every 12 hours 11/08/12 1023 11/09/12 1152   11/08/12 1200  piperacillin-tazobactam (ZOSYN) IVPB 3.375 g       3.375 g 12.5 mL/hr over 240 Minutes Intravenous Every 8 hours 11/08/12 1023 11/15/12 0730   11/08/12 1100   vancomycin (VANCOCIN) IVPB 1000 mg/200 mL premix        1,000 mg 200 mL/hr over 60 Minutes Intravenous  Once 11/08/12 1023 11/08/12 1322   11/04/12 1200   levofloxacin (LEVAQUIN) IVPB 500 mg  Status:  Discontinued        500 mg 100 mL/hr over 60 Minutes Intravenous Daily 11/04/12 1124 11/05/12 1436   11/01/12 1615   fluconazole (DIFLUCAN) tablet 200 mg        200 mg Oral   Once 11/01/12 1607 11/01/12 1636   11/01/12 1000   levofloxacin (LEVAQUIN) IVPB 750 mg  Status:  Discontinued        750 mg 100 mL/hr over 90 Minutes Intravenous Every 48 hours 10/30/12 1108 10/31/12 1428   11/01/12 1000   levofloxacin (LEVAQUIN) tablet 750 mg  Status:  Discontinued        750 mg Oral Every 48 hours 10/31/12 1428 11/04/12 1124   10/29/12 1045   levofloxacin (LEVAQUIN) IVPB 750 mg  Status:  Discontinued        750 mg 100 mL/hr over 90 Minutes Intravenous Every 24 hours 10/29/12 1034 10/30/12 1108          Assessment/Plan: s/p Procedure(s):  Small bowel obstruction s/p Diagnostic laparoscopy, exploratory laparotomy, lysis of adhesions, 11/07/2012, Axel Filler, MD  Post op ileus-improving  End stage COPD  Protein-calorie malnutrition, severe  Pelvic fluid collection-drained 11/20/12; culture pending Cont FL today.  ON TNA   LOS: 25 days    Kala Gassmann T 11/23/2012

## 2012-11-23 NOTE — Progress Notes (Addendum)
16 Days Post-Op  Subjective: Pt doing fairly well; no new c/o  Objective: Vital signs in last 24 hours: Temp:  [97.9 F (36.6 C)-98.8 F (37.1 C)] 98.8 F (37.1 C) (01/18 1348) Pulse Rate:  [107-111] 107  (01/18 1348) Resp:  [16-18] 18  (01/18 1348) BP: (120-152)/(64-66) 120/66 mmHg (01/18 1348) SpO2:  [95 %-100 %] 100 % (01/18 1348) Last BM Date: 11/21/12  Intake/Output from previous day: 01/17 0701 - 01/18 0700 In: -  Out: 1400 [Urine:1400] Intake/Output this shift: Total I/O In: 120 [P.O.:120] Out: 300 [Urine:300]  Left transgluteal drain intact, insertion site ok, mildly tender, output minimal bloody fluid ; drain flushed with 5 cc's sterile NS with little return, cx's neg  Lab Results:   [facility] 11/23/12 0525  WBC 10.5  HGB 7.6*  HCT 24.6*  PLT 474*   BMET  Basename 11/22/12 0510 11/21/12 0410  NA 132* 132*  K 4.6 4.5  CL 94* 92*  CO2 37* 40*  GLUCOSE 157* 160*  BUN 20 23  CREATININE 0.38* 0.40*  CALCIUM 9.2 9.1   PT/INR No results found for this basename: LABPROT:2,INR:2 in the last 72 hours ABG No results found for this basename: PHART:2,PCO2:2,PO2:2,HCO3:2 in the last 72 hours Results for orders placed during the hospital encounter of 10/29/12  SURGICAL PCR SCREEN     Status: Normal   Collection Time   11/07/12  9:06 AM      Component Value Range Status Comment   MRSA, PCR NEGATIVE  NEGATIVE Final    Staphylococcus aureus NEGATIVE  NEGATIVE Final   URINE CULTURE     Status: Normal   Collection Time   11/08/12 12:20 PM      Component Value Range Status Comment   Specimen Description URINE, CATHETERIZED   Final    Special Requests NONE   Final    Culture  Setup Time 11/08/2012 22:04   Final    Colony Count NO GROWTH   Final    Culture NO GROWTH   Final    Report Status 11/09/2012 FINAL   Final   CULTURE, ROUTINE-ABSCESS     Status: Normal   Collection Time   11/20/12  1:24 PM      Component Value Range Status Comment   Specimen Description  PERITONEAL CAVITY   Final    Special Requests NONE   Final    Gram Stain     Final    Value: RARE WBC PRESENT, PREDOMINANTLY PMN     FEW SQUAMOUS EPITHELIAL CELLS PRESENT     FEW GRAM POSITIVE COCCI IN PAIRS   Culture NO GROWTH 3 DAYS   Final    Report Status 11/23/2012 FINAL   Final     Studies/Results: No results found.  Anti-infectives: Anti-infectives     Start     Dose/Rate Route Frequency Ordered Stop   11/09/12 0000   vancomycin (VANCOCIN) 750 mg in sodium chloride 0.9 % 150 mL IVPB  Status:  Discontinued        750 mg 150 mL/hr over 60 Minutes Intravenous Every 12 hours 11/08/12 1023 11/09/12 1152   11/08/12 1200  piperacillin-tazobactam (ZOSYN) IVPB 3.375 g       3.375 g 12.5 mL/hr over 240 Minutes Intravenous Every 8 hours 11/08/12 1023 11/15/12 0730   11/08/12 1100   vancomycin (VANCOCIN) IVPB 1000 mg/200 mL premix        1,000 mg 200 mL/hr over 60 Minutes Intravenous  Once 11/08/12 1023 11/08/12 1322  11/04/12 1200   levofloxacin (LEVAQUIN) IVPB 500 mg  Status:  Discontinued        500 mg 100 mL/hr over 60 Minutes Intravenous Daily 11/04/12 1124 11/05/12 1436   11/01/12 1615   fluconazole (DIFLUCAN) tablet 200 mg        200 mg Oral  Once 11/01/12 1607 11/01/12 1636   11/01/12 1000   levofloxacin (LEVAQUIN) IVPB 750 mg  Status:  Discontinued        750 mg 100 mL/hr over 90 Minutes Intravenous Every 48 hours 10/30/12 1108 10/31/12 1428   11/01/12 1000   levofloxacin (LEVAQUIN) tablet 750 mg  Status:  Discontinued        750 mg Oral Every 48 hours 10/31/12 1428 11/04/12 1124   10/29/12 1045   levofloxacin (LEVAQUIN) IVPB 750 mg  Status:  Discontinued        750 mg 100 mL/hr over 90 Minutes Intravenous Every 24 hours 10/29/12 1034 10/30/12 1108          Assessment/Plan: s/p pelvic cul de sac fluid coll drainage 1/14; rec f/u CT early next week to assess adequacy of drainage and possible drain removal, esp if output remains minimal.   LOS: 25 days     Kaeley Vinje,D [facility] 11/23/2012

## 2012-11-23 NOTE — Progress Notes (Signed)
PARENTERAL NUTRITION CONSULT NOTE   Pharmacy Consult for TNA Indication: SBO, post-op ileus  Allergies  Allergen Reactions  . Oxycodone-Acetaminophen     REACTION: headaches,nausea   Patient Measurements: Height: 5\' 5"  (165.1 cm) Weight: 114 lb 6.7 oz (51.9 kg) IBW/kg (Calculated) : 57  Usual Weight: 43 kg  Vital Signs: Temp: 97.9 F (36.6 C) (01/18 0544) Temp src: Oral (01/18 0544) BP: 152/64 mmHg (01/18 0544) Pulse Rate: 111  (01/18 0544) Intake/Output from previous day: 01/17 0701 - 01/18 0700 In: -  Out: 1400 [Urine:1400] Intake/Output from this shift:    Labs:  [facility] 11/23/12 0525 11/20/12 1105  WBC 10.5 10.6*  HGB 7.6* 7.9*  HCT 24.6* 25.1*  PLT 474* 455*  APTT -- 46*  INR -- 0.89    Basename 11/22/12 0510 11/21/12 0410  NA 132* 132*  K 4.6 4.5  CL 94* 92*  CO2 37* 40*  GLUCOSE 157* 160*  BUN 20 23  CREATININE 0.38* 0.40*  LABCREA -- --  CREAT24HRUR -- --  CALCIUM 9.2 9.1  MG -- 1.8  PHOS -- 2.6  PROT -- 4.8*  ALBUMIN -- 1.7*  AST -- 17  ALT -- 17  ALKPHOS -- 126*  BILITOT -- 0.2*  BILIDIR -- --  IBILI -- --  PREALBUMIN -- --  TRIG -- --  CHOLHDL -- --  CHOL -- --  Corrected Ca=11 Estimated Creatinine Clearance: 52.8 ml/min (by C-G formula based on Cr of 0.38).   Basename 11/23/12 0546 11/22/12 2343 11/22/12 1810  GLUCAP 138* 153* 155*   Medications:  Scheduled:     . albuterol  2.5 mg Nebulization Q6H  . antiseptic oral rinse  15 mL Mouth Rinse QID  . bisacodyl  10 mg Rectal Daily  . budesonide  0.25 mg Nebulization QID  . chlorhexidine  15 mL Mouth Rinse BID  . feeding supplement  237 mL Oral BID BM  . feeding supplement  1 Container Oral TID BM  . heparin  5,000 Units Subcutaneous Q8H  . insulin aspart  0-9 Units Subcutaneous Q6H  . insulin glargine  10 Units Subcutaneous Daily  . lip balm  1 application Topical BID  . pantoprazole (PROTONIX) IV  40 mg Intravenous Daily  . sodium chloride  10-40 mL Intracatheter Q12H    . tiotropium  18 mcg Inhalation Daily   Infusions:     . dextrose 5 % and 0.45 % NaCl with KCl 20 mEq/L 30 mL/hr at 11/22/12 2115  . TPN (CLINIMIX) +/- additives 70 mL/hr at 11/22/12 1720   And  . fat emulsion 250 mL (11/22/12 1720)  . [EXPIRED] TPN (CLINIMIX) +/- additives 70 mL/hr at 11/21/12 1710   Insulin Requirements in the past 24 hours:  CBG 138-165 Novolog Sensitive SSI -  7 units in past 24 hours Lantus 10 units every AM  Current Nutrition:  FL diet - no intake recorded Ensure pudding TID + Ensure Complete BID Clinimix-E 5/20 @ 70 ml/hr Lipids 20% @ 10 ml/hr MWF ONLY    Fluids: MIVF: D5 1/2 NS with 20 mEQ KCl @ 30 ml/hr  Assessment 71 yoF admitted 10/29/12 with COPD exacerbation. She developed progressive abdominal distention with KUB and CT abd c/w SBO. Underwent exploratory laparotomy, lysis of adhesions 11/07/12 but had prolonged postoperative ileus.  TNA started 11/11/12.   Electrolytes (1/17):  Na 132 (cannot adjust in TNA); Cl slightly low, CO2 slightly elevated - c/w COPD; corrected Ca slightly high but total Ca WNL.  CBG's: elevated >  goal of 150 for a few readings but overall acceptable - continue current SSI and Lantus   Liver: AlkPhos elevated slightly 1/16  Renal: SCr stable; CrCl 52 ml/min   Prealbumin improving: 6.9 (1/7), 10.4 (1/13)  TGs WNL 1/13  Nutritional Goals: per RD Kcal: 1500-1750 Kcal/day  Protein: 70-90g/day  Fluid: 1.5-1.7L  Clinimix E 5/20 at goal rate of 70 ml/hr delivers 84 gm protein/d, avg 1684 kcal/day.  Plan:   Change to Clinimix-E 5/15 at goal rate 70 ml/hr - this should help lower CBGs and will decrease calories (1398 average) to avoid overfeeding. Will still provide 84gm protein.  Lipids, MVI and trace elements MWF only due to national back order.  Change CBGs to ACHS, Novolog sensitive-scale coverage, continue with Lantus 10 units q AM  TNA lab panel Mon/Thurs  BMET in am  Follow tolerance and advancement of  diet.  Await orders to begin TNA wean when appropriate.  Rollene Fare PharmD, BCPS Pager: [phone] 11/23/2012 7:45 AM

## 2012-11-24 LAB — BASIC METABOLIC PANEL
CO2: 40 mEq/L (ref 19–32)
Chloride: 93 mEq/L — ABNORMAL LOW (ref 96–112)
Creatinine, Ser: 0.42 mg/dL — ABNORMAL LOW (ref 0.50–1.10)
GFR calc Af Amer: >90 mL/min (ref >90)
Potassium: 5 mEq/L (ref 3.5–5.1)

## 2012-11-24 LAB — GLUCOSE, CAPILLARY: Glucose-Capillary: 90 mg/dL (ref 70–99)

## 2012-11-24 MED ORDER — DEXTROSE-NACL 5-0.45 % IV SOLN
INTRAVENOUS | Status: DC
  Administered 2012-11-24: 12:00:00 via INTRAVENOUS

## 2012-11-24 MED ORDER — CLINIMIX E/DEXTROSE (5/15) 5 % IV SOLN
INTRAVENOUS | Status: DC
  Filled 2012-11-24: qty 2000

## 2012-11-24 MED ORDER — CLINIMIX E/DEXTROSE (5/15) 5 % IV SOLN
INTRAVENOUS | Status: AC
  Administered 2012-11-24: 18:00:00 via INTRAVENOUS
  Filled 2012-11-24: qty 2000

## 2012-11-24 MED ORDER — HYDROCOD POLST-CHLORPHEN POLST 10-8 MG/5ML PO LQCR
5.0000 mL | Freq: Two times a day (BID) | ORAL | Status: DC | PRN
  Administered 2012-11-24 – 2012-11-25 (×2): 5 mL via ORAL
  Filled 2012-11-24 (×2): qty 5

## 2012-11-24 NOTE — Progress Notes (Signed)
CRITICAL VALUE ALERT  Critical value received:  CO2 40  Date of notification:  11/24/2012  Time of notification:  0600  Critical value read back:yes  Nurse who received alert:  B. Mardel Grudzien  MD notified (1st page):  Roxan Hockey  Time of first page:  [phone]  MD notified (2nd page):  Time of second page:  Responding MD:  Roxan Hockey  Time MD responded:  [phone]

## 2012-11-24 NOTE — Progress Notes (Signed)
Patient ID: KESHONNA VALVO, female   DOB: 24-Mar-1941, 72 y.o.   MRN: [id number] 17 Days Post-Op  Subjective: Doesn't feel quite as well as yesterday but nothing specific. Some nausea relieved with meds. No vomiting. Passing a lot of gas but no bowel movements.  Objective: Vital signs in last 24 hours: Temp:  [98.3 F (36.8 C)-99.1 F (37.3 C)] 98.3 F (36.8 C) (01/19 0541) Pulse Rate:  [107-118] 118  (01/19 0541) Resp:  [18-20] 20  (01/19 0541) BP: (105-125)/(63-85) 105/85 mmHg (01/19 0541) SpO2:  [90 %-100 %] 92 % (01/19 0936) Last BM Date: 11/21/12  Intake/Output from previous day: 01/18 0701 - 01/19 0700 In: 1710 [P.O.:960; I.V.:190; TPN:560] Out: 1060 [Urine:1050; Emesis/NG output:10] Intake/Output this shift:    General appearance: alert, cooperative and no distress GI: somewhat distended but soft and nontender. Incision/Wound: clean and dry without evidence of infection  Lab Results:   [facility] 11/23/12 0525  WBC 10.5  HGB 7.6*  HCT 24.6*  PLT 474*   BMET  Basename 11/24/12 0400 11/22/12 0510  NA 134* 132*  K 5.0 4.6  CL 93* 94*  CO2 40* 37*  GLUCOSE 118* 157*  BUN 20 20  CREATININE 0.42* 0.38*  CALCIUM 9.5 9.2     Studies/Results: No results found.  Anti-infectives: Anti-infectives     Start     Dose/Rate Route Frequency Ordered Stop   11/09/12 0000   vancomycin (VANCOCIN) 750 mg in sodium chloride 0.9 % 150 mL IVPB  Status:  Discontinued        750 mg 150 mL/hr over 60 Minutes Intravenous Every 12 hours 11/08/12 1023 11/09/12 1152   11/08/12 1200  piperacillin-tazobactam (ZOSYN) IVPB 3.375 g       3.375 g 12.5 mL/hr over 240 Minutes Intravenous Every 8 hours 11/08/12 1023 11/15/12 0730   11/08/12 1100   vancomycin (VANCOCIN) IVPB 1000 mg/200 mL premix        1,000 mg 200 mL/hr over 60 Minutes Intravenous  Once 11/08/12 1023 11/08/12 1322   11/04/12 1200   levofloxacin (LEVAQUIN) IVPB 500 mg  Status:  Discontinued        500 mg 100 mL/hr  over 60 Minutes Intravenous Daily 11/04/12 1124 11/05/12 1436   11/01/12 1615   fluconazole (DIFLUCAN) tablet 200 mg        200 mg Oral  Once 11/01/12 1607 11/01/12 1636   11/01/12 1000   levofloxacin (LEVAQUIN) IVPB 750 mg  Status:  Discontinued        750 mg 100 mL/hr over 90 Minutes Intravenous Every 48 hours 10/30/12 1108 10/31/12 1428   11/01/12 1000   levofloxacin (LEVAQUIN) tablet 750 mg  Status:  Discontinued        750 mg Oral Every 48 hours 10/31/12 1428 11/04/12 1124   10/29/12 1045   levofloxacin (LEVAQUIN) IVPB 750 mg  Status:  Discontinued        750 mg 100 mL/hr over 90 Minutes Intravenous Every 24 hours 10/29/12 1034 10/30/12 1108          Assessment/Plan: s/p Procedure(s): LAPAROSCOPY DIAGNOSTIC EXPLORATORY LAPAROTOMY LAPAROSCOPIC LYSIS OF ADHESIONS LYSIS OF ADHESION Percutaneous drainage of abdominal fluid collection  Stable. Still appears to have somewhat of an ileus but passing gas. We'll leave on full liquids for now. She is on TNA. Slow drop in hemoglobin. No apparent bleeding. Check CBC tomorrow. Metabolic alkalosis. Likely secondary to severe COPD. May need TNA adjustment, pharmacy following Continue physical therapy. CT scan early this week  to reevaluate fluid collection.   LOS: 26 days    Almin Livingstone T 11/24/2012

## 2012-11-24 NOTE — Progress Notes (Signed)
Pt profile: 72 y/o wf admitted by [facility] 12/24 with dx of AECOP and treated accordingly with improvement in respiratory symptoms. She developed progressive abdominal distention with KUB and CT abd c/w SBO. Underwent exploratory laparotomy, lysis of adhesions 1/02. Returned to ICU on vent. PCCM asked to assist with vent/ICU mgmt   Lines, Tubes, etc: ETT 1/02 >> 1/03 R IJ CVL 1/02 >> out  Microbiology: MRSA PCR 1/02 >> NEG 1/3 BC x 2 >>not collected 1/3 UC >> NEG  Antibiotics:  vanco (empiric, fever) 1/3 >> 1/04 Zosyn (empiric, fever) 1/3 >> 1/10    Studies/Events: 12/31 CT abd: Small bowel obstruction with dilated small bowel to the level of the distal ileum in the region of the pelvis where there is abrupt change of caliber which may be related to adhesions 1/2 Hypotension post op >pressor support  1/3 low grade fever/elevated WBC >pan cx  1/9 - ileus continues, n/v with NGT clamped, stable but tenuous respiratory status   Consults:  CCS 12/30  Best Practice: DVT: SQ hep SUP: PPI Nutrition: none Glycemic control: SSI Sedation/analgesia: PRN fentanyl   [name]: she remains stable from a pulmonary standpoint.  No increased wob.  NG tube out, and taking liquids. C/o cough that is dry, and feels is postnasal drip.  Leads to increased sob.   Objective:  Filed Vitals:   11/23/12 1348 11/23/12 2047 11/24/12 0541 11/24/12 0936  BP: 120/66 125/63 105/85   Pulse: 107 108 118   Temp: 98.8 F (37.1 C) 99.1 F (37.3 C) 98.3 F (36.8 C)   TempSrc: Oral Oral Oral   Resp: 18 20 20    Height:      Weight:      SpO2: 100% 98% 90% 92%   Gen: wd female, nad.   HEENT: NG out, OP clear Neck: no JVD or LN Chest: decreased bs, no wheezing Cardiac: RRR, no MRG Abd: decreased bs, but present.  Ext:no edema or cyanosis Neuro: a/o x 3 , moves all 4.   BMET  Lab 11/24/12 0400 11/22/12 0510 11/21/12 0410 11/20/12 0520 11/19/12 0610 11/18/12 0510  NA 134* 132* 132* 136 135 --  K 5.0 4.6  -- -- -- --  CL 93* 94* 92* 95* 93* --  CO2 40* 37* 40* 38* 39* --  GLUCOSE 118* 157* 160* 135* 170* --  BUN 20 20 23 20 20  --  CREATININE 0.42* 0.38* 0.40* 0.41* 0.38* --  CALCIUM 9.5 9.2 9.1 9.5 9.6 --  MG -- -- 1.8 1.9 -- 2.0  PHOS -- -- 2.6 2.9 -- 3.3   CBC  Lab 11/23/12 0525 11/20/12 1105 11/18/12 0510  HGB 7.6* 7.9* 8.2*  HCT 24.6* 25.1* 26.3*  WBC 10.5 10.6* 12.1*  PLT 474* 455* 440*      IMPRESSION:  SBO (small bowel obstruction) - Status post laparotomy per CCS.  NG out.  Plan: -rec's per Surgery- -TPN per pharmacy, but hopefully can d/c soon.  -trend BMP prn   Post-operative respiratory failure - Resolved.  Emphysema, severe - No acute bronchospasm or increased wob at rest.   Plan: -aggressive pulmonary hygiene IS/Flutter -pulmicort, BD's -tussionex prn for cough since it leads to severe sob, but need to be careful with impact on gut motility    Anemia - in setting of chronic disease / acute critical illness.  No indication for transfusion, but if drops any further, will need to consider

## 2012-11-24 NOTE — Progress Notes (Signed)
PARENTERAL NUTRITION CONSULT NOTE   Pharmacy Consult for TNA Indication: SBO, post-op ileus  Allergies  Allergen Reactions  . Oxycodone-Acetaminophen     REACTION: headaches,nausea   Patient Measurements: Height: 5\' 5"  (165.1 cm) Weight: 114 lb 6.7 oz (51.9 kg) IBW/kg (Calculated) : 57  Usual Weight: 43 kg  Vital Signs: Temp: 98.3 F (36.8 C) (01/19 0541) Temp src: Oral (01/19 0541) BP: 105/85 mmHg (01/19 0541) Pulse Rate: 118  (01/19 0541) Intake/Output from previous day: 01/18 0701 - 01/19 0700 In: 1710 [P.O.:960; I.V.:190; TPN:560] Out: 1060 [Urine:1050; Emesis/NG output:10] Intake/Output from this shift: Total I/O In: 480 [P.O.:480] Out: 450 [Urine:450]  Labs:  [facility] 11/23/12 0525  WBC 10.5  HGB 7.6*  HCT 24.6*  PLT 474*  APTT --  INR --    Basename 11/24/12 0400 11/22/12 0510  NA 134* 132*  K 5.0 4.6  CL 93* 94*  CO2 40* 37*  GLUCOSE 118* 157*  BUN 20 20  CREATININE 0.42* 0.38*  LABCREA -- --  CREAT24HRUR -- --  CALCIUM 9.5 9.2  MG -- --  PHOS -- --  PROT -- --  ALBUMIN -- --  AST -- --  ALT -- --  ALKPHOS -- --  BILITOT -- --  BILIDIR -- --  IBILI -- --  PREALBUMIN -- --  TRIG -- --  CHOLHDL -- --  CHOL -- --  Corrected Ca=11 Estimated Creatinine Clearance: 52.8 ml/min (by C-G formula based on Cr of 0.42).   Basename 11/24/12 0812 11/23/12 2150 11/23/12 1812  GLUCAP 143* 109* 121*   Medications:  Scheduled:     . albuterol  2.5 mg Nebulization Q6H  . antiseptic oral rinse  15 mL Mouth Rinse QID  . bisacodyl  10 mg Rectal Daily  . budesonide  0.25 mg Nebulization QID  . chlorhexidine  15 mL Mouth Rinse BID  . feeding supplement  237 mL Oral BID BM  . feeding supplement  1 Container Oral TID BM  . heparin  5,000 Units Subcutaneous Q8H  . insulin aspart  0-5 Units Subcutaneous QHS  . insulin aspart  0-9 Units Subcutaneous TID WC  . insulin glargine  10 Units Subcutaneous Daily  . lip balm  1 application Topical BID  .  pantoprazole (PROTONIX) IV  40 mg Intravenous Daily  . sodium chloride  10-40 mL Intracatheter Q12H  . tiotropium  18 mcg Inhalation Daily   Infusions:     . dextrose 5 % and 0.45 % NaCl with KCl 20 mEq/L 30 mL/hr at 11/22/12 2115  . [EXPIRED] TPN (CLINIMIX) +/- additives 70 mL/hr at 11/22/12 1720   And  . [EXPIRED] fat emulsion 250 mL (11/22/12 1720)  . TPN (CLINIMIX) +/- additives 70 mL/hr at 11/23/12 1715   Insulin Requirements in the past 24 hours:  CBG range 109-143 Novolog Sensitive SSI -  3 units in past 24 hours Lantus 10 units every AM  Current Nutrition:  FL diet  Ensure pudding TID + Ensure Complete BID Clinimix-E 5/15 @ 70 ml/hr Lipids 20% @ 10 ml/hr MWF ONLY    Fluids: MIVF: D5 1/2 NS with 20 mEQ KCl @ 30 ml/hr  Assessment 71 yoF admitted 10/29/12 with COPD exacerbation. She developed progressive abdominal distention with KUB and CT abd c/w SBO. Underwent exploratory laparotomy, lysis of adhesions 11/07/12 but had prolonged postoperative ileus.  TNA started 11/11/12.   Electrolytes: K high/normal (cannot adjust in TNA); Cl slightly low, CO2 slightly elevated - c/w COPD; corrected Ca slightly  high but total Ca WNL.  CBG's: improved with lower dextrose concentration in TNA   Liver: AlkPhos elevated slightly 1/16  Renal: SCr stable; CrCl 52 ml/min   Prealbumin improving: 6.9 (1/7), 10.4 (1/13)  TGs WNL 1/13  -MD would like to continue TNA today and keep on full liquids as appears to still have ileus; +flatus but no BM. -Noted MD concern for metabolic alkalosis. Since we are unable to adjust content of pre-mixed formulation, only option would be to decrease TNA rate to reduce carbohydrate load. The dextrose concentration was changed from 20% to 15% at 6pm yesterday, so this may help as well.  Nutritional Goals:  Kcal: 1500-1750 Kcal/day  Protein: 70-90g/day  Fluid: 1.5-1.7L  Clinimix E 5/15 at goal rate of 70 ml/hr delivers 84 gm protein/d, 1672kcal MWF,  1193kcal TTSS (avg 1398 kcal/day).  Plan:   Continue Clinimix-E 5/15 but decrease rate to 60 ml/hr  Remove KCL from IVF  Lipids, MVI and trace elements MWF only due to national back order.  Continue CBGs ACHS, Novolog sensitive-scale coverage, continue with Lantus 10 units q AM  TNA lab panel Mon/Thurs  Follow tolerance and advancement of diet.  Await orders to begin TNA wean when appropriate.  Loralee Pacas, PharmD, BCPS Pager: [phone] 11/24/2012 11:16 AM

## 2012-11-25 LAB — CBC
HCT: 22.3 % — ABNORMAL LOW (ref 36.0–46.0)
MCH: 30.9 pg (ref 26.0–34.0)
MCV: 100 fL (ref 78.0–100.0)
Platelets: 405 10*3/uL — ABNORMAL HIGH (ref 150–400)
RDW: 13.9 % (ref 11.5–15.5)

## 2012-11-25 LAB — DIFFERENTIAL
Basophils Absolute: 0.1 10*3/uL (ref 0.0–0.1)
Lymphs Abs: 1.1 10*3/uL (ref 0.7–4.0)
Monocytes Absolute: 1.5 10*3/uL — ABNORMAL HIGH (ref 0.1–1.0)
Neutro Abs: 4.5 10*3/uL (ref 1.7–7.7)

## 2012-11-25 LAB — COMPREHENSIVE METABOLIC PANEL
ALT: 31 U/L (ref 0–35)
Alkaline Phosphatase: 195 U/L — ABNORMAL HIGH (ref 39–117)
CO2: 42 mEq/L (ref 19–32)
Calcium: 9.3 mg/dL (ref 8.4–10.5)
Chloride: 93 mEq/L — ABNORMAL LOW (ref 96–112)
GFR calc Af Amer: >90 mL/min (ref >90)
GFR calc non Af Amer: >90 mL/min (ref >90)
Glucose, Bld: 129 mg/dL — ABNORMAL HIGH (ref 70–99)
Potassium: 4.4 mEq/L (ref 3.5–5.1)
Sodium: 135 mEq/L (ref 135–145)
Total Bilirubin: 0.2 mg/dL — ABNORMAL LOW (ref 0.3–1.2)

## 2012-11-25 LAB — GLUCOSE, CAPILLARY
Glucose-Capillary: 136 mg/dL — ABNORMAL HIGH (ref 70–99)
Glucose-Capillary: 124 mg/dL — ABNORMAL HIGH (ref 70–99)
Glucose-Capillary: 166 mg/dL — ABNORMAL HIGH (ref 70–99)

## 2012-11-25 LAB — PREALBUMIN: Prealbumin: 9.9 mg/dL — ABNORMAL LOW (ref 17.0–34.0)

## 2012-11-25 LAB — PREPARE RBC (CROSSMATCH)

## 2012-11-25 MED ORDER — FAT EMULSION 20 % IV EMUL
250.0000 mL | INTRAVENOUS | Status: DC
  Administered 2012-11-25: 250 mL via INTRAVENOUS
  Filled 2012-11-25: qty 250

## 2012-11-25 MED ORDER — FUROSEMIDE 10 MG/ML IJ SOLN
10.0000 mg | Freq: Once | INTRAMUSCULAR | Status: AC
  Administered 2012-11-25: 10 mg via INTRAVENOUS
  Filled 2012-11-25: qty 1

## 2012-11-25 MED ORDER — BENZONATATE 100 MG PO CAPS
200.0000 mg | ORAL_CAPSULE | Freq: Three times a day (TID) | ORAL | Status: DC
  Administered 2012-11-25 – 2012-11-29 (×13): 200 mg via ORAL
  Filled 2012-11-25 (×15): qty 2

## 2012-11-25 MED ORDER — PANTOPRAZOLE SODIUM 40 MG PO TBEC
40.0000 mg | DELAYED_RELEASE_TABLET | Freq: Every day | ORAL | Status: DC
  Administered 2012-11-25 – 2012-11-29 (×5): 40 mg via ORAL
  Filled 2012-11-25 (×5): qty 1

## 2012-11-25 MED ORDER — TRACE MINERALS CR-CU-F-FE-I-MN-MO-SE-ZN IV SOLN
INTRAVENOUS | Status: DC
  Administered 2012-11-25: 18:00:00 via INTRAVENOUS
  Filled 2012-11-25: qty 1000

## 2012-11-25 MED ORDER — FUROSEMIDE 10 MG/ML IJ SOLN: 20.0000 mg | Freq: Once | INTRAMUSCULAR | Status: DC

## 2012-11-25 NOTE — Progress Notes (Addendum)
PARENTERAL NUTRITION CONSULT NOTE   Pharmacy Consult for TNA Indication: SBO, post-op ileus  Allergies  Allergen Reactions  . Oxycodone-Acetaminophen     REACTION: headaches,nausea   Patient Measurements: Height: 5\' 5"  (165.1 cm) Weight: 114 lb 6.7 oz (51.9 kg) IBW/kg (Calculated) : 57  Usual Weight: 43 kg  Vital Signs: Temp: 98.5 F (36.9 C) (01/20 0517) Temp src: Oral (01/20 0517) BP: 143/60 mmHg (01/20 0517) Pulse Rate: 104  (01/20 0517) Intake/Output from previous day: 01/19 0701 - 01/20 0700 In: 3406.2 [P.O.:1320; I.V.:528.5; TPN:1557.7] Out: 1000 [Urine:1000] Intake/Output from this shift:    Labs:  [facility] 11/25/12 0650 11/23/12 0525  WBC 7.5 10.5  HGB 6.9* 7.6*  HCT 22.3* 24.6*  PLT 405* 474*  APTT -- --  INR -- --    Basename 11/25/12 0650 11/24/12 0400  NA 135 134*  K 4.4 5.0  CL 93* 93*  CO2 42* 40*  GLUCOSE 129* 118*  BUN 20 20  CREATININE 0.44* 0.42*  LABCREA -- --  CREAT24HRUR -- --  CALCIUM 9.3 9.5  MG 1.8 --  PHOS 3.7 --  PROT 4.9* --  ALBUMIN 1.7* --  AST 29 --  ALT 31 --  ALKPHOS 195* --  BILITOT 0.2* --  BILIDIR -- --  IBILI -- --  PREALBUMIN -- --  TRIG -- --  CHOLHDL -- --  CHOL -- --  Corrected Ca=11 Estimated Creatinine Clearance: 52.8 ml/min (by C-G formula based on Cr of 0.44).   Basename 11/25/12 0738 11/24/12 2049 11/24/12 1730  GLUCAP 136* 90 128*   Medications:  Scheduled:     . albuterol  2.5 mg Nebulization Q6H  . antiseptic oral rinse  15 mL Mouth Rinse QID  . bisacodyl  10 mg Rectal Daily  . budesonide  0.25 mg Nebulization QID  . chlorhexidine  15 mL Mouth Rinse BID  . feeding supplement  237 mL Oral BID BM  . feeding supplement  1 Container Oral TID BM  . heparin  5,000 Units Subcutaneous Q8H  . insulin aspart  0-5 Units Subcutaneous QHS  . insulin aspart  0-9 Units Subcutaneous TID WC  . insulin glargine  10 Units Subcutaneous Daily  . lip balm  1 application Topical BID  . pantoprazole  (PROTONIX) IV  40 mg Intravenous Daily  . sodium chloride  10-40 mL Intracatheter Q12H  . tiotropium  18 mcg Inhalation Daily   Infusions:     . dextrose 5 % and 0.45% NaCl 30 mL/hr at 11/24/12 1223  . [EXPIRED] TPN (CLINIMIX) +/- additives 70 mL/hr at 11/23/12 1715  . TPN (CLINIMIX) +/- additives 60 mL/hr at 11/24/12 1758  . [DISCONTINUED] dextrose 5 % and 0.45 % NaCl with KCl 20 mEq/L 30 mL/hr at 11/22/12 2115  . [DISCONTINUED] TPN (CLINIMIX) +/- additives     Insulin Requirements in the past 24 hours:  CBG range 90-143 Novolog Sensitive SSI -  3 units in past 24 hours Lantus 10 units every AM  Current Nutrition:  Tolerated FL diet well and regular diet has been ordered to start today 1/20 Ensure pudding TID + Ensure Complete BID Clinimix-E 5/15 @ 60 ml/hr Lipids 20% @ 10 ml/hr MWF ONLY    Fluids: MIVF: D5 1/2 NS with 20 mEQ KCl @ 30 ml/hr  Assessment 71 yoF admitted 10/29/12 with COPD exacerbation. She developed progressive abdominal distention with KUB and CT abd c/w SBO. Underwent exploratory laparotomy, lysis of adhesions 11/07/12 but had prolonged postoperative ileus.  TNA started  11/11/12.   Electrolytes: Cl slightly low, CO2 slightly elevated - c/w COPD; corrected Ca slightly high but total Ca WNL.  CBG's: at goal of < 150 with lower dextrose concentration in TNA   Liver: AlkPhos elevated and rising some  Renal: SCr stable; CrCl 52 ml/min   Prealbumin improving: 6.9 (1/7), 10.4 (1/13)  TGs WNL 1/13  - as of 1/19, MD would like to continue TNA today and keep on full liquids as appears to still have ileus; +flatus but no BM. -Noted MD concern for metabolic alkalosis. Since we are unable to adjust content of pre-mixed formulation, only option would be to decrease TNA rate to reduce carbohydrate load. The dextrose concentration was changed from 20% to 15% at 6pm yesterday, so this may help as well.  Nutritional Goals:  Kcal: 1500-1750 Kcal/day  Protein: 70-90g/day    Fluid: 1.5-1.7L  Clinimix E 5/15 at goal rate of 70 ml/hr delivers 84 gm protein/d, 1672kcal MWF, 1193kcal TTSS (avg 1398 kcal/day).  Plan:   With improving diet, continue Clinimix-E 5/15 but decrease rate to 40 ml/hr with hopes of stopping after today/tomorrow  Lipids, MVI and trace elements MWF only due to national back order.  Continue CBGs ACHS, Novolog sensitive-scale coverage. Will d/c Lantus with decreasing TNA rate and d/c soon  TNA lab panel Mon/Thurs  Change PPI to PO with toleration of diet now  Follow tolerance and advancement of diet.     Hessie Knows, PharmD, BCPS Pager [phone] 11/25/2012 8:15 AM

## 2012-11-25 NOTE — Progress Notes (Signed)
Flushed pt's drain with 5mL NS. Pt tolerated well. Drain put out 10mL.

## 2012-11-25 NOTE — Progress Notes (Addendum)
CRITICAL VALUE ALERT  Critical value received:  6.2 hgb  Date of notification:  11/25/12   Time of notification:  0734  Critical value read back:yes  Nurse who received alert:  Sammuel Bailiff, RN  MD notified (1st page):  Dr. Dwain Sarna  Time of first page:  [phone]  MD notified (2nd page):  Time of second page:  Responding MD:  Dr. Dwain Sarna  Time MD responded:  [phone]

## 2012-11-25 NOTE — Progress Notes (Signed)
Chart reviewed for continued LOS. Per pt considering SNF placement but also considering home with [facility] services with 24 hour caretaker. Will continue to follow.  [name] [phone]

## 2012-11-25 NOTE — Progress Notes (Signed)
Pt profile: 72 y/o wf admitted by [facility] 12/24 with dx of AECOP and treated accordingly with improvement in respiratory symptoms. She developed progressive abdominal distention with KUB and CT abd c/w SBO. Underwent exploratory laparotomy, lysis of adhesions 1/02. Returned to ICU on vent. PCCM asked to assist with vent/ICU mgmt   Lines, Tubes, etc: ETT 1/02 >> 1/03 R IJ CVL 1/02 >> out  Microbiology: MRSA PCR 1/02 >> NEG 1/3 BC x 2 >>not collected 1/3 UC >> NEG  Antibiotics:  vanco (empiric, fever) 1/3 >> 1/04 Zosyn (empiric, fever) 1/3 >> 1/10    Studies/Events: 12/31 CT abd: Small bowel obstruction with dilated small bowel to the level of the distal ileum in the region of the pelvis where there is abrupt change of caliber which may be related to adhesions 1/2 Hypotension post op >pressor support  1/3 low grade fever/elevated WBC >pan cx  1/9 - ileus continues, n/v with NGT clamped, stable but tenuous respiratory status   Consults:  CCS 12/30  Best Practice: DVT: SQ hep SUP: PPI Nutrition: none Glycemic control: SSI Sedation/analgesia: PRN fentanyl   [name]: she remains stable from a pulmonary standpoint.  No increased wob.  NG tube out, and taking liquids. C/o cough that is dry, and feels is postnasal drip.  Leads to increased sob.  Still has breakthru on tussionex.  Objective:  Filed Vitals:   11/24/12 0936 11/24/12 1450 11/24/12 2051 11/25/12 0517  BP:  120/61 119/61 143/60  Pulse:  99 106 104  Temp:  98.5 F (36.9 C) 98.6 F (37 C) 98.5 F (36.9 C)  TempSrc:  Oral Oral Oral  Resp:  18 20 20   Height:      Weight:      SpO2: 92% 100% 100% 95%   Gen: wd female, nad.   HEENT: NG out, OP clear Neck: no JVD or LN Chest: decreased bs, no wheezing or rhonchi Cardiac: RRR, no MRG Abd: decreased bs, but present.  Ext:no edema or cyanosis Neuro: a/o x 3 , moves all 4.   BMET  Lab 11/25/12 0650 11/24/12 0400 11/22/12 0510 11/21/12 0410 11/20/12 0520  NA 135 134*  132* 132* 136  K 4.4 5.0 -- -- --  CL 93* 93* 94* 92* 95*  CO2 42* 40* 37* 40* 38*  GLUCOSE 129* 118* 157* 160* 135*  BUN 20 20 20 23 20   CREATININE 0.44* 0.42* 0.38* 0.40* 0.41*  CALCIUM 9.3 9.5 9.2 9.1 9.5  MG 1.8 -- -- 1.8 1.9  PHOS 3.7 -- -- 2.6 2.9   CBC  Lab 11/25/12 0650 11/23/12 0525 11/20/12 1105  HGB 6.9* 7.6* 7.9*  HCT 22.3* 24.6* 25.1*  WBC 7.5 10.5 10.6*  PLT 405* 474* 455*      IMPRESSION:  SBO (small bowel obstruction) - Status post laparotomy per CCS.  NG out.  Plan: -rec's per Surgery- -serum bicarb up secondary to her severe copd, but level trending up due to acetate in TNA.  Hopefully will go back to baseline of mid 30's once TNA d/ced.  Will also need to be careful with contraction alkalosis with lasix.  Post-operative respiratory failure - Resolved.  Emphysema, severe - No acute bronchospasm or increased wob at rest.   Plan: -aggressive pulmonary hygiene IS/Flutter -pulmicort, BD's -tussionex prn for cough since it leads to severe sob, but need to be careful with impact on gut motility.  Will add tessalon pearls due to breakthru cough.  -check cxr today.     Anemia -  in setting of chronic disease / acute critical illness.  Agree with transfusion today, recheck in am.

## 2012-11-25 NOTE — Progress Notes (Signed)
Clinical Social Worker continuing to follow for discharge planning. Clinical Social Worker followed up with pt at bedside to discuss. Pt  continues to consider SNF placement but also considering home with [facility] services with 24 hour caretaker. Pt provided a number of facilities that she may be interested in and [facility] was only facility of the facilities pt named that are contracted with pt insurance. Clinical Social Worker contacted [employer] and sent updated clinicals. Clinical Social Worker will continue to update information and re-initiate SNF search as pt progresses. Clinical Social Worker to continue to follow.  Jacklynn Lewis, MSW, LCSWA  Clinical Social Work [phone]

## 2012-11-25 NOTE — Progress Notes (Signed)
I think reasonable to consider the blood today as she is below 7.  This is clearly related to chronic illness but has been slowly dwindling down and I think she would benefit from this now.  Will give small dose of lasix around this with edema/blood.  Will advance diet and wean tna. Discussed with patient

## 2012-11-25 NOTE — Progress Notes (Signed)
Patient ID: [name], female   DOB: 14-Jun-1941, 72 y.o.   MRN: [id number] 18 Days Post-Op  Subjective: Coughing is wearing her out, making her SOB worse, getting Tussinex but patient reports she normally takes it more then q 12hrs, Some nausea relieved with meds. No vomiting. Passing a lot of gas but no bowel movements.  Isn't eating very well but very picky about what she eats, very edematous   Objective: Vital signs in last 24 hours: Temp:  [98.5 F (36.9 C)-98.6 F (37 C)] 98.5 F (36.9 C) (01/20 0517) Pulse Rate:  [99-106] 104  (01/20 0517) Resp:  [18-20] 20  (01/20 0517) BP: (119-143)/(60-61) 143/60 mmHg (01/20 0517) SpO2:  [92 %-100 %] 95 % (01/20 0517) Last BM Date: 11/21/12  Intake/Output from previous day: 01/19 0701 - 01/20 0700 In: 3406.2 [P.O.:1320; I.V.:528.5; TPN:1557.7] Out: 1000 [Urine:1000] Intake/Output this shift:    General appearance: alert, cooperative and no distress Resp: decreased breath sounds in bases and sounds wet GI: somewhat distended nontender, +bowel sounds Incision/Wound: C/d/i, mostly healed Ext: arms and legs very edematous with 2+ pitting edema  Lab Results:   Basename 11/25/12 0650 11/23/12 0525  WBC 7.5 10.5  HGB 6.9* 7.6*  HCT 22.3* 24.6*  PLT 405* 474*   BMET  Basename 11/25/12 0650 11/24/12 0400  NA 135 134*  K 4.4 5.0  CL 93* 93*  CO2 42* 40*  GLUCOSE 129* 118*  BUN 20 20  CREATININE 0.44* 0.42*  CALCIUM 9.3 9.5     Studies/Results: No results found.  Anti-infectives: Anti-infectives     Start     Dose/Rate Route Frequency Ordered Stop   11/09/12 0000   vancomycin (VANCOCIN) 750 mg in sodium chloride 0.9 % 150 mL IVPB  Status:  Discontinued        750 mg 150 mL/hr over 60 Minutes Intravenous Every 12 hours 11/08/12 1023 11/09/12 1152   11/08/12 1200  piperacillin-tazobactam (ZOSYN) IVPB 3.375 g       3.375 g 12.5 mL/hr over 240 Minutes Intravenous Every 8 hours 11/08/12 1023 11/15/12 0730   11/08/12 1100    vancomycin (VANCOCIN) IVPB 1000 mg/200 mL premix        1,000 mg 200 mL/hr over 60 Minutes Intravenous  Once 11/08/12 1023 11/08/12 1322   11/04/12 1200   levofloxacin (LEVAQUIN) IVPB 500 mg  Status:  Discontinued        500 mg 100 mL/hr over 60 Minutes Intravenous Daily 11/04/12 1124 11/05/12 1436   11/01/12 1615   fluconazole (DIFLUCAN) tablet 200 mg        200 mg Oral  Once 11/01/12 1607 11/01/12 1636   11/01/12 1000   levofloxacin (LEVAQUIN) IVPB 750 mg  Status:  Discontinued        750 mg 100 mL/hr over 90 Minutes Intravenous Every 48 hours 10/30/12 1108 10/31/12 1428   11/01/12 1000   levofloxacin (LEVAQUIN) tablet 750 mg  Status:  Discontinued        750 mg Oral Every 48 hours 10/31/12 1428 11/04/12 1124   10/29/12 1045   levofloxacin (LEVAQUIN) IVPB 750 mg  Status:  Discontinued        750 mg 100 mL/hr over 90 Minutes Intravenous Every 24 hours 10/29/12 1034 10/30/12 1108          Assessment/Plan: 1. POD#18-exp lap with LOA, 1/15 Percutaneous drainage of abdominal fluid collection: seems to be relatively stable with some persistent ileus but really picky about the food that is  available, She may be able to tolerate a soft diet to try and help stimulate her to eat more, actively passing gas, very edematous, Continue physical therapy. CT scan early this week to reevaluate fluid collection  --allow soft diet today and advance once having BM  --decrease TNA and start to wean  --will need 1 unit PRBC per below.   2. Anemia, Hgb 6.9: likely related to chronic illness, poor PO intact, dilution, given multiple comorbidities she meets criteria for 1 unit PRBC, will definitely need some lasix with this  3. Metabolic alkalosis: Likely secondary to severe COPD. need TNA adjustment, pharmacy following Continue physical therapy. CT scan early this week to reevaluate fluid collection.   LOS: 27 days    Kassem Kibbe 11/25/2012

## 2012-11-25 NOTE — Progress Notes (Addendum)
CRITICAL VALUE ALERT  Critical value received:  CO2 42  Date of notification:  11/25/12  Time of notification:  0734  Critical value read back:yes  Nurse who received alert:  Sammuel Bailiff   MD notified (1st page):  Dr. Shelle Iron  Time of first page:  0735  MD notified (2nd page): Dr. Shelle Iron  Time of second page: [phone]  Responding MD:  [phone]  Time MD responded:  Dr. Shelle Iron

## 2012-11-25 NOTE — Progress Notes (Addendum)
Subjective: Pt ok. Some nausea, but slowly advancing diet  Objective: Physical Exam: BP 128/64  Pulse 95  Temp 98.5 F (36.9 C) (Oral)  Resp 20  Ht 5\' 5"  (1.651 m)  Wt 114 lb 6.7 oz (51.9 kg)  BMI 19.04 kg/m2  SpO2 100% (L)TG drain intact, site clean. Minimally tender. Drain flushes easily Output serosanguinous, scant   Labs: CBC  Basename 11/25/12 0650 11/23/12 0525  WBC 7.5 10.5  HGB 6.9* 7.6*  HCT 22.3* 24.6*  PLT 405* 474*   BMET  Basename 11/25/12 0650 11/24/12 0400  NA 135 134*  K 4.4 5.0  CL 93* 93*  CO2 42* 40*  GLUCOSE 129* 118*  BUN 20 20  CREATININE 0.44* 0.42*  CALCIUM 9.3 9.5   LFT  Basename 11/25/12 0650  PROT 4.9*  ALBUMIN 1.7*  AST 29  ALT 31  ALKPHOS 195*  BILITOT 0.2*  BILIDIR --  IBILI --  LIPASE --   PT/INR No results found for this basename: LABPROT:2,INR:2 in the last 72 hours   Studies/Results: No results found.  Assessment/Plan: s/p pelvic cul de sac fluid coll drainage 1/15. Rec repeat CT since output minimal, possible removal.     LOS: 27 days    Brayton El PA-C 11/25/2012 3:14 PM

## 2012-11-26 ENCOUNTER — Ambulatory Visit (HOSPITAL_COMMUNITY): Payer: Medicare Other

## 2012-11-26 ENCOUNTER — Encounter (HOSPITAL_COMMUNITY): Payer: Medicare Other

## 2012-11-26 LAB — GLUCOSE, CAPILLARY
Glucose-Capillary: 121 mg/dL — ABNORMAL HIGH (ref 70–99)
Glucose-Capillary: 120 mg/dL — ABNORMAL HIGH (ref 70–99)

## 2012-11-26 LAB — CBC
Hemoglobin: 8.8 g/dL — ABNORMAL LOW (ref 12.0–15.0)
MCH: 29.7 pg (ref 26.0–34.0)
MCHC: 31.1 g/dL (ref 30.0–36.0)
MCV: 95.6 fL (ref 78.0–100.0)
RBC: 2.96 MIL/uL — ABNORMAL LOW (ref 3.87–5.11)

## 2012-11-26 LAB — COMPREHENSIVE METABOLIC PANEL
ALT: 33 U/L (ref 0–35)
AST: 26 U/L (ref 0–37)
Albumin: 1.7 g/dL — ABNORMAL LOW (ref 3.5–5.2)
CO2: 41 mEq/L (ref 19–32)
Calcium: 9.3 mg/dL (ref 8.4–10.5)
Chloride: 92 mEq/L — ABNORMAL LOW (ref 96–112)
Creatinine, Ser: 0.44 mg/dL — ABNORMAL LOW (ref 0.50–1.10)
GFR calc non Af Amer: >90 mL/min (ref >90)
Sodium: 136 mEq/L (ref 135–145)
Total Bilirubin: 0.2 mg/dL — ABNORMAL LOW (ref 0.3–1.2)

## 2012-11-26 LAB — TYPE AND SCREEN
Antibody Screen: NEGATIVE
Unit division: 0

## 2012-11-26 MED ORDER — DEXTROSE 10 % IV SOLN
INTRAVENOUS | Status: DC
  Administered 2012-11-26: 09:00:00 via INTRAVENOUS
  Filled 2012-11-26: qty 1000

## 2012-11-26 MED ORDER — IOHEXOL 300 MG/ML  SOLN
100.0000 mL | Freq: Once | INTRAMUSCULAR | Status: AC | PRN
  Administered 2012-11-26: 80 mL via INTRAVENOUS

## 2012-11-26 NOTE — Progress Notes (Signed)
19 Days Post-Op  Subjective: Resting comfortably. Just recently seen by surgical team. No new significant complaints.   Objective: Vital signs in last 24 hours: Temp:  [98 F (36.7 C)-99.1 F (37.3 C)] 98 F (36.7 C) (01/21 0459) Pulse Rate:  [88-405] 88  (01/21 0459) Resp:  [16-20] 18  (01/21 0459) BP: (115-139)/(57-69) 116/58 mmHg (01/21 0459) SpO2:  [97 %-100 %] 98 % (01/21 0810) Last BM Date: 11/24/12  Intake/Output from previous day: 01/20 0701 - 01/21 0700 In: 2601.5 [P.O.:460; I.V.:700; Blood:562.5; TPN:879] Out: 1560 [Urine:1550; Drains:10] Intake/Output this shift:    PE: drain intact with scant bloody drainage- minimal output last few days with negative cultures - for repeat CT later today per surgical note. Insertion site clean and dry.   Lab Results:   Basename 11/26/12 0700 11/25/12 0650  WBC 7.7 7.5  HGB 8.8* 6.9*  HCT 28.3* 22.3*  PLT 384 405*   BMET  Basename 11/26/12 0700 11/25/12 0650  NA 136 135  K 4.3 4.4  CL 92* 93*  CO2 41* 42*  GLUCOSE 143* 129*  BUN 20 20  CREATININE 0.44* 0.44*  CALCIUM 9.3 9.3     Studies/Results:  Percutaneous drain placement 11/20/12.   Anti-infectives: Anti-infectives     Start     Dose/Rate Route Frequency Ordered Stop   11/09/12 0000   vancomycin (VANCOCIN) 750 mg in sodium chloride 0.9 % 150 mL IVPB  Status:  Discontinued        750 mg 150 mL/hr over 60 Minutes Intravenous Every 12 hours 11/08/12 1023 11/09/12 1152   11/08/12 1200   piperacillin-tazobactam (ZOSYN) IVPB 3.375 g        3.375 g 12.5 mL/hr over 240 Minutes Intravenous Every 8 hours 11/08/12 1023 11/15/12 0730   11/08/12 1100   vancomycin (VANCOCIN) IVPB 1000 mg/200 mL premix        1,000 mg 200 mL/hr over 60 Minutes Intravenous  Once 11/08/12 1023 11/08/12 1322   11/04/12 1200   levofloxacin (LEVAQUIN) IVPB 500 mg  Status:  Discontinued        500 mg 100 mL/hr over 60 Minutes Intravenous Daily 11/04/12 1124 11/05/12 1436   11/01/12 1615    fluconazole (DIFLUCAN) tablet 200 mg        200 mg Oral  Once 11/01/12 1607 11/01/12 1636   11/01/12 1000   levofloxacin (LEVAQUIN) IVPB 750 mg  Status:  Discontinued        750 mg 100 mL/hr over 90 Minutes Intravenous Every 48 hours 10/30/12 1108 10/31/12 1428   11/01/12 1000   levofloxacin (LEVAQUIN) tablet 750 mg  Status:  Discontinued        750 mg Oral Every 48 hours 10/31/12 1428 11/04/12 1124   10/29/12 1045   levofloxacin (LEVAQUIN) IVPB 750 mg  Status:  Discontinued        750 mg 100 mL/hr over 90 Minutes Intravenous Every 24 hours 10/29/12 1034 10/30/12 1108          Assessment/Plan: s/p Procedure(s) (LRB) with comments: LAPAROSCOPY DIAGNOSTIC (N/A) EXPLORATORY LAPAROTOMY (N/A) LAPAROSCOPIC LYSIS OF ADHESIONS (N/A) LYSIS OF ADHESION ()   - s/p percutaneous drainage for pelvic fluid collection with output diminishing and negative cultures - for repeat CT per surgery today. IR to follow up on results of same.   LOS: 28 days    CAMPBELL,PAMELA D, PA-C 11/26/2012

## 2012-11-26 NOTE — Progress Notes (Signed)
Nutrition Brief Note  Diet: Regular, intake 25-50% of meals Supplements: Ensure pudding 50-100%, Ensure Complete 50%  Had a long discussion with pt about the importance of nutrition in her recovery. Pt reports she has always been a small eater, even before admission. Pt reports she gets sick if she overeats. Provided handout that was reviewed thoroughly with pt that discussed high calorie/protein diet. Ordered high calorie/protein snacks. Pt states she has been doing well with the supplements, consuming at least 50% of them. Pt states she knows she needs to improve her nutrition and is working hard on this issue. Assisted pt with ordering lunch. Will continue to monitor.  Per dietary recall and report of supplement intake, estimate pt's daily intake for the past few days to be around 1000 calories, 55g protein - meeting 67% estimated calorie needs, 78% estimated protein needs.  Levon Hedger MS, RD, LDN [phone] Pager [phone] After Hours Pager

## 2012-11-26 NOTE — Progress Notes (Signed)
Agree with above, plan for repeat ct

## 2012-11-26 NOTE — Progress Notes (Signed)
19 Days Post-Op  Subjective: Taking PO's, not much, small BM yesterday, says she hasn't walked except with PT. Agree's rehab facility may be needed before going to home.  Objective: Vital signs in last 24 hours: Temp:  [98 F (36.7 C)-99.1 F (37.3 C)] 98 F (36.7 C) (01/21 0459) Pulse Rate:  [88-405] 88  (01/21 0459) Resp:  [16-20] 18  (01/21 0459) BP: (115-139)/(57-69) 116/58 mmHg (01/21 0459) SpO2:  [97 %-100 %] 98 % (01/21 0810) Last BM Date: 11/24/12  +BM recorded yesterday, Diet: regular, afebrile, VSS, H/H is up to 8.8/28.3  Intake/Output from previous day: 01/20 0701 - 01/21 0700 In: 2601.5 [P.O.:460; I.V.:700; Blood:562.5; TPN:879] Out: 1560 [Urine:1550; Drains:10] Intake/Output this shift:    General appearance: alert, cooperative and no distress Resp: she moves very little air on exam GI: soft, still mildly distended, incision looks fine +BS, +flatus, small BM yesterday  Lab Results:   [facility] 11/26/12 0700 11/25/12 0650  WBC 7.7 7.5  HGB 8.8* 6.9*  HCT 28.3* 22.3*  PLT 384 405*    BMET  Basename 11/26/12 0700 11/25/12 0650  NA 136 135  K 4.3 4.4  CL 92* 93*  CO2 41* 42*  GLUCOSE 143* 129*  BUN 20 20  CREATININE 0.44* 0.44*  CALCIUM 9.3 9.3   PT/INR No results found for this basename: LABPROT:2,INR:2 in the last 72 hours   Lab 11/26/12 0700 11/25/12 0650 11/21/12 0410 11/20/12 0520  AST 26 29 17 23   ALT 33 31 17 22   ALKPHOS 205* 195* 126* 136*  BILITOT 0.2* 0.2* 0.2* 0.3  PROT 5.0* 4.9* 4.8* 5.1*  ALBUMIN 1.7* 1.7* 1.7* 1.9*     Lipase  No results found for this basename: lipase     Studies/Results: No results found.  Medications:    . albuterol  2.5 mg Nebulization Q6H  . antiseptic oral rinse  15 mL Mouth Rinse QID  . benzonatate  200 mg Oral TID  . bisacodyl  10 mg Rectal Daily  . budesonide  0.25 mg Nebulization QID  . chlorhexidine  15 mL Mouth Rinse BID  . feeding supplement  237 mL Oral BID BM  . feeding supplement  1  Container Oral TID BM  . heparin  5,000 Units Subcutaneous Q8H  . insulin aspart  0-5 Units Subcutaneous QHS  . insulin aspart  0-9 Units Subcutaneous TID WC  . lip balm  1 application Topical BID  . pantoprazole  40 mg Oral Daily  . sodium chloride  10-40 mL Intracatheter Q12H  . 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  Hypertension  Hyperlipidemia Hx of ovarian ca with abdominal hysterectomy  Post op anemia; transfused 11/25/10 PCM prealbumin is 6.9, 10.4 (11/17/12) on TNA CT scan 11/19/12 shows 2 x 6 cn fluid collection in the cul de sac thath could represent abscess, Plan to get percutaneous drain today.(drain placed by IR 11/20/12)  Plan:  Repeat CT today, ask nutrition to see and help with PO's,   Nutrition consult, Wean TNA, if we need to we can restart the TNA   LOS: 28 days    Alisia Vanengen 11/26/2012

## 2012-11-26 NOTE — Progress Notes (Signed)
Physical Therapy Treatment Patient Details Name: [name] MRN: [id number] DOB: May 11, 1941 Today's Date: 11/26/2012 Time: 3086-5784 PT Time Calculation (min): 38 min  PT Assessment / Plan / Recommendation Comments on Treatment Session  Pt still hopefull to D/C home but realizes that may not happen and she most likely will need to go to SNF.    Follow Up Recommendations  SNF     Does the patient have the potential to tolerate intense rehabilitation     Barriers to Discharge        Equipment Recommendations       Recommendations for Other Services    Frequency Min 3X/week   Plan Discharge plan remains appropriate    Precautions / Restrictions Precautions Precautions: Fall Precaution Comments: 2 lts O2 Restrictions Weight Bearing Restrictions: No   Pertinent Vitals/Pain No c/o pain    Mobility  Bed Mobility Bed Mobility: Supine to Sit;Sit to Supine Supine to Sit: 4: Min guard;4: Min assist Sit to Supine: 4: Min guard;4: Min assist Details for Bed Mobility Assistance: assist for trunk to upright and then to lift LEs onto bed to return to supine. Transfers Transfers: Sit to Stand;Stand to Sit Sit to Stand: 4: Min guard;4: Min assist;From bed;From toilet Stand to Sit: 4: Min guard;4: Min assist;To toilet;To bed Details for Transfer Assistance: increased time and one VC on safety with turns Ambulation/Gait Ambulation/Gait Assistance: 1: +2 Total assist Ambulation/Gait: Patient Percentage: 90% Ambulation Distance (Feet): 82 Feet (45' then 75' with 2 sitting rest breaks) Assistive device: Rolling Dec Ambulation/Gait Assistance Details: + 2 for equipment (IV/O2 tank) and chair following behind which pt likes. Avg O2 sats dueing gat 88-90%. Gait Pattern: Step-through pattern;Decreased stride length;Trunk flexed Gait velocity: decreased     PT Goals                         progressing    Visit Information  Last PT Received On: 11/26/12 Assistance Needed: +2  (for amb, pt likes to have chair follow)    Subjective Data  Subjective: Can you come back tommorrow Patient Stated Goal: get better   Cognition       Balance     End of Session PT - End of Session Equipment Utilized During Treatment: Gait belt;Oxygen Activity Tolerance: Patient limited by fatigue Patient left: in bed;with call bell/phone within reach   Felecia Shelling  PTA [facility]  Acute  Rehab Pager     [phone]

## 2012-11-26 NOTE — Progress Notes (Signed)
Pt profile: 72 y/o wf admitted by [facility] 12/24 with dx of AECOP and treated accordingly with improvement in respiratory symptoms. She developed progressive abdominal distention with KUB and CT abd c/w SBO. Underwent exploratory laparotomy, lysis of adhesions 1/02. Returned to ICU on vent. PCCM asked to assist with vent/ICU mgmt   Lines, Tubes, etc: ETT 1/02 >> 1/03 R IJ CVL 1/02 >> out  Microbiology: MRSA PCR 1/02 >> NEG 1/3 BC x 2 >>not collected 1/3 UC >> NEG  Antibiotics:  vanco (empiric, fever) 1/3 >> 1/04 Zosyn (empiric, fever) 1/3 >> 1/10    Studies/Events: 12/31 CT abd: Small bowel obstruction with dilated small bowel to the level of the distal ileum in the region of the pelvis where there is abrupt change of caliber which may be related to adhesions 1/2 Hypotension post op >pressor support  1/3 low grade fever/elevated WBC >pan cx  1/9 - ileus continues, n/v with NGT clamped, stable but tenuous respiratory status   Consults:  CCS 12/30  Best Practice: DVT: SQ hep SUP: PPI Nutrition: none Glycemic control: SSI Sedation/analgesia: PRN fentanyl   [name]: she remains stable from a pulmonary standpoint.  No increased wob.  NG tube out, and taking liquids. C/o cough that is dry, and feels is postnasal drip.  Leads to increased sob.  Still has breakthru on tussionex.  Started on tessalon pearls and has helped.   Objective:  Filed Vitals:   11/26/12 0156 11/26/12 0459 11/26/12 0810 11/26/12 1328  BP:  116/58    Pulse:  88    Temp:  98 F (36.7 C)    TempSrc:  Tympanic    Resp:  18    Height:      Weight:      SpO2: 98% 98% 98% 96%  2 liters  Gen: wd female, nad.   HEENT: NG out, OP clear Neck: no JVD or LN Chest: decreased bs, no wheezing or rhonchi Cardiac: RRR, no MRG Abd: decreased bs, but present.  Ext:no edema or cyanosis Neuro: a/o x 3 , moves all 4.   BMET  Lab 11/26/12 0700 11/25/12 0650 11/24/12 0400 11/22/12 0510 11/21/12 0410 11/20/12 0520  NA 136  135 134* 132* 132* --  K 4.3 4.4 -- -- -- --  CL 92* 93* 93* 94* 92* --  CO2 41* 42* 40* 37* 40* --  GLUCOSE 143* 129* 118* 157* 160* --  BUN 20 20 20 20 23  --  CREATININE 0.44* 0.44* 0.42* 0.38* 0.40* --  CALCIUM 9.3 9.3 9.5 9.2 9.1 --  MG -- 1.8 -- -- 1.8 1.9  PHOS -- 3.7 -- -- 2.6 2.9   CBC  Lab 11/26/12 0700 11/25/12 0650 11/23/12 0525  HGB 8.8* 6.9* 7.6*  HCT 28.3* 22.3* 24.6*  WBC 7.7 7.5 10.5  PLT 384 405* 474*     IMPRESSION:  SBO (small bowel obstruction) - Status post laparotomy per CCS.  NG out.   Post-op fluid collection/ abscess.  s/p perc drain placed by IR on 1/15: cultures negative.  Plan: -f/u CT abd/pelvis  -rec's per Surgery   Emphysema, severe - No acute bronchospasm or increased wob at rest.   Plan: -aggressive pulmonary hygiene IS/Flutter -pulmicort, BD's -tussionex prn for cough since it leads to severe sob, but need to be careful with impact on gut motility.  -check cxr today.   Anemia - in setting of chronic disease / acute critical illness.  Has received one unit yest.   Deconditioning  Plan: Likely  will need SNF  Resolved issues  Post-operative respiratory failure - Resolved.

## 2012-11-26 NOTE — Progress Notes (Signed)
CRITICAL VALUE ALERT  Critical value received:  CO2: 42  Date of notification: 11/17/12   Time of notification: 0815  Critical value read back:yes   Nurse who received alert: Farley Ly  MD notified (1st page): Dr. Sung Amabile    Time of first page:  0845    MD notified (2nd page):  Time of second page:  Responding MD; Dr. Shelle Iron   Time MD responded: [phone]

## 2012-11-27 ENCOUNTER — Inpatient Hospital Stay (HOSPITAL_COMMUNITY): Payer: Medicare Other

## 2012-11-27 LAB — GLUCOSE, CAPILLARY
Glucose-Capillary: 101 mg/dL — ABNORMAL HIGH (ref 70–99)
Glucose-Capillary: 81 mg/dL (ref 70–99)

## 2012-11-27 MED ORDER — FUROSEMIDE 20 MG PO TABS
20.0000 mg | ORAL_TABLET | Freq: Every day | ORAL | Status: DC
  Administered 2012-11-27 – 2012-11-28 (×2): 20 mg via ORAL
  Filled 2012-11-27 (×2): qty 1

## 2012-11-27 NOTE — Progress Notes (Signed)
Patient seen and examined.  CT reviewed.  Not much coming out of drain so will have it removed.  Bowel function is slow but no clinical evidence of obstruction.  Continue current diet.

## 2012-11-27 NOTE — Progress Notes (Signed)
Pt seen and examined.  Agree with note above.

## 2012-11-27 NOTE — Progress Notes (Signed)
OT Cancellation Note  Patient Details Name: UNITY LUEPKE MRN: [id number] DOB: 1941-01-20   Cancelled Treatment:    Reason Eval/Treat Not Completed: Other (comment) (pt just had ativan and is sleeping soundly)  [facility], OTR/L [phone] 1/22/20141/22/2014, 11:10 AM

## 2012-11-27 NOTE — Progress Notes (Signed)
Pt profile: 72 y/o wf admitted by [facility] 12/24 with dx of AECOP and treated accordingly with improvement in respiratory symptoms. She developed progressive abdominal distention with KUB and CT abd c/w SBO. Underwent exploratory laparotomy, lysis of adhesions 1/02. Returned to ICU on vent. PCCM asked to assist with vent/ICU mgmt   Lines, Tubes, etc: ETT 1/02 >> 1/03 R IJ CVL 1/02 >> out  Microbiology: MRSA PCR 1/02 >> NEG 1/3 BC x 2 >>not collected 1/3 UC >> NEG  Antibiotics:  vanco (empiric, fever) 1/3 >> 1/04 Zosyn (empiric, fever) 1/3 >> 1/10    Studies/Events: 12/31 CT abd: Small bowel obstruction with dilated small bowel to the level of the distal ileum in the region of the pelvis where there is abrupt change of caliber which may be related to adhesions 1/2 Hypotension post op >pressor support  1/3 low grade fever/elevated WBC >pan cx  1/9 - ileus continues, n/v with NGT clamped, stable but tenuous respiratory status   Consults:  CCS 12/30  Best Practice: DVT: SQ hep SUP: PPI Nutrition: none Glycemic control: SSI Sedation/analgesia: PRN fentanyl   [name]: she remains stable from a pulmonary standpoint.  No increased wob.  NG tube out, and taking liquids. C/o cough that is dry, and feels is postnasal drip.  Leads to increased sob.  Still has breakthru on tussionex.  Started on tessalon pearls and has helped.   Objective:  Filed Vitals:   11/26/12 2044 11/26/12 2119 11/27/12 0523 11/27/12 0747  BP:  117/65 102/58   Pulse:   83   Temp:   97.9 F (36.6 C)   TempSrc:   Oral   Resp:   16   Height:      Weight:      SpO2: 94%  98% 98%  2 liters  Gen: wd female, nad.   HEENT: NG out, OP clear Neck: no JVD or LN Chest: decreased bs, no wheezing or rhonchi Cardiac: RRR, no MRG Abd: decreased bs, but present.  Ext:no edema or cyanosis Neuro: a/o x 3 , moves all 4.   BMET  Lab 11/26/12 0700 11/25/12 0650 11/24/12 0400 11/22/12 0510 11/21/12 0410  NA 136 135 134*  132* 132*  K 4.3 4.4 -- -- --  CL 92* 93* 93* 94* 92*  CO2 41* 42* 40* 37* 40*  GLUCOSE 143* 129* 118* 157* 160*  BUN 20 20 20 20 23   CREATININE 0.44* 0.44* 0.42* 0.38* 0.40*  CALCIUM 9.3 9.3 9.5 9.2 9.1  MG -- 1.8 -- -- 1.8  PHOS -- 3.7 -- -- 2.6   CBC  Lab 11/26/12 0700 11/25/12 0650 11/23/12 0525  HGB 8.8* 6.9* 7.6*  HCT 28.3* 22.3* 24.6*  WBC 7.7 7.5 10.5  PLT 384 405* 474*   PCXR: small bilateral effusions  IMPRESSION:  SBO (small bowel obstruction) - Status post laparotomy per CCS.  NG out.   Post-op fluid collection/ abscess.  s/p perc drain placed by IR on 1/15: cultures negative, and she is off all abx. The CT scan on 1/22 showed:  The pelvic fluid collection appears to be decompressed and smaller in size. There is a new fluid collection in the pelvis that measures up 2.3 cm. Dilated loops of small bowel throughout the abdomen. There is contrast within the colon. Findings could represent a partial small bowel obstruction or ileus.  Post-op ileus  Plan: -rec's per Surgery -may get drain out soon   Emphysema, severe - No acute bronchospasm or increased wob at rest.  Plan: -aggressive pulmonary hygiene IS/Flutter -pulmicort, BD's -tussionex prn for cough since it leads to severe sob, but need to be careful with impact on gut motility.   Anemia - in setting of chronic disease / acute critical illness.  Has received one unit yest.   Total body overload: more likely d/t diminished protein stores. Surgical svc added low diuretic dosing. Seems reasonable.  Plan Closely follow chemistry  Deconditioning  Plan: Likely will need SNF  Resolved issues  Post-operative respiratory failure - Resolved.

## 2012-11-27 NOTE — Progress Notes (Signed)
Clinical Social Worker followed up with pt in regard to SNF placement. Of the facilities that pt had expressed interest in, [facility] was able to offer pt a bed. Clinical Child psychotherapist discussed with pt who is agreeable to [facility] when medically stable for discharge. Clinical Social Worker provided support as pt expressed that she does not feel that she is quite ready to leave the hospital as she would like to ensure she is able to tolerate a diet prior to discharge. Clinical Social Worker will continue to [employer] and assist with pt discharge needs when pt medically ready for discharge.  Jacklynn Lewis, MSW, LCSWA  Clinical Social Work [phone]

## 2012-11-27 NOTE — Progress Notes (Signed)
Subjective: Pt feeling some better. Passing gas, but no BM CT done yesterday reviewed with MD. Essential resolution of abscess, no remaining fluid. New small collection noted, but not amenable to drainage. Discussed with CCS, ok to remove drainObjective: Physical Exam: BP 102/58  Pulse 83  Temp 97.9 F (36.6 C) (Oral)  Resp 16  Ht 5\' 5"  (1.651 m)  Wt 114 lb 6.7 oz (51.9 kg)  BMI 19.04 kg/m2  SpO2 96% Drain intact, minimal serosanguinous output. Drain removed without difficulty, sterile dressing applied.   Labs: CBC  Basename 11/26/12 0700 11/25/12 0650  WBC 7.7 7.5  HGB 8.8* 6.9*  HCT 28.3* 22.3*  PLT 384 405*   BMET  Basename 11/26/12 0700 11/25/12 0650  NA 136 135  K 4.3 4.4  CL 92* 93*  CO2 41* 42*  GLUCOSE 143* 129*  BUN 20 20  CREATININE 0.44* 0.44*  CALCIUM 9.3 9.3   LFT  Basename 11/26/12 0700  PROT 5.0*  ALBUMIN 1.7*  AST 26  ALT 33  ALKPHOS 205*  BILITOT 0.2*  BILIDIR --  IBILI --  LIPASE --   PT/INR No results found for this basename: LABPROT:2,INR:2 in the last 72 hours   Studies/Results: Dg Chest 2 View  11/27/2012  *RADIOLOGY REPORT*  Clinical Data: Cough, shortness of breath.  CHEST - 2 VIEW  Comparison: 11/18/2012  Findings: Right PICC line remains in place, unchanged.  Interval removal of NG tube.  Heart is normal size.  Lungs are clear.  Small bilateral pleural effusions present.  No acute bony abnormality.  IMPRESSION: Small bilateral effusions seen on the lateral view.   Original Report Authenticated By: Charlett Nose, M.D.    Ct Abdomen Pelvis W Contrast  11/26/2012  *RADIOLOGY REPORT*  Clinical Data: Evaluate drainage catheter and pelvic abscess.  CT ABDOMEN AND PELVIS WITH CONTRAST  Technique:  Multidetector CT imaging of the abdomen and pelvis was performed following the standard protocol during bolus administration of intravenous contrast.  Contrast: 80mL OMNIPAQUE IOHEXOL 300 MG/ML  SOLN  Comparison: Abdominal CT 11/19/2012   Findings: There are emphysematous changes in the lungs. There are small bilateral pleural effusions, right side greater than left. There is no evidence for free intraperitoneal air.  There is diffuse subcutaneous edema.  Post surgical changes along the anterior abdominal wall.  Stable sub centimeter low density in the left hepatic dome is likely an incidental finding.  Otherwise, there is a normal appearance of the liver, gallbladder and portal venous system.  No gross abnormality to the pancreas, spleen, adrenal glands or kidneys.  The uterus has been removed.  There is a left transgluteal pigtail drainage catheter.  The pelvic collection now measures 5.8 x 1.2 cm and previously measured 6.0 x 2.2 cm.  The pelvic fluid collection appears to be decompressed and there is no significant fluid within it.  There are dilated loops of small bowel along the anterior lower abdomen.  There is contrast in the colon but this could be the left over from the exam on 11/19/2012.  There is mild mesenteric edema.  There is a new low density collection in the sigmoid mesocolon on sequence #2, image number 56.  The structure measures up to 2.3 cm. This is suspicious for a new fluid collection.  No acute bony abnormality.  IMPRESSION: Drainage catheter within the pelvic collection.  The pelvic fluid collection appears to be decompressed and smaller in size.  There is a new fluid collection in the pelvis that measures up 2.3  cm.  Dilated loops of small bowel throughout the abdomen.  There is contrast within the colon.  Findings could represent a partial small bowel obstruction or ileus.  Diffuse subcutaneous edema.  Bilateral pleural effusions, right side greater than left.   Original Report Authenticated By: Richarda Overlie, M.D.     Assessment/Plan: - s/p percutaneous drainage for pelvic fluid collection with output diminishing and negative cultures  Drain removed. Care per CCS Call if needed    LOS: 29 days    Brayton El  PA-C 11/27/2012 2:12 PM

## 2012-11-27 NOTE — Progress Notes (Signed)
20 Days Post-Op  Subjective: She had what for her is a good breakfast, couldn't eat much after contrast last PM for her CT.  She is passing allot of gas, but no BM.  She was able to keep all of the contrast down.    Objective: Vital signs in last 24 hours: Temp:  [97.6 F (36.4 C)-97.9 F (36.6 C)] 97.9 F (36.6 C) (01/22 0523) Pulse Rate:  [83-105] 83  (01/22 0523) Resp:  [16-19] 16  (01/22 0523) BP: (102-162)/(58-84) 102/58 mmHg (01/22 0523) SpO2:  [93 %-100 %] 98 % (01/22 0747) Last BM Date: 11/25/12  240 PO recorded,  Stool yesterday, none today, afebrile, VSS, Films show small bilateral effusions, catheter in fluid collection shows site decompress, new 2.3 cm collection, dialated loops of SB = SBO vs ileus, and diffuse SQ tissue edema, may be worse than last week.  Intake/Output from previous day: 01/21 0701 - 01/22 0700 In: 780 [P.O.:240; I.V.:540] Out: 1600 [Urine:1600] Intake/Output this shift:    General appearance: alert, cooperative, no distress and frail Resp: BS down both sides, no increased dyspnea GI: soft, distended few BS, incision looks fine.   Extremities: + edema  Lab Results:   Basename 11/26/12 0700 11/25/12 0650  WBC 7.7 7.5  HGB 8.8* 6.9*  HCT 28.3* 22.3*  PLT 384 405*    BMET  Basename 11/26/12 0700 11/25/12 0650  NA 136 135  K 4.3 4.4  CL 92* 93*  CO2 41* 42*  GLUCOSE 143* 129*  BUN 20 20  CREATININE 0.44* 0.44*  CALCIUM 9.3 9.3   PT/INR No results found for this basename: LABPROT:2,INR:2 in the last 72 hours   Lab 11/26/12 0700 11/25/12 0650 11/21/12 0410  AST 26 29 17   ALT 33 31 17  ALKPHOS 205* 195* 126*  BILITOT 0.2* 0.2* 0.2*  PROT 5.0* 4.9* 4.8*  ALBUMIN 1.7* 1.7* 1.7*     Lipase  No results found for this basename: lipase     Studies/Results: Dg Chest 2 View  11/27/2012  *RADIOLOGY REPORT*  Clinical Data: Cough, shortness of breath.  CHEST - 2 VIEW  Comparison: 11/18/2012  Findings: Right PICC line remains in  place, unchanged.  Interval removal of NG tube.  Heart is normal size.  Lungs are clear.  Small bilateral pleural effusions present.  No acute bony abnormality.  IMPRESSION: Small bilateral effusions seen on the lateral view.   Original Report Authenticated By: Charlett Nose, M.D.    Ct Abdomen Pelvis W Contrast  11/26/2012  *RADIOLOGY REPORT*  Clinical Data: Evaluate drainage catheter and pelvic abscess.  CT ABDOMEN AND PELVIS WITH CONTRAST  Technique:  Multidetector CT imaging of the abdomen and pelvis was performed following the standard protocol during bolus administration of intravenous contrast.  Contrast: 80mL OMNIPAQUE IOHEXOL 300 MG/ML  SOLN  Comparison: Abdominal CT 11/19/2012  Findings: There are emphysematous changes in the lungs. There are small bilateral pleural effusions, right side greater than left. There is no evidence for free intraperitoneal air.  There is diffuse subcutaneous edema.  Post surgical changes along the anterior abdominal wall.  Stable sub centimeter low density in the left hepatic dome is likely an incidental finding.  Otherwise, there is a normal appearance of the liver, gallbladder and portal venous system.  No gross abnormality to the pancreas, spleen, adrenal glands or kidneys.  The uterus has been removed.  There is a left transgluteal pigtail drainage catheter.  The pelvic collection now measures 5.8 x 1.2 cm and previously  measured 6.0 x 2.2 cm.  The pelvic fluid collection appears to be decompressed and there is no significant fluid within it.  There are dilated loops of small bowel along the anterior lower abdomen.  There is contrast in the colon but this could be the left over from the exam on 11/19/2012.  There is mild mesenteric edema.  There is a new low density collection in the sigmoid mesocolon on sequence #2, image number 56.  The structure measures up to 2.3 cm. This is suspicious for a new fluid collection.  No acute bony abnormality.  IMPRESSION: Drainage  catheter within the pelvic collection.  The pelvic fluid collection appears to be decompressed and smaller in size.  There is a new fluid collection in the pelvis that measures up 2.3 cm.  Dilated loops of small bowel throughout the abdomen.  There is contrast within the colon.  Findings could represent a partial small bowel obstruction or ileus.  Diffuse subcutaneous edema.  Bilateral pleural effusions, right side greater than left.   Original Report Authenticated By: Richarda Overlie, M.D.     Medications:    . albuterol  2.5 mg Nebulization Q6H  . antiseptic oral rinse  15 mL Mouth Rinse QID  . benzonatate  200 mg Oral TID  . bisacodyl  10 mg Rectal Daily  . budesonide  0.25 mg Nebulization QID  . chlorhexidine  15 mL Mouth Rinse BID  . feeding supplement  237 mL Oral BID BM  . feeding supplement  1 Container Oral TID BM  . heparin  5,000 Units Subcutaneous Q8H  . insulin aspart  0-5 Units Subcutaneous QHS  . insulin aspart  0-9 Units Subcutaneous TID WC  . lip balm  1 application Topical BID  . pantoprazole  40 mg Oral Daily  . sodium chloride  10-40 mL Intracatheter Q12H  . 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  Hypertension  Hyperlipidemia Hx of ovarian ca with abdominal hysterectomy  Post op anemia; transfused 11/25/10  PCM prealbumin is 6.9, 10.4 (11/17/12) on TNA CT scan 11/19/12 shows 2 x 6 cn fluid collection in the cul de sac thath could represent abscess, Plan to get percutaneous drain today.(drain placed by IR 11/20/12) Fluid overload  Plan:  Even though she is rather distended and looks like an ileus on CT, she is tolerating PO's especially her 2 cups of contrast very well.  +flauts, +BS.  Go slow with po's for now. Lasix daily, start with 20mg ,   replace K+ PRN thru her PICC, try not to dry her out to quickly restrict her IV intake as much as possible. She likes Strawberry Ensure.  I will discuss CT with Dr. Dwain Sarna, may be able to take drain out, and just watch 2nd collection.      LOS: 29 days    Katharyn Schauer 11/27/2012

## 2012-11-28 ENCOUNTER — Encounter (HOSPITAL_COMMUNITY): Payer: Medicare Other

## 2012-11-28 LAB — COMPREHENSIVE METABOLIC PANEL
ALT: 40 U/L — ABNORMAL HIGH (ref 0–35)
AST: 36 U/L (ref 0–37)
CO2: 39 mEq/L — ABNORMAL HIGH (ref 19–32)
Calcium: 8.9 mg/dL (ref 8.4–10.5)
Creatinine, Ser: 0.57 mg/dL (ref 0.50–1.10)
GFR calc Af Amer: >90 mL/min (ref >90)
GFR calc non Af Amer: >90 mL/min (ref >90)
Glucose, Bld: 81 mg/dL (ref 70–99)
Sodium: 132 mEq/L — ABNORMAL LOW (ref 135–145)
Total Protein: 5 g/dL — ABNORMAL LOW (ref 6.0–8.3)

## 2012-11-28 LAB — GLUCOSE, CAPILLARY
Glucose-Capillary: 77 mg/dL (ref 70–99)
Glucose-Capillary: 140 mg/dL — ABNORMAL HIGH (ref 70–99)
Glucose-Capillary: 130 mg/dL — ABNORMAL HIGH (ref 70–99)

## 2012-11-28 LAB — CBC
Platelets: 330 10*3/uL (ref 150–400)
RBC: 2.99 MIL/uL — ABNORMAL LOW (ref 3.87–5.11)
RDW: 15.3 % (ref 11.5–15.5)
WBC: 7.9 10*3/uL (ref 4.0–10.5)

## 2012-11-28 LAB — PHOSPHORUS: Phosphorus: 3.4 mg/dL (ref 2.3–4.6)

## 2012-11-28 MED ORDER — FUROSEMIDE 40 MG PO TABS
40.0000 mg | ORAL_TABLET | Freq: Every day | ORAL | Status: DC
  Administered 2012-11-29: 40 mg via ORAL
  Filled 2012-11-28: qty 1

## 2012-11-28 MED ORDER — LACTATED RINGERS IV BOLUS (SEPSIS): 1000.0000 mL | Freq: Three times a day (TID) | INTRAVENOUS | Status: DC | PRN

## 2012-11-28 MED ORDER — BISACODYL 10 MG RE SUPP: 10.0000 mg | Freq: Once | RECTAL | Status: DC

## 2012-11-28 MED ORDER — ACETAMINOPHEN 500 MG PO TABS
1000.0000 mg | ORAL_TABLET | Freq: Three times a day (TID) | ORAL | Status: DC
  Administered 2012-11-28 – 2012-11-29 (×4): 1000 mg via ORAL
  Filled 2012-11-28 (×7): qty 2

## 2012-11-28 MED ORDER — CALCIUM CARBONATE 1250 (500 CA) MG PO TABS
1.0000 | ORAL_TABLET | Freq: Two times a day (BID) | ORAL | Status: DC
  Administered 2012-11-28 – 2012-11-29 (×3): 500 mg via ORAL
  Filled 2012-11-28 (×4): qty 1

## 2012-11-28 MED ORDER — POLYETHYLENE GLYCOL 3350 17 G PO PACK
17.0000 g | PACK | Freq: Every day | ORAL | Status: DC
  Administered 2012-11-28 – 2012-11-29 (×2): 17 g via ORAL
  Filled 2012-11-28 (×2): qty 1

## 2012-11-28 MED ORDER — RISEDRONATE SODIUM 150 MG PO TABS: 150.0000 mg | ORAL_TABLET | ORAL | Status: DC

## 2012-11-28 MED ORDER — POTASSIUM CHLORIDE 10 MEQ/50ML IV SOLN
10.0000 meq | Freq: Once | INTRAVENOUS | Status: AC
  Administered 2012-11-28: 10 meq via INTRAVENOUS
  Filled 2012-11-28: qty 50

## 2012-11-28 MED ORDER — ALTEPLASE 2 MG IJ SOLR
2.0000 mg | Freq: Once | INTRAMUSCULAR | Status: AC
  Administered 2012-11-28: 2 mg
  Filled 2012-11-28: qty 2

## 2012-11-28 MED ORDER — POTASSIUM CHLORIDE CRYS ER 20 MEQ PO TBCR
40.0000 meq | EXTENDED_RELEASE_TABLET | Freq: Every day | ORAL | Status: DC
  Administered 2012-11-28 – 2012-11-29 (×2): 40 meq via ORAL
  Filled 2012-11-28 (×2): qty 2

## 2012-11-28 MED ORDER — SODIUM CHLORIDE 0.9 % IJ SOLN
3.0000 mL | Freq: Two times a day (BID) | INTRAMUSCULAR | Status: DC
  Administered 2012-11-29: 3 mL via INTRAVENOUS

## 2012-11-28 MED ORDER — CALCIUM CARBONATE 600 MG PO TABS
1.0000 | ORAL_TABLET | Freq: Two times a day (BID) | ORAL | Status: DC
Start: 2012-11-28 — End: 2012-11-28
  Filled 2012-11-28 (×2): qty 1

## 2012-11-28 MED ORDER — SODIUM CHLORIDE 0.9 % IJ SOLN: 3.0000 mL | INTRAMUSCULAR | Status: DC | PRN

## 2012-11-28 NOTE — Progress Notes (Signed)
Pt profile: 72 y/o wf admitted by [facility] 12/24 with dx of AECOP and treated accordingly with improvement in respiratory symptoms. She developed progressive abdominal distention with KUB and CT abd c/w SBO. Underwent exploratory laparotomy, lysis of adhesions 1/02. Returned to ICU on vent. PCCM asked to assist with vent/ICU mgmt   Lines, Tubes, etc: ETT 1/02 >> 1/03 R IJ CVL 1/02 >> out  Microbiology: MRSA PCR 1/02 >> NEG 1/3 BC x 2 >>not collected 1/3 UC >> NEG  Antibiotics:  vanco (empiric, fever) 1/3 >> 1/04 Zosyn (empiric, fever) 1/3 >> 1/10    Studies/Events: 12/31 CT abd: Small bowel obstruction with dilated small bowel to the level of the distal ileum in the region of the pelvis where there is abrupt change of caliber which may be related to adhesions 1/2 Hypotension post op >pressor support  1/3 low grade fever/elevated WBC >pan cx  1/9 - ileus continues, n/v with NGT clamped, stable but tenuous respiratory status   Consults:  CCS 12/30  Best Practice: DVT: SQ hep SUP: PPI Nutrition: none Glycemic control: SSI Sedation/analgesia: PRN fentanyl   [name]: she remains stable from a pulmonary standpoint.  No increased wob.  NG tube out, and taking liquids. C/o cough that is dry, and feels is postnasal drip. Better with tessalon pearls  Objective:  Filed Vitals:   11/27/12 2114 11/28/12 0553 11/28/12 0605 11/28/12 0819  BP: 136/70 123/64    Pulse: 103 90    Temp: 98.3 F (36.8 C) 98.1 F (36.7 C)    TempSrc: Oral Oral    Resp: 18 18    Height:      Weight:   119 lb 11.4 oz (54.3 kg)   SpO2: 93% 98%  95%  2 liters  Gen: wd female, nad.   HEENT: NG out, OP clear Neck: no JVD or LN Chest: decreased bs, no wheezing or rhonchi Cardiac: RRR, no MRG Abd: decreased bs, but present.  Ext: minimal edema or cyanosis Neuro: a/o x 3 , moves all 4.   BMET  Lab 11/28/12 0610 11/26/12 0700 11/25/12 0650 11/24/12 0400 11/22/12 0510  NA 132* 136 135 134* 132*  K 3.9 4.3 --  -- --  CL 87* 92* 93* 93* 94*  CO2 39* 41* 42* 40* 37*  GLUCOSE 81 143* 129* 118* 157*  BUN 11 20 20 20 20   CREATININE 0.57 0.44* 0.44* 0.42* 0.38*  CALCIUM 8.9 9.3 9.3 9.5 9.2  MG 1.6 -- 1.8 -- --  PHOS 3.4 -- 3.7 -- --   CBC  Lab 11/28/12 0610 11/26/12 0700 11/25/12 0650  HGB 9.0* 8.8* 6.9*  HCT 28.7* 28.3* 22.3*  WBC 7.9 7.7 7.5  PLT 330 384 405*   PCXR: small bilateral effusions  IMPRESSION:  SBO (small bowel obstruction) - Status post laparotomy per CCS.  NG out.   Post-op fluid collection/ abscess.  s/p perc drain placed by IR on 1/15: cultures negative, and she is off all abx.  Post-op ileus  Plan: -rec's per Surgery   Emphysema, severe - No acute bronchospasm or increased wob at rest.   Plan: -aggressive pulmonary hygiene IS/Flutter -pulmicort, BD's -tussionex prn for cough since it leads to severe sob, but need to be careful with impact on gut motility.   Anemia - in setting of chronic disease / acute critical illness.  Hgb stable currently  Total body overload: more likely d/t diminished protein stores, with more third spaced fluid than too much intravascular fluid. Surgical svc added  low diuretic dosing. Watch carefully for contraction alkalosis   Plan Closely follow chemistry  Deconditioning  Plan: Likely will need SNF  The pt is stable from a pulmonary standpoint for discharge.  Await Ok from surgical service.

## 2012-11-28 NOTE — Progress Notes (Signed)
Clinical Social Worker received notification from RN that pt had questions about rehab placement. Clinical Social Worker met with pt at bedside. Clinical Social Worker clarified pt questions about [facility] and provided support. Pt is agreeable to [facility] where pt will have a private room. Clinical Social Worker spoke to [facility] and bed is available for when pt discharged from hospital. Clinical Social Worker to facilitate pt discharge needs. Pt reports anticipating discharge Friday or through the weekend.   Jacklynn Lewis, MSW, LCSWA  Clinical Social Work [phone]

## 2012-11-28 NOTE — Progress Notes (Signed)
Patient ID: [name], female   DOB: 08-Dec-1940, 72 y.o.   MRN: [id number] 21 Days Post-Op  Subjective: Eating better but still small amounts at a time No nausea or vomiting +flatus but hasn't had good BM in 4 days denies pain very anxious about discharge to rehab.    Objective: Vital signs in last 24 hours: Temp:  [97.6 F (36.4 C)-98.3 F (36.8 C)] 98.1 F (36.7 C) (01/23 0553) Pulse Rate:  [90-103] 90  (01/23 0553) Resp:  [16-18] 18  (01/23 0553) BP: (120-136)/(60-70) 123/64 mmHg (01/23 0553) SpO2:  [93 %-98 %] 95 % (01/23 0819) Weight:  [119 lb 11.4 oz (54.3 kg)] 119 lb 11.4 oz (54.3 kg) (01/23 0605) Last BM Date: 11/25/12   Intake/Output from previous day: 01/22 0701 - 01/23 0700 In: 720 [P.O.:720] Out: 1075 [Urine:1075] Intake/Output this shift:    General: Pt awake/alert/oriented x4 in no major acute distress but deconditioned Eyes: PERRL, normal EOM. Sclera nonicteric Neuro: CN II-XII intact w/o focal sensory/motor deficits. Lymph: No head/neck/groin lymphadenopathy Psych:  No delerium/psychosis/paranoia.  More lively/alert HENT: Normocephalic, Mucus membranes dry.  No thrush Neck: Supple, No tracheal deviation Chest: No pain.  Fair but stable respiratory excursion. CV:  Pulses intact.  Regular rhythm MS: Normal AROM mjr joints.  No obvious deformity Abdomen: Soft, Nontender.  No incarcerated hernias.  Mildly distended but improving, better bowel sounds, incision c/d/i & closed Ext:  SCDs BLE.  1+ anasarca.  2+ edema BLE.  No cyanosis Skin: No petechiae / purpurae   Lab Results:   Basename 11/28/12 0610 11/26/12 0700  WBC 7.9 7.7  HGB 9.0* 8.8*  HCT 28.7* 28.3*  PLT 330 384    BMET  Basename 11/28/12 0610 11/26/12 0700  NA 132* 136  K 3.9 4.3  CL 87* 92*  CO2 39* 41*  GLUCOSE 81 143*  BUN 11 20  CREATININE 0.57 0.44*  CALCIUM 8.9 9.3   PT/INR No results found for this basename: LABPROT:2,INR:2 in the last 72 hours   Lab 11/28/12 0610  11/26/12 0700 11/25/12 0650  AST 36 26 29  ALT 40* 33 31  ALKPHOS 188* 205* 195*  BILITOT 0.3 0.2* 0.2*  PROT 5.0* 5.0* 4.9*  ALBUMIN 1.9* 1.7* 1.7*     Lipase  No results found for this basename: lipase     Studies/Results: Dg Chest 2 View  11/27/2012  *RADIOLOGY REPORT*  Clinical Data: Cough, shortness of breath.  CHEST - 2 VIEW  Comparison: 11/18/2012  Findings: Right PICC line remains in place, unchanged.  Interval removal of NG tube.  Heart is normal size.  Lungs are clear.  Small bilateral pleural effusions present.  No acute bony abnormality.  IMPRESSION: Small bilateral effusions seen on the lateral view.   Original Report Authenticated By: Charlett Nose, M.D.    Ct Abdomen Pelvis W Contrast  11/26/2012  *RADIOLOGY REPORT*  Clinical Data: Evaluate drainage catheter and pelvic abscess.  CT ABDOMEN AND PELVIS WITH CONTRAST  Technique:  Multidetector CT imaging of the abdomen and pelvis was performed following the standard protocol during bolus administration of intravenous contrast.  Contrast: 80mL OMNIPAQUE IOHEXOL 300 MG/ML  SOLN  Comparison: Abdominal CT 11/19/2012  Findings: There are emphysematous changes in the lungs. There are small bilateral pleural effusions, right side greater than left. There is no evidence for free intraperitoneal air.  There is diffuse subcutaneous edema.  Post surgical changes along the anterior abdominal wall.  Stable sub centimeter low density in the left hepatic  dome is likely an incidental finding.  Otherwise, there is a normal appearance of the liver, gallbladder and portal venous system.  No Valita Righter abnormality to the pancreas, spleen, adrenal glands or kidneys.  The uterus has been removed.  There is a left transgluteal pigtail drainage catheter.  The pelvic collection now measures 5.8 x 1.2 cm and previously measured 6.0 x 2.2 cm.  The pelvic fluid collection appears to be decompressed and there is no significant fluid within it.  There are dilated loops of  small bowel along the anterior lower abdomen.  There is contrast in the colon but this could be the left over from the exam on 11/19/2012.  There is mild mesenteric edema.  There is a new low density collection in the sigmoid mesocolon on sequence #2, image number 56.  The structure measures up to 2.3 cm. This is suspicious for a new fluid collection.  No acute bony abnormality.  IMPRESSION: Drainage catheter within the pelvic collection.  The pelvic fluid collection appears to be decompressed and smaller in size.  There is a new fluid collection in the pelvis that measures up 2.3 cm.  Dilated loops of small bowel throughout the abdomen.  There is contrast within the colon.  Findings could represent a partial small bowel obstruction or ileus.  Diffuse subcutaneous edema.  Bilateral pleural effusions, right side greater than left.   Original Report Authenticated By: Richarda Overlie, M.D.     Medications:    . albuterol  2.5 mg Nebulization Q6H  . antiseptic oral rinse  15 mL Mouth Rinse QID  . benzonatate  200 mg Oral TID  . bisacodyl  10 mg Rectal Daily  . budesonide  0.25 mg Nebulization QID  . chlorhexidine  15 mL Mouth Rinse BID  . feeding supplement  237 mL Oral BID BM  . feeding supplement  1 Container Oral TID BM  . furosemide  20 mg Oral Daily  . heparin  5,000 Units Subcutaneous Q8H  . insulin aspart  0-5 Units Subcutaneous QHS  . insulin aspart  0-9 Units Subcutaneous TID WC  . lip balm  1 application Topical BID  . pantoprazole  40 mg Oral Daily  . sodium chloride  10-40 mL Intracatheter Q12H  . tiotropium  18 mcg Inhalation Daily    Assessment/Plan 1. POD#21-Ex lap with LOA(11/07/12 Dr. Derrell Lolling: ileus resolving very slowly,   PO intake improving but not very adequate yet, encouraged PO intake multiple times a day in small amounts, likely ready for discharge this weekend but very anxious about it, tried to encourage her about this.    Try Miralax to help w constipation but watch out  for pain/nausea  Malnutrition.  Trying PO supplements & follow.  Alb <2, prealbumin is low but improving.  follow off TNA  -no more drain.  Small new pelvic collection but not drainable.  Probable loculated ascites w severe malnutrition.  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 Will need SNF for a while, I think  Hypertension  Hyperlipidemia Hx of ovarian ca with abdominal hysterectomy  Post op anemia; transfused 11/25/10          LOS: 30 days    WHITE, ELIZABETH 11/28/2012  Ardeth Sportsman, M.D., F.A.C.S. Gastrointestinal and Minimally Invasive Surgery [facility] [address]  [address] [place], [place] [address] [phone] Main / Paging [phone] Voice Mail

## 2012-11-28 NOTE — Progress Notes (Signed)
PHARMACIST - PHYSICIAN COMMUNICATION  CONCERNING: P&T Medication Policy Regarding Oral Bisphosphonates  RECOMMENDATION: Your order for alendronate (Fosamax), ibandronate (Boniva), or risedronate (Actonel) has been discontinued at this time.  If the patient's post-hospital medical condition warrants safe use of this class of drugs, please resume the pre-hospital regimen upon discharge.  DESCRIPTION:  Alendronate (Fosamax), ibandronate (Boniva), and risedronate (Actonel) can cause severe esophageal erosions in patients who are unable to remain upright at least 30 minutes after taking this medication.   Since brief interruptions in therapy are thought to have minimal impact on bone mineral density, the Pharmacy & Therapeutics Committee has established that bisphosphonate orders should be routinely discontinued during hospitalization.   To override this safety policy and permit administration of Boniva, Fosamax, or Actonel in the hospital, prescribers must write "DO NOT HOLD" in the comments section when placing the order for this class of medications.    Haynes Hoehn, PharmD 11/28/2012 10:24 AM  Pager: [phone]

## 2012-11-29 LAB — BASIC METABOLIC PANEL
BUN: 11 mg/dL (ref 6–23)
CO2: 40 mEq/L (ref 19–32)
Calcium: 9 mg/dL (ref 8.4–10.5)
Chloride: 92 mEq/L — ABNORMAL LOW (ref 96–112)
Creatinine, Ser: 0.67 mg/dL (ref 0.50–1.10)
GFR calc Af Amer: >90 mL/min (ref >90)
GFR calc non Af Amer: 86 mL/min — ABNORMAL LOW (ref >90)
Glucose, Bld: 78 mg/dL (ref 70–99)
Potassium: 3.9 mEq/L (ref 3.5–5.1)
Sodium: 136 mEq/L (ref 135–145)

## 2012-11-29 LAB — GLUCOSE, CAPILLARY
Glucose-Capillary: 104 mg/dL — ABNORMAL HIGH (ref 70–99)
Glucose-Capillary: 66 mg/dL — ABNORMAL LOW (ref 70–99)

## 2012-11-29 MED ORDER — ENSURE COMPLETE PO LIQD: 237.0000 mL | Freq: Two times a day (BID) | ORAL | Status: DC

## 2012-11-29 MED ORDER — BENZONATATE 200 MG PO CAPS: 200.0000 mg | ORAL_CAPSULE | Freq: Three times a day (TID) | ORAL | Status: DC

## 2012-11-29 MED ORDER — TRAMADOL HCL 50 MG PO TABS: 100.0000 mg | ORAL_TABLET | Freq: Four times a day (QID) | ORAL | Status: DC | PRN

## 2012-11-29 MED ORDER — ALUM & MAG HYDROXIDE-SIMETH 200-200-20 MG/5ML PO SUSP: 30.0000 mL | Freq: Four times a day (QID) | ORAL | Status: DC | PRN

## 2012-11-29 MED ORDER — BUDESONIDE 0.25 MG/2ML IN SUSP: 0.2500 mg | Freq: Four times a day (QID) | RESPIRATORY_TRACT | Status: DC

## 2012-11-29 MED ORDER — ACETAMINOPHEN 500 MG PO TABS: 1000.0000 mg | ORAL_TABLET | Freq: Four times a day (QID) | ORAL | Status: DC | PRN

## 2012-11-29 MED ORDER — ALBUTEROL SULFATE (2.5 MG/3ML) 0.083% IN NEBU: 2.5000 mg | INHALATION_SOLUTION | Freq: Four times a day (QID) | RESPIRATORY_TRACT | Status: DC

## 2012-11-29 MED ORDER — ALPRAZOLAM 0.25 MG PO TABS: 0.2500 mg | ORAL_TABLET | Freq: Every evening | ORAL | Status: DC | PRN

## 2012-11-29 MED ORDER — ALBUTEROL SULFATE (5 MG/ML) 0.5% IN NEBU: 2.5000 mg | INHALATION_SOLUTION | RESPIRATORY_TRACT | Status: DC | PRN

## 2012-11-29 NOTE — Progress Notes (Signed)
OT Cancellation Note  Patient Details Name: ARMINA GALLOWAY MRN: [id number] DOB: October 17, 1941   Cancelled Treatment:    Reason Eval/Treat Not Completed: Other (comment) (should be going to rehab today; wants to wait for therapy)  Lenn Sink, OTR/L [phone] 11/29/2012  11/29/2012, 11:19 AM

## 2012-11-29 NOTE — Discharge Summary (Signed)
Physician Discharge Summary     Patient ID: CODIE HAINER MRN: [id number] DOB/AGE: May 15, 1941 72 y.o.  Admit date: 10/29/2012 Discharge date: 11/29/2012  Admission Diagnoses: AECOPD  Acute respiratory distress.  Discharge Diagnoses:  Principal Problem:  *Respiratory failure, post-operative Active Problems:  Anxiety  Anemia  SBO (small bowel obstruction) s/p LOA 11/07/2012  Emphysema, severe  History of ovarian cancer, s/p remote resection  H/O: hypertension  H/O hyperlipidemia  Leukocytosis  HAP (hospital-acquired pneumonia)  Protein-calorie malnutrition, severe   Significant Hospital tests/ studies/ interventions and procedures  Lines, Tubes, etc:  ETT 1/02 >> 1/03  R IJ CVL 1/02 >> out   Microbiology:  MRSA PCR 1/02 >> NEG  1/3 BC x 2 >>not collected  1/3 UC >> NEG   Antibiotics:  vanco (empiric, fever) 1/3 >> 1/04  Zosyn (empiric, fever) 1/3 >> 1/10   Studies/Events:  12/31 CT abd: Small bowel obstruction with dilated small bowel to the level of the distal ileum in the region of the pelvis where there is abrupt change of caliber which may be related to adhesions  1/2 Hypotension post op >pressor support  1/3 low grade fever/elevated WBC >pan cx  1/9 - ileus continues, n/v with NGT clamped, stable but tenuous respiratory status  Pt profile:  72 y/o wf admitted by [facility] 12/24 with dx of AECOP and treated accordingly with improvement in respiratory symptoms. She developed progressive abdominal distention with KUB and CT abd c/w SBO. Underwent exploratory laparotomy, lysis of adhesions 1/02. Returned to ICU on vent. PCCM asked to assist with vent/ICU [facility] Course:  SBO (small bowel obstruction) - Status post laparotomy 1/1. Post-op course was complicated by Post-op fluid collection/ abscess. s/p perc drain placed by IR on 1/15: cultures negative, and she is off all abx. Perc drain was removed on 1/23 after follow up CT abd/pelvis showing decreased fluid  collection size. Of note that CT showed a new small pelvic collection, however this is not a drainable collection. She is now tolerating oral intake, having BMs and is cleared from a surgical stand-point for d/c.   Post-op ileus  Surgery feels is ready for d/c to rehab. Treated in usual fashion.   Emphysema, severe - No acute bronchospasm or increased wob at rest. Did not require acute treatment after her admission for her acute exacerbation which was treated in the usual fashion which included antibiotics, systemic steroids, bronchodilators and supplemetal oxygen.  Plan:  -aggressive pulmonary hygiene IS/Flutter  -pulmicort, BD's  -d/c to rehab with tessalon pearls.   Anemia - in setting of chronic disease / acute critical illness. Hgb stable currently   Total body overload: more likely d/t diminished protein stores, with more third spaced fluid than too much intravascular fluid. Surgical svc added low diuretic dosing. Watch carefully for contraction alkalosis  Plan: Will d/c off diuretics, would assess this clinically.    Discharge Exam: BP 130/71  Pulse 91  Temp 98.4 F (36.9 C) (Oral)  Resp 16  Ht 5\' 5"  (1.651 m)  Wt 54.3 kg (119 lb 11.4 oz)  BMI 19.92 kg/m2  SpO2 95% SpO2:  96%  96%  98%  95%   2 liters  Gen: wd female, nad.  HEENT: OP clear  Neck: no JVD or LN  Chest: decreased bs, no wheezing or rhonchi  Cardiac: RRR, no MRG  Abd: decreased bs, but present.  Ext: minimal edema or cyanosis  Neuro: a/o x 3 , moves all 4.    Labs at  discharge Lab Results  Component Value Date   CREATININE 0.67 11/29/2012   BUN 11 11/29/2012   NA 136 11/29/2012   K 3.9 11/29/2012   CL 92* 11/29/2012   CO2 40* 11/29/2012   Lab Results  Component Value Date   WBC 7.9 11/28/2012   HGB 9.0* 11/28/2012   HCT 28.7* 11/28/2012   MCV 96.0 11/28/2012   PLT 330 11/28/2012   Lab Results  Component Value Date   ALT 40* 11/28/2012   AST 36 11/28/2012   ALKPHOS 188* 11/28/2012   BILITOT 0.3  11/28/2012   Lab Results  Component Value Date   INR 0.89 11/20/2012   INR 0.9 08/11/2008    Current radiology studies No results found.  Disposition:  snf      Discharge Orders    Future Appointments: Provider: Department: Dept Phone: Center:   12/18/2012 1:30 PM Barbaraann Share, MD [facility] [phone] None     Future Orders Please Complete By Expires   For home use only DME oxygen      Questions: Responses:   Mode or (Route)    Liters per Minute 2   Frequency    Oxygen conserving device    Diet - low sodium heart healthy      Increase activity slowly          Medication List     As of 11/29/2012 11:27 AM    STOP taking these medications         budesonide-formoterol 160-4.5 MCG/ACT inhaler   Commonly known as: SYMBICORT      predniSONE 10 MG tablet   Commonly known as: DELTASONE      TAKE these medications         acetaminophen 500 MG tablet   Commonly known as: TYLENOL   Take 2 tablets (1,000 mg total) by mouth every 6 (six) hours as needed for pain.      albuterol 108 (90 BASE) MCG/ACT inhaler   Commonly known as: PROVENTIL HFA;VENTOLIN HFA   Inhale 2 puffs into the lungs every 6 (six) hours as needed.      albuterol (2.5 MG/3ML) 0.083% nebulizer solution   Commonly known as: PROVENTIL   Take 3 mLs (2.5 mg total) by nebulization 4 (four) times daily. Dx: 492.8      albuterol (5 MG/ML) 0.5% nebulizer solution   Commonly known as: PROVENTIL   Take 0.5 mLs (2.5 mg total) by nebulization every 4 (four) hours as needed for wheezing.      ALPRAZolam 0.25 MG tablet   Commonly known as: XANAX   Take 1 tablet (0.25 mg total) by mouth at bedtime as needed.      alum & mag hydroxide-simeth 200-200-20 MG/5ML suspension   Commonly known as: MAALOX/MYLANTA   Take 30 mLs by mouth every 6 (six) hours as needed (or bloating).      ANTIOXIDANT PO   Take 1 tablet by mouth daily.      aspirin 81 MG tablet   Take 81 mg by mouth daily.       atorvastatin 80 MG tablet   Commonly known as: LIPITOR   Take 80 mg by mouth daily.      BENICAR 40 MG tablet   Generic drug: olmesartan   Take 1 tablet by mouth daily.      benzonatate 200 MG capsule   Commonly known as: TESSALON   Take 1 capsule (200 mg total) by mouth 3 (three) times daily.  budesonide 0.25 MG/2ML nebulizer solution   Commonly known as: PULMICORT   Take 2 mLs (0.25 mg total) by nebulization 4 (four) times daily.      CALTRATE 600 1500 MG Tabs   Generic drug: Calcium Carbonate   Take 1 tablet by mouth 2 (two) times daily.      feeding supplement Liqd   Take 237 mLs by mouth 2 (two) times daily between meals.      ferrous sulfate 325 (65 FE) MG tablet   Take 1 tablet (325 mg total) by mouth 2 (two) times daily with a meal.      Fish Oil 1000 MG Caps   Take 1 capsule by mouth 2 (two) times daily.      Lutein 20 MG Caps   Take 1 capsule by mouth daily.      polyethylene glycol packet   Commonly known as: MIRALAX / GLYCOLAX   Take 17 g by mouth daily as needed.      risedronate 150 MG tablet   Commonly known as: ACTONEL   Take 150 mg by mouth every 30 (thirty) days. with water on empty stomach, nothing by mouth or lie down for next 30 minutes.      senna-docusate 8.6-50 MG per tablet   Commonly known as: Senokot-S   Take 1 tablet by mouth 2 (two) times daily.      sodium chloride 5 % ophthalmic solution   Commonly known as: MURO 128   Place 1 drop into the left eye as needed. Dry eyes      SYSTANE 0.4-0.3 % Soln   Generic drug: Polyethyl Glycol-Propyl Glycol   Apply 1 drop to eye daily as needed. dryness      tiotropium 18 MCG inhalation capsule   Commonly known as: SPIRIVA   Place 1 capsule (18 mcg total) into inhaler and inhale daily.      traMADol 50 MG tablet   Commonly known as: ULTRAM   Take 2 tablets (100 mg total) by mouth every 6 (six) hours as needed.        Follow-up Information    Follow up with Lajean Saver, MD.  Schedule an appointment as soon as possible for a visit in 2 weeks.   Contact information:   [address] [place] [address] [phone]       Follow up with Barbaraann Share, MD. On 12/18/2012. (130pm )    Contact information:   [address] [place] [address] [phone]          Discharged Condition: {fair Physician Statement:   The Patient was personally examined, the discharge assessment and plan has been personally reviewed and I agree with ACNP Otila Starn's assessment and plan. > 30 minutes of time have been dedicated to discharge assessment, planning and discharge instructions.   Signed: Sherill Mangen,PETE 11/29/2012, 11:27 AM

## 2012-11-29 NOTE — Progress Notes (Addendum)
Pt profile: 72 y/o wf admitted by [facility] 12/24 with dx of AECOP and treated accordingly with improvement in respiratory symptoms. She developed progressive abdominal distention with KUB and CT abd c/w SBO. Underwent exploratory laparotomy, lysis of adhesions 1/02. Returned to ICU on vent. PCCM asked to assist with vent/ICU mgmt   Lines, Tubes, etc: ETT 1/02 >> 1/03 R IJ CVL 1/02 >> out  Microbiology: MRSA PCR 1/02 >> NEG 1/3 BC x 2 >>not collected 1/3 UC >> NEG  Antibiotics:  vanco (empiric, fever) 1/3 >> 1/04 Zosyn (empiric, fever) 1/3 >> 1/10    Studies/Events: 12/31 CT abd: Small bowel obstruction with dilated small bowel to the level of the distal ileum in the region of the pelvis where there is abrupt change of caliber which may be related to adhesions 1/2 Hypotension post op >pressor support  1/3 low grade fever/elevated WBC >pan cx  1/9 - ileus continues, n/v with NGT clamped, stable but tenuous respiratory status   Consults:  CCS 12/30  Best Practice: DVT: SQ hep SUP: PPI Nutrition: none Glycemic control: SSI Sedation/analgesia: PRN fentanyl   [name]: she remains stable from a pulmonary standpoint.  No increased wob.  Ready for d/c to rehab today.  Objective:  Filed Vitals:   11/28/12 2116 11/29/12 0221 11/29/12 0642 11/29/12 0855  BP: 127/65  130/71   Pulse: 94  91   Temp: 98.2 F (36.8 C)  98.4 F (36.9 C)   TempSrc: Oral  Oral   Resp: 18  16   Height:      Weight:      SpO2: 96% 96% 98% 95%  2 liters  Gen: wd female, nad.   HEENT: OP clear Neck: no JVD or LN Chest: decreased bs, no wheezing or rhonchi Cardiac: RRR, no MRG Abd: decreased bs, but present.  Ext: minimal edema or cyanosis Neuro: a/o x 3 , moves all 4.   BMET  Lab 11/29/12 0435 11/28/12 0610 11/26/12 0700 11/25/12 0650 11/24/12 0400  NA 136 132* 136 135 134*  K 3.9 3.9 -- -- --  CL 92* 87* 92* 93* 93*  CO2 40* 39* 41* 42* 40*  GLUCOSE 78 81 143* 129* 118*  BUN 11 11 20 20 20     CREATININE 0.67 0.57 0.44* 0.44* 0.42*  CALCIUM 9.0 8.9 9.3 9.3 9.5  MG -- 1.6 -- 1.8 --  PHOS -- 3.4 -- 3.7 --   CBC  Lab 11/28/12 0610 11/26/12 0700 11/25/12 0650  HGB 9.0* 8.8* 6.9*  HCT 28.7* 28.3* 22.3*  WBC 7.9 7.7 7.5  PLT 330 384 405*   PCXR: small bilateral effusions  IMPRESSION:  SBO (small bowel obstruction) - Status post laparotomy per [facility].  NG out.   Post-op fluid collection/ abscess.  s/p perc drain placed by IR on 1/15: cultures negative, and she is off all abx.  Post-op ileus  Surgery feels is ready for d/c to rehab.  Emphysema, severe - No acute bronchospasm or increased wob at rest.   Plan: -aggressive pulmonary hygiene IS/Flutter -pulmicort, BD's -d/c to rehab with tessalon pearls.  Anemia - in setting of chronic disease / acute critical illness.  Hgb stable currently  Total body overload: more likely d/t diminished protein stores, with more third spaced fluid than too much intravascular fluid. Surgical svc added low diuretic dosing. Watch carefully for contraction alkalosis   Plan Closely follow chemistry  Deconditioning  Plan: Likely will need SNF  The pt is stable from a pulmonary standpoint for  discharge. She has been cleared by surgery for discharge.  Will see her back in office in 2 weeks.    Time spent with discharge greater than .

## 2012-11-29 NOTE — Progress Notes (Signed)
Pt refused dressing change today. She stated night nurse did dressing change early this morning, and would rather not get it changed again. Will continue to monitor.

## 2012-11-29 NOTE — Progress Notes (Addendum)
Pt is to be discharged to [facility] today. Report has been called to facility. Pt is in NAD, PICC line will be D/C'd before discharge, all paperwork has been reviewed/discussed with patient, and there are no questions/concerns at this time. Assessment is unchanged from this morning. Pt is to be transported via EMS.

## 2012-11-29 NOTE — Progress Notes (Signed)
Patient ID: [name], female   DOB: 1941-07-14, 72 y.o.   MRN: [id number] 22 Days Post-Op  Subjective: Improving, eating better, ready for rehab today, has had 2 bms today  Objective: Vital signs in last 24 hours: Temp:  [98.2 F (36.8 C)-98.4 F (36.9 C)] 98.4 F (36.9 C) (01/24 0642) Pulse Rate:  [89-94] 91  (01/24 0642) Resp:  [16-18] 16  (01/24 0642) BP: (119-130)/(65-71) 130/71 mmHg (01/24 0642) SpO2:  [96 %-99 %] 98 % (01/24 0642) Last BM Date: 11/28/12   Intake/Output from previous day: 01/23 0701 - 01/24 0700 In: 240 [P.O.:240] Out: 1200 [Urine:1200] Intake/Output this shift: Total I/O In: 360 [P.O.:360] Out: 152 [Urine:150; Stool:2]  Abd: soft, incision well healed, +BS, nontender  Lab Results:   [facility] 11/28/12 0610  WBC 7.9  HGB 9.0*  HCT 28.7*  PLT 330    BMET  Basename 11/29/12 0435 11/28/12 0610  NA 136 132*  K 3.9 3.9  CL 92* 87*  CO2 40* 39*  GLUCOSE 78 81  BUN 11 11  CREATININE 0.67 0.57  CALCIUM 9.0 8.9   PT/INR No results found for this basename: LABPROT:2,INR:2 in the last 72 hours   Lab 11/28/12 0610 11/26/12 0700 11/25/12 0650  AST 36 26 29  ALT 40* 33 31  ALKPHOS 188* 205* 195*  BILITOT 0.3 0.2* 0.2*  PROT 5.0* 5.0* 4.9*  ALBUMIN 1.9* 1.7* 1.7*     Lipase  No results found for this basename: lipase     Studies/Results: No results found.  Medications:    . acetaminophen  1,000 mg Oral TID  . albuterol  2.5 mg Nebulization Q6H  . antiseptic oral rinse  15 mL Mouth Rinse QID  . benzonatate  200 mg Oral TID  . bisacodyl  10 mg Rectal Daily  . bisacodyl  10 mg Rectal Once  . budesonide  0.25 mg Nebulization QID  . calcium carbonate  1 tablet Oral BID  . chlorhexidine  15 mL Mouth Rinse BID  . feeding supplement  237 mL Oral BID BM  . feeding supplement  1 Container Oral TID BM  . furosemide  40 mg Oral Daily  . heparin  5,000 Units Subcutaneous Q8H  . insulin aspart  0-5 Units Subcutaneous QHS  . insulin  aspart  0-9 Units Subcutaneous TID WC  . lip balm  1 application Topical BID  . pantoprazole  40 mg Oral Daily  . polyethylene glycol  17 g Oral Daily  . potassium chloride  40 mEq Oral Daily  . sodium chloride  10-40 mL Intracatheter Q12H  . sodium chloride  3 mL Intravenous Q12H  . tiotropium  18 mcg Inhalation Daily    Assessment/Plan 1. POD#22-Ex lap with LOA(11/07/12 Dr. Derrell Lolling: ileus resolved, +BMs now, ready to go to rehab from surgical standpoint, will need follow up with Dr. Derrell Lolling in 1-2 weeks for post-op check.  Continue PT at rehab, encouraged good PO intake.  WHITE, ELIZABETH 11/29/2012 8:42 AM

## 2012-11-29 NOTE — Progress Notes (Signed)
Agree with above 

## 2012-11-29 NOTE — Progress Notes (Signed)
Clinical Social Worker facilitated pt discharge needs including contacting the facility, faxing pt discharge information via [facility], discussing with pt at bedside, providing RN phone number to call report, and arranging ambulance transportation (PTAR) for pt to [facility]. No further social work needs identified at this time. Clinical Social Worker signing off.   Jacklynn Lewis, MSW, LCSWA  Clinical Social Work [phone]

## 2012-11-29 NOTE — Progress Notes (Signed)
Pt BS was 66, gave OJ, and rechecked BS, it came up to 93. Will continue to monitor.

## 2012-12-03 ENCOUNTER — Encounter (HOSPITAL_COMMUNITY): Payer: Medicare Other

## 2012-12-04 NOTE — Discharge Summary (Signed)
See my note on same day.

## 2012-12-05 ENCOUNTER — Encounter (HOSPITAL_COMMUNITY): Payer: Medicare Other

## 2012-12-10 ENCOUNTER — Encounter (HOSPITAL_COMMUNITY): Payer: Medicare Other

## 2012-12-11 ENCOUNTER — Telehealth (INDEPENDENT_AMBULATORY_CARE_PROVIDER_SITE_OTHER): Payer: Self-pay | Admitting: General Surgery

## 2012-12-11 NOTE — Telephone Encounter (Signed)
LMOM asking patient to call [name] back to see if she could come in at 10:00 for her appt 2/6 to fill in some gaps

## 2012-12-12 ENCOUNTER — Ambulatory Visit (INDEPENDENT_AMBULATORY_CARE_PROVIDER_SITE_OTHER): Payer: Medicare Other | Admitting: General Surgery

## 2012-12-12 ENCOUNTER — Encounter (HOSPITAL_COMMUNITY): Payer: Medicare Other

## 2012-12-12 ENCOUNTER — Encounter (INDEPENDENT_AMBULATORY_CARE_PROVIDER_SITE_OTHER): Payer: Self-pay | Admitting: General Surgery

## 2012-12-12 VITALS — BP 112/70 | HR 107 | Temp 97.6°F | Ht 61.25 in | Wt 90.4 lb

## 2012-12-12 DIAGNOSIS — Z9889 Other specified postprocedural states: Secondary | ICD-10-CM

## 2012-12-12 NOTE — Progress Notes (Signed)
Patient ID: [name], female   DOB: 10/09/1941, 72 y.o.   MRN: [id number] The patient is a 72 year old female status post exploratory laparoscopy, lysis of adhesions for small bowel obstruction. Patient was recently discharged from the hospital to a SNF facility. Patient has a history of COPD and is on home oxygen.  Patient was discharged along ileus but was tolerating her diet her discharge.  The patient has been doing well and her facility. She continued to eat a normal diet had normal bowel function. She will be returning home next week this past PT goals.   On exam: Who is clean dry and intact and well healed. Abdomen soft nontender nondistended with active bowel sounds  Assessment  And plan: The patient is a 72 year old female status post exploratory Laparotomy and lysis of adhesions for small bowel obstruction.  The patient is to continue working with her primary care physician as far as her COPD oxygen levels. Patient return here when necessary for

## 2012-12-13 ENCOUNTER — Telehealth: Payer: Self-pay | Admitting: Pulmonary Disease

## 2012-12-13 DIAGNOSIS — J438 Other emphysema: Secondary | ICD-10-CM

## 2012-12-13 NOTE — Telephone Encounter (Signed)
Called, spoke with pt.  Pt states she will be d/c'd from [facility] on Dec 19, 2012.  Pt states she was asked by [facility] to have KC put in orders for home health PT, OT, and nursing.  She is aware [facility] is out of office until Monday.  She would like orders to be sent on Monday if [facility] is ok with this.  She would like things to be in place when she is d/c'd to home.  Dr. Shelle Iron, are you ok with ordering this for pt?  Pls advise.  Thank you.  ** If pt doesn't answer when calling back, pls leave detailed msg.  Pt will call back if any further questions.

## 2012-12-16 ENCOUNTER — Telehealth: Payer: Self-pay | Admitting: Pulmonary Disease

## 2012-12-16 NOTE — Telephone Encounter (Signed)
Spoke with pt and notified of the recs per [facility] She verbalized understanding understanding She does want to stick with [facility] since she has used them for years Order sent to [facility] for PT with [facility] Nothing further needed per pt

## 2012-12-16 NOTE — Telephone Encounter (Signed)
[id number] x1 

## 2012-12-16 NOTE — Telephone Encounter (Signed)
Melissa calling to let [facility] know they already have an order for this pt to receive services for therapy from Dr. Bufford Lope. This is just an FYI for [facility].

## 2012-12-16 NOTE — Telephone Encounter (Signed)
I haven't seen her since the hospital, nor do I have any records from her rehab stay.  Why does she need all of that if she just got out of rehab.  I am ok with PT, but would not do OT or nursing.  If she has wound care that needs to be done, would consider nursing. Would also like to avoid advanced if possible.

## 2012-12-17 ENCOUNTER — Encounter (HOSPITAL_COMMUNITY): Payer: Medicare Other

## 2012-12-18 ENCOUNTER — Ambulatory Visit: Payer: Medicare Other | Admitting: Pulmonary Disease

## 2012-12-18 ENCOUNTER — Telehealth: Payer: Self-pay | Admitting: Pulmonary Disease

## 2012-12-18 DIAGNOSIS — J438 Other emphysema: Secondary | ICD-10-CM

## 2012-12-18 MED ORDER — BUDESONIDE-FORMOTEROL FUMARATE 160-4.5 MCG/ACT IN AERO: 2.0000 | INHALATION_SPRAY | Freq: Two times a day (BID) | RESPIRATORY_TRACT | Status: DC

## 2012-12-18 MED ORDER — ALBUTEROL SULFATE HFA 108 (90 BASE) MCG/ACT IN AERS: 2.0000 | INHALATION_SPRAY | Freq: Four times a day (QID) | RESPIRATORY_TRACT | Status: DC | PRN

## 2012-12-18 MED ORDER — TIOTROPIUM BROMIDE MONOHYDRATE 18 MCG IN CAPS: 18.0000 ug | ORAL_CAPSULE | Freq: Every day | RESPIRATORY_TRACT | Status: DC

## 2012-12-18 NOTE — Telephone Encounter (Signed)
I spoke with pt and is aware rx has been sent to the pharmacy. Nothing further was needed 

## 2012-12-18 NOTE — Telephone Encounter (Signed)
Ok for her to go back to her regular inhalers, including symbicort.  Can stop budesonide nebs if she goes back on symbicort.

## 2012-12-18 NOTE — Telephone Encounter (Signed)
Pt also cancelled appt today due to pending weather event (this was ok per [facility] as well) but pt insists that she have HFU next week with [facility] (next avail was in march. Hazel Sams

## 2012-12-18 NOTE — Telephone Encounter (Signed)
I spoke with pt and she stated she does not want to have use the pulmicort neb. She wants to go back to symbicort inhaler. She stated that worked fine with her and helped wither her breathing. Although she will do whatever [facility] recommends. Also she stated she will need refills on her proair, spiriva and if [facility] okays the symbicort. Please advise KC thanks

## 2012-12-19 ENCOUNTER — Encounter (HOSPITAL_COMMUNITY): Payer: Medicare Other

## 2012-12-24 ENCOUNTER — Encounter (HOSPITAL_COMMUNITY): Payer: Medicare Other

## 2013-01-02 ENCOUNTER — Encounter (INDEPENDENT_AMBULATORY_CARE_PROVIDER_SITE_OTHER): Payer: Medicare Other | Admitting: General Surgery

## 2013-01-09 ENCOUNTER — Encounter: Payer: Self-pay | Admitting: Pulmonary Disease

## 2013-01-09 ENCOUNTER — Ambulatory Visit (INDEPENDENT_AMBULATORY_CARE_PROVIDER_SITE_OTHER): Payer: Medicare Other | Admitting: Pulmonary Disease

## 2013-01-09 ENCOUNTER — Telehealth: Payer: Self-pay | Admitting: *Deleted

## 2013-01-09 VITALS — BP 108/62 | HR 84 | Temp 98.0°F | Ht 61.5 in | Wt 88.4 lb

## 2013-01-09 DIAGNOSIS — J439 Emphysema, unspecified: Secondary | ICD-10-CM

## 2013-01-09 NOTE — Assessment & Plan Note (Signed)
The patient has known severe COPD, but is doing surprisingly well after her recent hospitalization for a small bowel obstruction complicated by ileus and pneumonia.  She is slowly getting back to baseline, but remains weak.  She is working with physical therapy on strengthening.  She denies any significant cough or congestion.  I have asked her to work on improving her nutritional status, and also her physical therapy.  She is to continue on her current bronchodilator regimen.

## 2013-01-09 NOTE — Progress Notes (Signed)
  Subjective:    Patient ID: [name], female    DOB: 1941-08-31, 72 y.o.   MRN: [id number]  HPI The pt comes in today for f/u of her known severe copd.  She has had a recent lengthy hospitalization for SBO, with ileus postop.  Surprisingly, her breathing held up during that hospital stay.  She has gone to a rehabilitation facility and recovered quite nicely, and she feels that her breathing is at baseline currently.  She denies any chest congestion, cough, or purulent mucus.  She does admit to severe weakness, but is working with physical therapy.   Review of Systems  Constitutional: Negative for fever and unexpected weight change.  HENT: Positive for congestion, rhinorrhea and postnasal drip. Negative for ear pain, nosebleeds, sore throat, sneezing, trouble swallowing, dental problem and sinus pressure.   Eyes: Negative for redness and itching.  Respiratory: Positive for cough ( dry). Negative for chest tightness, shortness of breath and wheezing.   Cardiovascular: Negative for palpitations and leg swelling.  Gastrointestinal: Negative for nausea and vomiting.  Genitourinary: Negative for dysuria.  Musculoskeletal: Negative for joint swelling.  Skin: Negative for rash.  Neurological: Negative for headaches.  Hematological: Does not bruise/bleed easily.  Psychiatric/Behavioral: Negative for dysphoric mood. The patient is not nervous/anxious.        Objective:   Physical Exam Thin female in nad Nose without purulence or discharge noted. Chest with decreased bs, no wheezing Cor with rrr LE without edema, no cyanosis Alert and oriented, moves all 4.        Assessment & Plan:

## 2013-01-09 NOTE — Telephone Encounter (Signed)
Called [employer] PPO plan for [employer]. On neb meds.  [phone] Albuterol Sulfate 0.083% Nebulization  3ML/2.5mg   Take up to 4 times daily prn COPD symptoms/flare. Dx: 57.8  Stressed to the company that this medication is AS NEEDED ONLY and is NOT a maintenance drug.  Insurance company states that this will have to be sent to be reviewed and we will have a response within 72 hours. Letter to be faxed with response. ID CLAIM #[id number]

## 2013-01-09 NOTE — Patient Instructions (Addendum)
No change in medications for your breathing. Keep working on Set designer. followup with me in 6mos if doing well.

## 2013-01-13 ENCOUNTER — Ambulatory Visit: Payer: Medicare Other | Admitting: Pulmonary Disease

## 2013-01-15 ENCOUNTER — Telehealth: Payer: Self-pay | Admitting: Pulmonary Disease

## 2013-01-15 MED ORDER — ALBUTEROL SULFATE (2.5 MG/3ML) 0.083% IN NEBU: 2.5000 mg | INHALATION_SOLUTION | Freq: Four times a day (QID) | RESPIRATORY_TRACT | Status: DC

## 2013-01-15 NOTE — Telephone Encounter (Signed)
Patient has received medication--approved through [employer]. Patient has picked up medication.

## 2013-01-15 NOTE — Telephone Encounter (Signed)
I advised pt RX was written when she was in the hospital back in January and this was sent when she was d/c'd. Pt was upset this was done bc she does not want anything sent by a doctor she does not remember. Pt asked I send an RX for her albuterol neb under KC's name to have on hold for next time she needs refilled. I advised pt will do so. She needed nothing further

## 2013-07-15 ENCOUNTER — Encounter: Payer: Self-pay | Admitting: Pulmonary Disease

## 2013-07-15 ENCOUNTER — Ambulatory Visit (INDEPENDENT_AMBULATORY_CARE_PROVIDER_SITE_OTHER): Payer: Medicare Other | Admitting: Pulmonary Disease

## 2013-07-15 VITALS — BP 130/82 | HR 86 | Temp 97.9°F | Ht 61.5 in | Wt 96.6 lb

## 2013-07-15 DIAGNOSIS — J438 Other emphysema: Secondary | ICD-10-CM

## 2013-07-15 DIAGNOSIS — J439 Emphysema, unspecified: Secondary | ICD-10-CM

## 2013-07-15 MED ORDER — TIOTROPIUM BROMIDE MONOHYDRATE 18 MCG IN CAPS: 18.0000 ug | ORAL_CAPSULE | Freq: Every day | RESPIRATORY_TRACT | Status: DC

## 2013-07-15 MED ORDER — ALBUTEROL SULFATE HFA 108 (90 BASE) MCG/ACT IN AERS: 2.0000 | INHALATION_SPRAY | Freq: Four times a day (QID) | RESPIRATORY_TRACT | Status: DC | PRN

## 2013-07-15 MED ORDER — BENZONATATE 100 MG PO CAPS: 100.0000 mg | ORAL_CAPSULE | Freq: Four times a day (QID) | ORAL | Status: DC | PRN

## 2013-07-15 MED ORDER — BUDESONIDE-FORMOTEROL FUMARATE 160-4.5 MCG/ACT IN AERO: 2.0000 | INHALATION_SPRAY | Freq: Two times a day (BID) | RESPIRATORY_TRACT | Status: DC

## 2013-07-15 NOTE — Addendum Note (Signed)
Addended by: Maisie Fus on: 07/15/2013 05:28 PM   Modules accepted: Orders

## 2013-07-15 NOTE — Assessment & Plan Note (Signed)
The patient is fairly stable from a pulmonary standpoint, and I have asked her to continue on her current bronchodilator regimen and oxygen.  I have also stressed to her the importance of regular exercise in order to maintain her quality of life.

## 2013-07-15 NOTE — Patient Instructions (Addendum)
No change in breathing meds. Can take zyrtec 10mg  (cetirizine) one at bedtime for your postnasal drip/allergies. Will give you the flu shot today Will send in prescriptions for your meds. followup with me in 6mos if doing well.

## 2013-07-15 NOTE — Progress Notes (Signed)
  Subjective:    Patient ID: [name], female    DOB: Mar 16, 1941, 72 y.o.   MRN: [id number]  HPI The patient comes in today for followup of her known severe COPD.  She is staying on her medications compliantly, as well as her oxygen.  She has had one mild flareup in June since her last visit, he was treated with a short prednisone taper by her primary physician.  She has gotten through the summer without a pulmonary infection.  She feels that her exertional tolerance is at baseline currently   Review of Systems  Constitutional: Negative for fever and unexpected weight change.  HENT: Negative for ear pain, nosebleeds, congestion, sore throat, rhinorrhea, sneezing, trouble swallowing, dental problem, postnasal drip and sinus pressure.   Eyes: Negative for redness and itching.  Respiratory: Positive for cough. Negative for chest tightness, shortness of breath and wheezing.   Cardiovascular: Negative for palpitations and leg swelling.  Gastrointestinal: Negative for nausea and vomiting.  Genitourinary: Negative for dysuria.  Musculoskeletal: Negative for joint swelling.  Skin: Negative for rash.  Neurological: Negative for headaches.  Hematological: Does not bruise/bleed easily.  Psychiatric/Behavioral: Negative for dysphoric mood. The patient is not nervous/anxious.        Objective:   Physical Exam Thin female in no acute distress Nose without purulence or discharge noted Neck without lymphadenopathy or thyromegaly Chest with very diminished breath sounds, no active wheezing Cardiac exam with regular rate and rhythm Lower extremities with no significant edema, no cyanosis Alert and oriented, moves all 4 extremities.       Assessment & Plan:

## 2013-10-19 ENCOUNTER — Emergency Department (HOSPITAL_COMMUNITY): Payer: Medicare Other

## 2013-10-19 ENCOUNTER — Encounter (HOSPITAL_COMMUNITY): Payer: Self-pay | Admitting: Emergency Medicine

## 2013-10-19 ENCOUNTER — Inpatient Hospital Stay (HOSPITAL_COMMUNITY)
Admission: EM | Admit: 2013-10-19 | Discharge: 2013-10-24 | DRG: 191 | Disposition: A | Discharge status: Home / Self Care | Payer: Medicare Other | Attending: Internal Medicine | Admitting: Internal Medicine

## 2013-10-19 DIAGNOSIS — J439 Emphysema, unspecified: Secondary | ICD-10-CM

## 2013-10-19 DIAGNOSIS — R0603 Acute respiratory distress: Secondary | ICD-10-CM

## 2013-10-19 DIAGNOSIS — J209 Acute bronchitis, unspecified: Principal | ICD-10-CM | POA: Diagnosis present

## 2013-10-19 DIAGNOSIS — Z87891 Personal history of nicotine dependence: Secondary | ICD-10-CM

## 2013-10-19 DIAGNOSIS — J9610 Chronic respiratory failure, unspecified whether with hypoxia or hypercapnia: Secondary | ICD-10-CM | POA: Diagnosis present

## 2013-10-19 DIAGNOSIS — J4489 Other specified chronic obstructive pulmonary disease: Secondary | ICD-10-CM

## 2013-10-19 DIAGNOSIS — Z9981 Dependence on supplemental oxygen: Secondary | ICD-10-CM

## 2013-10-19 DIAGNOSIS — Z885 Allergy status to narcotic agent status: Secondary | ICD-10-CM

## 2013-10-19 DIAGNOSIS — Z79899 Other long term (current) drug therapy: Secondary | ICD-10-CM

## 2013-10-19 DIAGNOSIS — E785 Hyperlipidemia, unspecified: Secondary | ICD-10-CM | POA: Diagnosis present

## 2013-10-19 DIAGNOSIS — D72829 Elevated white blood cell count, unspecified: Secondary | ICD-10-CM | POA: Diagnosis present

## 2013-10-19 DIAGNOSIS — K5900 Constipation, unspecified: Secondary | ICD-10-CM | POA: Diagnosis present

## 2013-10-19 DIAGNOSIS — J438 Other emphysema: Secondary | ICD-10-CM

## 2013-10-19 DIAGNOSIS — I10 Essential (primary) hypertension: Secondary | ICD-10-CM

## 2013-10-19 DIAGNOSIS — J189 Pneumonia, unspecified organism: Secondary | ICD-10-CM

## 2013-10-19 DIAGNOSIS — J440 Chronic obstructive pulmonary disease with acute lower respiratory infection: Principal | ICD-10-CM | POA: Diagnosis present

## 2013-10-19 DIAGNOSIS — D649 Anemia, unspecified: Secondary | ICD-10-CM

## 2013-10-19 DIAGNOSIS — D638 Anemia in other chronic diseases classified elsewhere: Secondary | ICD-10-CM | POA: Diagnosis present

## 2013-10-19 DIAGNOSIS — Z8543 Personal history of malignant neoplasm of ovary: Secondary | ICD-10-CM

## 2013-10-19 DIAGNOSIS — Z7982 Long term (current) use of aspirin: Secondary | ICD-10-CM

## 2013-10-19 DIAGNOSIS — J449 Chronic obstructive pulmonary disease, unspecified: Secondary | ICD-10-CM

## 2013-10-19 DIAGNOSIS — M810 Age-related osteoporosis without current pathological fracture: Secondary | ICD-10-CM | POA: Diagnosis present

## 2013-10-19 LAB — CBC WITH DIFFERENTIAL/PLATELET
Basophils Absolute: 0 K/uL (ref 0.0–0.1)
Basophils Relative: 0 % (ref 0–1)
Eosinophils Absolute: 0 K/uL (ref 0.0–0.7)
Eosinophils Relative: 0 % (ref 0–5)
HCT: 35.2 % — ABNORMAL LOW (ref 36.0–46.0)
Hemoglobin: 11.7 g/dL — ABNORMAL LOW (ref 12.0–15.0)
Lymphocytes Relative: 5 % — ABNORMAL LOW (ref 12–46)
Lymphs Abs: 0.8 K/uL (ref 0.7–4.0)
MCH: 32.5 pg (ref 26.0–34.0)
MCHC: 33.2 g/dL (ref 30.0–36.0)
MCV: 97.8 fL (ref 78.0–100.0)
Monocytes Absolute: 0.8 10*3/uL (ref 0.1–1.0)
Monocytes Relative: 5 % (ref 3–12)
Neutro Abs: 15.4 K/uL — ABNORMAL HIGH (ref 1.7–7.7)
Neutrophils Relative %: 90 % — ABNORMAL HIGH (ref 43–77)
Platelets: 220 K/uL (ref 150–400)
RBC: 3.6 MIL/uL — ABNORMAL LOW (ref 3.87–5.11)
RDW: 13.2 % (ref 11.5–15.5)
WBC: 17.1 K/uL — ABNORMAL HIGH (ref 4.0–10.5)

## 2013-10-19 LAB — BLOOD GAS, VENOUS
Acid-Base Excess: 8.1 mmol/L — ABNORMAL HIGH (ref 0.0–2.0)
Bicarbonate: 34 mEq/L — ABNORMAL HIGH (ref 20.0–24.0)
Drawn by: 331471
O2 Saturation: 79.2 %
Patient temperature: 98.6
TCO2: 31.1 mmol/L (ref 0–100)
pCO2, Ven: 55.7 mmHg — ABNORMAL HIGH (ref 45.0–50.0)
pH, Ven: 7.403 — ABNORMAL HIGH (ref 7.250–7.300)
pO2, Ven: 45.2 mmHg — ABNORMAL HIGH (ref 30.0–45.0)

## 2013-10-19 LAB — BASIC METABOLIC PANEL
BUN: 21 mg/dL (ref 6–23)
CO2: 31 mEq/L (ref 19–32)
Chloride: 94 mEq/L — ABNORMAL LOW (ref 96–112)
Creatinine, Ser: 0.63 mg/dL (ref 0.50–1.10)
GFR calc Af Amer: >90 mL/min (ref >90)
GFR calc non Af Amer: 87 mL/min — ABNORMAL LOW (ref >90)
Glucose, Bld: 164 mg/dL — ABNORMAL HIGH (ref 70–99)

## 2013-10-19 LAB — BASIC METABOLIC PANEL WITH GFR
Calcium: 10.5 mg/dL (ref 8.4–10.5)
Potassium: 4 meq/L (ref 3.5–5.1)
Sodium: 137 meq/L (ref 135–145)

## 2013-10-19 LAB — EXPECTORATED SPUTUM ASSESSMENT W GRAM STAIN, RFLX TO RESP C

## 2013-10-19 LAB — STREP PNEUMONIAE URINARY ANTIGEN: Strep Pneumo Urinary Antigen: NEGATIVE

## 2013-10-19 MED ORDER — ENOXAPARIN SODIUM 40 MG/0.4ML ~~LOC~~ SOLN
40.0000 mg | SUBCUTANEOUS | Status: DC
  Administered 2013-10-19 – 2013-10-20 (×2): 40 mg via SUBCUTANEOUS
  Filled 2013-10-19 (×3): qty 0.4

## 2013-10-19 MED ORDER — DEXTROSE 5 % IV SOLN
1.0000 g | INTRAVENOUS | Status: DC
  Administered 2013-10-19 – 2013-10-22 (×4): 1 g via INTRAVENOUS
  Filled 2013-10-19 (×5): qty 10

## 2013-10-19 MED ORDER — AZITHROMYCIN 500 MG PO TABS
500.0000 mg | ORAL_TABLET | ORAL | Status: DC
  Administered 2013-10-19 – 2013-10-23 (×5): 500 mg via ORAL
  Filled 2013-10-19 (×5): qty 1

## 2013-10-19 MED ORDER — ALBUTEROL SULFATE (5 MG/ML) 0.5% IN NEBU
2.5000 mg | INHALATION_SOLUTION | RESPIRATORY_TRACT | Status: DC | PRN
  Administered 2013-10-20 – 2013-10-21 (×2): 2.5 mg via RESPIRATORY_TRACT
  Filled 2013-10-19 (×2): qty 0.5

## 2013-10-19 MED ORDER — ALBUTEROL SULFATE (5 MG/ML) 0.5% IN NEBU
2.5000 mg | INHALATION_SOLUTION | Freq: Four times a day (QID) | RESPIRATORY_TRACT | Status: DC
  Administered 2013-10-19 – 2013-10-20 (×6): 2.5 mg via RESPIRATORY_TRACT
  Filled 2013-10-19 (×4): qty 0.5

## 2013-10-19 MED ORDER — IRBESARTAN 300 MG PO TABS
300.0000 mg | ORAL_TABLET | Freq: Every day | ORAL | Status: DC
  Administered 2013-10-19 – 2013-10-24 (×6): 300 mg via ORAL
  Filled 2013-10-19 (×6): qty 1

## 2013-10-19 MED ORDER — POLYETHYL GLYCOL-PROPYL GLYCOL 0.4-0.3 % OP SOLN: 1.0000 [drp] | Freq: Every day | OPHTHALMIC | Status: DC | PRN

## 2013-10-19 MED ORDER — IPRATROPIUM BROMIDE 0.02 % IN SOLN
0.5000 mg | Freq: Four times a day (QID) | RESPIRATORY_TRACT | Status: DC
  Administered 2013-10-19 – 2013-10-24 (×18): 0.5 mg via RESPIRATORY_TRACT
  Filled 2013-10-19 (×16): qty 2.5

## 2013-10-19 MED ORDER — METHYLPREDNISOLONE SODIUM SUCC 125 MG IJ SOLR
60.0000 mg | Freq: Once | INTRAMUSCULAR | Status: AC
  Administered 2013-10-19: 60 mg via INTRAVENOUS
  Filled 2013-10-19: qty 2

## 2013-10-19 MED ORDER — METHYLPREDNISOLONE SODIUM SUCC 125 MG IJ SOLR
60.0000 mg | Freq: Four times a day (QID) | INTRAMUSCULAR | Status: DC
  Administered 2013-10-19 – 2013-10-21 (×8): 60 mg via INTRAVENOUS
  Filled 2013-10-19 (×12): qty 0.96

## 2013-10-19 MED ORDER — POLYVINYL ALCOHOL 1.4 % OP SOLN
1.0000 [drp] | OPHTHALMIC | Status: DC | PRN
  Filled 2013-10-19: qty 15

## 2013-10-19 MED ORDER — ALBUTEROL SULFATE (5 MG/ML) 0.5% IN NEBU
5.0000 mg | INHALATION_SOLUTION | Freq: Once | RESPIRATORY_TRACT | Status: AC
  Administered 2013-10-19: 5 mg via RESPIRATORY_TRACT
  Filled 2013-10-19: qty 1

## 2013-10-19 MED ORDER — BUDESONIDE-FORMOTEROL FUMARATE 160-4.5 MCG/ACT IN AERO
2.0000 | INHALATION_SPRAY | Freq: Two times a day (BID) | RESPIRATORY_TRACT | Status: DC
  Filled 2013-10-19: qty 6

## 2013-10-19 MED ORDER — IPRATROPIUM BROMIDE 0.02 % IN SOLN
0.5000 mg | Freq: Once | RESPIRATORY_TRACT | Status: AC
  Administered 2013-10-19: 0.5 mg via RESPIRATORY_TRACT
  Filled 2013-10-19: qty 2.5

## 2013-10-19 MED ORDER — IPRATROPIUM BROMIDE 0.02 % IN SOLN
0.5000 mg | RESPIRATORY_TRACT | Status: DC | PRN
  Administered 2013-10-20 – 2013-10-21 (×2): 0.5 mg via RESPIRATORY_TRACT
  Filled 2013-10-19 (×2): qty 2.5

## 2013-10-19 MED ORDER — ATORVASTATIN CALCIUM 80 MG PO TABS
80.0000 mg | ORAL_TABLET | Freq: Every day | ORAL | Status: DC
  Administered 2013-10-19 – 2013-10-23 (×5): 80 mg via ORAL
  Filled 2013-10-19 (×6): qty 1

## 2013-10-19 MED ORDER — ASPIRIN 81 MG PO TABS
81.0000 mg | ORAL_TABLET | Freq: Every day | ORAL | Status: DC
  Administered 2013-10-19 – 2013-10-23 (×5): 81 mg via ORAL
  Filled 2013-10-19 (×6): qty 1

## 2013-10-19 MED ORDER — SODIUM CHLORIDE 0.9 % IV SOLN
INTRAVENOUS | Status: DC
  Administered 2013-10-19: 16:00:00 via INTRAVENOUS

## 2013-10-19 MED ORDER — TIOTROPIUM BROMIDE MONOHYDRATE 18 MCG IN CAPS
18.0000 ug | ORAL_CAPSULE | Freq: Every day | RESPIRATORY_TRACT | Status: DC
  Filled 2013-10-19: qty 5

## 2013-10-19 MED ORDER — ALPRAZOLAM 0.25 MG PO TABS
0.2500 mg | ORAL_TABLET | Freq: Every evening | ORAL | Status: DC | PRN
  Administered 2013-10-19 – 2013-10-20 (×2): 0.25 mg via ORAL
  Filled 2013-10-19 (×2): qty 1

## 2013-10-19 MED ORDER — ALBUTEROL SULFATE (5 MG/ML) 0.5% IN NEBU
5.0000 mg | INHALATION_SOLUTION | Freq: Once | RESPIRATORY_TRACT | Status: AC
Start: 2013-10-19 — End: 2013-10-19
  Administered 2013-10-19: 5 mg via RESPIRATORY_TRACT
  Filled 2013-10-19: qty 1

## 2013-10-19 MED ORDER — SODIUM CHLORIDE (HYPERTONIC) 5 % OP SOLN
1.0000 [drp] | OPHTHALMIC | Status: DC | PRN
  Filled 2013-10-19: qty 15

## 2013-10-19 NOTE — ED Notes (Addendum)
Pr from home and referred here by [facility] Physician office c/o shortness of breath x 2 days.  CXR performed yesterday and was negative. Lung sound diminished throughout. She has history of COPD and is on 2 L O2 at home. She has had a cough x 2 days coughing up yellow phlegm.

## 2013-10-19 NOTE — Progress Notes (Signed)
Clinical Social Work Department BRIEF PSYCHOSOCIAL ASSESSMENT 10/19/2013  [name], [name]     Account Number:  [id number]     Admit date:  10/19/2013  Clinical Social Worker:  Mariann Laster  Date/Time:  10/19/2013 02:41 PM  Referred by:  CSW  Date Referred:  10/19/2013 Referred for  Psychosocial assessment   Other Referral:   Interview type:  Patient Other interview type:   CSW met with pt and pt's friend, [name].    PSYCHOSOCIAL DATA Living Status:  ALONE Admitted from facility:   Level of care:   Primary support name:  [name] Primary support relationship to patient:  FRIEND Degree of support available:   strong. [name] was present during interview and helped pt provide information to CSW. [name] confirmed that she and pt's other female friends in area are quite supportive of pt.    CURRENT CONCERNS Current Concerns  Adjustment to Illness  Post-Acute Placement   Other Concerns:   States she wants to remain living alone, if possible, and she wants to find out what is wrong.    SOCIAL WORK ASSESSMENT / PLAN CSW met with pt and pt's friend, [name], at bedside.     Pt reports she lives alone in a single-level townhouse with one step to reach entrance. When asked who her primary supports were, pt responded, "Well, let's see. I have Dennie Maizes, Mal Misty." Pt reports that these women are her close friends and supports. [name] was present in room with patient and confirmed this.  Pt reports that she has siblings and neices and nephews in [place], about 4.5 hours away.    Pt had trouble participating in interview due to SOB. She also spoke nervously, and expressed concern as to why a Child psychotherapist needed to see her. CSW provided supportive counseling.    While pt was receiving O2 treatment, [name] volunteered  that pt was been hospitalized for 1.5 months last year for respiratory issues. 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    Plan: Pt to be  admitted to floor.   Assessment/plan status:  Psychosocial Support/Ongoing Assessment of Needs Other assessment/ plan:   Information/referral to community resources:   none at this time    PATIENT'S/FAMILY'S RESPONSE TO PLAN OF CARE: Pt was talking quickly and nervously. Pt stated she was confused as to why a Child psychotherapist needed to see her. Pt stated she agreed with plan to move up to medical floor. [name] thanked CSW for her time.     York Spaniel Ross, [phone]     ED CSW  3:13pm

## 2013-10-19 NOTE — ED Notes (Signed)
MD at bedside. 

## 2013-10-19 NOTE — ED Notes (Signed)
Bed: WA10 Expected date:  Expected time:  Means of arrival:  Comments: 

## 2013-10-19 NOTE — ED Notes (Signed)
Respiratory at bedside.

## 2013-10-19 NOTE — H&P (Signed)
[facility] History and Physical  JILLIAM BELLMORE ZOX:[id number] DOB: July 20, 1941 DOA: 10/19/2013  Referring physician: ED physician PCP: Mickie Hillier, MD   Chief Complaint: shortness of breath   HPI:  Pt is 72 yo female who presented to [facility] ED with main concern of 3 days duration of progressively worsening shortness of breath that initially started with exertion and now present at rest as well, associated with subjective fevers, chills, productive cough of yellow sputum, no specific aggravating or alleviating factors. Pt reports similar events in the past due to COPD flare ups. She denies any specific abdominal or urinary concerns, no headaches, no focal neurological symptoms.   In ED, pt hemodynamically stable with wheezing and mild hypoxia on RA, improved and well responding to oxygen and albuterol nebulizer. TR[facility] asked to admit for further evaluation and management of COPD exacerbation.   Assessment and Plan:  Principal Problem:   COPD (chronic obstructive pulmonary disease) - will admit to telemetry bed - place on oxygen, solumedrol, BD's scheduled and as needed - place on empiric ABX - sputum analysis pending  Active Problems:   Leukocytosis - secondary to acute COPD/bronchitis - will place on empiric ABX and will obtain sputum analysis  - CBC in AM   HTN (hypertension) - stable on admission - continue Benicar    HLD (hyperlipidemia) - continue statin    Anemia of chronic disease - Hg/Hct stable and at pt's baseline - CBC in AM  Code Status: Full Family Communication: Pt at bedside Disposition Plan: Admit to telemetry bed   Review of Systems:  Constitutional: Negative for diaphoresis.  HENT: Negative for hearing loss, ear pain, nosebleeds, congestion, sore throat, neck pain, tinnitus and ear discharge.   Eyes: Negative for blurred vision, double vision, photophobia, pain, discharge and redness.  Respiratory: Negative for hemoptysis and stridor.    Cardiovascular: Negative for chest pain, palpitations, orthopnea, claudication and leg swelling.  Gastrointestinal: Negative for nausea, vomiting and abdominal pain. Negative for heartburn, constipation, blood in stool and melena.  Genitourinary: Negative for dysuria, urgency, frequency, hematuria and flank pain.  Musculoskeletal: Negative for myalgias, back pain, joint pain and falls.  Skin: Negative for itching and rash.  Neurological: Negative for tingling, tremors, sensory change, speech change, focal weakness, loss of consciousness and headaches.  Endo/Heme/Allergies: Negative for environmental allergies and polydipsia. Does not bruise/bleed easily.  Psychiatric/Behavioral: Negative for suicidal ideas. The patient is not nervous/anxious.      Past Medical History  Diagnosis Date  . Emphysema   . 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 Hyperlipidemia   . Hypertension   . Blood transfusion without reported diagnosis   . Emphysema of lung   . Osteoporosis     Past Surgical History  Procedure Laterality Date  . Abdominal hysterectomy    . Laparoscopy  11/07/2012    Procedure: LAPAROSCOPY DIAGNOSTIC;  Surgeon: Axel Filler, MD;  Location: [facility];  Service: General;  Laterality: N/A;  . Laparotomy  11/07/2012    Procedure: EXPLORATORY LAPAROTOMY;  Surgeon: Axel Filler, MD;  Location: [facility];  Service: General;  Laterality: N/A;  . Laparoscopic lysis of adhesions  11/07/2012    Procedure: LAPAROSCOPIC LYSIS OF ADHESIONS;  Surgeon: Axel Filler, MD;  Location: [facility];  Service: General;  Laterality: N/A;  . Lysis of adhesion  11/07/2012    Procedure: LYSIS OF ADHESION;  Surgeon: Axel Filler, MD;  Location: [facility];  Service: General;;    Social History:  reports that she quit smoking about  20 years ago. Her smoking use included Cigarettes. She has a 30 pack-year smoking history. She has never used smokeless tobacco. She reports that she does not drink alcohol or use illicit  drugs.  Allergies  Allergen Reactions  . Oxycodone-Acetaminophen     REACTION: headaches,nausea    Family History  Problem Relation Age of Onset  . Heart failure Mother   . Heart attack Father   . Heart attack Paternal Grandmother     Medication Sig  albuterol (PROAIR HFA) 108 (90 BASE) MCG/ACT inhaler Inhale 2 puffs into the lungs every 6 (six) hours as needed.  albuterol (PROVENTIL) (2.5 MG/3ML) 0.083% nebulizer solution Take 3 mLs (2.5 mg total) by nebulization 4 (four) times daily. Dx: 492.8  ALPRAZolam (XANAX) 0.25 MG tablet Take 1 tablet (0.25 mg total) by mouth at bedtime as needed.  aspirin 81 MG tablet Take 81 mg by mouth daily.    atorvastatin (LIPITOR) 80 MG tablet Take 80 mg by mouth daily.    BENICAR 40 MG tablet Take 1 tablet by mouth daily.  benzonatate (TESSALON) 100 MG capsule Take 1-2 capsules (100-200 mg total) by mouth every 6 (six) hours as needed for cough.  budesonide-formoterol (SYMBICORT) 160-4.5 MCG/ACT inhaler Inhale 2 puffs into the lungs 2 (two) times daily.  Calcium Carbonate (CALTRATE 600) 1500 MG TABS Take 1 tablet by mouth 2 (two) times daily.    Lutein 20 MG CAPS Take 1 capsule by mouth daily.    Multiple Vitamin (ANTIOXIDANT PO) Take 1 tablet by mouth daily.    Omega-3 Fatty Acids (FISH OIL) 1000 MG CAPS Take 1 capsule by mouth 2 (two) times daily.    Polyethyl Glycol-Propyl Glycol (SYSTANE) 0.4-0.3 % SOLN Apply 1 drop to eye daily as needed. dryness  risedronate (ACTONEL) 150 MG tablet Take 150 mg by mouth every 30 (thirty) days. with water on empty stomach, nothing by mouth or lie down for next 30 minutes.   sodium chloride (MURO 128) 5 % ophthalmic solution Place 1 drop into the left eye as needed. Dry eyes  tiotropium (SPIRIVA) 18 MCG inhalation capsule Place 1 capsule (18 mcg total) into inhaler and inhale daily.    Physical Exam: Filed Vitals:   10/19/13 1119 10/19/13 1120 10/19/13 1147  BP:  162/85   Pulse:  107   Temp: 98.4 F (36.9  C)    TempSrc: Oral    Resp:  22   Weight:  43.999 kg (97 lb)   SpO2:  94% 95%    Physical Exam  Constitutional: Appears well-developed and well-nourished. No distress.  HENT: Normocephalic. External right and left ear normal. Oropharynx is clear and moist.  Eyes: Conjunctivae and EOM are normal. PERRLA, no scleral icterus.  Neck: Normal ROM. Neck supple. No JVD. No tracheal deviation. No thyromegaly.  CVS: RRR, S1/S2 +, no murmurs, no gallops, no carotid bruit.  Pulmonary: Diminished breath sounds at bases, expiratory wheezing with mild bibasilar rhonchi  Abdominal: Soft. BS +,  no distension, tenderness, rebound or guarding.  Musculoskeletal: Normal range of motion. No edema and no tenderness.  Lymphadenopathy: No lymphadenopathy noted, cervical, inguinal. Neuro: Alert. Normal reflexes, muscle tone coordination. No cranial nerve deficit. Skin: Skin is warm and dry. No rash noted. Not diaphoretic. No erythema. No pallor.  Psychiatric: Normal mood and affect. Behavior, judgment, thought content normal.   Labs on Admission:  Basic Metabolic Panel:  Recent Labs Lab 10/19/13 1131  NA 137  K 4.0  CL 94*  CO2 31  GLUCOSE  164*  BUN 21  CREATININE 0.63  CALCIUM 10.5   CBC:  Recent Labs Lab 10/19/13 1131  WBC 17.1*  NEUTROABS 15.4*  HGB 11.7*  HCT 35.2*  MCV 97.8  PLT 220   Radiological Exams on Admission: Dg Chest 2 View  10/19/2013   CLINICAL DATA:  COPD exacerbation with cough, wheezing, and shortness of breath.  EXAM: CHEST  2 VIEW  COMPARISON:  12/13/ 2014, 11/27/2012, 10/29/2012.  FINDINGS: Emphysematous changes throughout both lungs, particularly upper lobes, with associated hyperinflation. Moderate central peribronchial thickening, more so than on the 2013 examination, similar to yesterday's examination. Lungs otherwise clear. No localized airspace consolidation. No pleural effusions. No pneumothorax. Normal pulmonary vascularity. Cardiac silhouette normal in size.  Thoracic aorta atherosclerotic, unchanged. Enlarged left main pulmonary artery, slightly more dilated than on the 2013 examination.  IMPRESSION: 1. Mild to moderate changes of acute bronchitis and/or asthma superimposed upon COPD/emphysema. No localized airspace pneumonia. 2. Enlarged left main pulmonary artery. This is nonspecific but can be seen in patients with aortic stenosis. Clinical correlation.   Electronically Signed   By: Hulan Saas M.D.   On: 10/19/2013 12:13    EKG: Normal sinus rhythm, no ST/T wave changes  Debbora Presto, MD  [facility] Pager [phone]  If 7PM-7AM, please contact night-coverage [email or web address] Password [email or web address] 10/19/2013, 1:44 PM

## 2013-10-19 NOTE — ED Notes (Signed)
Patient transported to X-ray 

## 2013-10-19 NOTE — ED Provider Notes (Signed)
CSN: [id number]     Arrival date & time 10/19/13  1104 History   First MD Initiated Contact with Patient 10/19/13 1115     Chief Complaint  Patient presents with  . Shortness of Breath   (Consider location/radiation/quality/duration/timing/severity/associated sxs/prior Treatment) Patient is a 72 y.o. female presenting with shortness of breath. The history is provided by the patient.  Shortness of Breath Severity:  Severe Duration:  3 days Timing:  Constant Progression:  Worsening Chronicity:  Chronic Context: weather changes   Context: not known allergens, not smoke exposure and not URI   Relieved by:  Nothing Worsened by:  Exertion and activity Ineffective treatments:  Inhaler and oxygen Associated symptoms: chest pain, cough and sputum production   Associated symptoms: no abdominal pain, no diaphoresis, no fever, no neck pain and no vomiting   Chest pain:    Quality:  Sharp   Severity:  Mild   Timing:  Intermittent Cough:    Cough characteristics:  Productive   Sputum characteristics:  Yellow   Severity:  Severe   Onset quality:  Gradual   Duration:  3 days   Progression:  Worsening   Chronicity:  Chronic Risk factors: no recent alcohol use and no obesity     Past Medical History  Diagnosis Date  . Emphysema   . 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 Hyperlipidemia   . Hypertension   . Blood transfusion without reported diagnosis   . Emphysema of lung   . Osteoporosis    Past Surgical History  Procedure Laterality Date  . Abdominal hysterectomy    . Laparoscopy  11/07/2012    Procedure: LAPAROSCOPY DIAGNOSTIC;  Surgeon: Axel Filler, MD;  Location: [facility];  Service: General;  Laterality: N/A;  . Laparotomy  11/07/2012    Procedure: EXPLORATORY LAPAROTOMY;  Surgeon: Axel Filler, MD;  Location: [facility];  Service: General;  Laterality: N/A;  . Laparoscopic lysis of adhesions  11/07/2012    Procedure: LAPAROSCOPIC LYSIS OF ADHESIONS;  Surgeon: Axel Filler, MD;  Location:  [facility];  Service: General;  Laterality: N/A;  . Lysis of adhesion  11/07/2012    Procedure: LYSIS OF ADHESION;  Surgeon: Axel Filler, MD;  Location: [facility];  Service: General;;   Family History  Problem Relation Age of Onset  . Heart failure Mother   . Heart attack Father   . Heart attack Paternal Grandmother    History  Substance Use Topics  . Smoking status: Former Smoker -- 1.00 packs/day for 30 years    Types: Cigarettes    Quit date: 11/06/1992  . Smokeless tobacco: Never Used  . Alcohol Use: No   OB History   Grav Para Term Preterm Abortions TAB SAB Ect Mult Living                 Review of Systems  Constitutional: Positive for fatigue. Negative for fever, diaphoresis, activity change and appetite change.  HENT: Negative for congestion and postnasal drip.   Respiratory: Positive for cough, sputum production and shortness of breath.   Cardiovascular: Positive for chest pain.  Gastrointestinal: Negative for nausea, vomiting, abdominal pain and diarrhea.  Musculoskeletal: Negative for myalgias, neck pain and neck stiffness.  All other systems reviewed and are negative.    Allergies  Oxycodone-acetaminophen  Home Medications   No current outpatient prescriptions on file. BP 145/69  Pulse 65  Temp(Src) 97.7 F (36.5 C) (Oral)  Resp 16  Ht 5\' 1"  (1.549 m)  Wt 97 lb (43.999  kg)  BMI 18.34 kg/m2  SpO2 98% Physical Exam  Nursing note and vitals reviewed. Constitutional: She is oriented to person, place, and time. She appears well-developed. She appears cachectic. She is cooperative.  Non-toxic appearance. She does not have a sickly appearance. She appears ill. No distress.  HENT:  Head: Normocephalic and atraumatic.  Eyes: EOM are normal.  Neck: Normal range of motion. Neck supple.  Cardiovascular: Normal rate and regular rhythm.   Pulmonary/Chest: Accessory muscle usage present. Tachypnea noted. She has decreased breath sounds. She has no wheezes. She has no  rhonchi. She has no rales.  Abdominal: Soft. She exhibits no distension. There is no tenderness. There is no rebound.  Neurological: She is alert and oriented to person, place, and time. She exhibits normal muscle tone. Coordination normal.  Skin: Skin is warm and dry. No rash noted. She is not diaphoretic.  Psychiatric: She has a normal mood and affect.    ED Course  Procedures (including critical care time) Labs Review Labs Reviewed  CBC WITH DIFFERENTIAL - Abnormal; Notable for the following:    WBC 17.1 (*)    RBC 3.60 (*)    Hemoglobin 11.7 (*)    HCT 35.2 (*)    Neutrophils Relative % 90 (*)    Neutro Abs 15.4 (*)    Lymphocytes Relative 5 (*)    All other components within normal limits  BASIC METABOLIC PANEL - Abnormal; Notable for the following:    Chloride 94 (*)    Glucose, Bld 164 (*)    GFR calc non Af Amer 87 (*)    All other components within normal limits  BLOOD GAS, VENOUS - Abnormal; Notable for the following:    pH, Ven 7.403 (*)    pCO2, Ven 55.7 (*)    pO2, Ven 45.2 (*)    Bicarbonate 34.0 (*)    Acid-Base Excess 8.1 (*)    All other components within normal limits  BASIC METABOLIC PANEL - Abnormal; Notable for the following:    Glucose, Bld 162 (*)    GFR calc non Af Amer 86 (*)    All other components within normal limits  CBC - Abnormal; Notable for the following:    WBC 10.8 (*)    RBC 3.13 (*)    Hemoglobin 10.0 (*)    HCT 30.9 (*)    All other components within normal limits  CBC - Abnormal; Notable for the following:    WBC 11.8 (*)    RBC 3.33 (*)    Hemoglobin 10.8 (*)    HCT 32.4 (*)    All other components within normal limits  BASIC METABOLIC PANEL - Abnormal; Notable for the following:    Glucose, Bld 140 (*)    GFR calc non Af Amer 85 (*)    All other components within normal limits  CBC - Abnormal; Notable for the following:    RBC 3.19 (*)    Hemoglobin 10.2 (*)    HCT 31.6 (*)    All other components within normal limits   BASIC METABOLIC PANEL - Abnormal; Notable for the following:    Glucose, Bld 131 (*)    BUN 24 (*)    GFR calc non Af Amer 86 (*)    All other components within normal limits  CULTURE, BLOOD (ROUTINE X 2)  CULTURE, BLOOD (ROUTINE X 2)  CULTURE, EXPECTORATED SPUTUM-ASSESSMENT  RESPIRATORY VIRUS PANEL  CULTURE, RESPIRATORY (NON-EXPECTORATED)  GRAM STAIN  HIV ANTIBODY (ROUTINE TESTING)  LEGIONELLA ANTIGEN, URINE  STREP PNEUMONIAE URINARY ANTIGEN   Imaging Review No results found.  EKG Interpretation    Date/Time:    Ventricular Rate:    PR Interval:    QRS Duration:   QT Interval:    QTC Calculation:   R Axis:     Text Interpretation:            O2 sat on 2 L [place] is 94% which I interpret to be adequate   1:53 PM Pt felt slightly improved after first neb.  Will give a second and discuss with Dr. Izola Price with hospitalist service regarding admission.       MDM         1. Bronchitis with chronic airway obstruction   2. Respiratory distress              Pt is failing outpt therapy for COPD.  Pt with acute bronchitis on top of chronic COPD.  CXR here shows no new infiltrates.  WBC is up, but pt has been on oral steroids for 3 days.  Will give nebs, obtain VBG, call for admission for continued COPD exacerbation and SOB.      Gavin Pound. Lilley Hubble, MD 10/23/13 [phone]

## 2013-10-20 ENCOUNTER — Telehealth: Payer: Self-pay | Admitting: Pulmonary Disease

## 2013-10-20 DIAGNOSIS — D638 Anemia in other chronic diseases classified elsewhere: Secondary | ICD-10-CM

## 2013-10-20 LAB — BASIC METABOLIC PANEL
BUN: 18 mg/dL (ref 6–23)
CO2: 32 mEq/L (ref 19–32)
Calcium: 9.8 mg/dL (ref 8.4–10.5)
Creatinine, Ser: 0.67 mg/dL (ref 0.50–1.10)
GFR calc Af Amer: >90 mL/min (ref >90)
GFR calc non Af Amer: 86 mL/min — ABNORMAL LOW (ref >90)
Glucose, Bld: 162 mg/dL — ABNORMAL HIGH (ref 70–99)
Potassium: 4.7 mEq/L (ref 3.5–5.1)
Sodium: 140 mEq/L (ref 135–145)

## 2013-10-20 LAB — LEGIONELLA ANTIGEN, URINE: Legionella Antigen, Urine: NEGATIVE

## 2013-10-20 LAB — CBC
Hemoglobin: 10 g/dL — ABNORMAL LOW (ref 12.0–15.0)
MCH: 31.9 pg (ref 26.0–34.0)
MCHC: 32.4 g/dL (ref 30.0–36.0)
MCV: 98.7 fL (ref 78.0–100.0)
RBC: 3.13 MIL/uL — ABNORMAL LOW (ref 3.87–5.11)
RDW: 13.3 % (ref 11.5–15.5)

## 2013-10-20 LAB — HIV ANTIBODY (ROUTINE TESTING W REFLEX): HIV: NONREACTIVE

## 2013-10-20 MED ORDER — GUAIFENESIN ER 600 MG PO TB12
600.0000 mg | ORAL_TABLET | Freq: Two times a day (BID) | ORAL | Status: DC
  Administered 2013-10-20 – 2013-10-24 (×8): 600 mg via ORAL
  Filled 2013-10-20 (×9): qty 1

## 2013-10-20 MED ORDER — POLYETHYLENE GLYCOL 3350 17 G PO PACK
17.0000 g | PACK | Freq: Every day | ORAL | Status: DC | PRN
  Administered 2013-10-20 – 2013-10-22 (×3): 17 g via ORAL
  Filled 2013-10-20 (×3): qty 1

## 2013-10-20 NOTE — Progress Notes (Signed)
Patient ambulated in hall way. Patient ambulated approximately 300 ft. Patients O2 stat was at 82 % on 2 L's after ambulation. After resting, O2 was 95 % on 2 L's. Will continue to monitor.

## 2013-10-20 NOTE — Telephone Encounter (Signed)
Will forward to KC as an FYI 

## 2013-10-20 NOTE — Care Management Note (Addendum)
    Page 1 of 1   10/24/2013     12:08:25 PM   CARE MANAGEMENT NOTE 10/24/2013  [name], [name]   Account Number:  [id number]  Date Initiated:  10/20/2013  Documentation initiated by:  Lanier Clam  Subjective/Objective Assessment:   72 Y/O F ADMITTED W/SOB.COPD.     Action/Plan:   FROM HOME.HAS HOME 02-[facility].HAS TRAVEL.   Anticipated DC Date:  10/24/2013   Anticipated DC Plan:  HOME W HOME HEALTH SERVICES      DC Planning Services  CM consult      Choice offered to / List presented to:  C-1 Patient        HH arranged  HH-1 RN  HH-2 PT      [facility] agency  [facility]   Status of service:  Completed, signed off Medicare Important Message given?   (If response is "NO", the following Medicare IM given date fields will be blank) Date Medicare IM given:   Date Additional Medicare IM given:    Discharge Disposition:  HOME W HOME HEALTH SERVICES  Per UR Regulation:  Reviewed for med. necessity/level of care/duration of stay  If discussed at Long Length of Stay Meetings, dates discussed:   10/23/2013    Comments:  10/23/13 Marsia Cino RN,BSN NCM [phone] [facility] CHOSEN FOR HH.TC K[facility] REP.  10/20/13 Ashly Goethe RN,BSN NCM [phone]

## 2013-10-20 NOTE — Progress Notes (Signed)
Patient ID: [name], female   DOB: 03-Mar-1941, 72 y.o.   MRN: [id number]  [facility] PROGRESS NOTE  [name] [id number]:[id number] DOB: 02/06/1941 DOA: 10/19/2013 PCP: Mickie Hillier, MD  Brief narrative: Pt is 72 yo female who presented to [facility] ED with main concern of 3 days duration of progressively worsening shortness of breath that initially started with exertion and now present at rest as well, associated with subjective fevers, chills, productive cough of yellow sputum, no specific aggravating or alleviating factors. Pt reports similar events in the past due to COPD flare ups. She denies any specific abdominal or urinary concerns, no headaches, no focal neurological symptoms.   In ED, pt hemodynamically stable with wheezing and mild hypoxia on RA, improved and well responding to oxygen and albuterol nebulizer. [facility] asked to admit for further evaluation and management of COPD exacerbation.   Assessment and Plan:  Principal Problem:  COPD (chronic obstructive pulmonary disease)  - pt clinically improving and maintaining oxygen saturation at target range  - continue oxygen, solumedrol, BD's scheduled and as needed  - continue Zithromax and Rocephin day #2 - sputum analysis pending  Active Problems:  Leukocytosis  - secondary to acute COPD/bronchitis  - will place on empiric ABX and will obtain sputum analysis  - CBC in AM  HTN (hypertension)  - stable on admission  - continue Benicar  HLD (hyperlipidemia)  - continue statin  Anemia of chronic disease  - Hg/Hct stable and at pt's baseline  - CBC in AM   Consultants:  None  Procedures/Studies: Dg Chest 2 View   10/19/2013   Mild to moderate changes of acute bronchitis superimposed upon COPD/emphysema. No localized airspace pneumonia.  Enlarged left main pulmonary artery. This is nonspecific but can be seen in patients with aortic stenosis.   Antibiotics:  Zithromax 12/14 -->  Rocephin 12/14 -->   Code  Status: Full Family Communication: Pt at bedside Disposition Plan: Home when medically stable  HPI/Subjective: No events overnight.   Objective: Filed Vitals:   10/19/13 2038 10/20/13 0333 10/20/13 0436 10/20/13 0754  BP: 115/62  135/61   Pulse: 92  80   Temp: 98.1 F (36.7 C)  97.8 F (36.6 C)   TempSrc: Oral  Oral   Resp: 18  18   Height:      Weight:      SpO2: 97% 96% 100% 92%    Intake/Output Summary (Last 24 hours) at 10/20/13 1022 Last data filed at 10/20/13 0753  Gross per 24 hour  Intake 193.33 ml  Output    925 ml  Net -731.67 ml    Exam:   General:  Pt is alert, follows commands appropriately, not in acute distress  Cardiovascular: Regular rate and rhythm, S1/S2, no murmurs, no rubs, no gallops  Respiratory: Clear to auscultation bilaterally, no wheezing, no crackles, no rhonchi  Abdomen: Soft, non tender, non distended, bowel sounds present, no guarding  Extremities: No edema, pulses DP and PT palpable bilaterally  Neuro: Grossly nonfocal  Data Reviewed: Basic Metabolic Panel:  Recent Labs Lab 10/19/13 1131 10/20/13 0515  NA 137 140  K 4.0 4.7  CL 94* 99  CO2 31 32  GLUCOSE 164* 162*  BUN 21 18  CREATININE 0.63 0.67  CALCIUM 10.5 9.8  CBC:  Recent Labs Lab 10/19/13 1131 10/20/13 0515  WBC 17.1* 10.8*  NEUTROABS 15.4*  --   HGB 11.7* 10.0*  HCT 35.2* 30.9*  MCV 97.8 98.7  PLT 220 196  Recent Results (from the past 240 hour(s))  CULTURE, BLOOD (ROUTINE X 2)     Status: None   Collection Time    10/19/13  3:26 PM      Result Value Range Status   Specimen Description BLOOD LEFT ARM   Final   Special Requests BOTTLES DRAWN AEROBIC AND ANAEROBIC 5CC   Final   Culture  Setup Time     Final   Value: 10/19/2013 21:29     Performed at [facility]   Culture     Final   Value:        BLOOD CULTURE RECEIVED NO GROWTH TO DATE CULTURE WILL BE HELD FOR 5 DAYS BEFORE ISSUING A FINAL NEGATIVE REPORT     Performed at [facility]   Report Status PENDING   Incomplete  CULTURE, BLOOD (ROUTINE X 2)     Status: None   Collection Time    10/19/13  3:37 PM      Result Value Range Status   Specimen Description BLOOD RIGHT ARM   Final   Special Requests BOTTLES DRAWN AEROBIC AND ANAEROBIC 5CC   Final   Culture  Setup Time     Final   Value: 10/19/2013 21:28     Performed at [facility]   Culture     Final   Value:        BLOOD CULTURE RECEIVED NO GROWTH TO DATE CULTURE WILL BE HELD FOR 5 DAYS BEFORE ISSUING A FINAL NEGATIVE REPORT     Performed at [facility]   Report Status PENDING   Incomplete  CULTURE, EXPECTORATED SPUTUM-ASSESSMENT     Status: None   Collection Time    10/19/13  5:11 PM      Result Value Range Status   Specimen Description SPUTUM   Final   Special Requests NONE   Final   Sputum evaluation     Final   Value: THIS SPECIMEN IS ACCEPTABLE. RESPIRATORY CULTURE REPORT TO FOLLOW.   Report Status 10/19/2013 FINAL   Final     Scheduled Meds: . albuterol  2.5 mg Nebulization QID  . aspirin  81 mg Oral Daily  . atorvastatin  80 mg Oral Daily  . azithromycin  500 mg Oral Q24H  . cefTRIAXone (ROCEPHIN)  IV  1 g Intravenous Q24H  . enoxaparin (LOVENOX) injection  40 mg Subcutaneous Q24H  . ipratropium  0.5 mg Nebulization QID  . irbesartan  300 mg Oral Daily  . methylPREDNISolone (SOLU-MEDROL) injection  60 mg Intravenous Q6H   Continuous Infusions:    Debbora Presto, MD  [facility] Pager [phone]  If 7PM-7AM, please contact night-coverage [email or web address] Password [email or web address] 10/20/2013, 10:22 AM   LOS: 1 day

## 2013-10-21 LAB — RESPIRATORY VIRUS PANEL
Adenovirus: NOT DETECTED
Metapneumovirus: NOT DETECTED
Parainfluenza 1: NOT DETECTED
Parainfluenza 2: NOT DETECTED
Parainfluenza 3: NOT DETECTED
Respiratory Syncytial Virus A: NOT DETECTED
Respiratory Syncytial Virus B: NOT DETECTED
Rhinovirus: NOT DETECTED

## 2013-10-21 LAB — BASIC METABOLIC PANEL
CO2: 31 mEq/L (ref 19–32)
Chloride: 99 mEq/L (ref 96–112)
GFR calc non Af Amer: 85 mL/min — ABNORMAL LOW (ref >90)
Glucose, Bld: 140 mg/dL — ABNORMAL HIGH (ref 70–99)
Potassium: 4.5 mEq/L (ref 3.5–5.1)
Sodium: 138 mEq/L (ref 135–145)

## 2013-10-21 LAB — CULTURE, RESPIRATORY

## 2013-10-21 LAB — CULTURE, RESPIRATORY W GRAM STAIN

## 2013-10-21 LAB — CBC
HCT: 32.4 % — ABNORMAL LOW (ref 36.0–46.0)
Hemoglobin: 10.8 g/dL — ABNORMAL LOW (ref 12.0–15.0)
RBC: 3.33 MIL/uL — ABNORMAL LOW (ref 3.87–5.11)

## 2013-10-21 MED ORDER — ALPRAZOLAM 0.25 MG PO TABS
0.2500 mg | ORAL_TABLET | Freq: Three times a day (TID) | ORAL | Status: DC | PRN
  Administered 2013-10-21 – 2013-10-24 (×7): 0.25 mg via ORAL
  Filled 2013-10-21 (×7): qty 1

## 2013-10-21 MED ORDER — HYDROCOD POLST-CHLORPHEN POLST 10-8 MG/5ML PO LQCR
5.0000 mL | Freq: Two times a day (BID) | ORAL | Status: DC | PRN
  Administered 2013-10-21 – 2013-10-23 (×6): 5 mL via ORAL
  Filled 2013-10-21 (×7): qty 5

## 2013-10-21 MED ORDER — IPRATROPIUM BROMIDE 0.02 % IN SOLN
0.5000 mg | Freq: Four times a day (QID) | RESPIRATORY_TRACT | Status: DC | PRN
  Administered 2013-10-23: 0.5 mg via RESPIRATORY_TRACT
  Filled 2013-10-21: qty 2.5

## 2013-10-21 MED ORDER — BENZONATATE 100 MG PO CAPS
200.0000 mg | ORAL_CAPSULE | Freq: Three times a day (TID) | ORAL | Status: DC | PRN
  Administered 2013-10-21 – 2013-10-24 (×8): 200 mg via ORAL
  Filled 2013-10-21 (×8): qty 2

## 2013-10-21 MED ORDER — ALBUTEROL SULFATE (5 MG/ML) 0.5% IN NEBU
2.5000 mg | INHALATION_SOLUTION | RESPIRATORY_TRACT | Status: DC
  Administered 2013-10-21 – 2013-10-24 (×13): 2.5 mg via RESPIRATORY_TRACT
  Filled 2013-10-21 (×17): qty 0.5

## 2013-10-21 MED ORDER — SENNA 8.6 MG PO TABS
1.0000 | ORAL_TABLET | Freq: Two times a day (BID) | ORAL | Status: DC
  Administered 2013-10-21 – 2013-10-23 (×3): 8.6 mg via ORAL
  Filled 2013-10-21 (×3): qty 1

## 2013-10-21 MED ORDER — ALBUTEROL SULFATE (5 MG/ML) 0.5% IN NEBU
2.5000 mg | INHALATION_SOLUTION | RESPIRATORY_TRACT | Status: DC | PRN
  Administered 2013-10-23 – 2013-10-24 (×2): 2.5 mg via RESPIRATORY_TRACT
  Filled 2013-10-21 (×2): qty 0.5

## 2013-10-21 MED ORDER — METHYLPREDNISOLONE SODIUM SUCC 40 MG IJ SOLR
40.0000 mg | Freq: Three times a day (TID) | INTRAMUSCULAR | Status: DC
  Administered 2013-10-21 – 2013-10-23 (×6): 40 mg via INTRAVENOUS
  Filled 2013-10-21 (×9): qty 1

## 2013-10-21 MED ORDER — ENOXAPARIN SODIUM 30 MG/0.3ML ~~LOC~~ SOLN
30.0000 mg | SUBCUTANEOUS | Status: DC
  Administered 2013-10-21 – 2013-10-23 (×3): 30 mg via SUBCUTANEOUS
  Filled 2013-10-21 (×4): qty 0.3

## 2013-10-21 MED ORDER — ALPRAZOLAM 0.5 MG PO TABS
0.5000 mg | ORAL_TABLET | Freq: Three times a day (TID) | ORAL | Status: DC | PRN
  Filled 2013-10-21: qty 1

## 2013-10-21 NOTE — Progress Notes (Signed)
Patient ID: [name], female   DOB: 1941-06-29, 72 y.o.   MRN: [id number] [facility] PROGRESS NOTE  [name] NWG:[id number] DOB: 1941-05-25 DOA: 10/19/2013 PCP: Mickie Hillier, MD  Brief narrative:  Pt is 72 yo female who presented to [facility] ED with main concern of 3 days duration of progressively worsening shortness of breath that initially started with exertion and now present at rest as well, associated with subjective fevers, chills, productive cough of yellow sputum, no specific aggravating or alleviating factors. Pt reports similar events in the past due to COPD flare ups. She denies any specific abdominal or urinary concerns, no headaches, no focal neurological symptoms.   In ED, pt hemodynamically stable with wheezing and mild hypoxia on RA, improved and well responding to oxygen and albuterol nebulizer. [facility] asked to admit for further evaluation and management of COPD exacerbation.   Assessment and Plan:  Principal Problem:  COPD (chronic obstructive pulmonary disease)  - pt clinically improving and maintaining oxygen saturation at target range but did drop oxygen saturation down to 80's with ambulation  - continue oxygen, BD's scheduled and as needed  - will taper down solumedrol  - continue Zithromax and Rocephin day #3 - preliminary report on sputum culture with Moraxella, current ABX should cover  Active Problems:  Leukocytosis  - secondary to acute COPD/bronchitis  - continue to taper down solumedrol  - CBC in AM  HTN (hypertension)  - reasonable inpatient control  - continue Benicar  HLD (hyperlipidemia)  - continue statin  Anemia of chronic disease  - Hg/Hct stable and at pt's baseline but slightly down over 24 hours  - CBC in AM   Consultants:  None Procedures/Studies:  Dg Chest 2 View 10/19/2013 Mild to moderate changes of acute bronchitis superimposed upon COPD/emphysema. No localized airspace pneumonia. Enlarged left main pulmonary artery.  This is nonspecific but can be seen in patients with aortic stenosis.  Antibiotics:  Zithromax 12/14 -->  Rocephin 12/14 -->   Code Status: Full  Family Communication: Pt at bedside  Disposition Plan: Home when medically stable  HPI/Subjective: No events overnight.   Objective: Filed Vitals:   10/21/13 0049 10/21/13 0455 10/21/13 0755 10/21/13 1413  BP:  138/72  139/73  Pulse:  88  87  Temp:  97.5 F (36.4 C)  98.1 F (36.7 C)  TempSrc:  Oral  Oral  Resp:  18  18  Height:      Weight:      SpO2: 98% 100% 97% 97%    Intake/Output Summary (Last 24 hours) at 10/21/13 1850 Last data filed at 10/21/13 1800  Gross per 24 hour  Intake   1140 ml  Output   1000 ml  Net    140 ml    Exam:   General:  Pt is alert, follows commands appropriately, not in acute distress  Cardiovascular: Regular rate and rhythm, S1/S2, no murmurs, no rubs, no gallops  Respiratory: Clear to auscultation bilaterally, no wheezing, no crackles, no rhonchi  Abdomen: Soft, non tender, non distended, bowel sounds present, no guarding  Extremities: No edema, pulses DP and PT palpable bilaterally  Neuro: Grossly nonfocal  Data Reviewed: Basic Metabolic Panel:  Recent Labs Lab 10/19/13 1131 10/20/13 0515 10/21/13 0519  NA 137 140 138  K 4.0 4.7 4.5  CL 94* 99 99  CO2 31 32 31  GLUCOSE 164* 162* 140*  BUN 21 18 20   CREATININE 0.63 0.67 0.68  CALCIUM 10.5 9.8 9.4  CBC:  Recent Labs Lab 10/19/13 1131 10/20/13 0515 10/21/13 0519  WBC 17.1* 10.8* 11.8*  NEUTROABS 15.4*  --   --   HGB 11.7* 10.0* 10.8*  HCT 35.2* 30.9* 32.4*  MCV 97.8 98.7 97.3  PLT 220 196 218   Recent Results (from the past 240 hour(s))  CULTURE, BLOOD (ROUTINE X 2)     Status: None   Collection Time    10/19/13  3:26 PM      Result Value Range Status   Specimen Description BLOOD LEFT ARM   Final   Special Requests BOTTLES DRAWN AEROBIC AND ANAEROBIC 5CC   Final   Culture  Setup Time     Final   Value:  10/19/2013 21:29     Performed at [facility]   Culture     Final   Value:        BLOOD CULTURE RECEIVED NO GROWTH TO DATE CULTURE WILL BE HELD FOR 5 DAYS BEFORE ISSUING A FINAL NEGATIVE REPORT     Performed at [facility]   Report Status PENDING   Incomplete  CULTURE, BLOOD (ROUTINE X 2)     Status: None   Collection Time    10/19/13  3:37 PM      Result Value Range Status   Specimen Description BLOOD RIGHT ARM   Final   Special Requests BOTTLES DRAWN AEROBIC AND ANAEROBIC 5CC   Final   Culture  Setup Time     Final   Value: 10/19/2013 21:28     Performed at [facility]   Culture     Final   Value:        BLOOD CULTURE RECEIVED NO GROWTH TO DATE CULTURE WILL BE HELD FOR 5 DAYS BEFORE ISSUING A FINAL NEGATIVE REPORT     Performed at [facility]   Report Status PENDING   Incomplete  CULTURE, EXPECTORATED SPUTUM-ASSESSMENT     Status: None   Collection Time    10/19/13  5:11 PM      Result Value Range Status   Specimen Description SPUTUM   Final   Special Requests NONE   Final   Sputum evaluation     Final   Value: THIS SPECIMEN IS ACCEPTABLE. RESPIRATORY CULTURE REPORT TO FOLLOW.   Report Status 10/19/2013 FINAL   Final  CULTURE, RESPIRATORY (NON-EXPECTORATED)     Status: None   Collection Time    10/19/13  5:11 PM      Result Value Range Status   Specimen Description SPUTUM   Final   Special Requests NONE   Final   Gram Stain     Final   Value: ABUNDANT WBC PRESENT, PREDOMINANTLY PMN     NO SQUAMOUS EPITHELIAL CELLS SEEN     MODERATE GRAM NEGATIVE COCCOBACILLI     RARE GRAM POSITIVE COCCI     IN PAIRS   Culture     Final   Value: MODERATE MORAXELLA CATARRHALIS(BRANHAMELLA)     Note: BETA LACTAMASE POSITIVE     Performed at [facility]   Report Status 10/21/2013 FINAL   Final  RESPIRATORY VIRUS PANEL     Status: None   Collection Time    10/19/13  5:12 PM      Result Value Range Status   Source - RVPAN NASAL SWAB   Corrected    Comment: CORRECTED ON 12/16 AT 1833: PREVIOUSLY REPORTED AS NASAL SWAB   Respiratory Syncytial Virus A NOT DETECTED   Final  Respiratory Syncytial Virus B NOT DETECTED   Final   Influenza A NOT DETECTED   Final   Influenza B NOT DETECTED   Final   Parainfluenza 1 NOT DETECTED   Final   Parainfluenza 2 NOT DETECTED   Final   Parainfluenza 3 NOT DETECTED   Final   Metapneumovirus NOT DETECTED   Final   Rhinovirus NOT DETECTED   Final   Adenovirus NOT DETECTED   Final   Influenza A H1 NOT DETECTED   Final   Influenza A H3 NOT DETECTED   Final   Comment: (NOTE)           Normal Reference Range for each Analyte: NOT DETECTED     Testing performed using the [employer] xTAG Respiratory Viral Panel test     kit.     This test was developed and its performance characteristics determined     by [facility]. It has not been cleared or approved by the [place]     Food and Drug Administration. This test is used for clinical purposes.     It should not be regarded as investigational or for research. This     laboratory is certified under the Clinical Laboratory Improvement     Amendments of 1988 (CLIA) as qualified to perform high complexity     clinical laboratory testing.     Performed at [facility]     Scheduled Meds: . albuterol  2.5 mg Nebulization Q4H WA  . aspirin  81 mg Oral Daily  . atorvastatin  80 mg Oral Daily  . azithromycin  500 mg Oral Q24H  . cefTRIAXone (ROCEPHIN)  IV  1 g Intravenous Q24H  . enoxaparin (LOVENOX) injection  30 mg Subcutaneous Q24H  . guaiFENesin  600 mg Oral BID  . ipratropium  0.5 mg Nebulization QID  . irbesartan  300 mg Oral Daily  . methylPREDNISolone (SOLU-MEDROL) injection  40 mg Intravenous Q8H   Continuous Infusions:   Debbora Presto, MD  [facility] Pager [phone]  If 7PM-7AM, please contact night-coverage [email or web address] Password [email or web address] 10/21/2013, 6:50 PM   LOS: 2 days

## 2013-10-22 DIAGNOSIS — I10 Essential (primary) hypertension: Secondary | ICD-10-CM

## 2013-10-22 DIAGNOSIS — J449 Chronic obstructive pulmonary disease, unspecified: Secondary | ICD-10-CM

## 2013-10-22 DIAGNOSIS — J4489 Other specified chronic obstructive pulmonary disease: Secondary | ICD-10-CM

## 2013-10-22 DIAGNOSIS — J189 Pneumonia, unspecified organism: Secondary | ICD-10-CM

## 2013-10-22 LAB — CBC
HCT: 31.6 % — ABNORMAL LOW (ref 36.0–46.0)
Hemoglobin: 10.2 g/dL — ABNORMAL LOW (ref 12.0–15.0)
MCH: 32 pg (ref 26.0–34.0)
MCV: 99.1 fL (ref 78.0–100.0)
RBC: 3.19 MIL/uL — ABNORMAL LOW (ref 3.87–5.11)
WBC: 8.5 10*3/uL (ref 4.0–10.5)

## 2013-10-22 LAB — BASIC METABOLIC PANEL
BUN: 24 mg/dL — ABNORMAL HIGH (ref 6–23)
CO2: 31 mEq/L (ref 19–32)
Calcium: 9.2 mg/dL (ref 8.4–10.5)
Glucose, Bld: 131 mg/dL — ABNORMAL HIGH (ref 70–99)
Potassium: 4.2 mEq/L (ref 3.5–5.1)
Sodium: 140 mEq/L (ref 135–145)

## 2013-10-22 MED ORDER — AMLODIPINE BESYLATE 2.5 MG PO TABS
2.5000 mg | ORAL_TABLET | Freq: Every day | ORAL | Status: DC
  Administered 2013-10-22 – 2013-10-24 (×3): 2.5 mg via ORAL
  Filled 2013-10-22 (×3): qty 1

## 2013-10-22 NOTE — Progress Notes (Signed)
Patient ID: [name], female   DOB: [birth year], 72 y.o.   MRN: [id number] [facility] PROGRESS NOTE  [name] WUJ:[id number] DOB: 27-Oct-1941 DOA: 10/19/2013 PCP: Mickie Hillier, MD  Brief narrative:  Pt is 72 yo female who presented to [facility] ED with main concern of 3 days duration of progressively worsening shortness of breath that initially started with exertion and now present at rest as well, associated with subjective fevers, chills, productive cough of yellow sputum, no specific aggravating or alleviating factors. Pt reports similar events in the past due to COPD flare ups. She denies any specific abdominal or urinary concerns, no headaches, no focal neurological symptoms.   In ED, pt hemodynamically stable with wheezing and mild hypoxia on RA, improved and well responding to oxygen and albuterol nebulizer. [facility] asked to admit for further evaluation and management of COPD exacerbation.   Assessment and Plan:  Principal Problem:  COPD (chronic obstructive pulmonary disease)  - Improved, however not quite at her baseline.  - Will continue 1 more day of Solumedrol 40mg  IV q 8 hours, with plans to transition to PO steroids tomorrow  if she continues to improve. Continue Nebs, emperic AB therapy and inhaled steriods.  Active Problems:  Leukocytosis  - secondary to acute COPD/bronchitis  - continue to taper down solumedrol  - CB HTN (hypertension)  - SBP's in the 150's today. She is on Avapro 300mg  PO q daily. Will add Novasc 2.5 mg Po q daily - continue Benicar  HLD (hyperlipidemia)  - continue statin  Anemia of chronic disease  - Hg/Hct stable and at pt's baseline but slightly down over 24 hours    Consultants:  None Procedures/Studies:  Dg Chest 2 View 10/19/2013 Mild to moderate changes of acute bronchitis superimposed upon COPD/emphysema. No localized airspace pneumonia. Enlarged left main pulmonary artery. This is nonspecific but can be seen in patients with  aortic stenosis.  Antibiotics:  Zithromax 12/14 -->  Rocephin 12/14 -->   Code Status: Full  Family Communication: Pt at bedside  Disposition Plan: Home when medically stable  HPI/Subjective: No events overnight.   Objective: Filed Vitals:   10/22/13 0458 10/22/13 0737 10/22/13 1138 10/22/13 1300  BP: 147/75   157/80  Pulse: 60   109  Temp: 97.2 F (36.2 C)   98 F (36.7 C)  TempSrc: Oral   Oral  Resp: 16   18  Height:      Weight:      SpO2: 99% 99% 99% 96%    Intake/Output Summary (Last 24 hours) at 10/22/13 1310 Last data filed at 10/22/13 0700  Gross per 24 hour  Intake   1000 ml  Output   1400 ml  Net   -400 ml    Exam:   General:  Pt is alert, follows commands appropriately, not in acute distress  Cardiovascular: Regular rate and rhythm, S1/S2, no murmurs, no rubs, no gallops  Respiratory: Diminished breath sounds B/L  Abdomen: Soft, non tender, non distended, bowel sounds present, no guarding  Extremities: No edema, pulses DP and PT palpable bilaterally  Neuro: Grossly nonfocal  Data Reviewed: Basic Metabolic Panel:  Recent Labs Lab 10/19/13 1131 10/20/13 0515 10/21/13 0519 10/22/13 0535  NA 137 140 138 140  K 4.0 4.7 4.5 4.2  CL 94* 99 99 102  CO2 31 32 31 31  GLUCOSE 164* 162* 140* 131*  BUN 21 18 20  24*  CREATININE 0.63 0.67 0.68 0.66  CALCIUM 10.5 9.8 9.4 9.2  CBC:  Recent Labs Lab 10/19/13 1131 10/20/13 0515 10/21/13 0519 10/22/13 0535  WBC 17.1* 10.8* 11.8* 8.5  NEUTROABS 15.4*  --   --   --   HGB 11.7* 10.0* 10.8* 10.2*  HCT 35.2* 30.9* 32.4* 31.6*  MCV 97.8 98.7 97.3 99.1  PLT 220 196 218 216   Recent Results (from the past 240 hour(s))  CULTURE, BLOOD (ROUTINE X 2)     Status: None   Collection Time    10/19/13  3:26 PM      Result Value Range Status   Specimen Description BLOOD LEFT ARM   Final   Special Requests BOTTLES DRAWN AEROBIC AND ANAEROBIC 5CC   Final   Culture  Setup Time     Final   Value:  10/19/2013 21:29     Performed at [facility]   Culture     Final   Value:        BLOOD CULTURE RECEIVED NO GROWTH TO DATE CULTURE WILL BE HELD FOR 5 DAYS BEFORE ISSUING A FINAL NEGATIVE REPORT     Performed at [facility]   Report Status PENDING   Incomplete  CULTURE, BLOOD (ROUTINE X 2)     Status: None   Collection Time    10/19/13  3:37 PM      Result Value Range Status   Specimen Description BLOOD RIGHT ARM   Final   Special Requests BOTTLES DRAWN AEROBIC AND ANAEROBIC 5CC   Final   Culture  Setup Time     Final   Value: 10/19/2013 21:28     Performed at [facility]   Culture     Final   Value:        BLOOD CULTURE RECEIVED NO GROWTH TO DATE CULTURE WILL BE HELD FOR 5 DAYS BEFORE ISSUING A FINAL NEGATIVE REPORT     Performed at [facility]   Report Status PENDING   Incomplete  CULTURE, EXPECTORATED SPUTUM-ASSESSMENT     Status: None   Collection Time    10/19/13  5:11 PM      Result Value Range Status   Specimen Description SPUTUM   Final   Special Requests NONE   Final   Sputum evaluation     Final   Value: THIS SPECIMEN IS ACCEPTABLE. RESPIRATORY CULTURE REPORT TO FOLLOW.   Report Status 10/19/2013 FINAL   Final  CULTURE, RESPIRATORY (NON-EXPECTORATED)     Status: None   Collection Time    10/19/13  5:11 PM      Result Value Range Status   Specimen Description SPUTUM   Final   Special Requests NONE   Final   Gram Stain     Final   Value: ABUNDANT WBC PRESENT, PREDOMINANTLY PMN     NO SQUAMOUS EPITHELIAL CELLS SEEN     MODERATE GRAM NEGATIVE COCCOBACILLI     RARE GRAM POSITIVE COCCI     IN PAIRS   Culture     Final   Value: MODERATE MORAXELLA CATARRHALIS(BRANHAMELLA)     Note: BETA LACTAMASE POSITIVE     Performed at [facility]   Report Status 10/21/2013 FINAL   Final  RESPIRATORY VIRUS PANEL     Status: None   Collection Time    10/19/13  5:12 PM      Result Value Range Status   Source - RVPAN NASAL SWAB   Corrected    Comment: CORRECTED ON 12/16 AT 1833: PREVIOUSLY REPORTED AS NASAL SWAB  Respiratory Syncytial Virus A NOT DETECTED   Final   Respiratory Syncytial Virus B NOT DETECTED   Final   Influenza A NOT DETECTED   Final   Influenza B NOT DETECTED   Final   Parainfluenza 1 NOT DETECTED   Final   Parainfluenza 2 NOT DETECTED   Final   Parainfluenza 3 NOT DETECTED   Final   Metapneumovirus NOT DETECTED   Final   Rhinovirus NOT DETECTED   Final   Adenovirus NOT DETECTED   Final   Influenza A H1 NOT DETECTED   Final   Influenza A H3 NOT DETECTED   Final   Comment: (NOTE)           Normal Reference Range for each Analyte: NOT DETECTED     Testing performed using the [employer] xTAG Respiratory Viral Panel test     kit.     This test was developed and its performance characteristics determined     by [facility]. It has not been cleared or approved by the [place]     Food and Drug Administration. This test is used for clinical purposes.     It should not be regarded as investigational or for research. This     laboratory is certified under the Clinical Laboratory Improvement     Amendments of 1988 (CLIA) as qualified to perform high complexity     clinical laboratory testing.     Performed at [facility]     Scheduled Meds: . albuterol  2.5 mg Nebulization Q4H WA  . aspirin  81 mg Oral Daily  . atorvastatin  80 mg Oral Daily  . azithromycin  500 mg Oral Q24H  . cefTRIAXone (ROCEPHIN)  IV  1 g Intravenous Q24H  . enoxaparin (LOVENOX) injection  30 mg Subcutaneous Q24H  . guaiFENesin  600 mg Oral BID  . ipratropium  0.5 mg Nebulization QID  . irbesartan  300 mg Oral Daily  . methylPREDNISolone (SOLU-MEDROL) injection  40 mg Intravenous Q8H  . senna  1 tablet Oral BID   Continuous Infusions:   Jeralyn Bennett, MD  [facility] Pager [phone]  If 7PM-7AM, please contact night-coverage [email or web address] Password [email or web address] 10/22/2013, 1:10 PM   LOS: 3 days

## 2013-10-23 DIAGNOSIS — R0609 Other forms of dyspnea: Secondary | ICD-10-CM

## 2013-10-23 DIAGNOSIS — D72829 Elevated white blood cell count, unspecified: Secondary | ICD-10-CM

## 2013-10-23 DIAGNOSIS — K5900 Constipation, unspecified: Secondary | ICD-10-CM

## 2013-10-23 MED ORDER — LEVOFLOXACIN 750 MG PO TABS
750.0000 mg | ORAL_TABLET | Freq: Every day | ORAL | Status: DC
  Administered 2013-10-24: 10:00:00 750 mg via ORAL
  Filled 2013-10-23: qty 1

## 2013-10-23 MED ORDER — PREDNISONE 50 MG PO TABS
60.0000 mg | ORAL_TABLET | Freq: Every day | ORAL | Status: DC
  Administered 2013-10-24: 60 mg via ORAL
  Filled 2013-10-23 (×2): qty 1

## 2013-10-23 NOTE — Progress Notes (Signed)
[facility] PROGRESS NOTE  VELVA MOLINARI Miranda:[id number] DOB: 1940-12-25 DOA: 10/19/2013 PCP: Mickie Hillier, MD  Assessment/Plan: 1. COPD exacerbation. Patient reports feeling better today, improved from yesterday. Will discontinue IV Solu-Medrol, plan to transition her to oral prednisone 60 mg by mouth daily. Will also discontinue IV antibiotics and transition her to oral Levaquin 750 mg by mouth daily. Continue supportive care, anticipate discharge her tomorrow morning if she remains stable/improved.  2. Hypertension. Patient systolic blood pressures in the 140s, improved from yesterday after adding Norvasc 2.5 mg daily. Continue her irbasartan by mouth daily and Norvasc 2.5 mg daily. 3. Dyslipidemia. Continue Lipitor 80 mg by mouth daily. 4. Constipation. Patient reports having several bowel movements yesterday after receiving a stool softener.  Code Status: Full code Disposition Plan: Plan to discharge patient in the morning if she continues to show improvement.  Antibiotics:  Levaquin 750 mg by mouth daily (started on 10/23/2013)  Rocephin (discontinued on 10/23/2013)  Azithromycin (discontinued on 10/23/2013)  HPI/Subjective: Patient reports feeling little better today, she remains on supplemental oxygen which is her baseline. Tolerating by mouth intake, remains afebrile.  Objective: Filed Vitals:   10/23/13 1403  BP: 147/73  Pulse: 83  Temp: 98.6 F (37 C)  Resp: 18    Intake/Output Summary (Last 24 hours) at 10/23/13 1620 Last data filed at 10/23/13 1300  Gross per 24 hour  Intake    720 ml  Output    605 ml  Net    115 ml   Filed Weights   10/19/13 1120  Weight: 43.999 kg (97 lb)    Exam:   General:  Patient is in no acute distress awake alert oriented  Cardiovascular: Regular rate rhythm normal S1-S2  Respiratory: Diminished breath sounds bilaterally, a few scattered tori wheezes  Abdomen: Abdomen is soft nontender nondistended positive  bowel  Musculoskeletal: Present range of motion of all extremities, no extremity edema noted  Data Reviewed: Basic Metabolic Panel:  Recent Labs Lab 10/19/13 1131 10/20/13 0515 10/21/13 0519 10/22/13 0535  NA 137 140 138 140  K 4.0 4.7 4.5 4.2  CL 94* 99 99 102  CO2 31 32 31 31  GLUCOSE 164* 162* 140* 131*  BUN 21 18 20  24*  CREATININE 0.63 0.67 0.68 0.66  CALCIUM 10.5 9.8 9.4 9.2   Liver Function Tests: No results found for this basename: AST, ALT, ALKPHOS, BILITOT, PROT, ALBUMIN,  in the last 168 hours No results found for this basename: LIPASE, AMYLASE,  in the last 168 hours No results found for this basename: AMMONIA,  in the last 168 hours CBC:  Recent Labs Lab 10/19/13 1131 10/20/13 0515 10/21/13 0519 10/22/13 0535  WBC 17.1* 10.8* 11.8* 8.5  NEUTROABS 15.4*  --   --   --   HGB 11.7* 10.0* 10.8* 10.2*  HCT 35.2* 30.9* 32.4* 31.6*  MCV 97.8 98.7 97.3 99.1  PLT 220 196 218 216   Cardiac Enzymes: No results found for this basename: CKTOTAL, CKMB, CKMBINDEX, TROPONINI,  in the last 168 hours BNP (last 3 results)  Recent Labs  10/29/12 0935  PROBNP 352.9*   CBG: No results found for this basename: GLUCAP,  in the last 168 hours  Recent Results (from the past 240 hour(s))  CULTURE, BLOOD (ROUTINE X 2)     Status: None   Collection Time    10/19/13  3:26 PM      Result Value Range Status   Specimen Description BLOOD LEFT ARM   Final  Special Requests BOTTLES DRAWN AEROBIC AND ANAEROBIC 5CC   Final   Culture  Setup Time     Final   Value: 10/19/2013 21:29     Performed at [facility]   Culture     Final   Value:        BLOOD CULTURE RECEIVED NO GROWTH TO DATE CULTURE WILL BE HELD FOR 5 DAYS BEFORE ISSUING A FINAL NEGATIVE REPORT     Performed at [facility]   Report Status PENDING   Incomplete  CULTURE, BLOOD (ROUTINE X 2)     Status: None   Collection Time    10/19/13  3:37 PM      Result Value Range Status   Specimen  Description BLOOD RIGHT ARM   Final   Special Requests BOTTLES DRAWN AEROBIC AND ANAEROBIC 5CC   Final   Culture  Setup Time     Final   Value: 10/19/2013 21:28     Performed at [facility]   Culture     Final   Value:        BLOOD CULTURE RECEIVED NO GROWTH TO DATE CULTURE WILL BE HELD FOR 5 DAYS BEFORE ISSUING A FINAL NEGATIVE REPORT     Performed at [facility]   Report Status PENDING   Incomplete  CULTURE, EXPECTORATED SPUTUM-ASSESSMENT     Status: None   Collection Time    10/19/13  5:11 PM      Result Value Range Status   Specimen Description SPUTUM   Final   Special Requests NONE   Final   Sputum evaluation     Final   Value: THIS SPECIMEN IS ACCEPTABLE. RESPIRATORY CULTURE REPORT TO FOLLOW.   Report Status 10/19/2013 FINAL   Final  CULTURE, RESPIRATORY (NON-EXPECTORATED)     Status: None   Collection Time    10/19/13  5:11 PM      Result Value Range Status   Specimen Description SPUTUM   Final   Special Requests NONE   Final   Gram Stain     Final   Value: ABUNDANT WBC PRESENT, PREDOMINANTLY PMN     NO SQUAMOUS EPITHELIAL CELLS SEEN     MODERATE GRAM NEGATIVE COCCOBACILLI     RARE GRAM POSITIVE COCCI     IN PAIRS   Culture     Final   Value: MODERATE MORAXELLA CATARRHALIS(BRANHAMELLA)     Note: BETA LACTAMASE POSITIVE     Performed at [facility]   Report Status 10/21/2013 FINAL   Final  RESPIRATORY VIRUS PANEL     Status: None   Collection Time    10/19/13  5:12 PM      Result Value Range Status   Source - RVPAN NASAL SWAB   Corrected   Comment: CORRECTED ON 12/16 AT 1833: PREVIOUSLY REPORTED AS NASAL SWAB   Respiratory Syncytial Virus A NOT DETECTED   Final   Respiratory Syncytial Virus B NOT DETECTED   Final   Influenza A NOT DETECTED   Final   Influenza B NOT DETECTED   Final   Parainfluenza 1 NOT DETECTED   Final   Parainfluenza 2 NOT DETECTED   Final   Parainfluenza 3 NOT DETECTED   Final   Metapneumovirus NOT DETECTED   Final    Rhinovirus NOT DETECTED   Final   Adenovirus NOT DETECTED   Final   Influenza A H1 NOT DETECTED   Final   Influenza A H3 NOT DETECTED  Final   Comment: (NOTE)           Normal Reference Range for each Analyte: NOT DETECTED     Testing performed using the Luminex xTAG Respiratory Viral Panel test     kit.     This test was developed and its performance characteristics determined     by [facility]. It has not been cleared or approved by the [place]     Food and Drug Administration. This test is used for clinical purposes.     It should not be regarded as investigational or for research. This     laboratory is certified under the Clinical Laboratory Improvement     Amendments of 1988 (CLIA) as qualified to perform high complexity     clinical laboratory testing.     Performed at [facility]     Studies: No results found.  Scheduled Meds: . albuterol  2.5 mg Nebulization Q4H WA  . amLODipine  2.5 mg Oral Daily  . aspirin  81 mg Oral Daily  . atorvastatin  80 mg Oral Daily  . enoxaparin (LOVENOX) injection  30 mg Subcutaneous Q24H  . guaiFENesin  600 mg Oral BID  . ipratropium  0.5 mg Nebulization QID  . irbesartan  300 mg Oral Daily  . [START ON 10/24/2013] levofloxacin  750 mg Oral Daily  . [START ON 10/24/2013] predniSONE  60 mg Oral Q breakfast  . senna  1 tablet Oral BID   Continuous Infusions:   Principal Problem:   COPD (chronic obstructive pulmonary disease) Active Problems:   HTN (hypertension)   HLD (hyperlipidemia)   Leukocytosis   Anemia of chronic disease    Time spent: 35 minutes    [name]  [facility] Pager [phone]. If 7PM-7AM, please contact night-coverage at [email or web address], password [facility] 10/23/2013, 4:20 PM  LOS: 4 days

## 2013-10-24 MED ORDER — POLYETHYLENE GLYCOL 3350 17 G PO PACK: 17.0000 g | PACK | Freq: Every day | ORAL | Status: DC | PRN

## 2013-10-24 MED ORDER — HYDROCOD POLST-CHLORPHEN POLST 10-8 MG/5ML PO LQCR: 5.0000 mL | Freq: Two times a day (BID) | ORAL | Status: DC | PRN

## 2013-10-24 MED ORDER — GUAIFENESIN ER 600 MG PO TB12: 600.0000 mg | ORAL_TABLET | Freq: Two times a day (BID) | ORAL | Status: DC

## 2013-10-24 MED ORDER — IPRATROPIUM BROMIDE 0.02 % IN SOLN: 0.5000 mg | Freq: Four times a day (QID) | RESPIRATORY_TRACT | Status: DC

## 2013-10-24 MED ORDER — PREDNISONE (PAK) 10 MG PO TABS: ORAL_TABLET | ORAL | Status: DC

## 2013-10-24 MED ORDER — BENZONATATE 100 MG PO CAPS: 100.0000 mg | ORAL_CAPSULE | Freq: Three times a day (TID) | ORAL | Status: DC | PRN

## 2013-10-24 NOTE — Discharge Summary (Signed)
Physician Discharge Summary  [name] ZOX:[id number] DOB: August 24, 1941 DOA: 10/19/2013  PCP: Mickie Hillier, MD  Admit date: 10/19/2013 Discharge date: 10/24/2013  Time spent: 35 minutes  Recommendations for Outpatient Follow-up:  1. Follow up on blood pressures as they had been elevated during this hospitalization.   Discharge Diagnoses:  Principal Problem:   COPD (chronic obstructive pulmonary disease) Active Problems:   HTN (hypertension)   HLD (hyperlipidemia)   Leukocytosis   Anemia of chronic disease   Discharge Condition: Stable/improved  Diet recommendation: Heart healthy  Filed Weights   10/19/13 1120  Weight: 43.999 kg (97 lb)    History of present illness:  Pt is 72 yo female who presented to [facility] ED with main concern of 3 days duration of progressively worsening shortness of breath that initially started with exertion and now present at rest as well, associated with subjective fevers, chills, productive cough of yellow sputum, no specific aggravating or alleviating factors. Pt reports similar events in the past due to COPD flare ups. She denies any specific abdominal or urinary concerns, no headaches, no focal neurological symptoms.  In ED, pt hemodynamically stable with wheezing and mild hypoxia on RA, improved and well responding to oxygen and albuterol nebulizer. [facility] asked to admit for further evaluation and management of COPD exacerbation.  Hospital Course:  Patient is a pleasant 72 year old female with a past medical history of chronic hypoxemic respiratory failure, on 2 L supplemental oxygen at baseline, chronic obstructive pulmonary disease, who was admitted to medicine service on 10/19/2013 presented with increasing shortness of breath and wheezing. Symptoms attributed to COPD exacerbation. She was started on IV steroids, DuoNeb's, empiric IV antibiotic therapy. Initial chest x-ray showed mild to moderate changes of acute bronchitis superimposed upon  COPD/emphysema. Blood cultures drawn on 10/19/2013 remained sterile. Sputum cultures were positive for Moraxella catarrhalis. She received a total of 6 days of empiric antibiotic therapy. Her white count came down from 17,100 on admission to 8500 x 12 17 2014. By 10/24/2013 she reported feeling much better and felt ready to be discharged home. Prior to discharge social work was consulted to set patient up with home health services, including home nursing for medication management regarding COPD. Given his clinical stability, she was discharged on 10/24/2013.   Consultations: Social work  Discharge Exam: Filed Vitals:   10/24/13 0540  BP: 156/79  Pulse: 91  Temp: 97.9 F (36.6 C)  Resp: 18    General: No acute distress she is awake alert and oriented, states feeling much better Cardiovascular: Regular rate rhythm normal S1-S2 Respiratory: Severely diminished breath sounds bilaterally, unchanged, I do not auscultate wheezing rhonchi or rales Abdomen: Soft nontender nondistended  Discharge Instructions  Discharge Orders   Future Appointments Provider Department Dept Phone   01/12/2014 2:00 PM Barbaraann Share, MD [facility] [phone]   Future Orders Complete By Expires   Call MD for:  difficulty breathing, headache or visual disturbances  As directed    Call MD for:  persistant dizziness or light-headedness  As directed    Call MD for:  persistant nausea and vomiting  As directed    Call MD for:  severe uncontrolled pain  As directed    Call MD for:  temperature >100.4  As directed    Diet - low sodium heart healthy  As directed    Increase activity slowly  As directed        Medication List         albuterol (  2.5 MG/3ML) 0.083% nebulizer solution  Commonly known as:  PROVENTIL  Take 3 mLs (2.5 mg total) by nebulization 4 (four) times daily. Dx: 492.8     albuterol 108 (90 BASE) MCG/ACT inhaler  Commonly known as:  PROAIR HFA  Inhale 2 puffs into the lungs every  6 (six) hours as needed.     ALPRAZolam 0.25 MG tablet  Commonly known as:  XANAX  Take 1 tablet (0.25 mg total) by mouth at bedtime as needed.     ANTIOXIDANT PO  Take 1 tablet by mouth daily.     aspirin 81 MG tablet  Take 81 mg by mouth daily.     atorvastatin 80 MG tablet  Commonly known as:  LIPITOR  Take 80 mg by mouth daily.     BENICAR 40 MG tablet  Generic drug:  olmesartan  Take 1 tablet by mouth daily.     benzonatate 100 MG capsule  Commonly known as:  TESSALON  Take 1 capsule (100 mg total) by mouth 3 (three) times daily as needed for cough.     budesonide-formoterol 160-4.5 MCG/ACT inhaler  Commonly known as:  SYMBICORT  Inhale 2 puffs into the lungs 2 (two) times daily.     CALTRATE 600 1500 MG Tabs  Generic drug:  Calcium Carbonate  Take 1 tablet by mouth 2 (two) times daily.     chlorpheniramine-HYDROcodone 10-8 MG/5ML Lqcr  Commonly known as:  TUSSIONEX  Take 5 mLs by mouth every 12 (twelve) hours as needed for cough.     Fish Oil 1000 MG Caps  Take 1 capsule by mouth 2 (two) times daily.     guaiFENesin 600 MG 12 hr tablet  Commonly known as:  MUCINEX  Take 1 tablet (600 mg total) by mouth 2 (two) times daily.     ipratropium 0.02 % nebulizer solution  Commonly known as:  ATROVENT  Take 2.5 mLs (0.5 mg total) by nebulization 4 (four) times daily.     Lutein 20 MG Caps  Take 1 capsule by mouth daily.     polyethylene glycol packet  Commonly known as:  MIRALAX / GLYCOLAX  Take 17 g by mouth daily as needed for mild constipation.     predniSONE 10 MG tablet  Commonly known as:  STERAPRED UNI-PAK  Take 6 tabs PO q daily for 2 days, then 4 tabs PO q daily for 3 days, then 3 tabs PO q daily for 3 days, then 2 tabs PO q daily for 3 days then 1 tab PO q daily for 3 days then stop     risedronate 150 MG tablet  Commonly known as:  ACTONEL  Take 150 mg by mouth every 30 (thirty) days. with water on empty stomach, nothing by mouth or lie down for  next 30 minutes.     sodium chloride 5 % ophthalmic solution  Commonly known as:  MURO 128  Place 1 drop into the left eye as needed. Dry eyes     SYSTANE 0.4-0.3 % Soln  Generic drug:  Polyethyl Glycol-Propyl Glycol  Apply 1 drop to eye daily as needed. dryness     tiotropium 18 MCG inhalation capsule  Commonly known as:  SPIRIVA  Place 1 capsule (18 mcg total) into inhaler and inhale daily.       Allergies  Allergen Reactions  . Oxycodone-Acetaminophen     REACTION: headaches,nausea       Follow-up Information   Follow up with Mickie Hillier, MD In  1 week.   Specialty:  Family Medicine   Contact information:   [address] [place] [address] [phone]        The results of significant diagnostics from this hospitalization (including imaging, microbiology, ancillary and laboratory) are listed below for reference.    Significant Diagnostic Studies: Dg Chest 2 View  10/19/2013   CLINICAL DATA:  COPD exacerbation with cough, wheezing, and shortness of breath.  EXAM: CHEST  2 VIEW  COMPARISON:  12/13/ 2014, 11/27/2012, 10/29/2012.  FINDINGS: Emphysematous changes throughout both lungs, particularly upper lobes, with associated hyperinflation. Moderate central peribronchial thickening, more so than on the 2013 examination, similar to yesterday's examination. Lungs otherwise clear. No localized airspace consolidation. No pleural effusions. No pneumothorax. Normal pulmonary vascularity. Cardiac silhouette normal in size. Thoracic aorta atherosclerotic, unchanged. Enlarged left main pulmonary artery, slightly more dilated than on the 2013 examination.  IMPRESSION: 1. Mild to moderate changes of acute bronchitis and/or asthma superimposed upon COPD/emphysema. No localized airspace pneumonia. 2. Enlarged left main pulmonary artery. This is nonspecific but can be seen in patients with aortic stenosis. Clinical correlation.   Electronically Signed   By: Hulan Saas  M.D.   On: 10/19/2013 12:13    Microbiology: Recent Results (from the past 240 hour(s))  CULTURE, BLOOD (ROUTINE X 2)     Status: None   Collection Time    10/19/13  3:26 PM      Result Value Range Status   Specimen Description BLOOD LEFT ARM   Final   Special Requests BOTTLES DRAWN AEROBIC AND ANAEROBIC 5CC   Final   Culture  Setup Time     Final   Value: 10/19/2013 21:29     Performed at [facility]   Culture     Final   Value:        BLOOD CULTURE RECEIVED NO GROWTH TO DATE CULTURE WILL BE HELD FOR 5 DAYS BEFORE ISSUING A FINAL NEGATIVE REPORT     Performed at [facility]   Report Status PENDING   Incomplete  CULTURE, BLOOD (ROUTINE X 2)     Status: None   Collection Time    10/19/13  3:37 PM      Result Value Range Status   Specimen Description BLOOD RIGHT ARM   Final   Special Requests BOTTLES DRAWN AEROBIC AND ANAEROBIC 5CC   Final   Culture  Setup Time     Final   Value: 10/19/2013 21:28     Performed at [facility]   Culture     Final   Value:        BLOOD CULTURE RECEIVED NO GROWTH TO DATE CULTURE WILL BE HELD FOR 5 DAYS BEFORE ISSUING A FINAL NEGATIVE REPORT     Performed at [facility]   Report Status PENDING   Incomplete  CULTURE, EXPECTORATED SPUTUM-ASSESSMENT     Status: None   Collection Time    10/19/13  5:11 PM      Result Value Range Status   Specimen Description SPUTUM   Final   Special Requests NONE   Final   Sputum evaluation     Final   Value: THIS SPECIMEN IS ACCEPTABLE. RESPIRATORY CULTURE REPORT TO FOLLOW.   Report Status 10/19/2013 FINAL   Final  CULTURE, RESPIRATORY (NON-EXPECTORATED)     Status: None   Collection Time    10/19/13  5:11 PM      Result Value Range Status   Specimen Description SPUTUM  Final   Special Requests NONE   Final   Gram Stain     Final   Value: ABUNDANT WBC PRESENT, PREDOMINANTLY PMN     NO SQUAMOUS EPITHELIAL CELLS SEEN     MODERATE GRAM NEGATIVE COCCOBACILLI     RARE GRAM  POSITIVE COCCI     IN PAIRS   Culture     Final   Value: MODERATE MORAXELLA CATARRHALIS(BRANHAMELLA)     Note: BETA LACTAMASE POSITIVE     Performed at [facility]   Report Status 10/21/2013 FINAL   Final  RESPIRATORY VIRUS PANEL     Status: None   Collection Time    10/19/13  5:12 PM      Result Value Range Status   Source - RVPAN NASAL SWAB   Corrected   Comment: CORRECTED ON 12/16 AT 1833: PREVIOUSLY REPORTED AS NASAL SWAB   Respiratory Syncytial Virus A NOT DETECTED   Final   Respiratory Syncytial Virus B NOT DETECTED   Final   Influenza A NOT DETECTED   Final   Influenza B NOT DETECTED   Final   Parainfluenza 1 NOT DETECTED   Final   Parainfluenza 2 NOT DETECTED   Final   Parainfluenza 3 NOT DETECTED   Final   Metapneumovirus NOT DETECTED   Final   Rhinovirus NOT DETECTED   Final   Adenovirus NOT DETECTED   Final   Influenza A H1 NOT DETECTED   Final   Influenza A H3 NOT DETECTED   Final   Comment: (NOTE)           Normal Reference Range for each Analyte: NOT DETECTED     Testing performed using the Luminex xTAG Respiratory Viral Panel test     kit.     This test was developed and its performance characteristics determined     by [facility]. It has not been cleared or approved by the [place]     Food and Drug Administration. This test is used for clinical purposes.     It should not be regarded as investigational or for research. This     laboratory is certified under the Clinical Laboratory Improvement     Amendments of 1988 (CLIA) as qualified to perform high complexity     clinical laboratory testing.     Performed at [facility]: Basic Metabolic Panel:  Recent Labs Lab 10/19/13 1131 10/20/13 0515 10/21/13 0519 10/22/13 0535  NA 137 140 138 140  K 4.0 4.7 4.5 4.2  CL 94* 99 99 102  CO2 31 32 31 31  GLUCOSE 164* 162* 140* 131*  BUN 21 18 20  24*  CREATININE 0.63 0.67 0.68 0.66  CALCIUM 10.5 9.8 9.4 9.2   Liver Function  Tests: No results found for this basename: AST, ALT, ALKPHOS, BILITOT, PROT, ALBUMIN,  in the last 168 hours No results found for this basename: LIPASE, AMYLASE,  in the last 168 hours No results found for this basename: AMMONIA,  in the last 168 hours CBC:  Recent Labs Lab 10/19/13 1131 10/20/13 0515 10/21/13 0519 10/22/13 0535  WBC 17.1* 10.8* 11.8* 8.5  NEUTROABS 15.4*  --   --   --   HGB 11.7* 10.0* 10.8* 10.2*  HCT 35.2* 30.9* 32.4* 31.6*  MCV 97.8 98.7 97.3 99.1  PLT 220 196 218 216   Cardiac Enzymes: No results found for this basename: CKTOTAL, CKMB, CKMBINDEX, TROPONINI,  in the last 168 hours  BNP: BNP (last 3 results)  Recent Labs  10/29/12 0935  PROBNP 352.9*   CBG: No results found for this basename: GLUCAP,  in the last 168 hours     Signed:  Jeralyn Bennett  [facility] 10/24/2013, 10:59 AM

## 2013-10-25 LAB — CULTURE, BLOOD (ROUTINE X 2): Culture: NO GROWTH

## 2013-12-15 ENCOUNTER — Telehealth: Payer: Self-pay | Admitting: Pulmonary Disease

## 2013-12-15 NOTE — Telephone Encounter (Signed)
So does she have prednisone left over, or is she currently taking prednisone on a scheduled taper??

## 2013-12-15 NOTE — Telephone Encounter (Signed)
Pt is calling back. [place] please advise!

## 2013-12-15 NOTE — Telephone Encounter (Signed)
Pt still has prednisone left. Advised her to take taper - 4 x 3 days, 3 x 3 days. 2 x 3 days, 1 x 3 days.

## 2013-12-15 NOTE — Telephone Encounter (Signed)
Pt calling again in ref to previous msg can be reached at [phone].[name]

## 2013-12-15 NOTE — Telephone Encounter (Signed)
Spoke with pt. Reports SOB with exertion x3 days. Chest tightness is also present. Denies coughing or wheezing. Went to urgent care a few weeks ago with the same symptoms. Was given pred taper but eventually ended up in the ED. She still has 36 tabs of pred left. Wants [facility] recs - should she come in for OV? Finish what pred she has?  [place] - please advise. Thanks.

## 2014-01-03 IMAGING — CR DG CHEST 2V
2 series · 2 of 2 positions shown · non-contrast
Comparison: 08/10/2008

CLINICAL DATA: Shortness of breath

CHEST - 2 VIEW

[x chest ap]
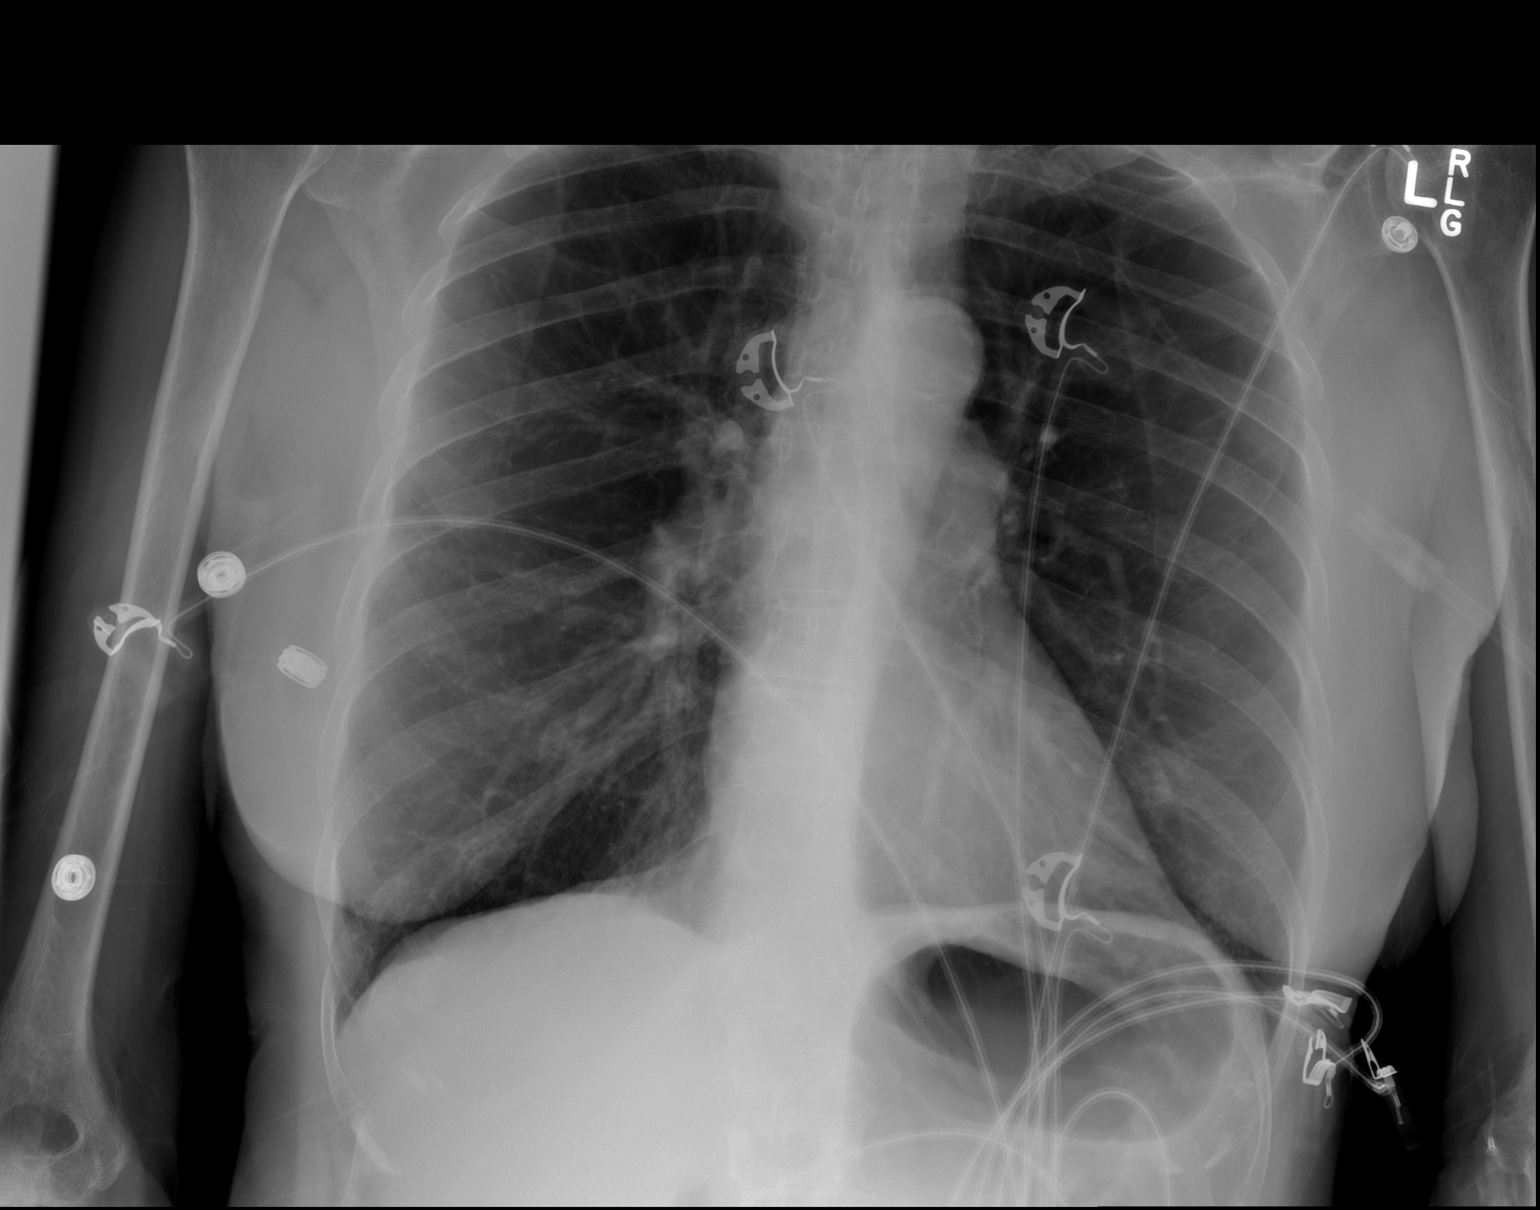

[w chest lat]
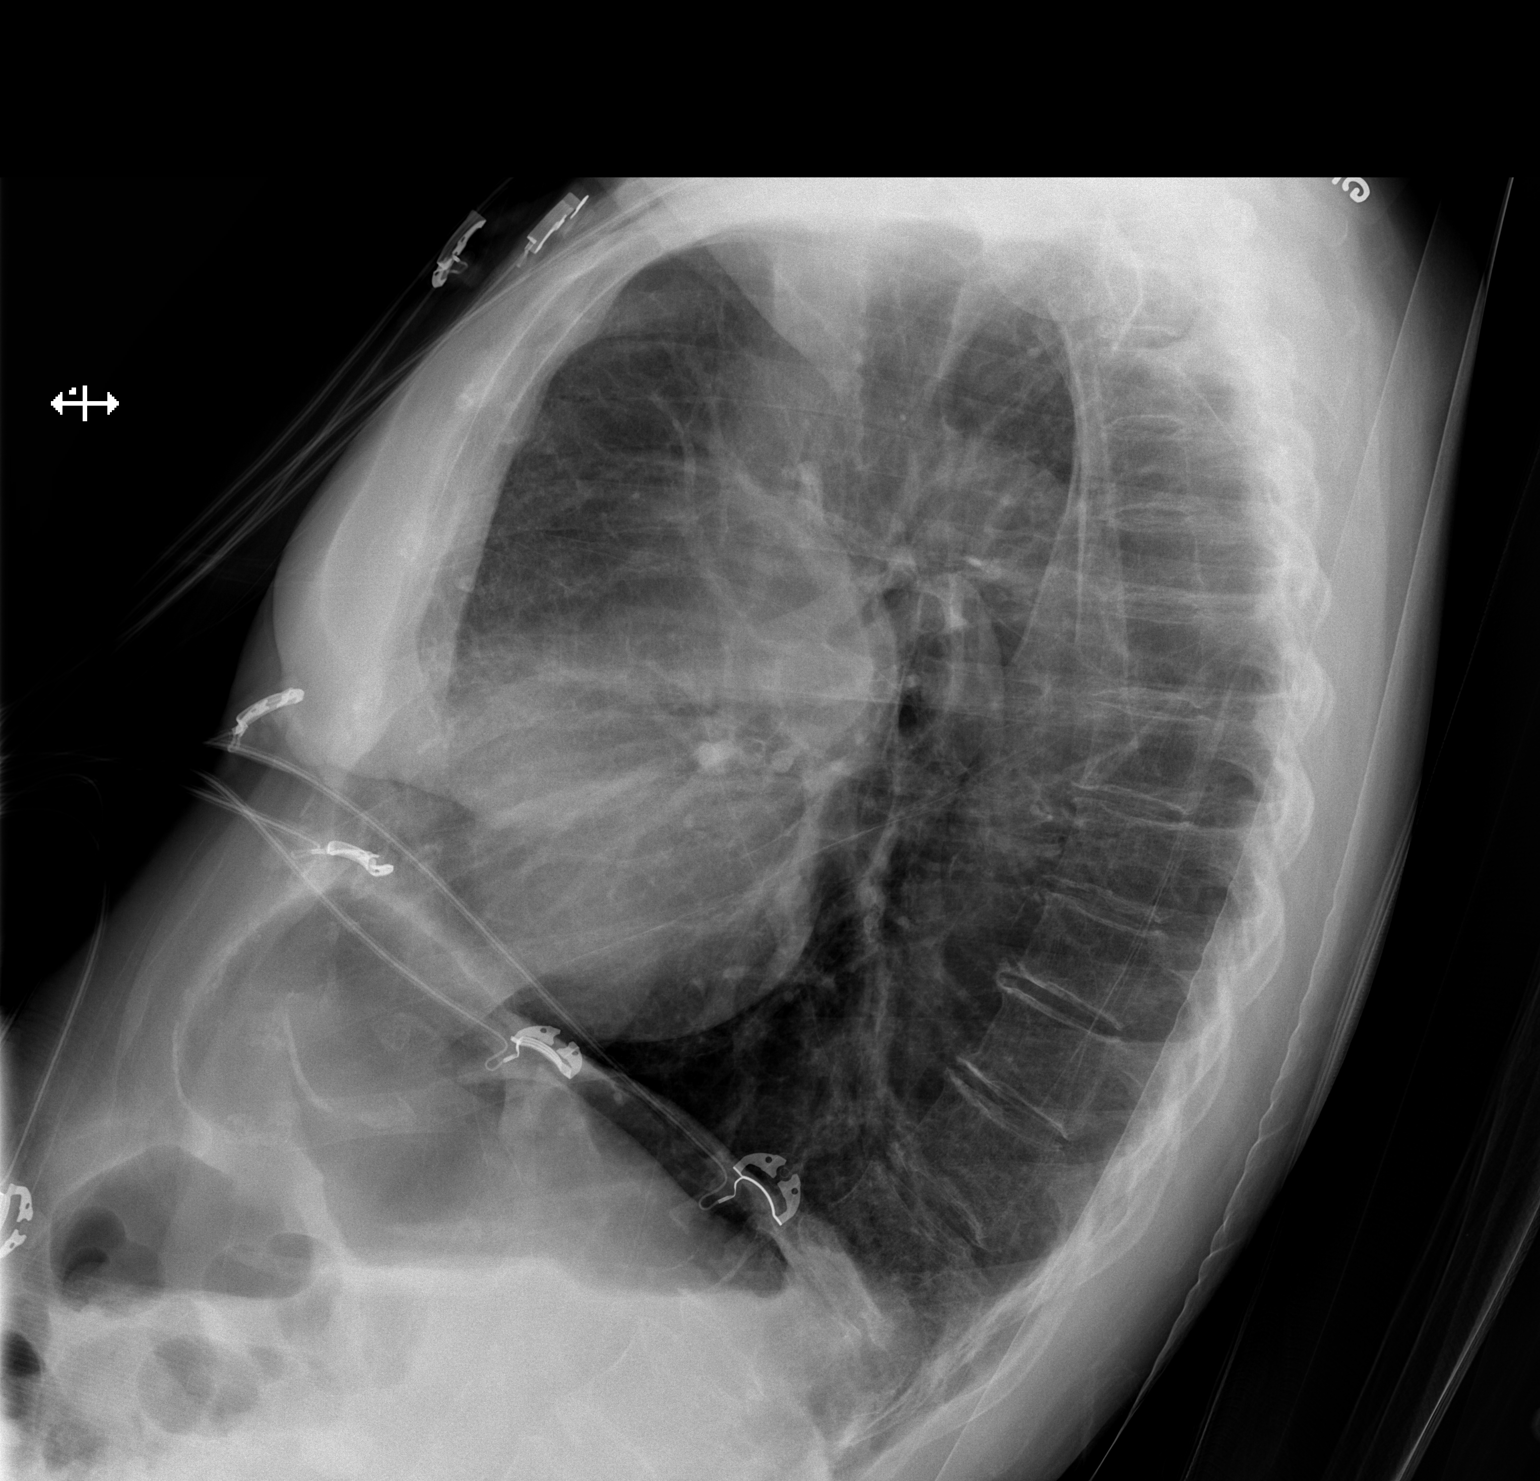

[2 of 2 positions shown; findings below may reference images not displayed]

FINDINGS: Cardiomediastinal silhouette is stable.  No acute
infiltrate or pleural effusion.  No pulmonary edema.  Mild
hyperinflation again noted.  Thoracic spine osteopenia.  Mild
degenerative changes lower thoracic spine.
IMPRESSION: No active disease.  Mild hyperinflation again noted.

## 2014-01-07 IMAGING — CR DG ABDOMEN 2V
2 series · 2 of 2 positions shown · non-contrast
Comparison: 07/22/2012

CLINICAL DATA: Constipation, abdominal pain, bloating.

ABDOMEN - 2 VIEW

[t abdomen supine]
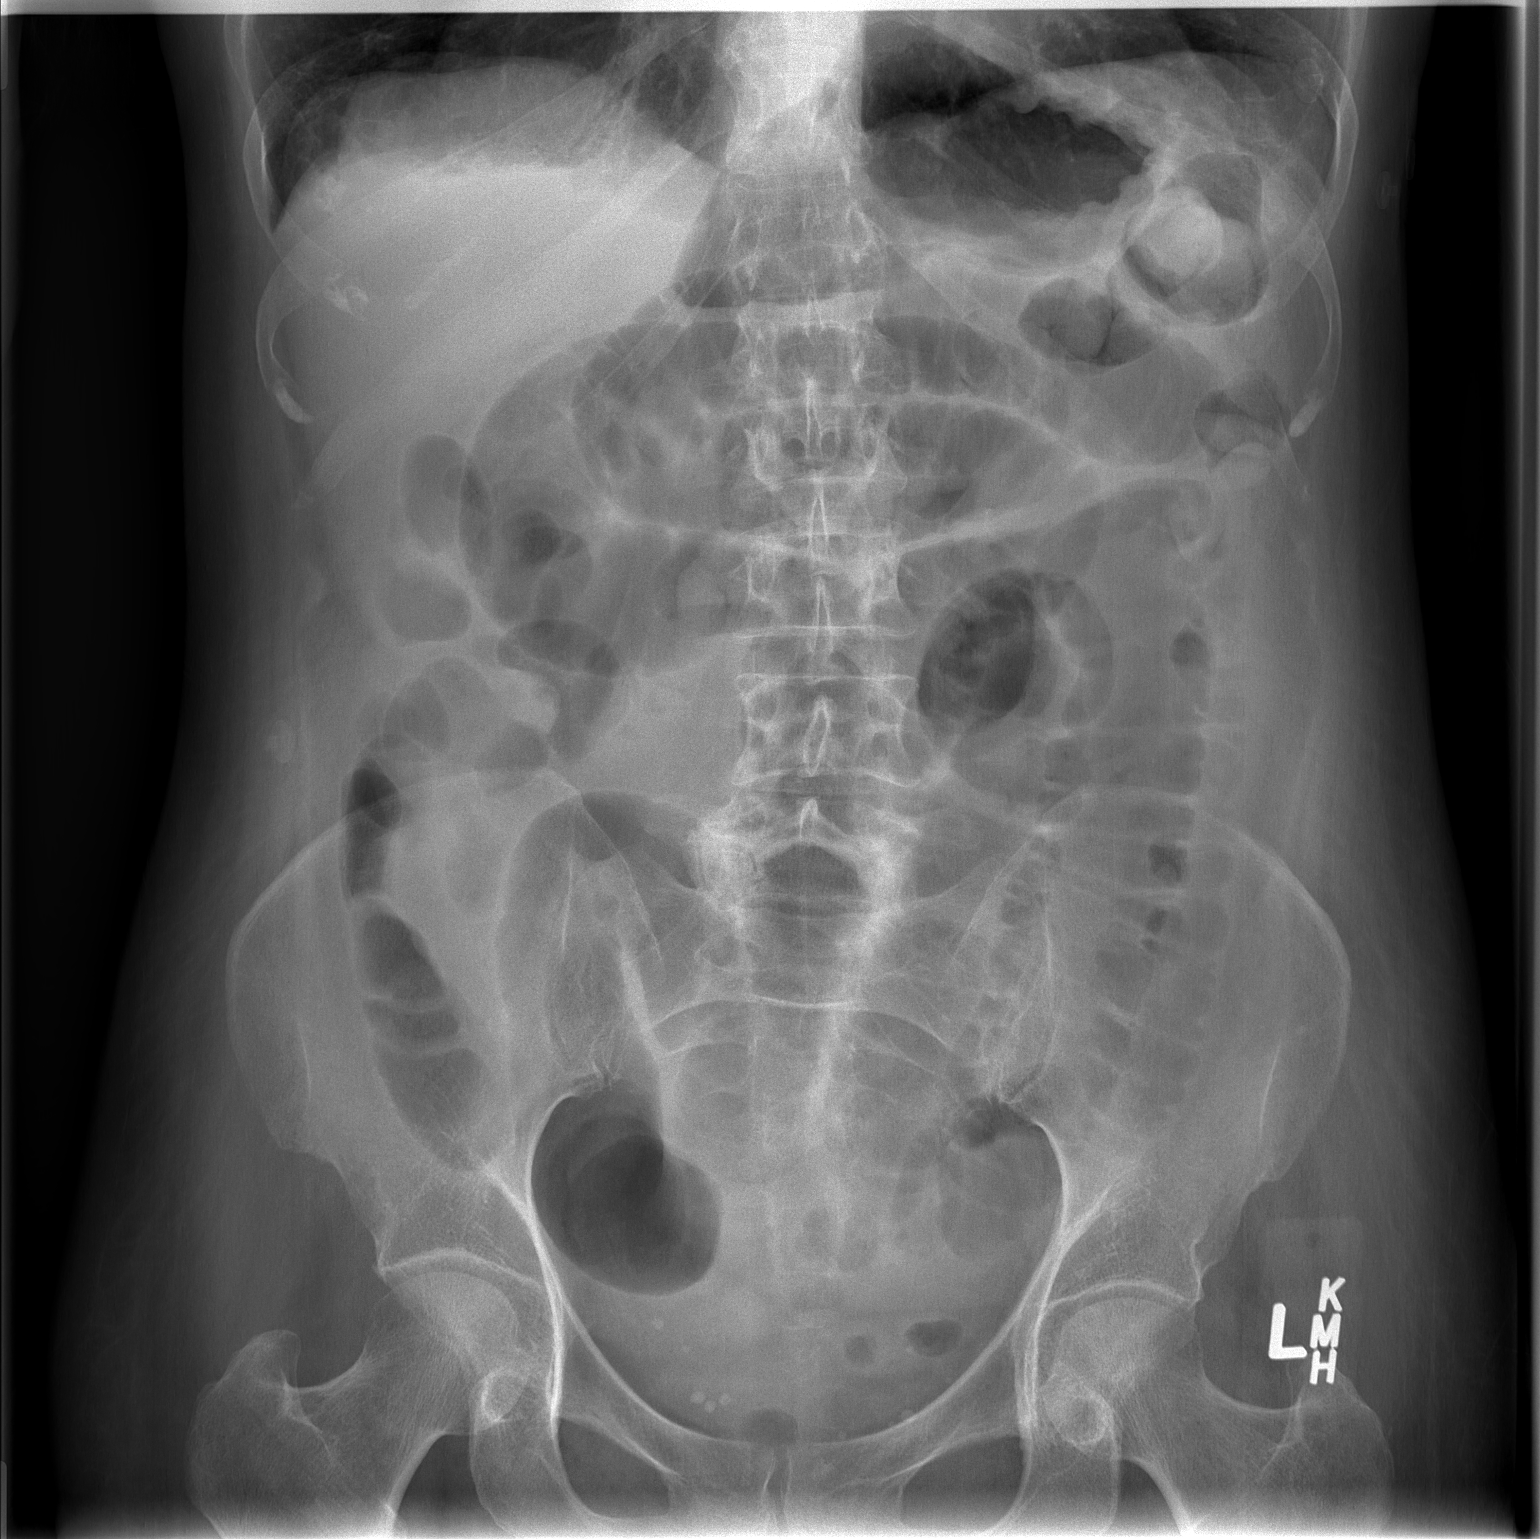

[w abdomen upright]
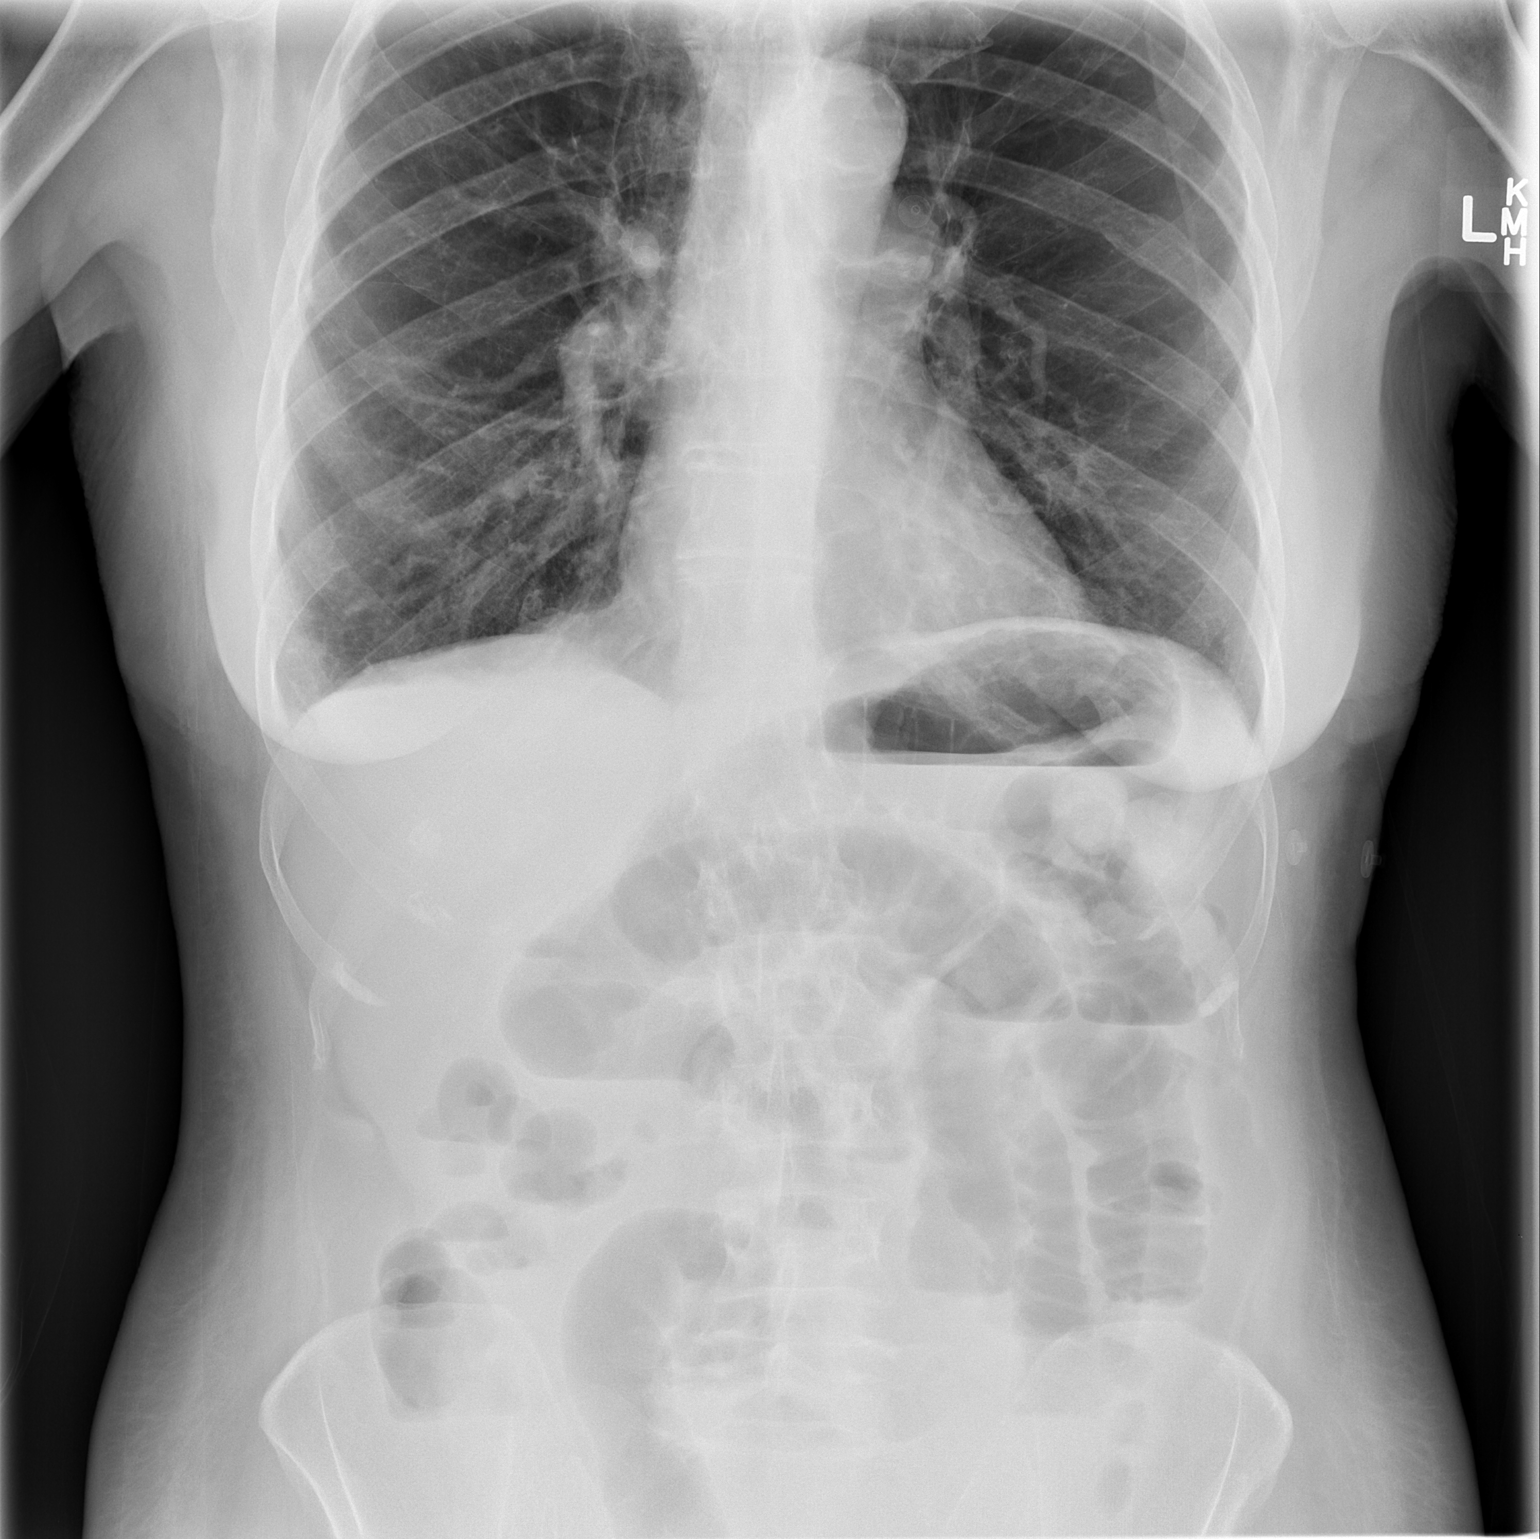

[2 of 2 positions shown; findings below may reference images not displayed]

FINDINGS: Dilated small bowel loops throughout the abdomen and
pelvis concerning for small bowel obstruction.  Gas within
nondistended colon.  No organomegaly or free air.  No acute bony
abnormality.
IMPRESSION: [Dilated small bowel loops concerning for small bowel obstruction.

## 2014-01-12 ENCOUNTER — Encounter: Payer: Self-pay | Admitting: Pulmonary Disease

## 2014-01-12 ENCOUNTER — Ambulatory Visit (INDEPENDENT_AMBULATORY_CARE_PROVIDER_SITE_OTHER): Payer: Medicare Other | Admitting: Pulmonary Disease

## 2014-01-12 ENCOUNTER — Telehealth: Payer: Self-pay | Admitting: Pulmonary Disease

## 2014-01-12 VITALS — BP 116/74 | HR 93 | Temp 97.8°F | Ht 61.0 in | Wt 97.4 lb

## 2014-01-12 DIAGNOSIS — J438 Other emphysema: Secondary | ICD-10-CM

## 2014-01-12 DIAGNOSIS — J439 Emphysema, unspecified: Secondary | ICD-10-CM

## 2014-01-12 MED ORDER — ALBUTEROL SULFATE HFA 108 (90 BASE) MCG/ACT IN AERS: 2.0000 | INHALATION_SPRAY | Freq: Four times a day (QID) | RESPIRATORY_TRACT | Status: DC | PRN

## 2014-01-12 MED ORDER — TIOTROPIUM BROMIDE MONOHYDRATE 18 MCG IN CAPS
18.0000 ug | ORAL_CAPSULE | Freq: Every day | RESPIRATORY_TRACT | Status: DC
Start: 2014-01-12 — End: 2014-12-23

## 2014-01-12 MED ORDER — BUDESONIDE-FORMOTEROL FUMARATE 160-4.5 MCG/ACT IN AERO: 2.0000 | INHALATION_SPRAY | Freq: Two times a day (BID) | RESPIRATORY_TRACT | Status: DC

## 2014-01-12 MED ORDER — ALBUTEROL SULFATE (2.5 MG/3ML) 0.083% IN NEBU: 2.5000 mg | INHALATION_SOLUTION | RESPIRATORY_TRACT | Status: DC | PRN

## 2014-01-12 NOTE — Addendum Note (Signed)
Addended by: Virl Cagey on: 01/12/2014 02:38 PM   Modules accepted: Orders

## 2014-01-12 NOTE — Patient Instructions (Signed)
Will send in prednisone 10mg  (#10).  Do not take unless having significant breathing issues.  If during the week call us first.  If weekend, take 40mg  each day until the beginning of week. No change in breathing medications followup with me again in 20mos.

## 2014-01-12 NOTE — Progress Notes (Signed)
   Subjective:    Patient ID: [name], female    DOB: 1941/09/16, 73 y.o.   MRN: [id number]  HPI Patient comes in today for followup of her known severe COPD with chronic respiratory failure. She is getting over a recent prolonged exacerbation, but is finally starting to return to baseline. Her strength and endurance have not normalized, but clearly her getting better. She denies any chest congestion or purulence.   Review of Systems  Constitutional: Negative for fever and unexpected weight change.  HENT: Negative for congestion, dental problem, ear pain, nosebleeds, postnasal drip, rhinorrhea, sinus pressure, sneezing, sore throat and trouble swallowing.   Eyes: Negative for redness and itching.  Respiratory: Negative for cough, chest tightness, shortness of breath and wheezing.   Cardiovascular: Negative for palpitations and leg swelling.  Gastrointestinal: Negative for nausea and vomiting.  Genitourinary: Negative for dysuria.  Musculoskeletal: Negative for joint swelling.  Skin: Negative for rash.  Neurological: Negative for headaches.  Hematological: Does not bruise/bleed easily.  Psychiatric/Behavioral: Negative for dysphoric mood. The patient is not nervous/anxious.        Objective:   Physical Exam Thin and frail female in no acute distress Nose without purulence or discharge noted Neck without lymphadenopathy or thyromegaly Chest with very diminished breath sounds throughout, no wheezing Cardiac exam with regular rate and rhythm, 2/6 systolic murmur Lower extremities without edema, no cyanosis Alert and oriented, moves all 4 extremities.        Assessment & Plan:

## 2014-01-12 NOTE — Assessment & Plan Note (Signed)
The patient is doing well after a recent acute exacerbation, and is slowly returning back to her usual baseline. I have asked her to continue on her maintenance bronchodilators, and to work on improving her strength and endurance is much as possible.

## 2014-01-12 NOTE — Telephone Encounter (Signed)
i called and spoke with pt. She needed her spiriva and albuterol sent to the pharm and have them place on hold. i have done so. Nothing further needed

## 2014-01-13 IMAGING — CR DG CHEST 1V PORT
1 series · 1 of 1 positions shown · non-contrast
Comparison: Chest radiograph performed 11/07/2012

CLINICAL DATA: Endotracheal tube placement.

PORTABLE CHEST - 1 VIEW

[AP]
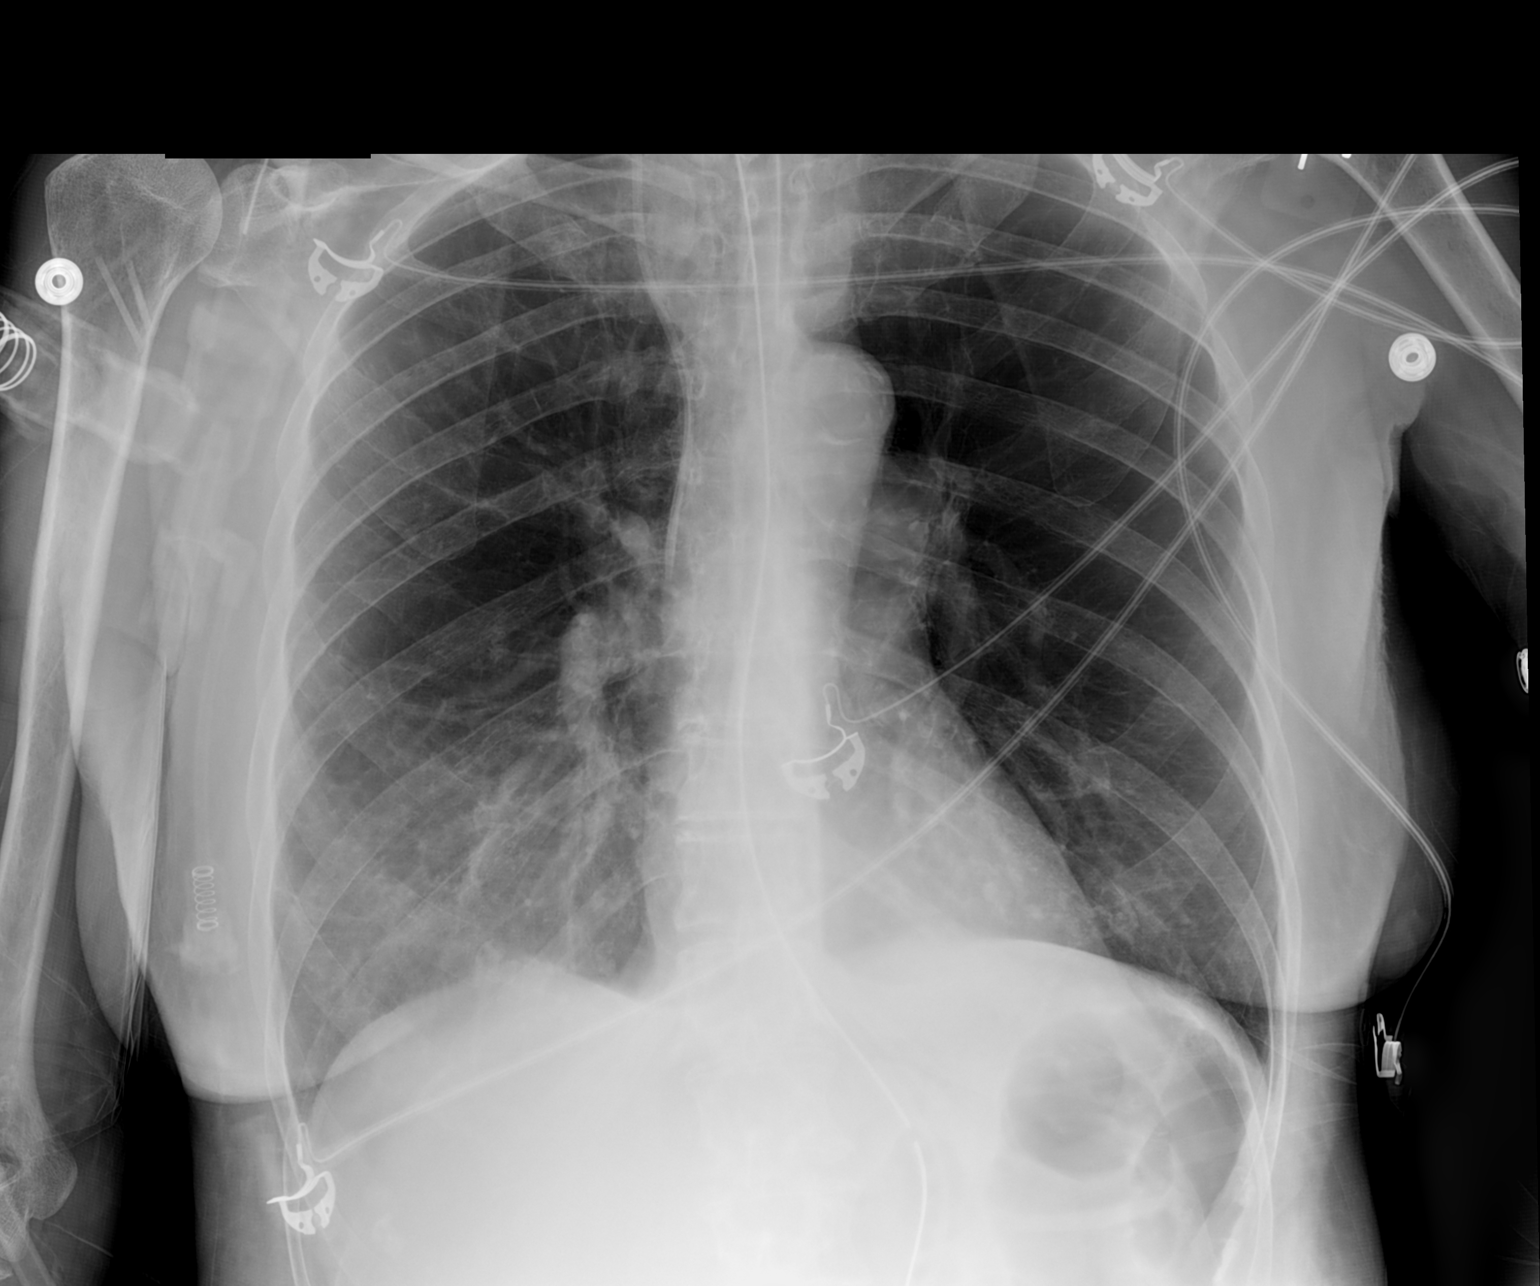

[1 of 1 positions shown; findings below may reference images not displayed]

FINDINGS: The patient's endotracheal tube is seen ending 4-5 cm
above the carina.  An enteric tube is noted extending below the
diaphragm.  A right IJ line is noted ending about the mid SVC.

Lucency at the upper lung zones suggests underlying emphysema.  The
lungs are otherwise grossly clear.  No focal consolidation, pleural
effusion or pneumothorax is seen.

The cardiomediastinal silhouette is normal in size; calcification
is noted within the aortic arch.  No acute osseous abnormalities
are identified.
IMPRESSION: 1.  Endotracheal tube seen ending 4-5 cm above the carina.
2.  Underlying emphysema noted; lungs otherwise grossly clear.

## 2014-05-12 ENCOUNTER — Telehealth: Payer: Self-pay | Admitting: Pulmonary Disease

## 2014-05-12 MED ORDER — PREDNISONE 10 MG PO TABS: ORAL_TABLET | ORAL | Status: DC

## 2014-05-12 NOTE — Telephone Encounter (Signed)
Ok to call in an 8 day taper of prednisone.  She is to call us if not getting back to usual self.

## 2014-05-12 NOTE — Telephone Encounter (Signed)
Called and spoke to pt. Informed pt that we can send in a pred taper to her preferred pharmacy. Informed pt that if she is not feeling better then to call us back, pt verbalized understanding and denied any further questions or concerns at this time. Rx sent in.

## 2014-05-12 NOTE — Telephone Encounter (Signed)
Last ov w/ [facility] 3.9.15: Patient Instructions     Will send in prednisone 10mg  (#10). Do not take unless having significant breathing issues. If during the week call us first. If weekend, take 40mg  each day until the beginning of week.  No change in breathing medications  followup with me again in 4mos.    Called spoke with who c/o chest tightness, slight dry cough, DOE x1 yesterday, tightness worse today.  Denies f/c/s/n/v, purulent sputum, PND, hemoptysis, leg swelling.  Dr Gwenette Greet please advise, thank you. [facility] [address] Allergies  Allergen Reactions  . Oxycodone-Acetaminophen     REACTION: headaches,nausea

## 2014-07-15 ENCOUNTER — Ambulatory Visit (INDEPENDENT_AMBULATORY_CARE_PROVIDER_SITE_OTHER): Payer: Medicare Other | Admitting: Pulmonary Disease

## 2014-07-15 ENCOUNTER — Encounter: Payer: Self-pay | Admitting: Pulmonary Disease

## 2014-07-15 VITALS — BP 124/72 | HR 116 | Temp 98.2°F | Ht 61.0 in | Wt 95.6 lb

## 2014-07-15 DIAGNOSIS — J9610 Chronic respiratory failure, unspecified whether with hypoxia or hypercapnia: Secondary | ICD-10-CM | POA: Insufficient documentation

## 2014-07-15 DIAGNOSIS — R0902 Hypoxemia: Secondary | ICD-10-CM

## 2014-07-15 DIAGNOSIS — J9611 Chronic respiratory failure with hypoxia: Secondary | ICD-10-CM

## 2014-07-15 DIAGNOSIS — J438 Other emphysema: Secondary | ICD-10-CM

## 2014-07-15 DIAGNOSIS — J439 Emphysema, unspecified: Secondary | ICD-10-CM

## 2014-07-15 HISTORY — DX: Chronic respiratory failure, unspecified whether with hypoxia or hypercapnia: J96.10

## 2014-07-15 MED ORDER — ALBUTEROL SULFATE (2.5 MG/3ML) 0.083% IN NEBU: 2.5000 mg | INHALATION_SOLUTION | Freq: Four times a day (QID) | RESPIRATORY_TRACT | Status: DC | PRN

## 2014-07-15 MED ORDER — ALBUTEROL SULFATE HFA 108 (90 BASE) MCG/ACT IN AERS: 2.0000 | INHALATION_SPRAY | Freq: Four times a day (QID) | RESPIRATORY_TRACT | Status: DC | PRN

## 2014-07-15 MED ORDER — BENZONATATE 100 MG PO CAPS: 100.0000 mg | ORAL_CAPSULE | Freq: Three times a day (TID) | ORAL | Status: DC | PRN

## 2014-07-15 MED ORDER — PREDNISONE 10 MG PO TABS
ORAL_TABLET | ORAL | Status: DC
Start: 2014-07-15 — End: 2014-11-30

## 2014-07-15 NOTE — Addendum Note (Signed)
Addended by: Inge Rise on: 07/15/2014 02:45 PM   Modules accepted: Orders

## 2014-07-15 NOTE — Progress Notes (Signed)
   Subjective:    Patient ID: [name], female    DOB: August 26, 1941, 73 y.o.   MRN: [id number]  HPI The patient comes in today for followup of her known severe COPD with chronic respiratory failure. She has continued on her bronchodilator regimen, but currently feels that her breathing is a little off from her normal baseline. She is concerned about the upcoming weekend, and being able to get medications if she gets worse. I have called in prednisone for her in the past to hold. She denies any significant cough, mucus, or purulence. She has been staying on her oxygen compliantly. The patient tells me that she is are gotten her flu shot today.   Review of Systems  Constitutional: Negative for fever and unexpected weight change.  HENT: Positive for congestion, postnasal drip, rhinorrhea, sinus pressure and sneezing. Negative for dental problem, ear pain, nosebleeds, sore throat and trouble swallowing.   Eyes: Positive for itching. Negative for redness.  Respiratory: Positive for cough, chest tightness and shortness of breath. Negative for wheezing.   Cardiovascular: Negative for palpitations and leg swelling.  Gastrointestinal: Negative for nausea and vomiting.  Genitourinary: Negative for dysuria.  Musculoskeletal: Negative for joint swelling.  Skin: Negative for rash.  Neurological: Negative for headaches.  Hematological: Does not bruise/bleed easily.  Psychiatric/Behavioral: Negative for dysphoric mood. The patient is not nervous/anxious.        Objective:   Physical Exam Thin female in no acute distress Nose without purulence or discharge noted Neck without lymphadenopathy or thyromegaly Chest with very decreased breath sounds diffusely, no active wheezing Cardiac exam with distant heart sounds, but regular Lower extremities with minimal edema and varicosities, no cyanosis Alert and oriented, moves all 4 extremities.       Assessment & Plan:

## 2014-07-15 NOTE — Patient Instructions (Signed)
Will give you a sample of spiriva respimat to try for 2 weeks.  Take 2 puffs each am in the place of your current spiriva.  If you would like to switch, let us know and we can send in prescription. Will send in 8 day prednisone taper for you to hold.  Do not start unless you are having a significant worsening of your breathing. followup with me again in 83mos, but call if having worsening breathing issues.

## 2014-07-15 NOTE — Addendum Note (Signed)
Addended by: Inge Rise on: 07/15/2014 02:27 PM   Modules accepted: Orders

## 2014-07-15 NOTE — Assessment & Plan Note (Signed)
The patient appears to be stable from a COPD standpoint, but feels that she has had worsening breathing the last few days. I will give her a prescription for prednisone taper, and have asked her to hold this and not take it unless she gets worse. I have asked her to continue on her bronchodilator regimen, but will try her on the Respimat version of Spiriva.

## 2014-08-12 ENCOUNTER — Telehealth: Payer: Self-pay | Admitting: Pulmonary Disease

## 2014-08-12 NOTE — Telephone Encounter (Signed)
Pt returned call & can be reached at [phone].  [name]

## 2014-08-12 NOTE — Telephone Encounter (Signed)
lmomtcb x1 for pt on both #'s listed

## 2014-08-12 NOTE — Telephone Encounter (Signed)
Can do one of two things:  1) wait longer since she just started taking pred taper, and can take mucinex to help with head congestion.  Would need to stay at home and do nothing for a few days  2) can get her in for OV  Either way, but if her symptoms worsen during the night, needs to go to ER.

## 2014-08-12 NOTE — Telephone Encounter (Signed)
I called spoke with pt. She reports she saw PCP today and was RX'd ZPAK. Was told she had sinus infection. Pt was also given a depo medrol. She will call us if she does not get better

## 2014-08-12 NOTE — Telephone Encounter (Signed)
Called and spoke to pt. Pt just started taking the 8 day pred taper yesterday, 10/6, for an increase in SOB. Pt c/o pressure above left eye that is also causing a headache and chest tightness when active. Pt denies cough and f/c/s. Pt was able to form complete sentences when speaking. Pt last seen by [facility] on 07/15/2014. Pt requesting recs by [place].  [place] please advise.  Allergies  Allergen Reactions  . Oxycodone-Acetaminophen     REACTION: headaches,nausea

## 2014-09-08 ENCOUNTER — Other Ambulatory Visit: Payer: Self-pay | Admitting: Pulmonary Disease

## 2014-11-10 ENCOUNTER — Telehealth: Payer: Self-pay | Admitting: Pulmonary Disease

## 2014-11-10 NOTE — Telephone Encounter (Signed)
Pt states that she has an OV with Dr Gwenette Greet 11/11/14 at 12p.  Pt c/o increased SOB. Was seen by PCP Dr Rex Kras 11/05/14 for same symptoms and was given Pred taper(12-day), ZPAK and Depo injection. Pt states that she is now on the 40mg  dose of the Prednisone - started at 60mg  x 4 days - pt reports having 40mg  x 4 days, then 20mg  x 4 days left until complete. Pt has completed the ZPAK abx. Pt states that she is not noticing much difference in her symptoms even with the prednisone. Wants to know if increasing her O2 to 3L might help with her SOB -- Requesting to turn O2 from 2L to 3L/min until OV tomorrow. Please advise Dr Gwenette Greet. Thanks.

## 2014-11-10 NOTE — Telephone Encounter (Signed)
Called and spoke with pt and she is aware of [place] recs to increase her oxygen to 3 liters only until her appt tomorrow.  Pt is aware and nothing further is needed.

## 2014-11-10 NOTE — Telephone Encounter (Signed)
Ok to increase her oxygen only to 3 liters until our visit tomorrow.  No higher.

## 2014-11-11 ENCOUNTER — Encounter: Payer: Self-pay | Admitting: Pulmonary Disease

## 2014-11-11 ENCOUNTER — Ambulatory Visit (INDEPENDENT_AMBULATORY_CARE_PROVIDER_SITE_OTHER)
Admission: RE | Admit: 2014-11-11 | Discharge: 2014-11-11 | Disposition: A | Discharge status: Home / Self Care | Payer: Medicare Other | Source: Ambulatory Visit | Attending: Pulmonary Disease | Admitting: Pulmonary Disease

## 2014-11-11 ENCOUNTER — Ambulatory Visit (INDEPENDENT_AMBULATORY_CARE_PROVIDER_SITE_OTHER): Payer: Medicare Other | Admitting: Pulmonary Disease

## 2014-11-11 VITALS — BP 112/62 | HR 84 | Temp 98.2°F | Ht 61.5 in | Wt 93.0 lb

## 2014-11-11 DIAGNOSIS — J438 Other emphysema: Secondary | ICD-10-CM

## 2014-11-11 DIAGNOSIS — J9611 Chronic respiratory failure with hypoxia: Secondary | ICD-10-CM

## 2014-11-11 NOTE — Addendum Note (Signed)
Addended by: Mathis Dad on: 11/11/2014 12:41 PM   Modules accepted: Orders

## 2014-11-11 NOTE — Addendum Note (Signed)
Addended by: Mathis Dad on: 11/11/2014 12:47 PM   Modules accepted: Orders

## 2014-11-11 NOTE — Assessment & Plan Note (Signed)
The pt is having worsening and persistent sob, along with significant exertional chest tightness, despite being on an aggressive prednisone taper and bronchodilator regimen.  She is not having a chest infection, and no wheezing on exam.  It is unclear if this is just the natural h/o her copd, and that she is getting worse?  Or is this something different such as thromboembolic disease or coronary artery disease/anginal equivalent?  She does have a lot of anxiety, and this can also contribute to her symptoms.  It really bothers me that she is not improving with high dose steroids, when in the past she has had a very good response.  Will check cxr now, but if unremarkable, will need to evaluate for TE disease and possible CAD.  She understands this may represent worsening of her underlying disease, and if so, we may be getting to the point of considering hospice referral.

## 2014-11-11 NOTE — Progress Notes (Signed)
   Subjective:    Patient ID: [name], female    DOB: [birth year], 74 y.o.   MRN: [id number]  HPI The patient comes in today for an acute sick visit. She has known severe COPD with chronic respiratory failure, and has been having ongoing issues the last few months with increasing shortness of breath, chest tightness, and most recently some atypical left arm/shoulder discomfort. She was treated with a course of prednisone and antibiotics in October and the beginning of December, and felt that she returned to baseline. However, she began to have worsening symptoms the end of last month, and was placed on an aggressive steroid taper by her primary physician. Despite being on this, she has seen no improvement in her breathing over the last 6 days. She has mild dyspnea at rest, but gets significantly more short of breath with chest tightness with any type of exertion. Tightness greatly improves with rest. She has no history of thromboembolic disease, but has had some ankle and pedal edema. She denies any chest congestion, cough, or purulence. She does not think she has a chest cold at this point. She has turned her oxygen level up to 3 L, but her sats are 98% today. I have asked her to turn it back provided that she stays above 90%. She has a long history of severe anxiety, but does not feel that she is overly anxious at this time.   Review of Systems  Constitutional: Negative for fever and unexpected weight change.  HENT: Positive for postnasal drip. Negative for congestion, dental problem, ear pain, nosebleeds, rhinorrhea, sinus pressure, sneezing, sore throat and trouble swallowing.   Eyes: Negative for redness and itching.  Respiratory: Positive for chest tightness and shortness of breath. Negative for cough and wheezing.   Cardiovascular: Positive for leg swelling. Negative for palpitations.  Gastrointestinal: Negative for nausea and vomiting.  Genitourinary: Negative for dysuria.    Musculoskeletal: Negative for joint swelling.  Skin: Negative for rash.  Neurological: Negative for headaches.  Hematological: Does not bruise/bleed easily.  Psychiatric/Behavioral: Negative for dysphoric mood. The patient is not nervous/anxious.        Objective:   Physical Exam Thin and frail-appearing female in no acute distress Nose without purulence or discharge noted Neck without lymphadenopathy or thyromegaly Chest with very diminished breath sounds throughout, no active wheezing Cardiac exam with regular rate and rhythm Lower extremities with mild ankle and pedal edema, no calf tenderness, no cyanosis. Alert and oriented, moves all 4 extremities.       Assessment & Plan:

## 2014-11-11 NOTE — Patient Instructions (Signed)
Stay on oxygen at 2 liters unless your oxygen saturations are less than 90% Finish up prednisone taper given to you by Dr. Rex Kras. No change in breathing medications Will check chest xray today.  If nothing is seen to explain your shortness of breath, will need to check scan of your chest to make sure you are not having blood clots in the lungs.  If this is unremarkable, would consider doing cardiac stress testing to make sure you do not have blockages.  Would discuss with Dr. Rex Kras.  Please call us if you feel your breathing is worsening while we are evaluating your ongoing symptoms.

## 2014-11-12 ENCOUNTER — Telehealth: Payer: Self-pay | Admitting: Pulmonary Disease

## 2014-11-12 DIAGNOSIS — R0789 Other chest pain: Secondary | ICD-10-CM

## 2014-11-12 DIAGNOSIS — R0602 Shortness of breath: Secondary | ICD-10-CM

## 2014-11-12 NOTE — Progress Notes (Signed)
Quick Note:  Called and spoke to pt. Informed pt of the results and recs per [facility]. Order placed for CTA Pt verbalized understanding and denied any further questions or concerns at this time. ______

## 2014-11-12 NOTE — Telephone Encounter (Signed)
Called and spoke to pt. Informed pt of the results and recs per [facility]. Order placed for CTA Pt verbalized understanding and denied any further questions or concerns at this time.     Notes Recorded by Kathee Delton, MD on 11/11/2014 at 5:41 PM Let pt know that her cxr is unchanged. Nothing to explain her worsening sob and chest tightness.  Will need to do scan of her chest as we discussed to rule out blood clots. Please put in order for CT angio: Evaluate for PE. She has had recent bmet.  I will call her with results.

## 2014-11-13 ENCOUNTER — Ambulatory Visit (INDEPENDENT_AMBULATORY_CARE_PROVIDER_SITE_OTHER)
Admission: RE | Admit: 2014-11-13 | Discharge: 2014-11-13 | Disposition: A | Discharge status: Home / Self Care | Payer: Medicare Other | Source: Ambulatory Visit | Attending: Pulmonary Disease | Admitting: Pulmonary Disease

## 2014-11-13 DIAGNOSIS — R0602 Shortness of breath: Secondary | ICD-10-CM

## 2014-11-13 DIAGNOSIS — R0789 Other chest pain: Secondary | ICD-10-CM

## 2014-11-13 MED ORDER — IOHEXOL 350 MG/ML SOLN
80.0000 mL | Freq: Once | INTRAVENOUS | Status: AC | PRN
  Administered 2014-11-13: 80 mL via INTRAVENOUS

## 2014-11-19 ENCOUNTER — Other Ambulatory Visit: Payer: Self-pay | Admitting: Radiology

## 2014-11-30 ENCOUNTER — Inpatient Hospital Stay (HOSPITAL_COMMUNITY)
Admission: EM | Admit: 2014-11-30 | Discharge: 2014-12-04 | DRG: 190 | Disposition: A | Discharge status: Home / Self Care | Payer: Medicare Other | Attending: Internal Medicine | Admitting: Internal Medicine

## 2014-11-30 ENCOUNTER — Emergency Department (HOSPITAL_COMMUNITY): Payer: Medicare Other

## 2014-11-30 ENCOUNTER — Encounter (HOSPITAL_COMMUNITY): Payer: Self-pay | Admitting: Emergency Medicine

## 2014-11-30 DIAGNOSIS — I10 Essential (primary) hypertension: Secondary | ICD-10-CM | POA: Diagnosis present

## 2014-11-30 DIAGNOSIS — T380X5A Adverse effect of glucocorticoids and synthetic analogues, initial encounter: Secondary | ICD-10-CM | POA: Diagnosis present

## 2014-11-30 DIAGNOSIS — R739 Hyperglycemia, unspecified: Secondary | ICD-10-CM | POA: Diagnosis present

## 2014-11-30 DIAGNOSIS — D638 Anemia in other chronic diseases classified elsewhere: Secondary | ICD-10-CM | POA: Diagnosis present

## 2014-11-30 DIAGNOSIS — D72829 Elevated white blood cell count, unspecified: Secondary | ICD-10-CM | POA: Diagnosis present

## 2014-11-30 DIAGNOSIS — J441 Chronic obstructive pulmonary disease with (acute) exacerbation: Secondary | ICD-10-CM | POA: Diagnosis not present

## 2014-11-30 DIAGNOSIS — M810 Age-related osteoporosis without current pathological fracture: Secondary | ICD-10-CM | POA: Diagnosis present

## 2014-11-30 DIAGNOSIS — R6889 Other general symptoms and signs: Secondary | ICD-10-CM | POA: Diagnosis present

## 2014-11-30 DIAGNOSIS — R0602 Shortness of breath: Secondary | ICD-10-CM | POA: Diagnosis not present

## 2014-11-30 DIAGNOSIS — J449 Chronic obstructive pulmonary disease, unspecified: Secondary | ICD-10-CM | POA: Diagnosis present

## 2014-11-30 DIAGNOSIS — Z9981 Dependence on supplemental oxygen: Secondary | ICD-10-CM | POA: Diagnosis not present

## 2014-11-30 DIAGNOSIS — Z7982 Long term (current) use of aspirin: Secondary | ICD-10-CM

## 2014-11-30 DIAGNOSIS — Z87891 Personal history of nicotine dependence: Secondary | ICD-10-CM

## 2014-11-30 DIAGNOSIS — J9620 Acute and chronic respiratory failure, unspecified whether with hypoxia or hypercapnia: Secondary | ICD-10-CM | POA: Diagnosis present

## 2014-11-30 DIAGNOSIS — Z8543 Personal history of malignant neoplasm of ovary: Secondary | ICD-10-CM

## 2014-11-30 DIAGNOSIS — J9621 Acute and chronic respiratory failure with hypoxia: Secondary | ICD-10-CM | POA: Diagnosis present

## 2014-11-30 DIAGNOSIS — Z7952 Long term (current) use of systemic steroids: Secondary | ICD-10-CM | POA: Diagnosis not present

## 2014-11-30 DIAGNOSIS — E785 Hyperlipidemia, unspecified: Secondary | ICD-10-CM | POA: Diagnosis present

## 2014-11-30 DIAGNOSIS — J438 Other emphysema: Secondary | ICD-10-CM

## 2014-11-30 HISTORY — DX: Unspecified intestinal obstruction, unspecified as to partial versus complete obstruction: K56.609

## 2014-11-30 HISTORY — DX: Chronic respiratory failure, unspecified whether with hypoxia or hypercapnia: J96.10

## 2014-11-30 HISTORY — DX: Emphysema, unspecified: J43.9

## 2014-11-30 LAB — BLOOD GAS, ARTERIAL
Acid-Base Excess: 5.6 mmol/L — ABNORMAL HIGH (ref 0.0–2.0)
BICARBONATE: 32.4 meq/L — AB (ref 20.0–24.0)
DRAWN BY: 295031
O2 Content: 2 L/min
O2 Saturation: 93.3 %
PO2 ART: 68.3 mmHg — AB (ref 80.0–100.0)
Patient temperature: 98.6
TCO2: 30 mmol/L (ref 0–100)
pCO2 arterial: 61.9 mmHg (ref 35.0–45.0)
pH, Arterial: 7.339 — ABNORMAL LOW (ref 7.350–7.450)

## 2014-11-30 LAB — BASIC METABOLIC PANEL
Anion gap: 8 (ref 5–15)
BUN: 12 mg/dL (ref 6–23)
CO2: 35 mmol/L — AB (ref 19–32)
CREATININE: 0.6 mg/dL (ref 0.50–1.10)
Calcium: 9.7 mg/dL (ref 8.4–10.5)
Chloride: 92 mmol/L — ABNORMAL LOW (ref 96–112)
GFR calc non Af Amer: 88 mL/min — ABNORMAL LOW (ref >90)
Glucose, Bld: 157 mg/dL — ABNORMAL HIGH (ref 70–99)
POTASSIUM: 4.3 mmol/L (ref 3.5–5.1)
SODIUM: 135 mmol/L (ref 135–145)

## 2014-11-30 LAB — CBC
HCT: 36.9 % (ref 36.0–46.0)
Hemoglobin: 11.7 g/dL — ABNORMAL LOW (ref 12.0–15.0)
MCH: 32.7 pg (ref 26.0–34.0)
MCHC: 31.7 g/dL (ref 30.0–36.0)
MCV: 103.1 fL — ABNORMAL HIGH (ref 78.0–100.0)
Platelets: 240 10*3/uL (ref 150–400)
RBC: 3.58 MIL/uL — ABNORMAL LOW (ref 3.87–5.11)
RDW: 13 % (ref 11.5–15.5)
WBC: 13.5 10*3/uL — ABNORMAL HIGH (ref 4.0–10.5)

## 2014-11-30 LAB — BRAIN NATRIURETIC PEPTIDE: B Natriuretic Peptide: 33.5 pg/mL (ref 0.0–100.0)

## 2014-11-30 LAB — TROPONIN I: Troponin I: <0.03 ng/mL (ref <0.031)

## 2014-11-30 LAB — I-STAT TROPONIN, ED: TROPONIN I, POC: 0 ng/mL (ref 0.00–0.08)

## 2014-11-30 MED ORDER — CALCIUM CARBONATE 1500 (600 CA) MG PO TABS: 1.0000 | ORAL_TABLET | Freq: Two times a day (BID) | ORAL | Status: DC

## 2014-11-30 MED ORDER — IPRATROPIUM-ALBUTEROL 0.5-2.5 (3) MG/3ML IN SOLN
RESPIRATORY_TRACT | Status: AC
  Administered 2014-11-30: 3 mL via RESPIRATORY_TRACT
  Filled 2014-11-30: qty 3

## 2014-11-30 MED ORDER — CALCIUM CARBONATE 1250 (500 CA) MG PO TABS
500.0000 mg | ORAL_TABLET | Freq: Two times a day (BID) | ORAL | Status: DC
  Administered 2014-12-01 – 2014-12-02 (×4): 500 mg via ORAL
  Filled 2014-11-30 (×10): qty 1

## 2014-11-30 MED ORDER — ASPIRIN EC 81 MG PO TBEC
81.0000 mg | DELAYED_RELEASE_TABLET | Freq: Every day | ORAL | Status: DC
  Administered 2014-11-30 – 2014-12-04 (×5): 81 mg via ORAL
  Filled 2014-11-30 (×5): qty 1

## 2014-11-30 MED ORDER — SODIUM CHLORIDE 0.9 % IV SOLN
INTRAVENOUS | Status: AC
  Administered 2014-11-30: 13:00:00 via INTRAVENOUS

## 2014-11-30 MED ORDER — ALPRAZOLAM 0.25 MG PO TABS
0.2500 mg | ORAL_TABLET | Freq: Every evening | ORAL | Status: DC | PRN
  Administered 2014-11-30 – 2014-12-03 (×4): 0.25 mg via ORAL
  Filled 2014-11-30 (×4): qty 1

## 2014-11-30 MED ORDER — IPRATROPIUM-ALBUTEROL 0.5-2.5 (3) MG/3ML IN SOLN
3.0000 mL | RESPIRATORY_TRACT | Status: DC
  Administered 2014-11-30 – 2014-12-04 (×23): 3 mL via RESPIRATORY_TRACT
  Filled 2014-11-30 (×24): qty 3

## 2014-11-30 MED ORDER — METHYLPREDNISOLONE SODIUM SUCC 125 MG IJ SOLR
60.0000 mg | Freq: Two times a day (BID) | INTRAMUSCULAR | Status: DC
  Administered 2014-11-30: 60 mg via INTRAVENOUS
  Filled 2014-11-30 (×3): qty 0.96

## 2014-11-30 MED ORDER — IRBESARTAN 300 MG PO TABS
300.0000 mg | ORAL_TABLET | Freq: Every day | ORAL | Status: DC
  Administered 2014-12-01 – 2014-12-04 (×4): 300 mg via ORAL
  Filled 2014-11-30 (×5): qty 1

## 2014-11-30 MED ORDER — FLUTICASONE PROPIONATE 50 MCG/ACT NA SUSP
1.0000 | Freq: Every day | NASAL | Status: DC
  Filled 2014-11-30: qty 16

## 2014-11-30 MED ORDER — METHYLPREDNISOLONE SODIUM SUCC 125 MG IJ SOLR
125.0000 mg | Freq: Once | INTRAMUSCULAR | Status: AC
  Administered 2014-11-30: 125 mg via INTRAVENOUS
  Filled 2014-11-30: qty 2

## 2014-11-30 MED ORDER — ASPIRIN 81 MG PO TABS: 81.0000 mg | ORAL_TABLET | Freq: Every day | ORAL | Status: DC

## 2014-11-30 MED ORDER — ONDANSETRON HCL 4 MG PO TABS: 4.0000 mg | ORAL_TABLET | Freq: Four times a day (QID) | ORAL | Status: DC | PRN

## 2014-11-30 MED ORDER — IPRATROPIUM-ALBUTEROL 0.5-2.5 (3) MG/3ML IN SOLN
3.0000 mL | RESPIRATORY_TRACT | Status: DC | PRN
  Filled 2014-11-30: qty 3

## 2014-11-30 MED ORDER — IPRATROPIUM-ALBUTEROL 0.5-2.5 (3) MG/3ML IN SOLN
3.0000 mL | Freq: Once | RESPIRATORY_TRACT | Status: AC
  Administered 2014-11-30: 3 mL via RESPIRATORY_TRACT
  Filled 2014-11-30: qty 3

## 2014-11-30 MED ORDER — POLYETHYLENE GLYCOL 3350 17 G PO PACK
17.0000 g | PACK | Freq: Every day | ORAL | Status: DC | PRN
  Administered 2014-12-01: 17 g via ORAL
  Filled 2014-11-30: qty 1

## 2014-11-30 MED ORDER — ONDANSETRON HCL 4 MG/2ML IJ SOLN: 4.0000 mg | Freq: Four times a day (QID) | INTRAMUSCULAR | Status: DC | PRN

## 2014-11-30 MED ORDER — ATORVASTATIN CALCIUM 80 MG PO TABS
80.0000 mg | ORAL_TABLET | Freq: Every day | ORAL | Status: DC
  Administered 2014-11-30 – 2014-12-03 (×4): 80 mg via ORAL
  Filled 2014-11-30 (×5): qty 1

## 2014-11-30 MED ORDER — BENZONATATE 100 MG PO CAPS
100.0000 mg | ORAL_CAPSULE | Freq: Three times a day (TID) | ORAL | Status: DC | PRN
  Administered 2014-12-01 – 2014-12-02 (×2): 100 mg via ORAL
  Filled 2014-11-30 (×3): qty 1

## 2014-11-30 MED ORDER — AMOXICILLIN 250 MG/5ML PO SUSR
125.0000 mg | Freq: Three times a day (TID) | ORAL | Status: DC
  Administered 2014-11-30 – 2014-12-04 (×12): 125 mg via ORAL
  Filled 2014-11-30 (×22): qty 5

## 2014-11-30 NOTE — ED Notes (Signed)
Called to give report to floor, RN unavailable at this time. Awaiting a call back.

## 2014-11-30 NOTE — ED Notes (Signed)
Per EMS pt comes from home for [facility] and exertional dyspnea that has increased over the past couple of days.  Pt was given 2 albuterol neb treatments and Solu-medrol 125mg  IVP in route.  Pt has COPD and on continuous  O2 2L at home

## 2014-11-30 NOTE — H&P (Signed)
[facility] History and Physical  [name] HDQ:[id number] DOB: Oct 29, 1941 DOA: 11/30/2014  Referring physician: ER physician PCP: Gennette Pac, MD   Chief Complaint: shortness of breath   HPI:  74 year old female with past medical history of COPD on home oxygen, dyslipidemia, hypertension, osteoporosis who presented to [facility] ED with progressively worsening shortness of breath over past few weeks prior to this admission but getting even worse over last day or so. Patient reported shortness of breath worse with exertion but present at rest as well. There is no associated fevers or chills. There is no associated chest pain or palpitations. She reports coughing but no productive cough. Patient was seen in the office not too long ago and put on his prednisone taper by her symptoms have gotten worse as mentioned in past 24 hours. No complaints of abdominal pain, nausea or vomiting. No lightheadedness or dizziness or loss of consciousness. No diarrhea or constipation. No urinary complaints.  In ED, blood pressure is 146/58, heart rate 116, respiratory rate 24, oxygen saturation 95% with nasal cannula oxygen support. Blood work revealed white blood cell count of 13.5, hemoglobin 11.7 and the rest of the blood work unremarkable. Chest x-ray showed emphysema without acute disease. Patient was given nebulizer treatment in ED along with Solu-Medrol 125 mg IV but she continues to have wheezing and shortness of breath and requires further admission for treatment of COPD exacerbation.   Assessment & Plan    Principal problem: Acute respiratory failure with hypoxia / acute COPD exacerbation / leukocytosis - Patient has history of COPD and requires oxygen at home - Patient was started on nebulizer treatment on this admission, duoneb every 4 hours scheduled and every 2 hours as needed. Home inhalers are on hold for now - Start Solu-Medrol 60 mg IV every 12 hours - No fevers and no reports of  productive cough. No evidence of pneumonia on chest x-ray so we will defer antibiotic treatment for now. Elevated white blood cell count is likely because of steroids. - Admission to medical floor  Active problems: Essential hypertension - continue benicar 40 mg daily  Dyslipidemia - Continue statin therapy    DVT prophylaxis:  - SCD's bilaterally   Radiological Exams on Admission: Dg Chest 2 View (if Patient Has Fever And/or Copd) 11/30/2014   Emphysema without acute disease.     EKG: sinus sinus tachycardia   Code Status: Full Family Communication: Plan of care discussed with the patient  Disposition Plan: Admit for further evaluation  Leisa Lenz, MD  [facility] Pager [phone]  Review of Systems:  Constitutional: Negative for fever, chills and malaise/fatigue. Negative for diaphoresis.  HENT: Negative for hearing loss, ear pain, nosebleeds, congestion, sore throat, neck pain, tinnitus and ear discharge.   Eyes: Negative for blurred vision, double vision, photophobia, pain, discharge and redness.  Respiratory: per HPI   Cardiovascular: Negative for chest pain, palpitations, orthopnea, claudication and leg swelling.  Gastrointestinal: Negative for nausea, vomiting and abdominal pain. Negative for heartburn, constipation, blood in stool and melena.  Genitourinary: Negative for dysuria, urgency, frequency, hematuria and flank pain.  Musculoskeletal: Negative for myalgias, back pain, joint pain and falls.  Skin: Negative for itching and rash.  Neurological: Negative for dizziness and weakness. Negative for tingling, tremors, sensory change, speech change, focal weakness, loss of consciousness and headaches.  Endo/Heme/Allergies: Negative for environmental allergies and polydipsia. Does not bruise/bleed easily.  Psychiatric/Behavioral: Negative for suicidal ideas. The patient is not nervous/anxious.      Past  Medical History  Diagnosis Date  . Emphysema   . Ovarian  cancer on left   . Hyperlipidemia   . Hypertension   . Blood transfusion without reported diagnosis   . Emphysema of lung   . Osteoporosis    Past Surgical History  Procedure Laterality Date  . Abdominal hysterectomy    . Laparoscopy  11/07/2012    Procedure: LAPAROSCOPY DIAGNOSTIC;  Surgeon: Ralene Ok, MD;  Location: [facility];  Service: General;  Laterality: N/A;  . Laparotomy  11/07/2012    Procedure: EXPLORATORY LAPAROTOMY;  Surgeon: Ralene Ok, MD;  Location: [facility];  Service: General;  Laterality: N/A;  . Laparoscopic lysis of adhesions  11/07/2012    Procedure: LAPAROSCOPIC LYSIS OF ADHESIONS;  Surgeon: Ralene Ok, MD;  Location: [facility];  Service: General;  Laterality: N/A;  . Lysis of adhesion  11/07/2012    Procedure: LYSIS OF ADHESION;  Surgeon: Ralene Ok, MD;  Location: [facility];  Service: General;;   Social History:  reports that she quit smoking about 22 years ago. Her smoking use included Cigarettes. She has a 30 pack-year smoking history. She has never used smokeless tobacco. She reports that she does not use illicit drugs. Her alcohol history is not on file.  Allergies  Allergen Reactions  . Oxycodone-Acetaminophen     REACTION: headaches,nausea    Family History:  Family History  Problem Relation Age of Onset  . Heart failure Mother   . Heart attack Father   . Heart attack Paternal Grandmother      Prior to Admission medications   Medication Sig Start Date End Date Taking? Authorizing Provider  albuterol (PROAIR HFA) 108 (90 BASE) MCG/ACT inhaler Inhale 2 puffs into the lungs every 6 (six) hours as needed. 07/15/14   Kathee Delton, MD  albuterol (PROVENTIL) (2.5 MG/3ML) 0.083% nebulizer solution Take 3 mLs (2.5 mg total) by nebulization every 6 (six) hours as needed. Dx: 492.8 07/15/14 07/15/15  Kathee Delton, MD  ALPRAZolam Duanne Moron) 0.25 MG tablet Take 1 tablet (0.25 mg total) by mouth at bedtime as needed. 11/29/12   Erick Colace, NP  aspirin 81 MG  tablet Take 81 mg by mouth daily.      Historical Provider, MD  atorvastatin (LIPITOR) 80 MG tablet Take 80 mg by mouth daily.      Historical Provider, MD  BENICAR 40 MG tablet Take 1 tablet by mouth daily.    Historical Provider, MD  benzonatate (TESSALON) 100 MG capsule Take 1 capsule (100 mg total) by mouth 3 (three) times daily as needed for cough. 07/15/14   Kathee Delton, MD  Calcium Carbonate (CALTRATE 600) 1500 MG TABS Take 1 tablet by mouth 2 (two) times daily.      Historical Provider, MD  Lutein 20 MG CAPS Take 1 capsule by mouth daily.      Historical Provider, MD  Multiple Vitamin (ANTIOXIDANT PO) Take 1 tablet by mouth daily.      Historical Provider, MD  Omega-3 Fatty Acids (FISH OIL) 1000 MG CAPS Take 1 capsule by mouth 2 (two) times daily.      Historical Provider, MD  Polyethyl Glycol-Propyl Glycol (SYSTANE) 0.4-0.3 % SOLN Apply 1 drop to eye daily as needed. dryness    Historical Provider, MD  polyethylene glycol (MIRALAX / GLYCOLAX) packet Take 17 g by mouth daily as needed for mild constipation. 10/24/13   Kelvin Cellar, MD  predniSONE (DELTASONE) 10 MG tablet Take 4 tabs daily x 2 days,  3 tabs daily x 2 days, 2 tabs daily x 2 days, 1 tab daily x 2 days 07/15/14   Kathee Delton, MD  risedronate (ACTONEL) 150 MG tablet Take 150 mg by mouth every 30 (thirty) days. with water on empty stomach, nothing by mouth or lie down for next 30 minutes.     Historical Provider, MD  SYMBICORT 160-4.5 MCG/ACT inhaler INHALE 2 PUFFS INTO THE LUNGS 2 (TWO) TIMES DAILY. 09/09/14   Kathee Delton, MD  tiotropium (SPIRIVA) 18 MCG inhalation capsule Place 1 capsule (18 mcg total) into inhaler and inhale daily. 01/12/14   Kathee Delton, MD   Physical Exam: Filed Vitals:   11/30/14 3267 11/30/14 0847 11/30/14 0930  BP: 146/58    Pulse: 116    Temp:  98.5 F (36.9 C)   TempSrc:  Oral   Resp:  24   SpO2: 100%  95%    Physical Exam  Constitutional: Appears well-developed and well-nourished. No  distress.  HENT: Normocephalic. No tonsillar erythema or exudates Eyes: Conjunctivae and EOM are normal. PERRLA, no scleral icterus.  Neck: Normal ROM. Neck supple. No JVD. No tracheal deviation. No thyromegaly.  CVS: RRR, S1/S2 +, no murmurs, no gallops, no carotid bruit.  Pulmonary: wheezing in mid lung loves, no crackles   Abdominal: Soft. BS +,  no distension, tenderness, rebound or guarding.  Musculoskeletal: Normal range of motion. No edema and no tenderness.  Lymphadenopathy: No lymphadenopathy noted, cervical, inguinal. Neuro: Alert. Normal reflexes, muscle tone coordination. No focal neurologic deficits. Skin: Skin is warm and dry. No rash noted.  No erythema. No pallor.  Psychiatric: Normal mood and affect. Behavior, judgment, thought content normal.   Labs on Admission:  Basic Metabolic Panel:  Recent Labs Lab 11/30/14 0852  NA 135  K 4.3  CL 92*  CO2 35*  GLUCOSE 157*  BUN 12  CREATININE 0.60  CALCIUM 9.7   Liver Function Tests: No results for input(s): AST, ALT, ALKPHOS, BILITOT, PROT, ALBUMIN in the last 168 hours. No results for input(s): LIPASE, AMYLASE in the last 168 hours. No results for input(s): AMMONIA in the last 168 hours. CBC:  Recent Labs Lab 11/30/14 0852  WBC 13.5*  HGB 11.7*  HCT 36.9  MCV 103.1*  PLT 240   Cardiac Enzymes:  Recent Labs Lab 11/30/14 0852  TROPONINI <0.03   BNP: Invalid input(s): POCBNP CBG: No results for input(s): GLUCAP in the last 168 hours.  If 7PM-7AM, please contact night-coverage [email or web address] Password [facility] 11/30/2014, 10:33 AM

## 2014-11-30 NOTE — ED Notes (Signed)
Patient requesting medication for her sinus pressure. Made [facility] EDP aware. No new orders at this time.

## 2014-11-30 NOTE — ED Provider Notes (Signed)
CSN: [id number]     Arrival date & time 11/30/14  4235 History   First MD Initiated Contact with Patient 11/30/14 [phone]     Chief Complaint  Patient presents with  . Shortness of Breath    Patient is a 74 y.o. female presenting with shortness of breath. The history is provided by the patient.  Shortness of Breath Severity:  Moderate Onset quality:  Gradual Duration:  3 weeks Timing:  Constant Progression:  Worsening Chronicity:  Chronic Context: activity   Relieved by:  Nothing Worsened by:  Activity and exertion Ineffective treatments:  Inhaler Associated symptoms: no cough, no ear pain, no fever, no sputum production, no vomiting and no wheezing   Associated symptoms comment:  Sinus congestion   patient has a history of severe COPD. She quit smoking back in the 90s.  She sees Dr. Normajean Baxter for her chronic COPD. She is on oxygen at home. The last several weeks she has had some worsening issues with shortness of breath. She saw Dr. Marene Lenz and was put on a steroid taper. She also had a CT scan of the chest to evaluate for pulmonary embolism. This was negative. Over the last several days the symptoms have become even worse. The patient is getting short of breath now even with walking short distances in her house. Right her inhalers and continues to wear oxygen but does not feel any better. She denies any trouble with chest pain. No fevers. No swelling.  Past Medical History  Diagnosis Date  . Emphysema   . 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 Hyperlipidemia   . Hypertension   . Blood transfusion without reported diagnosis   . Emphysema of lung   . Osteoporosis    Past Surgical History  Procedure Laterality Date  . Abdominal hysterectomy    . Laparoscopy  11/07/2012    Procedure: LAPAROSCOPY DIAGNOSTIC;  Surgeon: Ralene Ok, MD;  Location: [facility];  Service: General;  Laterality: N/A;  . Laparotomy  11/07/2012    Procedure: EXPLORATORY LAPAROTOMY;  Surgeon: Ralene Ok, MD;  Location: [facility]  [facility];  Service: General;  Laterality: N/A;  . Laparoscopic lysis of adhesions  11/07/2012    Procedure: LAPAROSCOPIC LYSIS OF ADHESIONS;  Surgeon: Ralene Ok, MD;  Location: [facility];  Service: General;  Laterality: N/A;  . Lysis of adhesion  11/07/2012    Procedure: LYSIS OF ADHESION;  Surgeon: Ralene Ok, MD;  Location: [facility];  Service: General;;   Family History  Problem Relation Age of Onset  . Heart failure Mother   . Heart attack Father   . Heart attack Paternal Grandmother    History  Substance Use Topics  . Smoking status: Former Smoker -- 1.00 packs/day for 30 years    Types: Cigarettes    Quit date: 11/06/1992  . Smokeless tobacco: Never Used  . Alcohol Use: Not on file   OB History    No data available     Review of Systems  Constitutional: Negative for fever.  HENT: Negative for ear pain.   Respiratory: Positive for shortness of breath. Negative for cough, sputum production and wheezing.   Gastrointestinal: Negative for vomiting.  All other systems reviewed and are negative.     Allergies  Oxycodone-acetaminophen  Home Medications   Prior to Admission medications   Medication Sig Start Date End Date Taking? Authorizing Provider  albuterol (PROAIR HFA) 108 (90 BASE) MCG/ACT inhaler Inhale 2 puffs into the lungs every 6 (six) hours as needed. 07/15/14  Kathee Delton, MD  albuterol (PROVENTIL) (2.5 MG/3ML) 0.083% nebulizer solution Take 3 mLs (2.5 mg total) by nebulization every 6 (six) hours as needed. Dx: 492.8 07/15/14 07/15/15  Kathee Delton, MD  ALPRAZolam Duanne Moron) 0.25 MG tablet Take 1 tablet (0.25 mg total) by mouth at bedtime as needed. 11/29/12   Erick Colace, NP  aspirin 81 MG tablet Take 81 mg by mouth daily.      Historical Provider, MD  atorvastatin (LIPITOR) 80 MG tablet Take 80 mg by mouth daily.      Historical Provider, MD  BENICAR 40 MG tablet Take 1 tablet by mouth daily.    Historical Provider, MD  benzonatate (TESSALON) 100 MG capsule  Take 1 capsule (100 mg total) by mouth 3 (three) times daily as needed for cough. 07/15/14   Kathee Delton, MD  Calcium Carbonate (CALTRATE 600) 1500 MG TABS Take 1 tablet by mouth 2 (two) times daily.      Historical Provider, MD  Lutein 20 MG CAPS Take 1 capsule by mouth daily.      Historical Provider, MD  Multiple Vitamin (ANTIOXIDANT PO) Take 1 tablet by mouth daily.      Historical Provider, MD  Omega-3 Fatty Acids (FISH OIL) 1000 MG CAPS Take 1 capsule by mouth 2 (two) times daily.      Historical Provider, MD  Polyethyl Glycol-Propyl Glycol (SYSTANE) 0.4-0.3 % SOLN Apply 1 drop to eye daily as needed. dryness    Historical Provider, MD  polyethylene glycol (MIRALAX / GLYCOLAX) packet Take 17 g by mouth daily as needed for mild constipation. 10/24/13   Kelvin Cellar, MD  predniSONE (DELTASONE) 10 MG tablet Take 4 tabs daily x 2 days, 3 tabs daily x 2 days, 2 tabs daily x 2 days, 1 tab daily x 2 days 07/15/14   Kathee Delton, MD  risedronate (ACTONEL) 150 MG tablet Take 150 mg by mouth every 30 (thirty) days. with water on empty stomach, nothing by mouth or lie down for next 30 minutes.     Historical Provider, MD  SYMBICORT 160-4.5 MCG/ACT inhaler INHALE 2 PUFFS INTO THE LUNGS 2 (TWO) TIMES DAILY. 09/09/14   Kathee Delton, MD  tiotropium (SPIRIVA) 18 MCG inhalation capsule Place 1 capsule (18 mcg total) into inhaler and inhale daily. 01/12/14   Kathee Delton, MD   BP 146/58 mmHg  Pulse 116  Temp(Src) 98.5 F (36.9 C) (Oral)  Resp 24  SpO2 95% Physical Exam  Constitutional: No distress.  HENT:  Head: Normocephalic and atraumatic.  Right Ear: External ear normal.  Left Ear: External ear normal.  Mouth/Throat: No oropharyngeal exudate.  No sinus ttp, no nasal drainage noted   Eyes: Conjunctivae are normal. Right eye exhibits no discharge. Left eye exhibits no discharge. No scleral icterus.  Neck: Neck supple. No tracheal deviation present.  Cardiovascular: Normal rate, regular rhythm  and intact distal pulses.   Pulmonary/Chest: Accessory muscle usage present. No stridor. No respiratory distress. She has decreased breath sounds. She has wheezes (faint wheeze at end expiration). She has no rales.  Able to speak in full sentences  Abdominal: Soft. Bowel sounds are normal. She exhibits no distension. There is no tenderness. There is no rebound and no guarding.  Musculoskeletal: She exhibits no edema or tenderness.  Neurological: She is alert. She has normal strength. No cranial nerve deficit (no facial droop, extraocular movements intact, no slurred speech) or sensory deficit. She exhibits normal muscle tone. She displays no  seizure activity. Coordination normal.  Skin: Skin is warm and dry. No rash noted. She is not diaphoretic.  Psychiatric: She has a normal mood and affect.  Nursing note and vitals reviewed.   ED Course  Procedures (including critical care time) Labs Review Labs Reviewed  BASIC METABOLIC PANEL - Abnormal; Notable for the following:    Chloride 92 (*)    CO2 35 (*)    Glucose, Bld 157 (*)    GFR calc non Af Amer 88 (*)    All other components within normal limits  CBC - Abnormal; Notable for the following:    WBC 13.5 (*)    RBC 3.58 (*)    Hemoglobin 11.7 (*)    MCV 103.1 (*)    All other components within normal limits  BLOOD GAS, ARTERIAL - Abnormal; Notable for the following:    pH, Arterial 7.339 (*)    pCO2 arterial 61.9 (*)    pO2, Arterial 68.3 (*)    Bicarbonate 32.4 (*)    Acid-Base Excess 5.6 (*)    All other components within normal limits  TROPONIN I  BRAIN NATRIURETIC PEPTIDE  I-STAT TROPOININ, ED    Imaging Review Dg Chest 2 View (if Patient Has Fever And/or Copd)  11/30/2014   CLINICAL DATA:  Worsened shortness of breath beginning this morning.  EXAM: CHEST  2 VIEW  COMPARISON:  PA and lateral chest 11/11/2014.  CT chest 11/13/2014.  FINDINGS: The lungs are emphysematous but clear. Heart size is normal. There is no  pneumothorax or pleural effusion.  IMPRESSION: Emphysema without acute disease.   Electronically Signed   By: Inge Rise M.D.   On: 11/30/2014 10:11     EKG Interpretation   Date/Time:  Monday November 30 2014 08:44:55 EST Ventricular Rate:  117 PR Interval:  135 QRS Duration: 79 QT Interval:  289 QTC Calculation: 403 R Axis:   73 Text Interpretation:  Sinus tachycardia Low voltage, extremity and  precordial leads Poor data quality otherwise doubt significant change  Confirmed by Matej Sappenfield  MD-J, Karon Heckendorn ([id number]) on 11/30/2014 9:19:58 AM     Imaging from Jan 8th 2016 CT angio chest IMPRESSION: 1. No evidence of acute pulmonary embolus. Enlarged central pulmonary arteries suggesting pulmonary artery hypertension. 2. Severe emphysema. Distal right lower lobe tree-in-bud opacity, but much of this appears to be calcified an this may be sequelae of chronic distal airway infection. 3. Small 10 mm right breast soft tissue nodule, mammogram correlation recommended if not recently done.  Medications  methylPREDNISolone sodium succinate (SOLU-MEDROL) 125 mg/2 mL injection 125 mg (not administered)  ipratropium-albuterol (DUONEB) 0.5-2.5 (3) MG/3ML nebulizer solution 3 mL (3 mLs Nebulization Given 11/30/14 0929)    MDM   Final diagnoses:  COPD with acute exacerbation    Pt presents with worsening shortness of breath.  CT scan this month without COPD.  Suspect worsening COPD exacerbation.  Breathing is not labored here at rest but sx are more severe with exertion. ABG shows primarily chronic hypercarbia and hypoxia although may be acutely worse.  Pt does not feel well enough to go home. Ordered steroids and breathing treatments.  Will consult with the hospitalist regarding admission.    Dorie Rank, MD 11/30/14 1028

## 2014-11-30 NOTE — ED Notes (Signed)
Patient returned from X-ray 

## 2014-11-30 NOTE — ED Notes (Signed)
Bed: WA13 Expected date:  Expected time:  Means of arrival:  Comments: EMS-SOB 

## 2014-12-01 LAB — CBC
HCT: 33.1 % — ABNORMAL LOW (ref 36.0–46.0)
Hemoglobin: 10.6 g/dL — ABNORMAL LOW (ref 12.0–15.0)
MCH: 32.3 pg (ref 26.0–34.0)
MCHC: 32 g/dL (ref 30.0–36.0)
MCV: 100.9 fL — ABNORMAL HIGH (ref 78.0–100.0)
Platelets: 231 10*3/uL (ref 150–400)
RBC: 3.28 MIL/uL — ABNORMAL LOW (ref 3.87–5.11)
RDW: 13 % (ref 11.5–15.5)
WBC: 6.9 10*3/uL (ref 4.0–10.5)

## 2014-12-01 LAB — COMPREHENSIVE METABOLIC PANEL
ALBUMIN: 3.2 g/dL — AB (ref 3.5–5.2)
ALK PHOS: 86 U/L (ref 39–117)
ALT: 17 U/L (ref 0–35)
AST: 16 U/L (ref 0–37)
Anion gap: 8 (ref 5–15)
BILIRUBIN TOTAL: 0.5 mg/dL (ref 0.3–1.2)
BUN: 16 mg/dL (ref 6–23)
CO2: 33 mmol/L — AB (ref 19–32)
CREATININE: 0.53 mg/dL (ref 0.50–1.10)
Calcium: 9.6 mg/dL (ref 8.4–10.5)
Chloride: 95 mmol/L — ABNORMAL LOW (ref 96–112)
GFR calc Af Amer: >90 mL/min (ref >90)
GFR calc non Af Amer: >90 mL/min (ref >90)
Glucose, Bld: 150 mg/dL — ABNORMAL HIGH (ref 70–99)
POTASSIUM: 4.2 mmol/L (ref 3.5–5.1)
Sodium: 136 mmol/L (ref 135–145)
TOTAL PROTEIN: 5.6 g/dL — AB (ref 6.0–8.3)

## 2014-12-01 LAB — GLUCOSE, CAPILLARY: GLUCOSE-CAPILLARY: 132 mg/dL — AB (ref 70–99)

## 2014-12-01 MED ORDER — METHYLPREDNISOLONE SODIUM SUCC 125 MG IJ SOLR
60.0000 mg | INTRAMUSCULAR | Status: DC
  Administered 2014-12-01: 60 mg via INTRAVENOUS
  Filled 2014-12-01 (×2): qty 0.96

## 2014-12-01 MED ORDER — POLYVINYL ALCOHOL 1.4 % OP SOLN
1.0000 [drp] | Freq: Every day | OPHTHALMIC | Status: DC | PRN
  Administered 2014-12-01: 1 [drp] via OPHTHALMIC
  Filled 2014-12-01: qty 15

## 2014-12-01 MED ORDER — ACETAMINOPHEN 325 MG PO TABS
650.0000 mg | ORAL_TABLET | Freq: Four times a day (QID) | ORAL | Status: DC | PRN
  Administered 2014-12-01 – 2014-12-04 (×5): 650 mg via ORAL
  Filled 2014-12-01 (×5): qty 2

## 2014-12-01 MED ORDER — POLYETHYL GLYCOL-PROPYL GLYCOL 0.4-0.3 % OP SOLN: 1.0000 [drp] | Freq: Every day | OPHTHALMIC | Status: DC | PRN

## 2014-12-01 NOTE — Care Management Note (Signed)
CARE MANAGEMENT NOTE 12/01/2014  [name], [name]   Account Number:  [id number]  Date Initiated:  12/01/2014  Documentation initiated by:  Marney Doctor  Subjective/Objective Assessment:   74 yo admitted with COPD     Action/Plan:   From home alone   Anticipated DC Date:  12/04/2014   Anticipated DC Plan:  [place]  CM consult      Choice offered to / List presented to:             Status of service:  In process, will continue to follow Medicare Important Message given?   (If response is "NO", the following Medicare IM given date fields will be blank) Date Medicare IM given:   Medicare IM given by:   Date Additional Medicare IM given:   Additional Medicare IM given by:    Discharge Disposition:    Per UR Regulation:  Reviewed for med. necessity/level of care/duration of stay  If discussed at [place] of Stay Meetings, dates discussed:    Comments:  12/01/14 Marney Doctor RN,BSN,NCM [phone] [facility] orders written.  Will await PT eval and assist with DC planning as needed.

## 2014-12-01 NOTE — Progress Notes (Signed)
Patient ID: [name], female   DOB: Jul 08, 1941, 74 y.o.   MRN: [id number] [facility] PROGRESS NOTE  CATHELINE HIXON OEU:[id number] DOB: January 22, 1941 DOA: 11/30/2014 PCP: Gennette Pac, MD  Brief narrative:    74 year old female with past medical history of COPD on home oxygen, dyslipidemia, hypertension, osteoporosis who presented to [facility] ED with progressively worsening shortness of breath over past few weeks prior to this admission. Patient did not have associated fever or cough. She was recently put on prednisone taper by her primary care physician. On admission, blood work revealed mild leukocytosis of 13.5, hemoglobin 11.7 and the rest of the blood work unremarkable. Chest x-ray showed emphysema without acute disease. Patient was given nebulizer treatment in ED along with Solu-Medrol 125 mg IV but she continued to have wheezing and shortness of breath so she was admitted for further management of COPD exacerbation.  Assessment/Plan:    Principal problem: Acute respiratory failure with hypoxia / acute COPD exacerbation / leukocytosis - Patient with history of COPD on oxygen at home, 2 L nasal cannula. - Patient was started on a nebulizer treatment, DuoNeb every 4 hours scheduled and every 2 hours as needed.  - Patient was given Solu-Medrol 125 mg IV in ED. We have continued Solu-Medrol 60 mg IV daily. - Respiratory status is stable this morning. - We started patient on amoxicillin because of reports of congestion. Patient reports she feels low but better with that. - White blood cell count has normalized.  Active problems: Essential hypertension - continue benicar 40 mg daily  Dyslipidemia - Continue statin therapy   DVT prophylaxis:  - SCD's bilaterally    Code Status: Full.  Family Communication:  plan of care discussed with the patient Disposition Plan: Home when stable.   IV access:  Peripheral IV  Procedures and diagnostic studies:    Dg Chest 2 View (if  Patient Has Fever And/or Copd) 11/30/2014   Emphysema without acute disease.     Medical Consultants:  None  Other Consultants:  None  IAnti-Infectives:   Amoxicillin 11/30/2014 -->   Leisa Lenz, MD  [facility] Pager [phone]  If 7PM-7AM, please contact night-coverage [email or web address] Password [facility] 12/01/2014, 10:36 AM   LOS: 1 day    HPI/Subjective: No acute overnight events.  Objective: Filed Vitals:   11/30/14 1500 11/30/14 2032 11/30/14 2058 12/01/14 0440  BP: 110/59  119/70 129/69  Pulse: 102  108 80  Temp: 98.2 F (36.8 C)  97.1 F (36.2 C) 97.6 F (36.4 C)  TempSrc: Oral  Oral Oral  Resp: 20  18 18   Height:      Weight:      SpO2: 97% 95% 97% 100%    Intake/Output Summary (Last 24 hours) at 12/01/14 1036 Last data filed at 11/30/14 1500  Gross per 24 hour  Intake    360 ml  Output      0 ml  Net    360 ml    Exam:   General:  Pt is alert, awake, no distress  Cardiovascular: Regular rate and rhythm, S1/S2 (+)  Respiratory: Mild wheezing in upper lung lobes, no crackles  Abdomen: Soft, non tender, non distended, bowel sounds present  Extremities: No edema, pulses DP and PT palpable bilaterally  Neuro: Grossly nonfocal  Data Reviewed: Basic Metabolic Panel:  Recent Labs Lab 11/30/14 0852 12/01/14 0455  NA 135 136  K 4.3 4.2  CL 92* 95*  CO2 35* 33*  GLUCOSE 157* 150*  BUN 12 16  CREATININE 0.60 0.53  CALCIUM 9.7 9.6   Liver Function Tests:  Recent Labs Lab 12/01/14 0455  AST 16  ALT 17  ALKPHOS 86  BILITOT 0.5  PROT 5.6*  ALBUMIN 3.2*   No results for input(s): LIPASE, AMYLASE in the last 168 hours. No results for input(s): AMMONIA in the last 168 hours. CBC:  Recent Labs Lab 11/30/14 0852 12/01/14 0455  WBC 13.5* 6.9  HGB 11.7* 10.6*  HCT 36.9 33.1*  MCV 103.1* 100.9*  PLT 240 231   Cardiac Enzymes:  Recent Labs Lab 11/30/14 0852  TROPONINI <0.03   BNP: Invalid input(s):  POCBNP CBG:  Recent Labs Lab 12/01/14 0728  GLUCAP 132*    No results found for this or any previous visit (from the past 240 hour(s)).   Scheduled Meds: . amoxicillin  125 mg Oral 3 times per day  . aspirin EC  81 mg Oral Daily  . atorvastatin  80 mg Oral q1800  . calcium carbonate  500 mg of elemental calcium Oral BID WC  . ipratropium-albuterol  3 mL Nebulization Q4H  . irbesartan  300 mg Oral Daily  . methylPREDNISolone (SOLU-MEDROL) injection  60 mg Intravenous Q24H   Continuous Infusions:

## 2014-12-02 ENCOUNTER — Encounter (HOSPITAL_COMMUNITY): Payer: Self-pay | Admitting: Internal Medicine

## 2014-12-02 DIAGNOSIS — D638 Anemia in other chronic diseases classified elsewhere: Secondary | ICD-10-CM

## 2014-12-02 DIAGNOSIS — J9622 Acute and chronic respiratory failure with hypercapnia: Secondary | ICD-10-CM

## 2014-12-02 DIAGNOSIS — J9620 Acute and chronic respiratory failure, unspecified whether with hypoxia or hypercapnia: Secondary | ICD-10-CM | POA: Diagnosis present

## 2014-12-02 DIAGNOSIS — R6889 Other general symptoms and signs: Secondary | ICD-10-CM

## 2014-12-02 DIAGNOSIS — T380X5A Adverse effect of glucocorticoids and synthetic analogues, initial encounter: Secondary | ICD-10-CM

## 2014-12-02 DIAGNOSIS — R739 Hyperglycemia, unspecified: Secondary | ICD-10-CM | POA: Diagnosis present

## 2014-12-02 DIAGNOSIS — D72829 Elevated white blood cell count, unspecified: Secondary | ICD-10-CM

## 2014-12-02 LAB — GLUCOSE, CAPILLARY: GLUCOSE-CAPILLARY: 181 mg/dL — AB (ref 70–99)

## 2014-12-02 MED ORDER — METHYLPREDNISOLONE SODIUM SUCC 40 MG IJ SOLR
40.0000 mg | INTRAMUSCULAR | Status: DC
  Administered 2014-12-02: 40 mg via INTRAVENOUS
  Filled 2014-12-02 (×2): qty 1

## 2014-12-02 MED ORDER — SENNOSIDES-DOCUSATE SODIUM 8.6-50 MG PO TABS
1.0000 | ORAL_TABLET | Freq: Every evening | ORAL | Status: DC | PRN
Start: 2014-12-02 — End: 2014-12-04
  Administered 2014-12-02: 2 via ORAL
  Filled 2014-12-02: qty 2

## 2014-12-02 MED ORDER — ALUM & MAG HYDROXIDE-SIMETH 200-200-20 MG/5ML PO SUSP
30.0000 mL | ORAL | Status: DC | PRN
  Administered 2014-12-02: 30 mL via ORAL
  Filled 2014-12-02: qty 30

## 2014-12-02 NOTE — Plan of Care (Signed)
Problem: Phase I Progression Outcomes Goal: Pt OOB to Walk or Exercise Daily With Nursing or PT Patient OOB to walk or exercise daily with nursing or PT if activity order permits  Outcome: Not Progressing Patient gets OOB to [facility] r/t SOB and anxiety

## 2014-12-02 NOTE — Care Management Note (Signed)
CARE MANAGEMENT NOTE 12/02/2014  [name], [name]   Account Number:  [id number]  Date Initiated:  12/01/2014  Documentation initiated by:  Marney Doctor  Subjective/Objective Assessment:   74 yo admitted with COPD     Action/Plan:   From home alone   Anticipated DC Date:  12/04/2014   Anticipated DC Plan:  [place]  CM consult      Choice offered to / List presented to:             Status of service:  In process, will continue to follow Medicare Important Message given?  YES (If response is "NO", the following Medicare IM given date fields will be blank) Date Medicare IM given:  12/02/2014 Medicare IM given by:  Marney Doctor Date Additional Medicare IM given:   Additional Medicare IM given by:    Discharge Disposition:    Per UR Regulation:  Reviewed for med. necessity/level of care/duration of stay  If discussed at [place] of Stay Meetings, dates discussed:    Comments:  12/02/14 Marney Doctor RN,BSN,NCM Met with pt to discuss DC plan and insurance questions.  Pt very anxious about going home too soon and being alone without any family assistance.  Pt provided with [place] list per her request.  Pt has a home nebulizer and home O2 supplied by [facility].  Pt has used [facility] for [facility] services previously and would like to use them again. Orders written for HHPT/OT/RN/Aide.  [facility] rep Erasmo Downer checked to make sure that they take pts insurance.  When insurance confirmed Erasmo Downer spoke with pt to answer insurance questions.  CM will continue to follow and assist with DC needs.  12/01/14 Marney Doctor RN,BSN,NCM [phone] [place] orders written.  Will await PT eval and assist with DC planning as needed.

## 2014-12-02 NOTE — Evaluation (Signed)
Physical Therapy Evaluation Patient Details Name: [name] MRN: [id number] DOB: 12-24-1940 Today's Date: 12/02/2014   History of Present Illness  Admitted with SOB 2* COPD exasperation - unable to walk more than a few feet at home.  Clinical Impression  Pt pleasant and motivated but very anxious 2* difficulty breathing and concerns of being sent home too soon.  Pt currently ambulating ltd distance (48' with several standing rests) with min guard assist but desat to 88% on 2L O2 with HR elevated to 120BPM.  Pts plan is d/c home with intermittent assist of friends.    Follow Up Recommendations No PT follow up    Equipment Recommendations  Other (comment)    Recommendations for Other Services OT consult (For energy conservation)     Precautions / Restrictions Precautions Precautions: Fall Restrictions Weight Bearing Restrictions: No      Mobility  Bed Mobility Overal bed mobility: Modified Independent             General bed mobility comments: Pt assisted self to EOB  Transfers Overall transfer level: Needs assistance Equipment used: None Transfers: Sit to/from Stand Sit to Stand: Supervision            Ambulation/Gait Ambulation/Gait assistance: Min guard;Supervision Ambulation Distance (Feet): 48 Feet (ambulated 48' twice with rest breaks 2* SOB) Assistive device: None Gait Pattern/deviations: WFL(Within Functional Limits) Gait velocity: decr   General Gait Details: Pt demonstrating good balance and saftey awareness but ltd by fatigue/SOB with exertion.  Pt desat to 88% on 2L O2 with HR elevated to 120  Stairs            Wheelchair Mobility    Modified Rankin (Stroke Patients Only)       Balance Overall balance assessment: No apparent balance deficits (not formally assessed)                                           Pertinent Vitals/Pain Pain Assessment: No/denies pain    Home Living Family/patient expects to be  discharged to:: Private residence Living Arrangements: Alone Available Help at Discharge: Friend(s);Available PRN/intermittently Type of Home: House Home Access: Level entry     Home Layout: One level Home Equipment: None      Prior Function Level of Independence: Independent         Comments: Did not use assistive device.     Hand Dominance   Dominant Hand: Right    Extremity/Trunk Assessment   Upper Extremity Assessment: Overall WFL for tasks assessed           Lower Extremity Assessment: Overall WFL for tasks assessed      Cervical / Trunk Assessment: Normal  Communication   Communication: No difficulties  Cognition Arousal/Alertness: Awake/alert Behavior During Therapy: WFL for tasks assessed/performed;Anxious Overall Cognitive Status: Within Functional Limits for tasks assessed                      General Comments      Exercises        Assessment/Plan    PT Assessment Patient needs continued PT services  PT Diagnosis Other (comment)   PT Problem List Decreased activity tolerance  PT Treatment Interventions DME instruction;Gait training;Patient/family education   PT Goals (Current goals can be found in the Care Plan section) Acute Rehab PT Goals Patient Stated Goal: Breathe better before I have to  go home PT Goal Formulation: With patient Time For Goal Achievement: 12/16/14 Potential to Achieve Goals: Good    Frequency Min 3X/week   Barriers to discharge Decreased caregiver support Pt states she is attempting to arrange some assist at home from friends - no family in area    Co-evaluation               End of Session   Activity Tolerance: Patient limited by fatigue;Other (comment) (SOB wth exertion) Patient left: in bed;with call bell/phone within reach Nurse Communication: Mobility status;Other (comment) (SaO2 and HR)         Time: 0865-7846 PT Time Calculation (min) (ACUTE ONLY): 16 min   Charges:   PT  Evaluation $Initial PT Evaluation Tier I: 1 Procedure     PT G Codes:        Andruw Battie 12/31/2014, 10:40 AM

## 2014-12-02 NOTE — Progress Notes (Addendum)
Progress Note   AUDI CONOVER QQI:[id number] DOB: [birth year] DOA: 11/30/2014 PCP: Gennette Pac, MD   Brief Narrative:   [name] is an 74 y.o. female with past medical history of COPD/chronic respiratory failure on home oxygen, dyslipidemia, hypertension, osteoporosis who was admitted 11/30/14 with a chief complaint of progressively worsening shortness of breath over past few weeks prior to this admission. Patient did not have associated fever or cough. She was recently put on prednisone taper by her primary care physician. On admission, blood work revealed mild leukocytosis of 13.5, hemoglobin 11.7 and the rest of the blood work unremarkable. Chest x-ray showed emphysema without acute disease. Patient was given nebulizer treatment in ED along with Solu-Medrol 125 mg IV but she continued to have wheezing and shortness of breath so she was admitted for further management of COPD exacerbation.  Assessment/Plan:   Principal Problem:   Acute on chronic respiratory failure secondary to COPD exacerbation/leukocytosis  Continue supplemental oxygen, nebulized bronchodilator treatments, IV steroids.  Wean steroids as tolerated.  Leukocytosis resolved. Chest x-ray negative for pneumonia however being treated with empiric amoxicillin.  Active Problems:   Steroid-induced hyperglycemia  Weaning steroids.    Activity intolerance  Evaluated by physical therapy. Activities limited by fatigue. Will need home therapy.    HTN (hypertension)  Continue Benicar.    Anemia of chronic disease  Hemoglobin stable with no current indication for transfusion.    DVT Prophylaxis  Continue SCDs.  Code Status: Full. Family Communication: No family at the bedside. Disposition Plan: Home when stable.   IV Access:    Peripheral IV   Procedures and diagnostic studies:   Dg Chest 2 View (if Patient Has Fever And/or Copd) 11/30/2014: Emphysema without acute disease.     Medical  Consultants:    None.  Anti-Infectives:    Amoxicillin 11/30/14--->  Subjective:    [name] is a bit anxious, and does not feel ready for discharge home. She lives alone and feels that she is too weak for discharge. Overall, less dyspneic.  Objective:    Filed Vitals:   12/01/14 1917 12/01/14 2056 12/02/14 0446 12/02/14 0757  BP:  128/61 152/70   Pulse:  94 92   Temp:  98.3 F (36.8 C) 97.7 F (36.5 C)   TempSrc:  Oral Oral   Resp:  18 18   Height:      Weight:      SpO2: 96% 97% 100% 100%    Intake/Output Summary (Last 24 hours) at 12/02/14 0808 Last data filed at 12/02/14 0447  Gross per 24 hour  Intake    900 ml  Output      0 ml  Net    900 ml    Exam: Gen:  NAD, anxious Cardiovascular:  RRR, No M/R/G Respiratory:  Lungs diminished Gastrointestinal:  Abdomen soft, NT/ND, + BS Extremities:  No C/E/C   Data Reviewed:    Labs: Basic Metabolic Panel:  Recent Labs Lab 11/30/14 0852 12/01/14 0455  NA 135 136  K 4.3 4.2  CL 92* 95*  CO2 35* 33*  GLUCOSE 157* 150*  BUN 12 16  CREATININE 0.60 0.53  CALCIUM 9.7 9.6   GFR Estimated Creatinine Clearance: 41.7 mL/min (by C-G formula based on Cr of 0.53). Liver Function Tests:  Recent Labs Lab 12/01/14 0455  AST 16  ALT 17  ALKPHOS 86  BILITOT 0.5  PROT 5.6*  ALBUMIN 3.2*   CBC:  Recent Labs Lab 11/30/14  7711 12/01/14 0455  WBC 13.5* 6.9  HGB 11.7* 10.6*  HCT 36.9 33.1*  MCV 103.1* 100.9*  PLT 240 231   Cardiac Enzymes:  Recent Labs Lab 11/30/14 0852  TROPONINI <0.03   CBG:  Recent Labs Lab 12/01/14 0728 12/02/14 0754  GLUCAP 132* 181*   Microbiology No results found for this or any previous visit (from the past 240 hour(s)).   Medications:   . amoxicillin  125 mg Oral 3 times per day  . aspirin EC  81 mg Oral Daily  . atorvastatin  80 mg Oral q1800  . calcium carbonate  500 mg of elemental calcium Oral BID WC  . ipratropium-albuterol  3 mL Nebulization  Q4H  . irbesartan  300 mg Oral Daily  . methylPREDNISolone (SOLU-MEDROL) injection  60 mg Intravenous Q24H   Continuous Infusions:   Time spent: 25 minutes.   LOS: 2 days   Guila Owensby  [facility] Pager [phone]. If unable to reach me by pager, please call my cell phone at [phone].  *Please refer to [email or web address], password [email or web address] to get updated schedule on who will round on this patient, as hospitalists switch teams weekly. If 7PM-7AM, please contact night-coverage at [email or web address], password [email or web address] for any overnight needs.  12/02/2014, 8:08 AM

## 2014-12-03 DIAGNOSIS — R739 Hyperglycemia, unspecified: Secondary | ICD-10-CM

## 2014-12-03 LAB — GLUCOSE, CAPILLARY: GLUCOSE-CAPILLARY: 126 mg/dL — AB (ref 70–99)

## 2014-12-03 MED ORDER — ALPRAZOLAM 0.25 MG PO TABS: 0.2500 mg | ORAL_TABLET | Freq: Every evening | ORAL | Status: DC | PRN

## 2014-12-03 MED ORDER — METHYLPREDNISOLONE SODIUM SUCC 40 MG IJ SOLR
40.0000 mg | Freq: Once | INTRAMUSCULAR | Status: AC
  Administered 2014-12-03: 40 mg via INTRAVENOUS

## 2014-12-03 MED ORDER — PREDNISONE 20 MG PO TABS
40.0000 mg | ORAL_TABLET | Freq: Every day | ORAL | Status: DC
  Administered 2014-12-04: 40 mg via ORAL
  Filled 2014-12-03 (×2): qty 2

## 2014-12-03 MED ORDER — FLEET ENEMA 7-19 GM/118ML RE ENEM
1.0000 | ENEMA | Freq: Once | RECTAL | Status: AC
Start: 2014-12-03 — End: 2014-12-03
  Administered 2014-12-03: 1 via RECTAL
  Filled 2014-12-03: qty 1

## 2014-12-03 MED ORDER — PREDNISONE (PAK) 10 MG PO TABS: ORAL_TABLET | ORAL | Status: DC

## 2014-12-03 MED ORDER — BISACODYL 10 MG RE SUPP
10.0000 mg | Freq: Once | RECTAL | Status: AC
  Administered 2014-12-03: 10 mg via RECTAL
  Filled 2014-12-03: qty 1

## 2014-12-03 MED ORDER — ALBUTEROL SULFATE HFA 108 (90 BASE) MCG/ACT IN AERS: 2.0000 | INHALATION_SPRAY | Freq: Four times a day (QID) | RESPIRATORY_TRACT | Status: DC | PRN

## 2014-12-03 NOTE — Progress Notes (Signed)
Clinical Social Work Department BRIEF PSYCHOSOCIAL ASSESSMENT 12/03/2014  [name], [name]     Account Number:  [id number]     Admit date:  11/30/2014  Clinical Social Worker:  Maryln Manuel  Date/Time:  12/02/2014 04:30 PM  Referred by:  RN  Date Referred:  12/02/2014 Referred for  ALF Placement   Other Referral:   Interview type:  Patient Other interview type:    PSYCHOSOCIAL DATA Living Status:  ALONE Admitted from facility:   Level of care:   Primary support name:  [name]/friend/[phone] Primary support relationship to patient:  FRIEND Degree of support available:   adequate    CURRENT CONCERNS Current Concerns  Other - See comment   Other Concerns:   pt requesting list of ALF facilities    SOCIAL WORK ASSESSMENT / PLAN CSW received referral from RN and RNCM that pt requesting list of ALF facilities. Per RNCM, pt plans to return home with home health services and private duty care, but is interested in beginning to explore ALF possibilities.    CSW met with pt at bedside. CSW introduced self and explained role. Pt shared that she lives alone. Pt shared that she has hired a Retail banker company to assist with her care at home upon discharge. Pt discussed that she feels it is time to begin to look into options for ALF. CSW discussed with pt that insurance does not cover ALF and pt would have to pay privately. Pt expressed understanding. CSW provided list of ALF facilities to pt. Pt agreeable to CSW initiating a search to ALF facilities to determine some options for pt. Pt agreeable to having home health social worker at home upon discharge to continue to assist with the process of ALF placement.    CSW completed FL2 and initiated search to ALF facilities.    CSW to follow up with pt to provide any options that may be available.    CSW to continue to follow to provide support and resources as pt is in the beginning stages of looking into ALF placement.    Assessment/plan status:  Psychosocial Support/Ongoing Assessment of Needs Other assessment/ plan:   discharge planning   Information/referral to community resources:   ALF facilities list    PATIENT'S/FAMILY'S RESPONSE TO PLAN OF CARE: Pt alert and oriented x 4. Pt anxious about returning home, but feels that with the help of private duty care that she will be able to manage. Pt eager to begin to explore options for ALF and is considering going to an ALF long term.   Alison Murray, MSW, [place] [facility] [phone]

## 2014-12-03 NOTE — Progress Notes (Signed)
Progress Note   [name] [name] DOB: 11-19-1940 DOA: 11/30/2014 PCP: Gennette Pac, MD   Brief Narrative:   [name] is an 74 y.o. female with past medical history of COPD/chronic respiratory failure on home oxygen, dyslipidemia, hypertension, osteoporosis who was admitted 11/30/14 with a chief complaint of progressively worsening shortness of breath over past few weeks prior to this admission. Patient did not have associated fever or cough. She was recently put on prednisone taper by her primary care physician. On admission, blood work revealed mild leukocytosis of 13.5, hemoglobin 11.7 and the rest of the blood work unremarkable. Chest x-ray showed emphysema without acute disease. Patient was given nebulizer treatment in ED along with Solu-Medrol 125 mg IV but she continued to have wheezing and shortness of breath so she was admitted for further management of COPD exacerbation.  Assessment/Plan:   Principal Problem:   Acute on chronic respiratory failure secondary to COPD exacerbation/leukocytosis  Continue supplemental oxygen, nebulized bronchodilator treatments, IV steroids.  Wean steroids as tolerated.  Leukocytosis resolved. Chest x-ray negative for pneumonia however being treated with empiric amoxicillin.  Active Problems:   Steroid-induced hyperglycemia  Weaning steroids.    Activity intolerance  Evaluated by physical therapy. Activities limited by fatigue. Will need home therapy.    HTN (hypertension)  Continue Benicar.    Anemia of chronic disease  Hemoglobin stable with no current indication for transfusion.    DVT Prophylaxis  Continue SCDs.  Code Status: Full. Family Communication: No family at the bedside. Disposition Plan: Home when stable.   IV Access:    Peripheral IV   Procedures and diagnostic studies:   Dg Chest 2 View (if Patient Has Fever And/or Copd) 11/30/2014: Emphysema without acute disease.     Medical  Consultants:    None.  Anti-Infectives:    Amoxicillin 11/30/14--->  Subjective:   [name] is still a bit anxious, and does not feel ready for discharge home. She lives alone and feels that she is too weak for discharge. Overall, less dyspneic. Reports that she has hired a Armed forces training and education officer who will be available tomorrow to sit with her.  Objective:    Filed Vitals:   12/02/14 2300 12/03/14 0312 12/03/14 0513 12/03/14 0729  BP:   110/68   Pulse:   81   Temp:   97.6 F (36.4 C)   TempSrc:   Oral   Resp:   18   Height:      Weight:      SpO2: 98% 98% 100% 97%    Intake/Output Summary (Last 24 hours) at 12/03/14 6834 Last data filed at 12/02/14 1400  Gross per 24 hour  Intake    480 ml  Output      0 ml  Net    480 ml    Exam: Gen:  NAD, anxious Cardiovascular:  RRR, No M/R/G Respiratory:  Lungs diminished Gastrointestinal:  Abdomen soft, NT/ND, + BS Extremities:  No C/E/C   Data Reviewed:    Labs: Basic Metabolic Panel:  Recent Labs Lab 11/30/14 0852 12/01/14 0455  NA 135 136  K 4.3 4.2  CL 92* 95*  CO2 35* 33*  GLUCOSE 157* 150*  BUN 12 16  CREATININE 0.60 0.53  CALCIUM 9.7 9.6   GFR Estimated Creatinine Clearance: 41.7 mL/min (by C-G formula based on Cr of 0.53). Liver Function Tests:  Recent Labs Lab 12/01/14 0455  AST 16  ALT 17  ALKPHOS 86  BILITOT  0.5  PROT 5.6*  ALBUMIN 3.2*   CBC:  Recent Labs Lab 11/30/14 0852 12/01/14 0455  WBC 13.5* 6.9  HGB 11.7* 10.6*  HCT 36.9 33.1*  MCV 103.1* 100.9*  PLT 240 231   Cardiac Enzymes:  Recent Labs Lab 11/30/14 0852  TROPONINI <0.03   CBG:  Recent Labs Lab 12/01/14 0728 12/02/14 0754 12/03/14 0748  GLUCAP 132* 181* 126*   Microbiology No results found for this or any previous visit (from the past 240 hour(s)).   Medications:   . amoxicillin  125 mg Oral 3 times per day  . aspirin EC  81 mg Oral Daily  . atorvastatin  80 mg Oral q1800  . calcium carbonate   500 mg of elemental calcium Oral BID WC  . ipratropium-albuterol  3 mL Nebulization Q4H  . irbesartan  300 mg Oral Daily  . methylPREDNISolone (SOLU-MEDROL) injection  40 mg Intravenous Q24H   Continuous Infusions:   Time spent: 25 minutes.   LOS: 3 days   [facility] Pager [phone]. If unable to reach me by pager, please call my cell phone at [phone].  *Please refer to [email or web address], password [email or web address] to get updated schedule on who will round on this patient, as hospitalists switch teams weekly. If 7PM-7AM, please contact night-coverage at [email or web address], password [email or web address] for any overnight needs.  12/03/2014, 8:22 AM

## 2014-12-03 NOTE — Progress Notes (Signed)
CSW continuing to follow.  CSW followed up with pt at bedside and provided responses from ALF facilities that pt received at this time. CSW provided pt with Senior Living Guide book that has information on local assisted living facilities. Pt appreciative.  Pt plans to discharge home with home health services and private duty care.  No further social work needs identified at this time.  CSW signing off.   Alison Murray, MSW, [place] [facility] [phone]

## 2014-12-03 NOTE — Progress Notes (Signed)
Nutrition Brief Note  Pt seen for low BMI. BMI is 17.6. Pt reports that she does not need nutritional intervention at this time. She is eating well (PO 75%). She reports no recent weight loss.   No further nutrition interventions warranted at this time.  Please re-consult as needed.   Laurette Schimke MS, RD, LDN

## 2014-12-04 LAB — GLUCOSE, CAPILLARY: Glucose-Capillary: 96 mg/dL (ref 70–99)

## 2014-12-04 MED ORDER — PREDNISONE 20 MG PO TABS: 40.0000 mg | ORAL_TABLET | Freq: Every day | ORAL | Status: DC

## 2014-12-04 NOTE — Discharge Summary (Signed)
Physician Discharge Summary  [name] QMG:[id number] DOB: 01-01-1941 DOA: 11/30/2014  PCP: Gennette Pac, MD  Admit date: 11/30/2014 Discharge date: 12/04/2014   Recommendations for Outpatient Follow-Up:   1. Home health PT/RN/Aide/SW set up at discharge. 2. Has follow up arranged with Dr. Gwenette Greet. 3. Recommend close follow up with PCP given anxiety, frailty.   Discharge Diagnosis:   Principal Problem:    Acute on chronic respiratory failure Active Problems:    HTN (hypertension)    Leukocytosis    Anemia of chronic disease    COPD with exacerbation    Activity intolerance    Steroid-induced hyperglycemia   Discharge Condition: Improved.  Diet recommendation: Low sodium, heart healthy.     History of Present Illness:   [name] is an 74 y.o. female with past medical history of COPD/chronic respiratory failure on home oxygen, dyslipidemia, hypertension, osteoporosis who was admitted 11/30/14 with a chief complaint of progressively worsening shortness of breath over past few weeks prior to this admission. Patient did not have associated fever or cough. She was recently put on prednisone taper by her primary care physician. On admission, blood work revealed mild leukocytosis of 13.5, hemoglobin 11.7 and the rest of the blood work unremarkable. Chest x-ray showed emphysema without acute disease. Patient was given nebulizer treatment in ED along with Solu-Medrol 125 mg IV but she continued to have wheezing and shortness of breath so she was admitted for further management of COPD exacerbation.   Hospital Course by Problem:   Principal Problem:  Acute on chronic respiratory failure secondary to COPD exacerbation/leukocytosis  Continue supplemental oxygen, nebulized bronchodilator treatments, prednisone taper.  Leukocytosis resolved. Chest x-ray negative for pneumonia however was treated with empiric amoxicillin which we have discontinued.  Active  Problems:  Steroid-induced hyperglycemia  Weaning steroids.   Activity intolerance  Evaluated by physical therapy. Activities limited by fatigue. Will need home therapy.   HTN (hypertension)  Continue Benicar.   Anemia of chronic disease  Hemoglobin stable with no current indication for transfusion.    Medical Consultants:    None.   Discharge Exam:   Filed Vitals:   12/04/14 0507  BP: 122/73  Pulse: 73  Temp: 97.4 F (36.3 C)  Resp: 18   Filed Vitals:   12/03/14 1420 12/03/14 1956 12/04/14 0507 12/04/14 0848  BP: 112/78  122/73   Pulse: 86  73   Temp: 97.8 F (36.6 C)  97.4 F (36.3 C)   TempSrc: Oral  Oral   Resp: 18  18   Height:      Weight:      SpO2: 97% 99% 100% 98%    Gen:  NAD, anxious Cardiovascular:  RRR, No M/R/G Respiratory: Lungs diminished Gastrointestinal: Abdomen soft, NT/ND with normal active bowel sounds. Extremities: No C/E/C   The results of significant diagnostics from this hospitalization (including imaging, microbiology, ancillary and laboratory) are listed below for reference.     Procedures and Diagnostic Studies:   Dg Chest 2 View (if Patient Has Fever And/or Copd) 11/30/2014: Emphysema without acute disease.    Labs:   Basic Metabolic Panel:  Recent Labs Lab 11/30/14 0852 12/01/14 0455  NA 135 136  K 4.3 4.2  CL 92* 95*  CO2 35* 33*  GLUCOSE 157* 150*  BUN 12 16  CREATININE 0.60 0.53  CALCIUM 9.7 9.6   GFR Estimated Creatinine Clearance: 41.7 mL/min (by C-G formula based on Cr of 0.53). Liver Function Tests:  Recent Labs Lab 12/01/14  0455  AST 16  ALT 17  ALKPHOS 86  BILITOT 0.5  PROT 5.6*  ALBUMIN 3.2*   CBC:  Recent Labs Lab 11/30/14 0852 12/01/14 0455  WBC 13.5* 6.9  HGB 11.7* 10.6*  HCT 36.9 33.1*  MCV 103.1* 100.9*  PLT 240 231   Cardiac Enzymes:  Recent Labs Lab 11/30/14 0852  TROPONINI <0.03   BNP: Invalid input(s): POCBNP CBG:  Recent Labs Lab 12/01/14 0728  12/02/14 0754 12/03/14 0748 12/04/14 0804  GLUCAP 132* 181* 126* 96    Discharge Instructions:   Discharge Instructions    Call MD for:  difficulty breathing, headache or visual disturbances    Complete by:  As directed      Call MD for:  extreme fatigue    Complete by:  As directed      Call MD for:  persistant dizziness or light-headedness    Complete by:  As directed      Call MD for:  temperature >100.4    Complete by:  As directed      Diet general    Complete by:  As directed      Discharge instructions    Complete by:  As directed   You were cared for by Dr. Jacquelynn Cree  (a hospitalist) during your hospital stay. If you have any questions about your discharge medications or the care you received while you were in the hospital after you are discharged, you can call the unit and ask to speak with the hospitalist on call if the hospitalist that took care of you is not available. Once you are discharged, your primary care physician will handle any further medical issues. Please note that NO REFILLS for any discharge medications will be authorized once you are discharged, as it is imperative that you return to your primary care physician (or establish a relationship with a primary care physician if you do not have one) for your aftercare needs so that they can reassess your need for medications and monitor your lab values.  Any outstanding tests can be reviewed by your PCP at your follow up visit.  It is also important to review any medicine changes with your PCP.  Please bring these d/c instructions with you to your next visit so your physician can review these changes with you.  If you do not have a primary care physician, you can call [phone] for a physician referral.  It is highly recommended that you obtain a PCP for hospital follow up.     Face-to-face encounter (required for Medicare/Medicaid patients)    Complete by:  As directed   I [name],[name] certify that this patient is  under my care and that I, or a nurse practitioner or physician's assistant working with me, had a face-to-face encounter that meets the physician face-to-face encounter requirements with this patient on 12/04/2014. The encounter with the patient was in whole, or in part for the following medical condition(s) which is the primary reason for home health care (List medical condition): COPD exacerbation, high risk for re-hospitalization due to underlying frailty.  Need skilled assessment by RN with early intervention for symptoms.  The encounter with the patient was in whole, or in part, for the following medical condition, which is the primary reason for home health care:  copd exacerbation  I certify that, based on my findings, the following services are medically necessary home health services:   Nursing Physical therapy    Reason for Medically Necessary Home Health Services:  Skilled Nursing- Skilled Assessment/Observation Skilled Nursing- Teaching of Disease Process/Symptom Management Therapy- Therapeutic Exercises to Increase Strength and Endurance    My clinical findings support the need for the above services:  Shortness of breath with activity  Further, I certify that my clinical findings support that this patient is homebound due to:  Shortness of Breath with activity     Home Health    Complete by:  As directed   To provide the following care/treatments:   PT [place] work       Increase activity slowly    Complete by:  As directed             Medication List    TAKE these medications        albuterol (2.5 MG/3ML) 0.083% nebulizer solution  Commonly known as:  PROVENTIL  Take 3 mLs (2.5 mg total) by nebulization every 6 (six) hours as needed. Dx: 492.8     albuterol 108 (90 BASE) MCG/ACT inhaler  Commonly known as:  PROAIR HFA  Inhale 2 puffs into the lungs every 6 (six) hours as needed for wheezing or shortness of breath.     ALPRAZolam 0.25 MG tablet    Commonly known as:  XANAX  Take 1 tablet (0.25 mg total) by mouth at bedtime as needed for sleep.     aspirin 81 MG tablet  Take 81 mg by mouth daily.     atorvastatin 80 MG tablet  Commonly known as:  LIPITOR  Take 80 mg by mouth daily.     BENICAR 40 MG tablet  Generic drug:  olmesartan  Take 1 tablet by mouth daily.     benzonatate 100 MG capsule  Commonly known as:  TESSALON  Take 1 capsule (100 mg total) by mouth 3 (three) times daily as needed for cough.     CALTRATE 600 1500 (600 CA) MG Tabs  Generic drug:  Calcium Carbonate  Take 1 tablet by mouth 2 (two) times daily.     Fish Oil 1000 MG Caps  Take 1 capsule by mouth 2 (two) times daily.     Lutein 20 MG Caps  Take 1 capsule by mouth daily.     multivitamin with minerals Tabs tablet  Take 1 tablet by mouth daily.     predniSONE 20 MG tablet  Commonly known as:  DELTASONE  Take 2 tablets (40 mg total) by mouth daily before breakfast. Take 40 mg 12/05/14, then 30 mg (1.5 tabs) x 2 days, then 20 mg x 2 days, then 10 mg x 2 days then stop.     risedronate 150 MG tablet  Commonly known as:  ACTONEL  Take 150 mg by mouth every 30 (thirty) days. with water on empty stomach, nothing by mouth or lie down for next 30 minutes.     SYMBICORT 160-4.5 MCG/ACT inhaler  Generic drug:  budesonide-formoterol  INHALE 2 PUFFS INTO THE LUNGS 2 (TWO) TIMES DAILY.     SYSTANE 0.4-0.3 % Soln  Generic drug:  Polyethyl Glycol-Propyl Glycol  Apply 1 drop to eye daily as needed (for dryness).     tiotropium 18 MCG inhalation capsule  Commonly known as:  SPIRIVA  Place 1 capsule (18 mcg total) into inhaler and inhale daily.           Follow-up Information    Follow up with Kathee Delton, MD.   Specialty:  Pulmonary Disease   Why:  At your appt time noted below.  Contact information:   [address] [place] [address] [phone]       Follow up with Gennette Pac, MD. Schedule an appointment as soon as possible  for a visit in 1 week.   Specialty:  Family Medicine   Why:  Hosptial follow up.   Contact information:   [place] [address] [phone]        Time coordinating discharge: 35 minutes.  Signed:  RAMA,[name]  Pager [phone] [facility] 12/04/2014, 9:42 AM

## 2014-12-09 ENCOUNTER — Ambulatory Visit (INDEPENDENT_AMBULATORY_CARE_PROVIDER_SITE_OTHER): Payer: Medicare Other | Admitting: Pulmonary Disease

## 2014-12-09 ENCOUNTER — Telehealth: Payer: Self-pay | Admitting: Pulmonary Disease

## 2014-12-09 ENCOUNTER — Encounter: Payer: Self-pay | Admitting: Pulmonary Disease

## 2014-12-09 VITALS — BP 142/80 | HR 111 | Temp 98.2°F | Ht 61.0 in | Wt 90.0 lb

## 2014-12-09 DIAGNOSIS — J441 Chronic obstructive pulmonary disease with (acute) exacerbation: Secondary | ICD-10-CM

## 2014-12-09 DIAGNOSIS — J9611 Chronic respiratory failure with hypoxia: Secondary | ICD-10-CM

## 2014-12-09 DIAGNOSIS — J438 Other emphysema: Secondary | ICD-10-CM

## 2014-12-09 MED ORDER — PREDNISONE 10 MG PO TABS: ORAL_TABLET | ORAL | Status: DC

## 2014-12-09 NOTE — Telephone Encounter (Signed)
Spoke with Margaretha Sheffield with [facility]-states she was called out to see the patient today(unscheduled) due to the patient feeling bad; pt is having a hard time with her O2 levels staying up and SOB with exertion. Pt was just d/c'd from hospital on Friday due to COPD, Anemia, and Hypertension.   Margaretha Sheffield states patient's PCP Dr Rex Kras wants patient to be seen today by our office due to breathing concerns but patient would rather not come in. Pt is scheduled to see Dr Rex Kras tomorrow as well.   [place] please advise----if you want pt seen today then I would have to double book you or bring patient in to see MR or MW this afternoon. Thanks.

## 2014-12-09 NOTE — Assessment & Plan Note (Addendum)
The patient has end-stage COPD, and just got out of the hospital for an acute exacerbation. Her prednisone has been tapered down to 10 mg a day, and she has seen increasing shortness of breath starting yesterday. She had significant hypoxemia today on pulsed oxygen, but had a saturation in the 90s on 2 L of continuous oxygen. It is obvious that she is not going to tolerate pulsed oxygen. She is also questioning whether her concentrator is putting out enough air, and we will have her home care company check flow. She is not having any symptoms to suggest an acute chest infection, and has no acute bronchospasm on exam. She does have mild right foot swelling, but does not have leg swelling or calf tenderness. If she continues to have significant shortness of breath going forward, she may need a VQ scan or CT angio to put the issue of thromboembolic disease to rest.  She did have a CT angio 3 weeks ago that showed no evidence of a pulmonary embolus. I have also had a long discussion with her about her prognosis, and that if she continues to have issues we may need to get hospice involved to help with her comfort. The patient was totally onboard with this. Will increase her prednisone for a short period of time, and will also leave her on a chronic dose at 10 mg a day.

## 2014-12-09 NOTE — Patient Instructions (Signed)
You cannot wear pulsed oxygen.  Will have to put on continuous Use an albuterol treatment every morning first thing upon arising.  Can then take your usual meds about an hour later.  Will give you prednisone pulse over 12 days, then stay on 10mg  a day indefinitely Let me know if you are having worsening swelling in right foot/leg Will see you back in 2 weeks, but call if not feeling better.

## 2014-12-09 NOTE — Progress Notes (Signed)
   Subjective:    Patient ID: [name], female    DOB: Jun 19, 1941, 74 y.o.   MRN: [id number]  HPI The patient comes in today for an acute sick visit. She has known end-stage COPD with chronic respiratory failure, and recently was in the hospital for an acute exacerbation. She was doing okay and told last night, when she noticed falling saturations walking to the bathroom. This continued to be an issue this morning, even when on her concentrator. She is not having any increased cough, congestion, or mucus. She just has significant dyspnea on exertion.  He denies any pleuritic chest pain, but has had some right foot swelling since discharge. She was noted to have low saturations on pulsed oxygen, but they actually normalized on low-flow continuous oxygen.   Review of Systems  Constitutional: Negative for fever and unexpected weight change.  HENT: Negative for congestion, dental problem, ear pain, nosebleeds, postnasal drip, rhinorrhea, sinus pressure, sneezing, sore throat and trouble swallowing.   Eyes: Negative for redness and itching.  Respiratory: Positive for shortness of breath. Negative for cough, chest tightness and wheezing.   Cardiovascular: Positive for leg swelling. Negative for palpitations.  Gastrointestinal: Negative for nausea and vomiting.  Genitourinary: Negative for dysuria.  Musculoskeletal: Negative for joint swelling.  Skin: Negative for rash.  Neurological: Negative for headaches.  Hematological: Does not bruise/bleed easily.  Psychiatric/Behavioral: Negative for dysphoric mood. The patient is not nervous/anxious.        Objective:   Physical Exam Thin female in no acute distress on continuous oxygen Nose without purulence or discharge noted Neck without JVD or lymphadenopathy Chest with very decreased breath sounds as usual, no active wheezing heard Cardiac exam with regular rate and rhythm Left lower extremity with no edema, right lower extremity with foot  edema, but does not extend into ankle or lower leg. No calf tenderness noted. Alert and oriented, moves all 4 extremities.       Assessment & Plan:

## 2014-12-09 NOTE — Telephone Encounter (Signed)
Spoke with [name] and given appt with Dr Gwenette Greet today at [facility]

## 2014-12-09 NOTE — Telephone Encounter (Signed)
Sounds like she needs to see someone.  If another doc has an available opening, go ahead and schedule.  If no openings, then you can double book me.

## 2014-12-23 ENCOUNTER — Ambulatory Visit (INDEPENDENT_AMBULATORY_CARE_PROVIDER_SITE_OTHER): Payer: Medicare Other | Admitting: Pulmonary Disease

## 2014-12-23 ENCOUNTER — Encounter: Payer: Self-pay | Admitting: Pulmonary Disease

## 2014-12-23 ENCOUNTER — Ambulatory Visit: Payer: Medicare Other | Admitting: Pulmonary Disease

## 2014-12-23 VITALS — BP 130/70 | HR 105 | Temp 97.2°F | Ht 61.0 in | Wt 86.6 lb

## 2014-12-23 DIAGNOSIS — J9611 Chronic respiratory failure with hypoxia: Secondary | ICD-10-CM

## 2014-12-23 DIAGNOSIS — J438 Other emphysema: Secondary | ICD-10-CM

## 2014-12-23 MED ORDER — BUDESONIDE-FORMOTEROL FUMARATE 160-4.5 MCG/ACT IN AERO: INHALATION_SPRAY | RESPIRATORY_TRACT | Status: DC

## 2014-12-23 MED ORDER — PREDNISONE 10 MG PO TABS: ORAL_TABLET | ORAL | Status: DC

## 2014-12-23 MED ORDER — TIOTROPIUM BROMIDE MONOHYDRATE 18 MCG IN CAPS: 18.0000 ug | ORAL_CAPSULE | Freq: Every day | RESPIRATORY_TRACT | Status: DC

## 2014-12-23 NOTE — Assessment & Plan Note (Signed)
The patient is much improved compared to the last visit. She is using continuous oxygen now instead of pulsed, and her saturations are excellent. She has also been treated with a pulse of prednisone, and we have made the decision to leave her on 10 mg a day.  I have asked her to continue on her current regimen, and stressed to her the importance of pushing caloric intake. It is unclear if her weight loss is secondary to a thyroid issue, or whether this is simply the cachexia of end-stage COPD.

## 2014-12-23 NOTE — Progress Notes (Signed)
   Subjective:    Patient ID: [name], female    DOB: [birth year], 74 y.o.   MRN: [id number]  HPI Patient comes in today for follow-up of her known end-stage COPD with chronic respiratory failure. At the last visit she was doing very poorly, and it was determined that she can no longer tolerate pulsed oxygen. Her saturations are excellent on 2 L today. We also treated her with a prednisone pulse, and will leave her on 10 mg a day. She has been working with rehabilitation at home, and has done well with this. He feels that her breathing is definitely better from the last visit. She has continued to lose weight, and tells me that her primary care doctor diagnosed her with a thyroid issue.   Review of Systems  Constitutional: Negative for fever and unexpected weight change.  HENT: Positive for congestion and postnasal drip. Negative for dental problem, ear pain, nosebleeds, rhinorrhea, sinus pressure, sneezing, sore throat and trouble swallowing.   Eyes: Negative for redness and itching.  Respiratory: Positive for shortness of breath. Negative for cough, chest tightness and wheezing.   Cardiovascular: Positive for leg swelling. Negative for palpitations.  Gastrointestinal: Negative for nausea and vomiting.  Genitourinary: Negative for dysuria.  Musculoskeletal: Negative for joint swelling.  Skin: Negative for rash.  Neurological: Negative for headaches.  Hematological: Does not bruise/bleed easily.  Psychiatric/Behavioral: Negative for dysphoric mood. The patient is not nervous/anxious.        Objective:   Physical Exam Thin and cachectic female in no acute distress Nose without purulence or discharge noted Neck without lymphadenopathy or thyromegaly Chest with very decreased breath sounds throughout, no definite wheezing Cardiac exam with distant heart sounds, but regular Lower extremities with mild right foot edema, no cyanosis Alert and oriented, moves all 4 extremities.       Assessment & Plan:

## 2014-12-23 NOTE — Patient Instructions (Signed)
Stay on prednisone 10mg  each day, and will send in prescription for 90 days with 1 fill Will refill inhaler medications Stay on continuous oxygen You need to push calories as much as possible. followup with me again in 84mos.

## 2015-01-04 ENCOUNTER — Encounter: Payer: Self-pay | Admitting: Endocrinology

## 2015-01-04 ENCOUNTER — Ambulatory Visit (INDEPENDENT_AMBULATORY_CARE_PROVIDER_SITE_OTHER): Payer: Medicare Other | Admitting: Endocrinology

## 2015-01-04 VITALS — BP 130/72 | HR 115 | Temp 98.6°F | Ht 61.0 in | Wt 87.8 lb

## 2015-01-04 DIAGNOSIS — I471 Supraventricular tachycardia: Secondary | ICD-10-CM

## 2015-01-04 DIAGNOSIS — R634 Abnormal weight loss: Secondary | ICD-10-CM

## 2015-01-04 DIAGNOSIS — R7989 Other specified abnormal findings of blood chemistry: Secondary | ICD-10-CM

## 2015-01-04 DIAGNOSIS — R000 Tachycardia, unspecified: Secondary | ICD-10-CM

## 2015-01-04 NOTE — Progress Notes (Signed)
Patient ID: [name], female   DOB: 03/22/1941, 74 y.o.   MRN: [id number]                                                                                                               Reason for Appointment:  ?  Hyperthyroidism, new consultation  Referring physician: Little   History of Present Illness:   She apparently has had thyroid functions checked with TSH levels annually with her PCP as part of her routine visits TSH has been consistently normal She was seen by her PCP in early 12/2014 and TSH level was done at that time She was being followed up for her COPD after hospitalization for exacerbation in late 1/16  Apparently she had lost about 7 pounds on this follow-up visit. She thinks her appetite is overall less generally Also she thinks she has felt her heart rate to be relatively fast at home.  Pulse rate in the doctors office was 84. She has been tending to get tired more easily and is not able to do much activities, this is gradual Does not necessarily feel hot but occasionally may feel warm without any sweating. Does not complain of any shakiness, excessive nervousness recently  She has not had any previous history of thyroid enlargement or other thyroid disease  Her TSH was 0.31, slightly low and her free T4 was 1.24, slightly high in the local lab  Lab Results  Component Value Date   TSH 0.974 10/29/2012        Medication List       This list is accurate as of: 01/04/15  2:06 PM.  Always use your most recent med list.               albuterol (2.5 MG/3ML) 0.083% nebulizer solution  Commonly known as:  PROVENTIL  Take 3 mLs (2.5 mg total) by nebulization every 6 (six) hours as needed. Dx: 492.8     albuterol 108 (90 BASE) MCG/ACT inhaler  Commonly known as:  PROAIR HFA  Inhale 2 puffs into the lungs every 6 (six) hours as needed for wheezing or shortness of breath.     ALPRAZolam 0.25 MG tablet  Commonly known as:  XANAX  Take 1 tablet (0.25 mg  total) by mouth at bedtime as needed for sleep.     aspirin 81 MG tablet  Take 81 mg by mouth daily.     atorvastatin 80 MG tablet  Commonly known as:  LIPITOR  Take 80 mg by mouth daily.     BENICAR 40 MG tablet  Generic drug:  olmesartan  Take 1 tablet by mouth daily.     benzonatate 100 MG capsule  Commonly known as:  TESSALON  Take 1 capsule (100 mg total) by mouth 3 (three) times daily as needed for cough.     budesonide-formoterol 160-4.5 MCG/ACT inhaler  Commonly known as:  SYMBICORT  INHALE 2 PUFFS INTO THE LUNGS 2 (TWO) TIMES DAILY.     CALTRATE 600 1500 (600 CA) MG  Tabs  Generic drug:  Calcium Carbonate  Take 1 tablet by mouth 2 (two) times daily.     Fish Oil 1000 MG Caps  Take 1 capsule by mouth 2 (two) times daily.     Lutein 20 MG Caps  Take 1 capsule by mouth daily.     multivitamin with minerals Tabs tablet  Take 1 tablet by mouth daily.     predniSONE 10 MG tablet  Commonly known as:  DELTASONE  1 tab daily     risedronate 150 MG tablet  Commonly known as:  ACTONEL  Take 150 mg by mouth every 30 (thirty) days. with water on empty stomach, nothing by mouth or lie down for next 30 minutes.     SYSTANE 0.4-0.3 % Soln  Generic drug:  Polyethyl Glycol-Propyl Glycol  Apply 1 drop to eye daily as needed (for dryness).     tiotropium 18 MCG inhalation capsule  Commonly known as:  SPIRIVA  Place 1 capsule (18 mcg total) into inhaler and inhale daily.            Past Medical History  Diagnosis Date  . Emphysema   . 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 Hyperlipidemia   . Hypertension   . Blood transfusion without reported diagnosis   . Emphysema of lung   . Osteoporosis   . SBO (small bowel obstruction) s/p LOA 11/07/2012 11/04/2012  . COPD (chronic obstructive pulmonary disease) with emphysema 11/05/2012    [facility] 2001:  FEV1 0.50, ratio 28 Pulmonary rehab 2013   . Chronic respiratory failure 07/15/2014    Past Surgical History  Procedure Laterality Date    . Abdominal hysterectomy    . Laparoscopy  11/07/2012    Procedure: LAPAROSCOPY DIAGNOSTIC;  Surgeon: Ralene Ok, MD;  Location: [facility];  Service: General;  Laterality: N/A;  . Laparotomy  11/07/2012    Procedure: EXPLORATORY LAPAROTOMY;  Surgeon: Ralene Ok, MD;  Location: [facility];  Service: General;  Laterality: N/A;  . Laparoscopic lysis of adhesions  11/07/2012    Procedure: LAPAROSCOPIC LYSIS OF ADHESIONS;  Surgeon: Ralene Ok, MD;  Location: [facility];  Service: General;  Laterality: N/A;  . Lysis of adhesion  11/07/2012    Procedure: LYSIS OF ADHESION;  Surgeon: Ralene Ok, MD;  Location: [facility];  Service: General;;    Family History  Problem Relation Age of Onset  . Heart failure Mother   . Heart attack Father   . Heart attack Paternal Grandmother     Social History:  reports that she quit smoking about 22 years ago. Her smoking use included Cigarettes. She has a 30 pack-year smoking history. She has never used smokeless tobacco. She reports that she does not use illicit drugs. Her alcohol history is not on file.  Allergies:  Allergies  Allergen Reactions  . Oxycodone-Acetaminophen Nausea Only and Other (See Comments)    Headaches     Review of Systems: .      There is no history of Diabetes.      Constitutional: She complains of weight loss and fatigue   Eyes: no history of blurred vision   ENT: no hoarseness   Cardiovascular: no chest pain or tightness on exertion.  Has had leg swelling at times.  No history of high blood pressure Pulse >100 at times at home when she checks her oxygen level  Respiratory: has cough/shortness of breath because of severe end-stage COPD, worse in 1/16.  Now requiring prednisone daily  Gastrointestinal:  More constipation recently, no nausea or abdominal pain  Musculoskeletal: no muscle/joint aches  Skin: no rash  Neurological: no headaches  Psychiatric: no new depression/anxiety    Examination:   BP 130/72 mmHg   Pulse 115  Temp(Src) 98.6 F (37 C) (Oral)  Ht 5\' 1"  (1.549 m)  Wt 87 lb 12.8 oz (39.826 kg)  BMI 16.60 kg/m2  SpO2 97%   General Appearance:  thinly built, pleasant, not anxious or hyperkinetic.  No pallor.         Eyes: No  prominence, lid lag or stare. No swelling of the eyelids  Neck: The thyroid is nonpalpable There is no lymphadenopathy .          Heart: normal S1 and S2, no murmurs .          Lungs: breath sounds are clear bilaterally Abdomen: no hepatosplenomegaly or other palpable abnormality  Extremities: hands are warm. No ankle edema. Neurological: Deep tendon reflexes at biceps are brisk. Bilateral tremors are present.    Assessment/Plan:  ?  Hyperthyroidism  Although her free T4 is higher than normal about 3 weeks ago and her TSH is slightly low at 0.31 not clear if this is indicative of hyperthyroidism as TSH should be significantly suppressed with hyperthyroidism Also the tests were done soon after she was admitted for acute illness related to her COPD and requiring large doses of prednisone.  She has multiple medical problems especially related to her COPD which could cause weight loss and mild tachycardia. Since her symptoms are rather nonspecific will need to repeat her thyroid functions including free T3 to establish the diagnosis and any treatment that may be needed   [facility] 01/04/2015, 2:06 PM

## 2015-01-11 ENCOUNTER — Ambulatory Visit: Payer: Medicare Other | Admitting: Pulmonary Disease

## 2015-01-12 ENCOUNTER — Telehealth: Payer: Self-pay | Admitting: Endocrinology

## 2015-01-12 ENCOUNTER — Other Ambulatory Visit: Payer: Self-pay | Admitting: *Deleted

## 2015-01-12 DIAGNOSIS — R7989 Other specified abnormal findings of blood chemistry: Secondary | ICD-10-CM

## 2015-01-12 NOTE — Telephone Encounter (Signed)
Pt wants to know lab results that was done here for T3 ,3  T4,4 and TSH  Call back at [phone]

## 2015-01-13 ENCOUNTER — Ambulatory Visit: Payer: Medicare Other | Admitting: Pulmonary Disease

## 2015-01-25 ENCOUNTER — Telehealth: Payer: Self-pay | Admitting: Endocrinology

## 2015-01-25 NOTE — Telephone Encounter (Signed)
She had labs drawn but  the result was never processed, please find out details

## 2015-01-25 NOTE — Telephone Encounter (Signed)
Please advise 

## 2015-01-25 NOTE — Telephone Encounter (Signed)
Please call pt today regarding blood test that was suppose to be done 12/04/14 and after week she called to get results but no one called her . She found out the test was never done because tube was dropped , please call pt back

## 2015-01-26 NOTE — Telephone Encounter (Signed)
The lab had broken her tube and she had been called to come in to be re-drawn, when I spoke with her today, she said she's nervous about having [facility] take care of this, and asked if she could go back to Dr. Eddie Dibbles office and have them drawn there and faxed to our office, I told her that was fine.

## 2015-02-03 ENCOUNTER — Telehealth: Payer: Self-pay | Admitting: Endocrinology

## 2015-02-03 NOTE — Telephone Encounter (Signed)
Asking for verbal of the lab draws-given

## 2015-03-13 ENCOUNTER — Inpatient Hospital Stay (HOSPITAL_COMMUNITY)
Admission: EM | Admit: 2015-03-13 | Discharge: 2015-03-16 | DRG: 190 | Disposition: A | Discharge status: Home / Self Care | Payer: Medicare Other | Attending: Family Medicine | Admitting: Family Medicine

## 2015-03-13 ENCOUNTER — Encounter (HOSPITAL_COMMUNITY): Payer: Self-pay | Admitting: Emergency Medicine

## 2015-03-13 DIAGNOSIS — Z8543 Personal history of malignant neoplasm of ovary: Secondary | ICD-10-CM

## 2015-03-13 DIAGNOSIS — R0603 Acute respiratory distress: Secondary | ICD-10-CM

## 2015-03-13 DIAGNOSIS — Z9981 Dependence on supplemental oxygen: Secondary | ICD-10-CM

## 2015-03-13 DIAGNOSIS — T380X5A Adverse effect of glucocorticoids and synthetic analogues, initial encounter: Secondary | ICD-10-CM | POA: Diagnosis present

## 2015-03-13 DIAGNOSIS — J9621 Acute and chronic respiratory failure with hypoxia: Secondary | ICD-10-CM | POA: Diagnosis present

## 2015-03-13 DIAGNOSIS — F1010 Alcohol abuse, uncomplicated: Secondary | ICD-10-CM | POA: Diagnosis present

## 2015-03-13 DIAGNOSIS — J441 Chronic obstructive pulmonary disease with (acute) exacerbation: Secondary | ICD-10-CM | POA: Diagnosis not present

## 2015-03-13 DIAGNOSIS — D638 Anemia in other chronic diseases classified elsewhere: Secondary | ICD-10-CM | POA: Diagnosis present

## 2015-03-13 DIAGNOSIS — J9622 Acute and chronic respiratory failure with hypercapnia: Secondary | ICD-10-CM | POA: Diagnosis present

## 2015-03-13 DIAGNOSIS — I10 Essential (primary) hypertension: Secondary | ICD-10-CM | POA: Diagnosis present

## 2015-03-13 DIAGNOSIS — F419 Anxiety disorder, unspecified: Secondary | ICD-10-CM | POA: Diagnosis present

## 2015-03-13 DIAGNOSIS — Z7952 Long term (current) use of systemic steroids: Secondary | ICD-10-CM

## 2015-03-13 DIAGNOSIS — M810 Age-related osteoporosis without current pathological fracture: Secondary | ICD-10-CM | POA: Diagnosis present

## 2015-03-13 DIAGNOSIS — J9620 Acute and chronic respiratory failure, unspecified whether with hypoxia or hypercapnia: Secondary | ICD-10-CM | POA: Diagnosis present

## 2015-03-13 DIAGNOSIS — R0602 Shortness of breath: Secondary | ICD-10-CM | POA: Diagnosis not present

## 2015-03-13 DIAGNOSIS — Z681 Body mass index (BMI) 19 or less, adult: Secondary | ICD-10-CM

## 2015-03-13 DIAGNOSIS — Z7982 Long term (current) use of aspirin: Secondary | ICD-10-CM

## 2015-03-13 DIAGNOSIS — E785 Hyperlipidemia, unspecified: Secondary | ICD-10-CM | POA: Diagnosis present

## 2015-03-13 DIAGNOSIS — Z8249 Family history of ischemic heart disease and other diseases of the circulatory system: Secondary | ICD-10-CM

## 2015-03-13 DIAGNOSIS — E43 Unspecified severe protein-calorie malnutrition: Secondary | ICD-10-CM | POA: Insufficient documentation

## 2015-03-13 DIAGNOSIS — Z87891 Personal history of nicotine dependence: Secondary | ICD-10-CM

## 2015-03-13 MED ORDER — IPRATROPIUM-ALBUTEROL 0.5-2.5 (3) MG/3ML IN SOLN
RESPIRATORY_TRACT | Status: AC
  Filled 2015-03-13: qty 3

## 2015-03-13 MED ORDER — ALBUTEROL (5 MG/ML) CONTINUOUS INHALATION SOLN
10.0000 mg/h | INHALATION_SOLUTION | RESPIRATORY_TRACT | Status: DC
  Administered 2015-03-14: 10 mg/h via RESPIRATORY_TRACT
  Filled 2015-03-13 (×2): qty 20

## 2015-03-13 MED ORDER — IPRATROPIUM-ALBUTEROL 0.5-2.5 (3) MG/3ML IN SOLN
3.0000 mL | Freq: Once | RESPIRATORY_TRACT | Status: AC
  Administered 2015-03-13: 3 mL via RESPIRATORY_TRACT

## 2015-03-13 NOTE — ED Provider Notes (Signed)
CSN: [id number]     Arrival date & time 03/13/15  2339 History   First MD Initiated Contact with Patient 03/13/15 2343     Chief Complaint  Patient presents with  . Shortness of Breath     (Consider location/radiation/quality/duration/timing/severity/associated sxs/prior Treatment) Patient is a 74 y.o. female presenting with shortness of breath.  Shortness of Breath Severity:  Moderate Onset quality:  Gradual Duration:  1 day Timing:  Constant Progression:  Worsening Chronicity:  New Relieved by:  Nothing Worsened by:  Nothing tried Ineffective treatments:  None tried Associated symptoms: no abdominal pain, no cough, no fever and no vomiting     Past Medical History  Diagnosis Date  . Emphysema   . 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 Hyperlipidemia   . Hypertension   . Blood transfusion without reported diagnosis   . Emphysema of lung   . Osteoporosis   . SBO (small bowel obstruction) s/p LOA 11/07/2012 11/04/2012  . COPD (chronic obstructive pulmonary disease) with emphysema 11/05/2012    [facility] 2001:  FEV1 0.50, ratio 28 Pulmonary rehab 2013   . Chronic respiratory failure 07/15/2014   Past Surgical History  Procedure Laterality Date  . Abdominal hysterectomy    . Laparoscopy  11/07/2012    Procedure: LAPAROSCOPY DIAGNOSTIC;  Surgeon: Ralene Ok, MD;  Location: [facility];  Service: General;  Laterality: N/A;  . Laparotomy  11/07/2012    Procedure: EXPLORATORY LAPAROTOMY;  Surgeon: Ralene Ok, MD;  Location: [facility];  Service: General;  Laterality: N/A;  . Laparoscopic lysis of adhesions  11/07/2012    Procedure: LAPAROSCOPIC LYSIS OF ADHESIONS;  Surgeon: Ralene Ok, MD;  Location: [facility];  Service: General;  Laterality: N/A;  . Lysis of adhesion  11/07/2012    Procedure: LYSIS OF ADHESION;  Surgeon: Ralene Ok, MD;  Location: [facility];  Service: General;;   Family History  Problem Relation Age of Onset  . Heart failure Mother   . Heart attack Father   . Heart attack  Paternal Grandmother    History  Substance Use Topics  . Smoking status: Former Smoker -- 1.00 packs/day for 30 years    Types: Cigarettes    Quit date: 11/06/1992  . Smokeless tobacco: Never Used  . Alcohol Use: Not on file   OB History    No data available     Review of Systems  Constitutional: Negative for fever.  Respiratory: Positive for shortness of breath. Negative for cough.   Gastrointestinal: Negative for vomiting and abdominal pain.  All other systems reviewed and are negative.     Allergies  Oxycodone-acetaminophen  Home Medications   Prior to Admission medications   Medication Sig Start Date End Date Taking? Authorizing Provider  albuterol (PROVENTIL) (2.5 MG/3ML) 0.083% nebulizer solution Take 3 mLs (2.5 mg total) by nebulization every 6 (six) hours as needed. Dx: 492.8 07/15/14 07/15/15 Yes Kathee Delton, MD  ALPRAZolam Duanne Moron) 0.25 MG tablet Take 1 tablet (0.25 mg total) by mouth at bedtime as needed for sleep. 12/03/14  Yes Venetia Maxon Rama, MD  aspirin 81 MG tablet Take 81 mg by mouth daily.     Yes Historical Provider, MD  atorvastatin (LIPITOR) 80 MG tablet Take 80 mg by mouth daily.     Yes Historical Provider, MD  BENICAR 40 MG tablet Take 40 mg by mouth daily.    Yes Historical Provider, MD  benzonatate (TESSALON) 100 MG capsule Take 1 capsule (100 mg total) by mouth 3 (three) times  daily as needed for cough. Patient taking differently: Take 100 mg by mouth 3 (three) times daily as needed for cough. Cough 07/15/14  Yes Kathee Delton, MD  budesonide-formoterol (SYMBICORT) 160-4.5 MCG/ACT inhaler INHALE 2 PUFFS INTO THE LUNGS 2 (TWO) TIMES DAILY. 12/23/14  Yes Kathee Delton, MD  Calcium Carbonate (CALTRATE 600) 1500 MG TABS Take 1 tablet by mouth 2 (two) times daily.     Yes Historical Provider, MD  Lutein 20 MG CAPS Take 20 mg by mouth daily.    Yes Historical Provider, MD  Multiple Vitamin (MULTIVITAMIN WITH MINERALS) TABS tablet Take 1 tablet by mouth daily.    Yes Historical Provider, MD  Omega-3 Fatty Acids (FISH OIL) 1000 MG CAPS Take 1,000 mg by mouth 2 (two) times daily.    Yes Historical Provider, MD  Polyethyl Glycol-Propyl Glycol (SYSTANE) 0.4-0.3 % SOLN Apply 1 drop to eye 2 (two) times daily as needed (for dryness).    Yes Historical Provider, MD  predniSONE (DELTASONE) 10 MG tablet 1 tab daily Patient taking differently: Take 10 mg by mouth daily with breakfast. 1 tab daily 12/23/14  Yes Kathee Delton, MD  tiotropium (SPIRIVA) 18 MCG inhalation capsule Place 1 capsule (18 mcg total) into inhaler and inhale daily. 12/23/14  Yes Kathee Delton, MD  albuterol (PROAIR HFA) 108 (90 BASE) MCG/ACT inhaler Inhale 2 puffs into the lungs every 6 (six) hours as needed for wheezing or shortness of breath. 12/03/14   Christina P Rama, MD   BP 155/63 mmHg  Pulse 99  Temp(Src) 98.2 F (36.8 C) (Oral)  Resp 24  SpO2 95% Physical Exam  Constitutional: She is oriented to person, place, and time. She appears well-developed and well-nourished.  HENT:  Head: Normocephalic and atraumatic.  Right Ear: External ear normal.  Left Ear: External ear normal.  Eyes: Conjunctivae and EOM are normal. Pupils are equal, round, and reactive to light.  Neck: Normal range of motion. Neck supple.  Cardiovascular: Normal rate, regular rhythm, normal heart sounds and intact distal pulses.   Pulmonary/Chest: Effort normal. She has decreased breath sounds. She has wheezes.  Abdominal: Soft. Bowel sounds are normal. There is no tenderness.  Musculoskeletal: Normal range of motion.  Neurological: She is alert and oriented to person, place, and time.  Skin: Skin is warm and dry.  Vitals reviewed.   ED Course  Procedures (including critical care time) Labs Review Labs Reviewed  BASIC METABOLIC PANEL - Abnormal; Notable for the following:    Chloride 94 (*)    CO2 41 (*)    Glucose, Bld 119 (*)    All other components within normal limits  BLOOD GAS, ARTERIAL -  Abnormal; Notable for the following:    pCO2 arterial 66.5 (*)    Bicarbonate 38.1 (*)    Acid-Base Excess 11.0 (*)    All other components within normal limits  CBC WITH DIFFERENTIAL/PLATELET - Abnormal; Notable for the following:    WBC 12.6 (*)    RBC 3.43 (*)    Hemoglobin 10.5 (*)    HCT 34.8 (*)    MCV 101.5 (*)    Neutrophils Relative % 80 (*)    Neutro Abs 10.0 (*)    All other components within normal limits  BLOOD GAS, ARTERIAL - Abnormal; Notable for the following:    pCO2 arterial 63.4 (*)    Bicarbonate 37.5 (*)    Acid-Base Excess 10.8 (*)    All other components within normal limits  BRAIN  NATRIURETIC PEPTIDE  I-STAT TROPOININ, ED    Imaging Review Dg Chest 2 View  03/14/2015   CLINICAL DATA:  Dyspnea, progressively worsening since yesterday morning.  EXAM: CHEST  2 VIEW  COMPARISON:  11/30/2014  FINDINGS: There is hyperinflation and upper lobe emphysematous change. There is chronic coarsening of the interstitium in the basilar periphery. There is no evidence of superimposed acute infiltrate or CHF. There is no pleural effusion. There is no pneumothorax. Hilar, mediastinal and cardiac contours are unremarkable and unchanged.  IMPRESSION: COPD.  No superimposed acute findings.   Electronically Signed   By: Andreas Newport M.D.   On: 03/14/2015 01:54     EKG Interpretation   Date/Time:  Sunday Mar 14 2015 01:26:27 EDT Ventricular Rate:  96 PR Interval:  133 QRS Duration: 70 QT Interval:  320 QTC Calculation: 404 R Axis:   50 Text Interpretation:  Sinus rhythm No significant change since last  tracing Confirmed by Debby Freiberg [phone]) on 03/14/2015 2:15:37 AM     CRITICAL CARE Performed by: Debby Freiberg   Total critical care time: 92 min  Critical care time was exclusive of separately billable procedures and treating other patients.  Critical care was necessary to treat or prevent imminent or life-threatening deterioration.  Critical care was time  spent personally by me on the following activities: development of treatment plan with patient and/or surrogate as well as nursing, discussions with consultants, evaluation of patient's response to treatment, examination of patient, obtaining history from patient or surrogate, ordering and performing treatments and interventions, ordering and review of laboratory studies, ordering and review of radiographic studies, pulse oximetry and re-evaluation of patient's condition.  MDM   Final diagnoses:  Acute respiratory distress  Acute exacerbation of chronic obstructive pulmonary disease (COPD)  Acute on chronic respiratory failure with hypercapnia    74 y.o. female with pertinent PMH of COPD on home O2 2L presents with recurrent acute on chronic dyspnea x 1 day.  No fevers or increase in productive cough.  On EMS arrival the patient was given DuoNeb, placed on cpap.  This improved the patient's symptoms.  On arrival to the ED the patient had vital signs and physical exam as above. Patient was mildly tachycardic, tachypneic. She was given one hour continuous albuterol and had some improvement, however still was tachypneic with respiratory distress. Second hour of continuous albuterol and history. Chest x-ray and workup as above with likely chronic respiratory failure, no pneumonia. Hypercarbia and tachypnea improved marginally after albuterol. Consulted medicine for admission.  I have reviewed all laboratory and imaging studies if ordered as above  1. Acute respiratory distress   2. Acute exacerbation of chronic obstructive pulmonary disease (COPD)   3. Acute on chronic respiratory failure with hypercapnia         Debby Freiberg, MD 03/14/15 [phone]

## 2015-03-13 NOTE — ED Notes (Signed)
Pt arrived to the ED from home via EMS.  Pt complains of shortness of breath progressively getting worse since yesterday morning.  Pt used her nebulizer once and her inhaler three times. Pt given a neb treatment by EMS.  Pt appears barrel chested but is able to vocalize complaint.  Pt was put on CPAP by EMS .  Upon arrival to the ED CPAP removed patient saturation rate on 2 L was 88% so a duo neb was ordered and RT is int he process of fulfilling said order.

## 2015-03-13 NOTE — ED Notes (Signed)
Bed: RESA Expected date:  Expected time:  Means of arrival:  Comments: EMS 73yo Resp Distress on CPAP

## 2015-03-14 ENCOUNTER — Emergency Department (HOSPITAL_COMMUNITY): Payer: Medicare Other

## 2015-03-14 ENCOUNTER — Encounter (HOSPITAL_COMMUNITY): Payer: Self-pay | Admitting: Internal Medicine

## 2015-03-14 DIAGNOSIS — J9622 Acute and chronic respiratory failure with hypercapnia: Secondary | ICD-10-CM | POA: Diagnosis not present

## 2015-03-14 DIAGNOSIS — F1010 Alcohol abuse, uncomplicated: Secondary | ICD-10-CM

## 2015-03-14 DIAGNOSIS — E785 Hyperlipidemia, unspecified: Secondary | ICD-10-CM

## 2015-03-14 DIAGNOSIS — J441 Chronic obstructive pulmonary disease with (acute) exacerbation: Secondary | ICD-10-CM | POA: Diagnosis present

## 2015-03-14 DIAGNOSIS — J9621 Acute and chronic respiratory failure with hypoxia: Secondary | ICD-10-CM | POA: Diagnosis present

## 2015-03-14 DIAGNOSIS — R0602 Shortness of breath: Secondary | ICD-10-CM | POA: Diagnosis present

## 2015-03-14 DIAGNOSIS — Z9981 Dependence on supplemental oxygen: Secondary | ICD-10-CM | POA: Diagnosis not present

## 2015-03-14 DIAGNOSIS — Z8543 Personal history of malignant neoplasm of ovary: Secondary | ICD-10-CM | POA: Diagnosis not present

## 2015-03-14 DIAGNOSIS — I10 Essential (primary) hypertension: Secondary | ICD-10-CM

## 2015-03-14 DIAGNOSIS — R0603 Acute respiratory distress: Secondary | ICD-10-CM | POA: Insufficient documentation

## 2015-03-14 DIAGNOSIS — Z8249 Family history of ischemic heart disease and other diseases of the circulatory system: Secondary | ICD-10-CM | POA: Diagnosis not present

## 2015-03-14 DIAGNOSIS — T380X5A Adverse effect of glucocorticoids and synthetic analogues, initial encounter: Secondary | ICD-10-CM | POA: Diagnosis present

## 2015-03-14 DIAGNOSIS — F419 Anxiety disorder, unspecified: Secondary | ICD-10-CM

## 2015-03-14 DIAGNOSIS — Z7982 Long term (current) use of aspirin: Secondary | ICD-10-CM | POA: Diagnosis not present

## 2015-03-14 DIAGNOSIS — Z681 Body mass index (BMI) 19 or less, adult: Secondary | ICD-10-CM | POA: Diagnosis not present

## 2015-03-14 DIAGNOSIS — E43 Unspecified severe protein-calorie malnutrition: Secondary | ICD-10-CM | POA: Diagnosis not present

## 2015-03-14 DIAGNOSIS — Z7952 Long term (current) use of systemic steroids: Secondary | ICD-10-CM | POA: Diagnosis not present

## 2015-03-14 DIAGNOSIS — D638 Anemia in other chronic diseases classified elsewhere: Secondary | ICD-10-CM | POA: Diagnosis present

## 2015-03-14 DIAGNOSIS — Z87891 Personal history of nicotine dependence: Secondary | ICD-10-CM | POA: Diagnosis not present

## 2015-03-14 DIAGNOSIS — M810 Age-related osteoporosis without current pathological fracture: Secondary | ICD-10-CM | POA: Diagnosis present

## 2015-03-14 LAB — CBC WITH DIFFERENTIAL/PLATELET
Basophils Absolute: 0 10*3/uL (ref 0.0–0.1)
Basophils Relative: 0 % (ref 0–1)
EOS ABS: 0.1 10*3/uL (ref 0.0–0.7)
Eosinophils Relative: 0 % (ref 0–5)
HCT: 34.8 % — ABNORMAL LOW (ref 36.0–46.0)
HEMOGLOBIN: 10.5 g/dL — AB (ref 12.0–15.0)
LYMPHS ABS: 1.8 10*3/uL (ref 0.7–4.0)
Lymphocytes Relative: 14 % (ref 12–46)
MCH: 30.6 pg (ref 26.0–34.0)
MCHC: 30.2 g/dL (ref 30.0–36.0)
MCV: 101.5 fL — ABNORMAL HIGH (ref 78.0–100.0)
MONOS PCT: 6 % (ref 3–12)
Monocytes Absolute: 0.8 10*3/uL (ref 0.1–1.0)
NEUTROS ABS: 10 10*3/uL — AB (ref 1.7–7.7)
Neutrophils Relative %: 80 % — ABNORMAL HIGH (ref 43–77)
Platelets: 199 10*3/uL (ref 150–400)
RBC: 3.43 MIL/uL — ABNORMAL LOW (ref 3.87–5.11)
RDW: 14 % (ref 11.5–15.5)
WBC: 12.6 10*3/uL — ABNORMAL HIGH (ref 4.0–10.5)

## 2015-03-14 LAB — BLOOD GAS, ARTERIAL
Acid-Base Excess: 11 mmol/L — ABNORMAL HIGH (ref 0.0–2.0)
Acid-Base Excess: 10.8 mmol/L — ABNORMAL HIGH (ref 0.0–2.0)
Bicarbonate: 38.1 mEq/L — ABNORMAL HIGH (ref 20.0–24.0)
Bicarbonate: 37.5 mEq/L — ABNORMAL HIGH (ref 20.0–24.0)
DRAWN BY: 235321
Drawn by: 308601
O2 CONTENT: 2 L/min
O2 Content: 2 L/min
O2 SAT: 96.5 %
O2 Saturation: 95.9 %
PH ART: 7.389 (ref 7.350–7.450)
PO2 ART: 93.1 mmHg (ref 80.0–100.0)
Patient temperature: 98.6
Patient temperature: 98.6
TCO2: 35.1 mmol/L (ref 0–100)
TCO2: 34.6 mmol/L (ref 0–100)
pCO2 arterial: 66.5 mmHg (ref 35.0–45.0)
pCO2 arterial: 63.4 mmHg (ref 35.0–45.0)
pH, Arterial: 7.376 (ref 7.350–7.450)
pO2, Arterial: 84.4 mmHg (ref 80.0–100.0)

## 2015-03-14 LAB — BASIC METABOLIC PANEL
Anion gap: 5 (ref 5–15)
BUN: 15 mg/dL (ref 6–20)
CO2: 41 mmol/L — ABNORMAL HIGH (ref 22–32)
CREATININE: 0.55 mg/dL (ref 0.44–1.00)
Calcium: 9.9 mg/dL (ref 8.9–10.3)
Chloride: 94 mmol/L — ABNORMAL LOW (ref 101–111)
Glucose, Bld: 119 mg/dL — ABNORMAL HIGH (ref 70–99)
POTASSIUM: 4.5 mmol/L (ref 3.5–5.1)
Sodium: 140 mmol/L (ref 135–145)

## 2015-03-14 LAB — BRAIN NATRIURETIC PEPTIDE: B Natriuretic Peptide: 33.6 pg/mL (ref 0.0–100.0)

## 2015-03-14 LAB — I-STAT TROPONIN, ED: TROPONIN I, POC: 0 ng/mL (ref 0.00–0.08)

## 2015-03-14 LAB — PROTIME-INR
INR: 0.87 (ref 0.00–1.49)
Prothrombin Time: 12 seconds (ref 11.6–15.2)

## 2015-03-14 LAB — STREP PNEUMONIAE URINARY ANTIGEN: Strep Pneumo Urinary Antigen: NEGATIVE

## 2015-03-14 MED ORDER — AZITHROMYCIN 500 MG PO TABS
500.0000 mg | ORAL_TABLET | Freq: Every day | ORAL | Status: AC
  Administered 2015-03-14: 500 mg via ORAL
  Filled 2015-03-14: qty 1

## 2015-03-14 MED ORDER — ADULT MULTIVITAMIN W/MINERALS CH
1.0000 | ORAL_TABLET | Freq: Every day | ORAL | Status: DC
  Filled 2015-03-14 (×3): qty 1

## 2015-03-14 MED ORDER — IPRATROPIUM-ALBUTEROL 0.5-2.5 (3) MG/3ML IN SOLN
3.0000 mL | RESPIRATORY_TRACT | Status: DC
  Administered 2015-03-14: 3 mL via RESPIRATORY_TRACT
  Filled 2015-03-14: qty 3

## 2015-03-14 MED ORDER — IPRATROPIUM-ALBUTEROL 0.5-2.5 (3) MG/3ML IN SOLN
3.0000 mL | Freq: Once | RESPIRATORY_TRACT | Status: AC
  Administered 2015-03-14: 3 mL via RESPIRATORY_TRACT
  Filled 2015-03-14: qty 3

## 2015-03-14 MED ORDER — LUTEIN 20 MG PO CAPS: 20.0000 mg | ORAL_CAPSULE | Freq: Every day | ORAL | Status: DC

## 2015-03-14 MED ORDER — IPRATROPIUM-ALBUTEROL 0.5-2.5 (3) MG/3ML IN SOLN
3.0000 mL | Freq: Four times a day (QID) | RESPIRATORY_TRACT | Status: DC
  Administered 2015-03-14 – 2015-03-15 (×4): 3 mL via RESPIRATORY_TRACT
  Filled 2015-03-14 (×4): qty 3

## 2015-03-14 MED ORDER — HEPARIN SODIUM (PORCINE) 5000 UNIT/ML IJ SOLN
5000.0000 [IU] | Freq: Three times a day (TID) | INTRAMUSCULAR | Status: DC
  Administered 2015-03-14 – 2015-03-16 (×7): 5000 [IU] via SUBCUTANEOUS
  Filled 2015-03-14 (×7): qty 1

## 2015-03-14 MED ORDER — POLYVINYL ALCOHOL 1.4 % OP SOLN
1.0000 [drp] | Freq: Two times a day (BID) | OPHTHALMIC | Status: DC | PRN
  Filled 2015-03-14: qty 15

## 2015-03-14 MED ORDER — METHYLPREDNISOLONE SODIUM SUCC 125 MG IJ SOLR
125.0000 mg | Freq: Once | INTRAMUSCULAR | Status: AC
  Administered 2015-03-14: 125 mg via INTRAVENOUS
  Filled 2015-03-14: qty 2

## 2015-03-14 MED ORDER — ALBUTEROL SULFATE (2.5 MG/3ML) 0.083% IN NEBU: 2.5000 mg | INHALATION_SOLUTION | RESPIRATORY_TRACT | Status: DC | PRN

## 2015-03-14 MED ORDER — METHYLPREDNISOLONE SODIUM SUCC 125 MG IJ SOLR
60.0000 mg | Freq: Three times a day (TID) | INTRAMUSCULAR | Status: AC
Start: 2015-03-14 — End: 2015-03-15
  Administered 2015-03-14 – 2015-03-15 (×5): 60 mg via INTRAVENOUS
  Filled 2015-03-14 (×5): qty 2

## 2015-03-14 MED ORDER — IRBESARTAN 150 MG PO TABS
300.0000 mg | ORAL_TABLET | Freq: Every day | ORAL | Status: DC
  Administered 2015-03-14 – 2015-03-16 (×3): 300 mg via ORAL
  Filled 2015-03-14 (×3): qty 2

## 2015-03-14 MED ORDER — PROSIGHT PO TABS
1.0000 | ORAL_TABLET | Freq: Every day | ORAL | Status: DC
  Filled 2015-03-14 (×3): qty 1

## 2015-03-14 MED ORDER — ASPIRIN 81 MG PO CHEW
81.0000 mg | CHEWABLE_TABLET | Freq: Every day | ORAL | Status: DC
  Administered 2015-03-14 – 2015-03-16 (×3): 81 mg via ORAL
  Filled 2015-03-14 (×3): qty 1

## 2015-03-14 MED ORDER — BENZONATATE 100 MG PO CAPS
100.0000 mg | ORAL_CAPSULE | Freq: Three times a day (TID) | ORAL | Status: DC | PRN
  Administered 2015-03-15: 100 mg via ORAL
  Filled 2015-03-14: qty 1

## 2015-03-14 MED ORDER — ENSURE ENLIVE PO LIQD
237.0000 mL | Freq: Two times a day (BID) | ORAL | Status: DC
  Administered 2015-03-14 – 2015-03-16 (×4): 237 mL via ORAL

## 2015-03-14 MED ORDER — OMEGA-3-ACID ETHYL ESTERS 1 G PO CAPS
1.0000 g | ORAL_CAPSULE | Freq: Every day | ORAL | Status: DC
  Filled 2015-03-14 (×3): qty 1

## 2015-03-14 MED ORDER — CALCIUM CARBONATE 1250 (500 CA) MG PO TABS
1250.0000 mg | ORAL_TABLET | Freq: Two times a day (BID) | ORAL | Status: DC
  Administered 2015-03-15: 1250 mg via ORAL
  Filled 2015-03-14 (×4): qty 3

## 2015-03-14 MED ORDER — ONDANSETRON 4 MG PO TBDP
4.0000 mg | ORAL_TABLET | Freq: Three times a day (TID) | ORAL | Status: DC | PRN
  Administered 2015-03-14: 4 mg via ORAL
  Filled 2015-03-14: qty 1

## 2015-03-14 MED ORDER — ATORVASTATIN CALCIUM 40 MG PO TABS
80.0000 mg | ORAL_TABLET | Freq: Every day | ORAL | Status: DC
  Administered 2015-03-14 – 2015-03-16 (×3): 80 mg via ORAL
  Filled 2015-03-14 (×3): qty 2

## 2015-03-14 MED ORDER — AZITHROMYCIN 250 MG PO TABS
250.0000 mg | ORAL_TABLET | Freq: Every day | ORAL | Status: DC
  Administered 2015-03-15 – 2015-03-16 (×2): 250 mg via ORAL
  Filled 2015-03-14 (×2): qty 1

## 2015-03-14 MED ORDER — POLYETHYL GLYCOL-PROPYL GLYCOL 0.4-0.3 % OP SOLN: 1.0000 [drp] | Freq: Two times a day (BID) | OPHTHALMIC | Status: DC | PRN

## 2015-03-14 MED ORDER — ALPRAZOLAM 0.25 MG PO TABS
0.2500 mg | ORAL_TABLET | Freq: Every evening | ORAL | Status: DC | PRN
  Administered 2015-03-14 – 2015-03-15 (×2): 0.25 mg via ORAL
  Filled 2015-03-14 (×2): qty 1

## 2015-03-14 MED ORDER — ALBUTEROL (5 MG/ML) CONTINUOUS INHALATION SOLN
10.0000 mg/h | INHALATION_SOLUTION | RESPIRATORY_TRACT | Status: DC
  Administered 2015-03-14: 10 mg/h via RESPIRATORY_TRACT

## 2015-03-14 NOTE — H&P (Signed)
[facility] History and Physical  NIKESHA KWASNY ZES:[id number] DOB: May 23, 1941 DOA: 03/13/2015  Referring physician: ED physician PCP: Gennette Pac, MD  Specialists:   Chief Complaint: Shortness of breath  HPI: [name] is a 74 y.o. female with PMH of COPD on 2 L home oxygen, hypertension, hyperlipidemia, former smoker, alcohol abuse, anxiety, who presents with shortness of breath.  Patient reports that in the past 2 days, she has an worsening shortness of breath. She has very minimal cough, no chest pain. She has chronic sinus drainage which has not changed recently. No sore throat. No sick contacts.  Currently patient denies fever, chills, ear pain, headaches, chest pain, abdominal pain, diarrhea, constipation, dysuria, urgency, frequency, hematuria, skin rashes, or leg swelling. No unilateral weakness, numbness or tingling sensations. No vision change or hearing loss.  In ED, patient was found to have WBC 12.6 (patient is on prednisone at home), tachycardia, temperature normal, BNP 33.6, Electrolytes okay. Chest x-ray showed COPD, but no infiltration. Patient is admitted to inpatient for further evaluation and treatment.   Where does patient live?      At home    Can patient participate in ADLs? Yes     Review of Systems:   General: no fevers, chills, no changes in body weight, poor appetite, has fatigure HEENT: no blurry vision, hearing changes or sore throat Pulm: has dyspnea, coughing wheezing CV: no chest pain, palpitations, has shortness of breath Abd: no nausea, vomiting,abdominal pain, diarrhea, constipation GU: no dysuria, hematuria, polyuria Ext: no leg edema Neuro: no unilateral weakness, numbness, or tingling Skin: no rash MSK: No muscle spasm, no deformity, no limitation of range of movement in spin Heme: No easy bruising.  Travel history: No recent long distant travel.  Allergy:  Allergies  Allergen Reactions  . Oxycodone-Acetaminophen Nausea  Only and Other (See Comments)    Headaches     Past Medical History  Diagnosis Date  . Emphysema   . 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 Hyperlipidemia   . Hypertension   . Blood transfusion without reported diagnosis   . Emphysema of lung   . Osteoporosis   . SBO (small bowel obstruction) s/p LOA 11/07/2012 11/04/2012  . COPD (chronic obstructive pulmonary disease) with emphysema 11/05/2012    [facility] 2001:  FEV1 0.50, ratio 28 Pulmonary rehab 2013   . Chronic respiratory failure 07/15/2014  . Alcohol abuse     Past Surgical History  Procedure Laterality Date  . Abdominal hysterectomy    . Laparoscopy  11/07/2012    Procedure: LAPAROSCOPY DIAGNOSTIC;  Surgeon: Ralene Ok, MD;  Location: [facility];  Service: General;  Laterality: N/A;  . Laparotomy  11/07/2012    Procedure: EXPLORATORY LAPAROTOMY;  Surgeon: Ralene Ok, MD;  Location: [facility];  Service: General;  Laterality: N/A;  . Laparoscopic lysis of adhesions  11/07/2012    Procedure: LAPAROSCOPIC LYSIS OF ADHESIONS;  Surgeon: Ralene Ok, MD;  Location: [facility];  Service: General;  Laterality: N/A;  . Lysis of adhesion  11/07/2012    Procedure: LYSIS OF ADHESION;  Surgeon: Ralene Ok, MD;  Location: [facility];  Service: General;;    Social History:  reports that she quit smoking about 22 years ago. Her smoking use included Cigarettes. She has a 30 pack-year smoking history. She has never used smokeless tobacco. She reports that she drinks about 0.6 oz of alcohol per week. She reports that she does not use illicit drugs.  Family History:  Family History  Problem  Relation Age of Onset  . Heart failure Mother   . Heart attack Father   . Heart attack Paternal Grandmother      Prior to Admission medications   Medication Sig Start Date End Date Taking? Authorizing Provider  albuterol (PROVENTIL) (2.5 MG/3ML) 0.083% nebulizer solution Take 3 mLs (2.5 mg total) by nebulization every 6 (six) hours as needed. Dx: 492.8 07/15/14 07/15/15 Yes  Kathee Delton, MD  ALPRAZolam Duanne Moron) 0.25 MG tablet Take 1 tablet (0.25 mg total) by mouth at bedtime as needed for sleep. 12/03/14  Yes Venetia Maxon Rama, MD  aspirin 81 MG tablet Take 81 mg by mouth daily.     Yes Historical Provider, MD  atorvastatin (LIPITOR) 80 MG tablet Take 80 mg by mouth daily.     Yes Historical Provider, MD  BENICAR 40 MG tablet Take 40 mg by mouth daily.    Yes Historical Provider, MD  benzonatate (TESSALON) 100 MG capsule Take 1 capsule (100 mg total) by mouth 3 (three) times daily as needed for cough. Patient taking differently: Take 100 mg by mouth 3 (three) times daily as needed for cough. Cough 07/15/14  Yes Kathee Delton, MD  budesonide-formoterol (SYMBICORT) 160-4.5 MCG/ACT inhaler INHALE 2 PUFFS INTO THE LUNGS 2 (TWO) TIMES DAILY. 12/23/14  Yes Kathee Delton, MD  Calcium Carbonate (CALTRATE 600) 1500 MG TABS Take 1 tablet by mouth 2 (two) times daily.     Yes Historical Provider, MD  Lutein 20 MG CAPS Take 20 mg by mouth daily.    Yes Historical Provider, MD  Multiple Vitamin (MULTIVITAMIN WITH MINERALS) TABS tablet Take 1 tablet by mouth daily.   Yes Historical Provider, MD  Omega-3 Fatty Acids (FISH OIL) 1000 MG CAPS Take 1,000 mg by mouth 2 (two) times daily.    Yes Historical Provider, MD  Polyethyl Glycol-Propyl Glycol (SYSTANE) 0.4-0.3 % SOLN Apply 1 drop to eye 2 (two) times daily as needed (for dryness).    Yes Historical Provider, MD  predniSONE (DELTASONE) 10 MG tablet 1 tab daily Patient taking differently: Take 10 mg by mouth daily with breakfast. 1 tab daily 12/23/14  Yes Kathee Delton, MD  tiotropium (SPIRIVA) 18 MCG inhalation capsule Place 1 capsule (18 mcg total) into inhaler and inhale daily. 12/23/14  Yes Kathee Delton, MD  albuterol (PROAIR HFA) 108 (90 BASE) MCG/ACT inhaler Inhale 2 puffs into the lungs every 6 (six) hours as needed for wheezing or shortness of breath. 12/03/14   Venetia Maxon Rama, MD    Physical Exam: Filed Vitals:    03/13/15 2349 03/14/15 0221 03/14/15 0419 03/14/15 0533  BP:    155/73  Pulse:  99  108  Temp:    98.6 F (37 C)  TempSrc:    Oral  Resp:  24  26  Height:    5\' 1"  (1.549 m)  Weight:    40.597 kg (89 lb 8 oz)  SpO2: 99% 98% 95% 98%   General: Not in acute distress HEENT:       Eyes: PERRL, EOMI, no scleral icterus       ENT: No discharge from the ears and nose, no pharynx injection, no tonsillar enlargement.        Neck: No JVD, no bruit, no mass felt. Heme: No neck or axillary lymph node enlargement Cardiac: S1/S2, RRR, No murmurs, No gallops or rubs Pulm: Decreased air movement bilaterally, has fine crackles, mild wheezing, No rhonchi or rubs. Abd: Soft, nondistended, nontender, no  rebound pain, no organomegaly, BS present Ext: No edema bilaterally. 2+DP/PT pulse bilaterally Musculoskeletal: No joint deformities, erythema, or stiffness, ROM full Skin: No rashes.  Neuro: Alert, oriented X3, cranial nerves II-XII grossly intact, muscle strength 5/5 in all extremities, sensation to light touch intact.  Psych: Patient is not psychotic, no suicidal or hemocidal ideation.  Labs on Admission:  Basic Metabolic Panel:  Recent Labs Lab 03/14/15 0117  NA 140  K 4.5  CL 94*  CO2 41*  GLUCOSE 119*  BUN 15  CREATININE 0.55  CALCIUM 9.9   Liver Function Tests: No results for input(s): AST, ALT, ALKPHOS, BILITOT, PROT, ALBUMIN in the last 168 hours. No results for input(s): LIPASE, AMYLASE in the last 168 hours. No results for input(s): AMMONIA in the last 168 hours. CBC:  Recent Labs Lab 03/14/15 0308  WBC 12.6*  NEUTROABS 10.0*  HGB 10.5*  HCT 34.8*  MCV 101.5*  PLT 199   Cardiac Enzymes: No results for input(s): CKTOTAL, CKMB, CKMBINDEX, TROPONINI in the last 168 hours.  BNP (last 3 results)  Recent Labs  11/30/14 0852 03/14/15 0118  BNP 33.5 33.6    ProBNP (last 3 results) No results for input(s): PROBNP in the last 8760 hours.  CBG: No results for  input(s): GLUCAP in the last 168 hours.  Radiological Exams on Admission: Dg Chest 2 View  03/14/2015   CLINICAL DATA:  Dyspnea, progressively worsening since yesterday morning.  EXAM: CHEST  2 VIEW  COMPARISON:  11/30/2014  FINDINGS: There is hyperinflation and upper lobe emphysematous change. There is chronic coarsening of the interstitium in the basilar periphery. There is no evidence of superimposed acute infiltrate or CHF. There is no pleural effusion. There is no pneumothorax. Hilar, mediastinal and cardiac contours are unremarkable and unchanged.  IMPRESSION: COPD.  No superimposed acute findings.   Electronically Signed   By: Andreas Newport M.D.   On: 03/14/2015 01:54    EKG: Independently reviewed.  Abnormal findings:   Tachycardia  Assessment/Plan Principal Problem:   COPD with exacerbation Active Problems:   Anxiety   HTN (hypertension)   HLD (hyperlipidemia)   Anemia of chronic disease   Acute on chronic respiratory failure   Alcohol abuse   COPD exacerbation  COPD exacerbation and acute on chronic respiratory failure: Patient's presentation are most consistent with COPD exacerbation. BNP 33.6, unikely to have CHF. No chest pain, less likely to have pulmonary embolism. No pneumonia on chest x-ray. Patient is not septic on admission. Her leukocytosis is likely secondary to prednisone use.  -will admit patient to telemetry bed  -Nebulizers: scheduled Duoneb and prn albuterol -Solu-Medrol 60 mg IV tid -Oral azithromycin for 5 days.  -Tessalon for cough  -Blood and sputum culture, respiratory viral panel -Urine legionella and S. pneumococcal antigen -Follow up blood culture x2, sputum culture, respiratory virus panel  Hypertension: -switch to Benicar to irbesartan in hospital  Hyperlipidemia: No LDL on record. -Continue Lipitor -Check FLP  Anxiety: Stable. No suicidal or homicidal ideations. The -Continue when necessary Xanax  Alcohol abuse: -Did counseling about  the importance of quitting drinking -CIWA protocol  DVT ppx: SQ Heparin    Code Status: Full code Family Communication: None at bed side.       Disposition Plan: Admit to inpatient   Date of Service 03/14/2015    Ivor Costa [facility] Pager [phone]  If 7PM-7AM, please contact night-coverage [email or web address] Password [facility] 03/14/2015, 6:33 AM

## 2015-03-14 NOTE — Progress Notes (Signed)
  PROGRESS NOTE  [name]:[id number] DOB: Apr 01, 1941 DOA: 03/13/2015 PCP: Gennette Pac, MD  Summary: 66yow PMH COPD/home oxygen presented with increasing SOB. Admitted for COPD exacerbation.  Assessment/Plan: 1. Acute COPD exacerbation with acute on chronic hypoxic respiratory failure. No improvement yet. 2. COPD, chronic hypercapnic and hypoxic respiratory failure on 2L [place]. SPO2 stable on 2 L. 3. Normocytic anemia, stable. 4. Tobacco dependence in remission 5. Alcohol use.    Still fairly short of breath. Plan to continue antibiotics, steroids, bronchodilators, oxygen without change today.  Anticipate will need another 2 days in the hospital  Code Status: full code DVT prophylaxis: heparin Family Communication: none present; pt alert and has capacity, discussed plan with patient Disposition Plan: home  Murray Hodgkins, MD  [facility]  Pager [phone] If 7PM-7AM, please contact night-coverage at [email or web address], password [facility] 03/14/2015, 8:12 AM  LOS: 0 days   Consultants:    Procedures:    Antibiotics:  Azithromycin 5/8 >>   HPI/Subjective: No real improvement yet. Still short of breath.  Objective: Filed Vitals:   03/13/15 2349 03/14/15 0221 03/14/15 0419 03/14/15 0533  BP:    155/73  Pulse:  99  108  Temp:    98.6 F (37 C)  TempSrc:    Oral  Resp:  24  26  Height:    5\' 1"  (1.549 m)  Weight:    40.597 kg (89 lb 8 oz)  SpO2: 99% 98% 95% 98%   No intake or output data in the 24 hours ending 03/14/15 0812   Filed Weights   03/14/15 0533  Weight: 40.597 kg (89 lb 8 oz)    Exam:     Afebrile, VSS, SpO2 98% on 2L General:  Appears calm, mildly uncomfortable. Nontoxic. Cardiovascular: RRR, no m/r/g.  Telemetry: SR/ST, no arrhythmias  Respiratory: Pursed lip breathing. Speaks in full sentences. Moderate increased respiratory effort. Bilateral wheezes. No rhonchi or rales. Psychiatric: grossly normal mood and affect, speech fluent  and appropriate  New data reviewed:  ABG reviewed, consistent with history of chronic hypercapnic respiratory failure with metabolic compensation.  Basic metabolic panel unremarkable  CBC with elevated WBC on steroids. Hemoglobin 10.5.  Pertinent data since admission:  Chest x-ray: COPD. No acute findings.  EKG sinus rhythm. No acute changes.  Pending data:  Blood cultures  Respiratory virus panel  Scheduled Meds: . aspirin  81 mg Oral Daily  . atorvastatin  80 mg Oral Daily  . azithromycin  500 mg Oral Daily   Followed by  . [START ON 03/15/2015] azithromycin  250 mg Oral Daily  . calcium carbonate  1,250 mg Oral BID WC  . feeding supplement (ENSURE ENLIVE)  237 mL Oral BID BM  . heparin  5,000 Units Subcutaneous 3 times per day  . ipratropium-albuterol  3 mL Nebulization Q4H  . irbesartan  300 mg Oral Daily  . methylPREDNISolone (SOLU-MEDROL) injection  60 mg Intravenous Q8H  . multivitamin  1 tablet Oral Daily  . multivitamin with minerals  1 tablet Oral Daily  . omega-3 acid ethyl esters  1 g Oral Daily   Continuous Infusions:   Principal Problem:   COPD with exacerbation Active Problems:   Anxiety   HTN (hypertension)   Anemia of chronic disease   Acute on chronic respiratory failure   Alcohol abuse   Time spent 20 minutes

## 2015-03-15 DIAGNOSIS — E43 Unspecified severe protein-calorie malnutrition: Secondary | ICD-10-CM

## 2015-03-15 LAB — LEGIONELLA ANTIGEN, URINE

## 2015-03-15 MED ORDER — PREDNISONE 20 MG PO TABS
40.0000 mg | ORAL_TABLET | Freq: Every day | ORAL | Status: DC
  Administered 2015-03-16: 40 mg via ORAL
  Filled 2015-03-15: qty 2

## 2015-03-15 MED ORDER — POLYETHYLENE GLYCOL 3350 17 G PO PACK
17.0000 g | PACK | Freq: Two times a day (BID) | ORAL | Status: DC
  Administered 2015-03-15 – 2015-03-16 (×2): 17 g via ORAL
  Filled 2015-03-15 (×2): qty 1

## 2015-03-15 MED ORDER — PRO-STAT SUGAR FREE PO LIQD
30.0000 mL | Freq: Two times a day (BID) | ORAL | Status: DC
  Administered 2015-03-15 – 2015-03-16 (×3): 30 mL via ORAL
  Filled 2015-03-15 (×7): qty 30

## 2015-03-15 MED ORDER — IPRATROPIUM-ALBUTEROL 0.5-2.5 (3) MG/3ML IN SOLN
3.0000 mL | Freq: Two times a day (BID) | RESPIRATORY_TRACT | Status: DC
  Administered 2015-03-15 – 2015-03-16 (×2): 3 mL via RESPIRATORY_TRACT
  Filled 2015-03-15 (×2): qty 3

## 2015-03-15 NOTE — Progress Notes (Signed)
OT Cancellation Note  Patient Details Name: [name] MRN: [id number] DOB: [birth year]   Cancelled Treatment:    Reason Eval/Treat Not Completed: Other (comment) Pt declined to participate with OT stating that she planned to D/C home and would be fine. OT signing off.  [place], OTR/L  [phone] 03/15/2015 03/15/2015, 2:07 PM

## 2015-03-15 NOTE — Evaluation (Signed)
Physical Therapy Evaluation Patient Details Name: [name] MRN: [id number] DOB: 12/26/1940 Today's Date: 03/15/2015   History of Present Illness  74 yo female admitted with COPD exac. Hx of emphysema, COPD, ov cancer, HTn, osteoporosis.   Clinical Impression  On eval, pt was Min guard assist for mobility-able to walk ~65 feet (longest distance before requiring seated rest break). Dyspnea 3-4/4 with ambulation. Remained on 2L O2. VCs for pursed lip breathing and seated rest breaks as needed-multiple seated rest breaks needed during brief session. Recommend HHPT for education on energy conservation and for general strength and conditioning in home environment. Encouraged pt to ambulate as able/tolerated, with nursing supervision, in addition to working with PT during hospital stay.     Follow Up Recommendations Home health PT; Intermittent supervision/assist    Equipment Recommendations  None recommended by PT    Recommendations for Other Services OT consult     Precautions / Restrictions Precautions Precautions: None Restrictions Weight Bearing Restrictions: No      Mobility  Bed Mobility Overal bed mobility: Modified Independent                Transfers Overall transfer level: Modified independent                  Ambulation/Gait Ambulation/Gait assistance: Min guard Ambulation Distance (Feet): 65 Feet (35'x1, 65'x1, 35'x1) Assistive device: None Gait Pattern/deviations: Step-through pattern;Decreased stride length     General Gait Details: Multiple seated rest breaks needed during session. Dyspnea 3-4/4 with ambulation. Remained on 2L O2.   Stairs            Wheelchair Mobility    Modified Rankin (Stroke Patients Only)       Balance                                             Pertinent Vitals/Pain Pain Assessment: No/denies pain End of session: 92% on 2L O2, HR 113 bpm, dyspnea 2-3/4    Home Living  Family/patient expects to be discharged to:: Private residence Living Arrangements: Alone Available Help at Discharge: Friend(s);Available PRN/intermittently Type of Home:  (townhome) Home Access: Stairs to enter   [employer] of Steps: 1 Home Layout: One level Home Equipment: None      Prior Function Level of Independence: Independent         Comments: Did not use assistive device. Pt states she receives help from friends in shifts.      Hand Dominance        Extremity/Trunk Assessment   Upper Extremity Assessment: Defer to OT evaluation           Lower Extremity Assessment: Generalized weakness         Communication   Communication: No difficulties  Cognition Arousal/Alertness: Awake/alert Behavior During Therapy: WFL for tasks assessed/performed Overall Cognitive Status: Within Functional Limits for tasks assessed                      General Comments      Exercises        Assessment/Plan    PT Assessment Patient needs continued PT services  PT Diagnosis Difficulty walking;Generalized weakness   PT Problem List Decreased activity tolerance;Decreased mobility;Cardiopulmonary status limiting activity  PT Treatment Interventions Gait training;Functional mobility training;Therapeutic activities;Therapeutic exercise;Patient/family education   PT Goals (Current goals can be found in the  Care Plan section) Acute Rehab PT Goals Patient Stated Goal: to be able to return home but with more help PT Goal Formulation: With patient Time For Goal Achievement: 03/29/15 Potential to Achieve Goals: Fair    Frequency Min 3X/week   Barriers to discharge        Co-evaluation               End of Session Equipment Utilized During Treatment: Gait belt;Oxygen Activity Tolerance: Patient limited by fatigue (Limited by dyspnea) Patient left: in bed;with call bell/phone within reach           Time: 5790-3833 PT Time Calculation  (min) (ACUTE ONLY): 16 min   Charges:   PT Evaluation $Initial PT Evaluation Tier I: 1 Procedure     PT G Codes:        [name], MPT Pager: [phone]

## 2015-03-15 NOTE — Progress Notes (Addendum)
PROGRESS NOTE  LAILANI TOOL HAL:[id number] DOB: 16-Aug-1941 DOA: 03/13/2015 PCP: Gennette Pac, MD  Summary: 3yow PMH COPD/home oxygen presented with increasing SOB. Admitted for COPD exacerbation.  Assessment/Plan: 1. Acute COPD exacerbation with acute on chronic hypoxic respiratory failure. Improving. 2. COPD, chronic hypercapnic and hypoxic respiratory failure on 2L [place]. Remains SPO2 stable on 2 L. 3. Normocytic anemia. 4. Tobacco dependence in remission 5. Alcohol use. No evidence of complication. 6. Severe malnutrition.   Definitely starting to improve. Plan to decrease steroids, change to oral therapy in the morning. continue supplemental oxygen, bronchodilators and antibiotics.  Likely discharge 5/10.  Code Status: full code DVT prophylaxis: heparin Family Communication: none present; pt alert and has capacity, discussed plan with patient again 5/9 Disposition Plan: home  Murray Hodgkins, MD  [facility]  Pager [phone] If 7PM-7AM, please contact night-coverage at [email or web address], password [facility] 03/15/2015, 3:15 PM  LOS: 1 day   Consultants:    Procedures:    Antibiotics:  Azithromycin 5/8 >>   HPI/Subjective: Breathing better today. No new issues  Objective: Filed Vitals:   03/14/15 2121 03/15/15 0144 03/15/15 0550 03/15/15 0735  BP: 137/70  136/72   Pulse: 93  80   Temp: 98.3 F (36.8 C)  98 F (36.7 C)   TempSrc: Oral  Oral   Resp: 20  18   Height:      Weight:      SpO2: 98% 97% 100% 97%    Intake/Output Summary (Last 24 hours) at 03/15/15 1515 Last data filed at 03/15/15 1200  Gross per 24 hour  Intake    480 ml  Output   1200 ml  Net   -720 ml     Filed Weights   03/14/15 0533  Weight: 40.597 kg (89 lb 8 oz)    Exam:     Afebrile, VSS, SpO2 97% on 2L. General:  Appears calm and comfortable Cardiovascular: RRR, no m/r/g. No LE edema. Respiratory: Fair air movement. No rhonchi or rales. Faint wheezes. Decreased  respiratory effort. Able to speak in full sentences. Psychiatric: grossly normal mood and affect, speech fluent and appropriate  New data reviewed:  Legionella and strep antigens negative.   Pertinent data since admission:  Chest x-ray: COPD. No acute findings.  EKG sinus rhythm. No acute changes.  Pending data:  Blood cultures no growth today   Respiratory virus panel  Scheduled Meds: . aspirin  81 mg Oral Daily  . atorvastatin  80 mg Oral Daily  . azithromycin  250 mg Oral Daily  . calcium carbonate  1,250 mg Oral BID WC  . feeding supplement (ENSURE ENLIVE)  237 mL Oral BID BM  . feeding supplement (PRO-STAT SUGAR FREE 64)  30 mL Oral BID  . heparin  5,000 Units Subcutaneous 3 times per day  . ipratropium-albuterol  3 mL Nebulization BID  . irbesartan  300 mg Oral Daily  . methylPREDNISolone (SOLU-MEDROL) injection  60 mg Intravenous Q8H  . multivitamin  1 tablet Oral Daily  . multivitamin with minerals  1 tablet Oral Daily  . omega-3 acid ethyl esters  1 g Oral Daily  . polyethylene glycol  17 g Oral BID  . [START ON 03/16/2015] predniSONE  40 mg Oral Q breakfast   Continuous Infusions:   Principal Problem:   COPD with exacerbation Active Problems:   Anxiety   HTN (hypertension)   Anemia of chronic disease   Acute on chronic respiratory failure   Alcohol abuse   Protein-calorie malnutrition,  severe   Time spent 20 minutes

## 2015-03-15 NOTE — Progress Notes (Signed)
Initial Nutrition Assessment Pt meets criteria for severe malnutrition in the context of chronic illness as evidenced by severe fat and muscle mass depletion  DOCUMENTATION CODES:  Underweight, Severe malnutrition in context of chronic illness  INTERVENTION:  Ensure Enlive (each supplement provides 350kcal and 20 grams of protein), Prostat BID  NUTRITION DIAGNOSIS:  Malnutrition related to chronic illness as evidenced by severe depletion of muscle mass, severe depletion of body fat.  GOAL:  Patient will meet greater than or equal to 90% of their needs  MONITOR:  PO intake, Supplement acceptance, Labs, I & O's, Weight trends  REASON FOR ASSESSMENT:  Malnutrition Screening Tool    ASSESSMENT: Pt is a 74 y.o. female with PMH of COPD on 2 L home oxygen, hypertension, hyperlipidemia, former smoker, alcohol abuse, anxiety, who presents with shortness of breath.  Pt states her usual weight is between 90 and 95 Lb, she hasn't weighed over 100 Lb since her youth, however she admits her weight being in the 80's is concerning. Per pt she has had poor appetite lately due to SOB, but was never a big eater. Pt states she usually eats large dinner, never ate breakfast and may eat a half of sandwich for late lunch. She does not snack. Pt does drink Boost at home, but suggested trying the new Ensure Enlive while here for higher protein, pt agreed. Pt also tried Prostat during her last admission and agreed to take it as well. RD to order.  Per nursing notes, pt has been consuming 10-75% of her meals and refused one meal. Emphasized importance of good PO for healing. Pt grateful for the visit. Labs reviewed: Glu 119    Height:  Ht Readings from Last 1 Encounters:  03/14/15 5\' 1"  (1.549 m)    Weight:  Wt Readings from Last 1 Encounters:  03/14/15 89 lb 8 oz (40.597 kg)    Ideal Body Weight:  47.73 kg  Wt Readings from Last 10 Encounters:  03/14/15 89 lb 8 oz (40.597 kg)  01/04/15 87 lb  12.8 oz (39.826 kg)  12/23/14 86 lb 9.6 oz (39.282 kg)  12/09/14 90 lb (40.824 kg)  11/30/14 93 lb (42.185 kg)  11/11/14 93 lb (42.185 kg)  07/15/14 95 lb 9.6 oz (43.364 kg)  01/12/14 97 lb 6.4 oz (44.18 kg)  10/19/13 97 lb (43.999 kg)  07/15/13 96 lb 9.6 oz (43.817 kg)    BMI:  Body mass index is 16.92 kg/(m^2).  Estimated Nutritional Needs:  Kcal:  1100 - 1300  Protein:  60 - 75 g  Fluid:  > 1.2 L  Skin:  Reviewed, no issues (ecchymosis)  Diet Order:  Diet Heart Room service appropriate?: Yes; Fluid consistency:: Thin  EDUCATION NEEDS:  No education needs identified at this time   Intake/Output Summary (Last 24 hours) at 03/15/15 1453 Last data filed at 03/15/15 1200  Gross per 24 hour  Intake    480 ml  Output   1200 ml  Net   -720 ml    Last BM:  5/07  Avrian Delfavero A. [place] Dietetic Intern Pager: [phone] 03/15/2015 2:53 PM

## 2015-03-16 ENCOUNTER — Encounter (HOSPITAL_COMMUNITY): Payer: Self-pay | Admitting: Family Medicine

## 2015-03-16 MED ORDER — PREDNISONE 10 MG PO TABS: ORAL_TABLET | ORAL | Status: DC

## 2015-03-16 MED ORDER — PRO-STAT SUGAR FREE PO LIQD: 30.0000 mL | Freq: Two times a day (BID) | ORAL | Status: DC

## 2015-03-16 MED ORDER — AZITHROMYCIN 250 MG PO TABS: 250.0000 mg | ORAL_TABLET | Freq: Every day | ORAL | Status: DC

## 2015-03-16 MED ORDER — ALBUTEROL SULFATE (2.5 MG/3ML) 0.083% IN NEBU: 2.5000 mg | INHALATION_SOLUTION | Freq: Four times a day (QID) | RESPIRATORY_TRACT | Status: DC | PRN

## 2015-03-16 MED ORDER — BENZONATATE 100 MG PO CAPS: 100.0000 mg | ORAL_CAPSULE | Freq: Three times a day (TID) | ORAL | Status: DC | PRN

## 2015-03-16 NOTE — Progress Notes (Signed)
Patient discharged home with friends, discharge instructions given and explained to patient and she verbalized understanding, denies any pain/distress. Skin intact, no wound noted but bruises on arms. Accompanied home by friend, transported to the car by staff.

## 2015-03-16 NOTE — Progress Notes (Addendum)
  PROGRESS NOTE  [name] DTO:[id number] DOB: December 11, 1940 DOA: 03/13/2015 PCP: Gennette Pac, MD  Dr. Gwenette Greet  Summary: 29yow PMH COPD/home oxygen presented with increasing SOB. Admitted for COPD exacerbation.  Assessment/Plan: 1. Acute COPD exacerbation with acute on chronic hypoxic respiratory failure. Acute component resolved.  2. COPD, chronic hypercapnic and hypoxic respiratory failure on 2L [place]. Stable on chronic oxygen. 3. Normocytic anemia. 4. Tobacco dependence in remission 5. Alcohol use. No evidence of complication. Denies abuse. Drinks 1 glass of wine at night. 6. Severe malnutrition.   Doing well.  Home today with prednisone taper, finish azithromycin  F/u with Dr. Gwenette Greet in 1 week  Murray Hodgkins, MD  [facility]  Pager [phone] If 7PM-7AM, please contact night-coverage at [email or web address], password [facility] 03/16/2015, 10:20 AM  LOS: 2 days   Consultants:    Procedures:    Antibiotics:  Azithromycin 5/8 >> 5/12  HPI/Subjective: Doing well. Breathing better.  Objective: Filed Vitals:   03/15/15 2103 03/16/15 0508 03/16/15 0728 03/16/15 1000  BP: 130/72 126/67  139/69  Pulse: 80 66  84  Temp: 98 F (36.7 C) 97.6 F (36.4 C)    TempSrc: Oral Oral    Resp: 20 18    Height:      Weight:      SpO2: 100% 100% 99%     Intake/Output Summary (Last 24 hours) at 03/16/15 1020 Last data filed at 03/16/15 0934  Gross per 24 hour  Intake    480 ml  Output   1550 ml  Net  -1070 ml     Filed Weights   03/14/15 0533  Weight: 40.597 kg (89 lb 8 oz)    Exam:     Afebrile, VSS, SpO2 97% on 2L. General:  Appears comfortable, calm. Cardiovascular: Regular rate and rhythm, no murmur, rub or gallop. No lower extremity edema. Respiratory: Clear to auscultation bilaterally, no wheezes, rales or rhonchi. Decreased resp effort, speaks in full sentences. Psychiatric: grossly normal mood and affect, speech fluent and appropriate  New data  reviewed:  Legionella and strep antigens negative.   Pertinent data since admission:  Chest x-ray: COPD. No acute findings.  EKG sinus rhythm. No acute changes.  Pending data:  Blood cultures no growth today   Respiratory virus panel  Scheduled Meds: . aspirin  81 mg Oral Daily  . atorvastatin  80 mg Oral Daily  . azithromycin  250 mg Oral Daily  . calcium carbonate  1,250 mg Oral BID WC  . feeding supplement (ENSURE ENLIVE)  237 mL Oral BID BM  . feeding supplement (PRO-STAT SUGAR FREE 64)  30 mL Oral BID  . heparin  5,000 Units Subcutaneous 3 times per day  . ipratropium-albuterol  3 mL Nebulization BID  . irbesartan  300 mg Oral Daily  . multivitamin  1 tablet Oral Daily  . multivitamin with minerals  1 tablet Oral Daily  . omega-3 acid ethyl esters  1 g Oral Daily  . polyethylene glycol  17 g Oral BID  . predniSONE  40 mg Oral Q breakfast   Continuous Infusions:   Principal Problem:   COPD with exacerbation Active Problems:   Anxiety   HTN (hypertension)   Anemia of chronic disease   Acute on chronic respiratory failure   Alcohol abuse   Protein-calorie malnutrition, severe

## 2015-03-16 NOTE — Discharge Summary (Signed)
Physician Discharge Summary  [name] MLJ:[id number] DOB: 1941/10/27 DOA: 03/13/2015  PCP: Jessica Pac, MD  Admit date: 03/13/2015 Discharge date: 03/16/2015  Recommendations for Outpatient Follow-up:  1. Resolution of COPD exacerbation 2. Severe malnutrition   Follow-up Information    Follow up with Jessica Pac, MD.   Specialty:  Family Medicine   Why:  As needed   Contact information:   [place] [address] [phone]       Follow up with Jessica Delton, MD. Go on 03/23/2015.   Specialty:  Pulmonary Disease   Why:  at 1:45pm    For Post Hospitalization Follow Up and COPD & Exacerbation   Contact information:   [place] [address] [phone]      Discharge Diagnoses:  1. COPD exacerbation 2. Acute on chronic hypoxic respiratory failure 3. Chronic hypoxic and hypercapnic respiratory failure 4. Normocytic anemia 5. Tobacco dependence in remission 6. Severe malnutrition  Discharge Condition: improved Disposition: home  Diet recommendation: heart healthy  Filed Weights   03/14/15 0533  Weight: 40.597 kg (89 lb 8 oz)    History of present illness:  89yow PMH COPD/home oxygen presented with increasing SOB. Admitted for COPD exacerbation.  Hospital Course:  [name] was treated with steroids, bronchodilators, oxygen and antibiotics. Her condition gradually improved an she is now stable for discharge. Her hospitalization was uncomplicated. Individual issues as below.  1. Acute COPD exacerbation with acute on chronic hypoxic respiratory failure. Acute component resolved.  2. COPD, chronic hypercapnic and hypoxic respiratory failure on 2L [place]. Stable on chronic oxygen. 3. Normocytic anemia. 4. Tobacco dependence in remission 5. Severe malnutrition.   Home today with prednisone taper, finish azithromycin  Discharge Instructions  Discharge Instructions    Diet - low sodium heart healthy    Complete by:   As directed      Discharge instructions    Complete by:  As directed   Call your physician or seek immediate medical attention for wheezing, shortness of breath, fever or worsening of condition.     Increase activity slowly    Complete by:  As directed           Current Discharge Medication List    START taking these medications   Details  Amino Acids-Protein Hydrolys (FEEDING SUPPLEMENT, PRO-STAT SUGAR FREE 64,) LIQD Take 30 mLs by mouth 2 (two) times daily. Qty: 1800 mL, Refills: 0    azithromycin (ZITHROMAX) 250 MG tablet Take 1 tablet (250 mg total) by mouth daily. Qty: 2 tablet, Refills: 0      CONTINUE these medications which have CHANGED   Details  albuterol (PROVENTIL) (2.5 MG/3ML) 0.083% nebulizer solution Take 3 mLs (2.5 mg total) by nebulization every 6 (six) hours as needed. Dx: 492.8 Qty: 360 mL, Refills: 0    benzonatate (TESSALON) 100 MG capsule Take 1 capsule (100 mg total) by mouth 3 (three) times daily as needed for cough. Qty: 90 capsule, Refills: 0    predniSONE (DELTASONE) 10 MG tablet Take 40 mg by mouth daily for 3 days, then take 30 mg by mouth daily for 3 days, then take 20 mg by mouth for 3 days, then resume daily 10mg . Qty: 27 tablet, Refills: 0      CONTINUE these medications which have NOT CHANGED   Details  ALPRAZolam (XANAX) 0.25 MG tablet Take 1 tablet (0.25 mg total) by mouth at bedtime as needed for sleep. Qty: 30 tablet, Refills: 0  aspirin 81 MG tablet Take 81 mg by mouth daily.      atorvastatin (LIPITOR) 80 MG tablet Take 80 mg by mouth daily.      BENICAR 40 MG tablet Take 40 mg by mouth daily.     budesonide-formoterol (SYMBICORT) 160-4.5 MCG/ACT inhaler INHALE 2 PUFFS INTO THE LUNGS 2 (TWO) TIMES DAILY. Qty: 1 Inhaler, Refills: 6    Calcium Carbonate (CALTRATE 600) 1500 MG TABS Take 1 tablet by mouth 2 (two) times daily.      Lutein 20 MG CAPS Take 20 mg by mouth daily.     Multiple Vitamin (MULTIVITAMIN WITH MINERALS)  TABS tablet Take 1 tablet by mouth daily.    Omega-3 Fatty Acids (FISH OIL) 1000 MG CAPS Take 1,000 mg by mouth 2 (two) times daily.     Polyethyl Glycol-Propyl Glycol (SYSTANE) 0.4-0.3 % SOLN Apply 1 drop to eye 2 (two) times daily as needed (for dryness).     tiotropium (SPIRIVA) 18 MCG inhalation capsule Place 1 capsule (18 mcg total) into inhaler and inhale daily. Qty: 30 capsule, Refills: 11   Associated Diagnoses: Other emphysema    albuterol (PROAIR HFA) 108 (90 BASE) MCG/ACT inhaler Inhale 2 puffs into the lungs every 6 (six) hours as needed for wheezing or shortness of breath. Qty: 1 Inhaler, Refills: 11   Associated Diagnoses: Other emphysema       Allergies  Allergen Reactions  . Oxycodone-Acetaminophen Nausea Only and Other (See Comments)    Headaches     The results of significant diagnostics from this hospitalization (including imaging, microbiology, ancillary and laboratory) are listed below for reference.    Significant Diagnostic Studies: Dg Chest 2 View  03/14/2015   CLINICAL DATA:  Dyspnea, progressively worsening since yesterday morning.  EXAM: CHEST  2 VIEW  COMPARISON:  11/30/2014  FINDINGS: There is hyperinflation and upper lobe emphysematous change. There is chronic coarsening of the interstitium in the basilar periphery. There is no evidence of superimposed acute infiltrate or CHF. There is no pleural effusion. There is no pneumothorax. Hilar, mediastinal and cardiac contours are unremarkable and unchanged.  IMPRESSION: COPD.  No superimposed acute findings.   Electronically Signed   By: Andreas Newport M.D.   On: 03/14/2015 01:54    Microbiology: Recent Results (from the past 240 hour(s))  Culture, blood (routine x 2) Call MD if unable to obtain prior to antibiotics being given     Status: None (Preliminary result)   Collection Time: 03/14/15  7:27 AM  Result Value Ref Range Status   Specimen Description BLOOD LEFT ARM  Final   Special Requests   Final     BOTTLES DRAWN AEROBIC AND ANAEROBIC 10CC BOTH BOTTLES   Culture   Final           BLOOD CULTURE RECEIVED NO GROWTH TO DATE CULTURE WILL BE HELD FOR 5 DAYS BEFORE ISSUING A FINAL NEGATIVE REPORT Performed at [facility]    Report Status PENDING  Incomplete  Culture, blood (routine x 2) Call MD if unable to obtain prior to antibiotics being given     Status: None (Preliminary result)   Collection Time: 03/14/15  7:34 AM  Result Value Ref Range Status   Specimen Description BLOOD RIGHT HAND  Final   Special Requests   Final    BOTTLES DRAWN AEROBIC AND ANAEROBIC 10CC BOTH BOTTLES   Culture   Final           BLOOD CULTURE RECEIVED NO GROWTH TO  DATE CULTURE WILL BE HELD FOR 5 DAYS BEFORE ISSUING A FINAL NEGATIVE REPORT Performed at [facility]    Report Status PENDING  Incomplete     Labs: Basic Metabolic Panel:  Recent Labs Lab 03/14/15 0117  NA 140  K 4.5  CL 94*  CO2 41*  GLUCOSE 119*  BUN 15  CREATININE 0.55  CALCIUM 9.9   CBC:  Recent Labs Lab 03/14/15 0308  WBC 12.6*  NEUTROABS 10.0*  HGB 10.5*  HCT 34.8*  MCV 101.5*  PLT 199     Recent Labs  11/30/14 0852 03/14/15 0118  BNP 33.5 33.6    Principal Problem:   COPD with exacerbation Active Problems:   Anxiety   HTN (hypertension)   Anemia of chronic disease   Acute on chronic respiratory failure   Alcohol abuse   Protein-calorie malnutrition, severe   Time coordinating discharge: 35 minutes  Signed:  Murray Hodgkins, MD [facility] 03/16/2015, 10:29 AM

## 2015-03-17 LAB — RESPIRATORY VIRUS PANEL
ADENOVIRUS: NEGATIVE
Influenza A: NEGATIVE
Influenza B: NEGATIVE
Metapneumovirus: NEGATIVE
PARAINFLUENZA 3 A: NEGATIVE
Parainfluenza 1: NEGATIVE
Parainfluenza 2: NEGATIVE
RHINOVIRUS: NEGATIVE
Respiratory Syncytial Virus A: NEGATIVE
Respiratory Syncytial Virus B: NEGATIVE

## 2015-03-20 LAB — CULTURE, BLOOD (ROUTINE X 2)
CULTURE: NO GROWTH
Culture: NO GROWTH

## 2015-03-23 ENCOUNTER — Encounter: Payer: Self-pay | Admitting: Pulmonary Disease

## 2015-03-23 ENCOUNTER — Ambulatory Visit (INDEPENDENT_AMBULATORY_CARE_PROVIDER_SITE_OTHER): Payer: Medicare Other | Admitting: Pulmonary Disease

## 2015-03-23 VITALS — BP 122/70 | HR 100 | Temp 98.4°F | Ht 61.0 in | Wt 86.6 lb

## 2015-03-23 DIAGNOSIS — J9611 Chronic respiratory failure with hypoxia: Secondary | ICD-10-CM | POA: Diagnosis not present

## 2015-03-23 DIAGNOSIS — J438 Other emphysema: Secondary | ICD-10-CM | POA: Diagnosis not present

## 2015-03-23 MED ORDER — PREDNISONE 10 MG PO TABS: ORAL_TABLET | ORAL | Status: DC

## 2015-03-23 MED ORDER — BUDESONIDE-FORMOTEROL FUMARATE 160-4.5 MCG/ACT IN AERO: INHALATION_SPRAY | RESPIRATORY_TRACT | Status: DC

## 2015-03-23 MED ORDER — ALBUTEROL SULFATE (2.5 MG/3ML) 0.083% IN NEBU: 2.5000 mg | INHALATION_SOLUTION | Freq: Four times a day (QID) | RESPIRATORY_TRACT | Status: DC | PRN

## 2015-03-23 MED ORDER — TIOTROPIUM BROMIDE MONOHYDRATE 18 MCG IN CAPS: 18.0000 ug | ORAL_CAPSULE | Freq: Every day | RESPIRATORY_TRACT | Status: DC

## 2015-03-23 NOTE — Assessment & Plan Note (Signed)
The patient has had a recent acute exacerbation which required hospitalization, but she improved very quickly and is slowly returning to her normal baseline. I have asked her to continue on her current medications, including her prednisone at 10 mg a day. She is working with physical therapy at home, and I've asked her to stay as active as possible.

## 2015-03-23 NOTE — Patient Instructions (Signed)
No change in medications.  Stay on oxygen followup with Dr. Lamonte Sakai in 81mos

## 2015-03-23 NOTE — Progress Notes (Signed)
   Subjective:    Patient ID: [name], female    DOB: [birth year], 74 y.o.   MRN: [id number]  HPI The patient comes in today for follow-up of her known severe COPD with chronic respiratory failure. He was in the hospital in May with an acute exacerbation, but has done very well since discharge. She is staying on her medications and oxygen, and is getting physical therapy at home. She feels that her breathing has returned to baseline, and she has no significant cough or mucus production.   Review of Systems  Constitutional: Negative for fever and unexpected weight change.  HENT: Positive for congestion and postnasal drip. Negative for dental problem, ear pain, nosebleeds, rhinorrhea, sinus pressure, sneezing, sore throat and trouble swallowing.   Eyes: Negative for redness and itching.  Respiratory: Positive for cough, chest tightness, shortness of breath and wheezing.   Cardiovascular: Negative for palpitations and leg swelling.  Gastrointestinal: Negative for nausea and vomiting.  Genitourinary: Negative for dysuria.  Musculoskeletal: Negative for joint swelling.  Skin: Negative for rash.  Neurological: Negative for headaches.  Hematological: Does not bruise/bleed easily.  Psychiatric/Behavioral: Negative for dysphoric mood. The patient is not nervous/anxious.        Objective:   Physical Exam Thin female in no acute distress Nose without purulence or discharge noted Neck without lymphadenopathy or thyromegaly Chest with very diminished breath sounds, no active wheezing Cardiac exam with regular rate and rhythm Lower extremities with no significant edema, no cyanosis Alert and oriented, moves all 4 extremities.       Assessment & Plan:

## 2015-07-26 ENCOUNTER — Emergency Department (HOSPITAL_COMMUNITY)
Admission: EM | Admit: 2015-07-26 | Discharge: 2015-07-26 | Disposition: A | Discharge status: Home / Self Care | Payer: Medicare Other | Attending: Emergency Medicine | Admitting: Emergency Medicine

## 2015-07-26 ENCOUNTER — Encounter (HOSPITAL_COMMUNITY): Payer: Self-pay

## 2015-07-26 DIAGNOSIS — R0789 Other chest pain: Secondary | ICD-10-CM | POA: Diagnosis not present

## 2015-07-26 DIAGNOSIS — I10 Essential (primary) hypertension: Secondary | ICD-10-CM | POA: Diagnosis not present

## 2015-07-26 DIAGNOSIS — Z87891 Personal history of nicotine dependence: Secondary | ICD-10-CM | POA: Diagnosis not present

## 2015-07-26 DIAGNOSIS — Z79899 Other long term (current) drug therapy: Secondary | ICD-10-CM | POA: Diagnosis not present

## 2015-07-26 DIAGNOSIS — J449 Chronic obstructive pulmonary disease, unspecified: Secondary | ICD-10-CM | POA: Insufficient documentation

## 2015-07-26 DIAGNOSIS — Z8543 Personal history of malignant neoplasm of ovary: Secondary | ICD-10-CM | POA: Diagnosis not present

## 2015-07-26 DIAGNOSIS — R079 Chest pain, unspecified: Secondary | ICD-10-CM | POA: Diagnosis present

## 2015-07-26 DIAGNOSIS — Z7952 Long term (current) use of systemic steroids: Secondary | ICD-10-CM | POA: Diagnosis not present

## 2015-07-26 DIAGNOSIS — Z7982 Long term (current) use of aspirin: Secondary | ICD-10-CM | POA: Diagnosis not present

## 2015-07-26 DIAGNOSIS — E785 Hyperlipidemia, unspecified: Secondary | ICD-10-CM | POA: Insufficient documentation

## 2015-07-26 LAB — I-STAT TROPONIN, ED: Troponin i, poc: 0.01 ng/mL (ref 0.00–0.08)

## 2015-07-26 MED ORDER — ASPIRIN 81 MG PO CHEW
81.0000 mg | CHEWABLE_TABLET | Freq: Once | ORAL | Status: AC
  Administered 2015-07-26: 81 mg via ORAL
  Filled 2015-07-26: qty 1

## 2015-07-26 NOTE — ED Provider Notes (Signed)
  Face-to-face evaluation   History: Onset left anterior chest pain today, several separate episodes, very brief. No fever, chills, cough, diaphoresis, weakness or dizziness.    Physical exam: Alert, elderly female with mild increased work of breathing. Lungs have diminished air movement bilaterally. No audible wheezes, Rales or rhonchi. Oxygen  saturation 100% on nasal cannula oxygen.  Medical screening examination/treatment/procedure(s) were conducted as a shared visit with non-physician practitioner(s) and myself.  I personally evaluated the patient during the encounter  Daleen Bo, MD 07/26/15 2102

## 2015-07-26 NOTE — Discharge Instructions (Signed)

## 2015-07-26 NOTE — ED Provider Notes (Signed)
CSN: [id number]     Arrival date & time 07/26/15  1221 History   First MD Initiated Contact with Patient 07/26/15 1237     Chief Complaint  Patient presents with  . Chest Pain     (Consider location/radiation/quality/duration/timing/severity/associated sxs/prior Treatment) HPI   [name] is a 74 y.o. female with PMH significant for COPD, upper lipidemia, hypertension, and chronic respiratory failure who presents with Sternal and left-sided intermittent, nonradiating, nagging chest painThat began this morning at 7 AM. Nothing makes it better or worse. Denies headache, neck stiffness, shortness of breath, abdominal pain, nausea, vomiting, diarrhea, or urinary symptoms. He states she has not smoked since the 90s.   Past Medical History  Diagnosis Date  . Emphysema   . 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 Hyperlipidemia   . Hypertension   . Blood transfusion without reported diagnosis   . Emphysema of lung   . Osteoporosis   . SBO (small bowel obstruction) s/p LOA 11/07/2012 11/04/2012  . COPD (chronic obstructive pulmonary disease) with emphysema 11/05/2012    [facility] 2001:  FEV1 0.50, ratio 28 Pulmonary rehab 2013   . Chronic respiratory failure 07/15/2014   Past Surgical History  Procedure Laterality Date  . Abdominal hysterectomy    . Laparoscopy  11/07/2012    Procedure: LAPAROSCOPY DIAGNOSTIC;  Surgeon: Ralene Ok, MD;  Location: [facility];  Service: General;  Laterality: N/A;  . Laparotomy  11/07/2012    Procedure: EXPLORATORY LAPAROTOMY;  Surgeon: Ralene Ok, MD;  Location: [facility];  Service: General;  Laterality: N/A;  . Laparoscopic lysis of adhesions  11/07/2012    Procedure: LAPAROSCOPIC LYSIS OF ADHESIONS;  Surgeon: Ralene Ok, MD;  Location: [facility];  Service: General;  Laterality: N/A;  . Lysis of adhesion  11/07/2012    Procedure: LYSIS OF ADHESION;  Surgeon: Ralene Ok, MD;  Location: [facility];  Service: General;;   Family History  Problem Relation Age of Onset  .  Heart failure Mother   . Heart attack Father   . Heart attack Paternal Grandmother    Social History  Substance Use Topics  . Smoking status: Former Smoker -- 1.00 packs/day for 30 years    Types: Cigarettes    Quit date: 11/06/1992  . Smokeless tobacco: Never Used  . Alcohol Use: 0.6 oz/week    1 Glasses of wine per week   OB History    No data available     Review of Systems All other systems negative unless otherwise stated in HPI    Allergies  Oxycodone-acetaminophen  Home Medications   Prior to Admission medications   Medication Sig Start Date End Date Taking? Authorizing Provider  albuterol (PROAIR HFA) 108 (90 BASE) MCG/ACT inhaler Inhale 2 puffs into the lungs every 6 (six) hours as needed for wheezing or shortness of breath. 12/03/14   Venetia Maxon Rama, MD  albuterol (PROVENTIL) (2.5 MG/3ML) 0.083% nebulizer solution Take 3 mLs (2.5 mg total) by nebulization every 6 (six) hours as needed. Dx: 492.8 03/23/15 03/22/16  Kathee Delton, MD  ALPRAZolam Duanne Moron) 0.25 MG tablet Take 1 tablet (0.25 mg total) by mouth at bedtime as needed for sleep. 12/03/14   Venetia Maxon Rama, MD  aspirin 81 MG tablet Take 81 mg by mouth daily.      Historical Provider, MD  atorvastatin (LIPITOR) 80 MG tablet Take 80 mg by mouth daily.      Historical Provider, MD  BENICAR 40 MG tablet Take 40 mg by mouth  daily.     Historical Provider, MD  benzonatate (TESSALON) 100 MG capsule Take 1 capsule (100 mg total) by mouth 3 (three) times daily as needed for cough. 03/16/15   Samuella Cota, MD  budesonide-formoterol (SYMBICORT) 160-4.5 MCG/ACT inhaler INHALE 2 PUFFS INTO THE LUNGS 2 (TWO) TIMES DAILY. 03/23/15   Kathee Delton, MD  Calcium Carbonate (CALTRATE 600) 1500 MG TABS Take 1 tablet by mouth 2 (two) times daily.      Historical Provider, MD  Lutein 20 MG CAPS Take 20 mg by mouth daily.     Historical Provider, MD  Multiple Vitamin (MULTIVITAMIN WITH MINERALS) TABS tablet Take 1 tablet by mouth  daily.    Historical Provider, MD  Omega-3 Fatty Acids (FISH OIL) 1000 MG CAPS Take 1,000 mg by mouth 2 (two) times daily.     Historical Provider, MD  Polyethyl Glycol-Propyl Glycol (SYSTANE) 0.4-0.3 % SOLN Apply 1 drop to eye 2 (two) times daily as needed (for dryness).     Historical Provider, MD  predniSONE (DELTASONE) 10 MG tablet 10 mg daily. 03/23/15   Kathee Delton, MD  tiotropium (SPIRIVA) 18 MCG inhalation capsule Place 1 capsule (18 mcg total) into inhaler and inhale daily. 03/23/15   Kathee Delton, MD   BP 159/59 mmHg  Pulse 97  Temp(Src) 98.7 F (37.1 C) (Oral)  Resp 24  Wt 86 lb (39.009 kg)  SpO2 100% Physical Exam  Constitutional: She is oriented to person, place, and time. She appears well-developed and well-nourished.  HENT:  Head: Normocephalic and atraumatic.  Mouth/Throat: Oropharynx is clear and moist.  Eyes: Pupils are equal, round, and reactive to light.  Neck: Normal range of motion. Neck supple.  Cardiovascular: Normal rate, regular rhythm and intact distal pulses.   No murmur heard. Capillary refill less than 3 seconds.   Pulmonary/Chest: Effort normal. No respiratory distress. She has decreased breath sounds. She has no wheezes. She has no rhonchi. She has no rales. She exhibits no tenderness, no bony tenderness, no crepitus, no deformity and no retraction.  Abdominal: Soft. Bowel sounds are normal. She exhibits no distension. There is no tenderness.  Musculoskeletal: Normal range of motion.  No TTP along anterior chest wall.  Non-reproducible chest pain.   Lymphadenopathy:    She has no cervical adenopathy.  Neurological: She is alert and oriented to person, place, and time.  Skin: Skin is warm and dry.  Psychiatric: She has a normal mood and affect. Her behavior is normal.    ED Course  Procedures (including critical care time) [place], ED    Imaging Review No results found. I have personally reviewed and  evaluated these images and lab results as part of my medical decision-making.   EKG Interpretation   Date/Time:  Monday July 26 2015 12:26:02 EDT Ventricular Rate:  97 PR Interval:  134 QRS Duration: 69 QT Interval:  352 QTC Calculation: 447 R Axis:   44 Text Interpretation:  Sinus rhythm Borderline low voltage, extremity leads  since last tracing no significant change Confirmed by Eulis Foster  MD, ELLIOTT  ([id number]) on 07/26/2015 1:01:25 PM      MDM   Final diagnoses:  None   Patient presents with chest pain.  VSS, patient appears nontoxic, NAD.  On exam, no chest wall tenderness, capillary refill less than 3 seconds, decreased breath sounds (likely secondary to COPD).   Suspect noncardiac chest pain.  Low suspicion for cardiac etiology.   Imaging  includes CXR, which she declined.  Labs normal  troponin.   EKG shows no evidence of cardiac ischemia.  Suspect noncardiac chest pain.  HEART score 3.   Pt stable for d/c.  Advised to follow up with PCP.  Discussed return precautions and supportive care.  Patient acknowledges and agrees with the above plan.  Case has been discussed with and seen by Dr. Eulis Foster who agrees with the above plan for discharge.     Gloriann Loan, PA-C 07/26/15 [place], MD 07/26/15 2102

## 2015-07-26 NOTE — ED Notes (Signed)
Pt alert x4 respirations at baseline

## 2015-07-26 NOTE — ED Notes (Signed)
Bib ems r/t to chest pain states "ache" intermediate non radiating denies any associated symptoms. Denies pain at this time

## 2015-07-30 ENCOUNTER — Ambulatory Visit (INDEPENDENT_AMBULATORY_CARE_PROVIDER_SITE_OTHER): Payer: Medicare Other | Admitting: Emergency Medicine

## 2015-07-30 ENCOUNTER — Encounter: Payer: Self-pay | Admitting: Emergency Medicine

## 2015-07-30 VITALS — BP 160/102 | HR 103 | Ht 61.0 in | Wt 86.0 lb

## 2015-07-30 DIAGNOSIS — J438 Other emphysema: Secondary | ICD-10-CM | POA: Diagnosis not present

## 2015-07-30 MED ORDER — PREDNISONE 10 MG PO TABS: ORAL_TABLET | ORAL | Status: DC

## 2015-07-30 NOTE — Patient Instructions (Addendum)
Flu shot up to date.  Please continue your Spiriva and Spiriva Use albuterol as needed for shortness of breath Take Prednisone taper as follows: Take 30mg  daily for 3 days, then 20mg  daily for 3 days, then go back to 10mg  daily We will consider starting Daliresp at your next visit if you are recovered from this flare.  Follow with Dr Lamonte Sakai in 1 month

## 2015-07-30 NOTE — Progress Notes (Signed)
Subjective:    Patient ID: [name], female    DOB: 08/13/1941, 74 y.o.   MRN: [id number]  HPI 74 year old former smoker (30 pack years) with a history of hypertension, ovarian cancer, and COPD with chronic hypoxemic respiratory failure that has be followed by Dr Gwenette Greet.  I have personally reviewed her spirometry from 06/14/2000 that showed severe obstruction with FEV1 of 0.5 L or 23% predicted.  I have reviewed her hospital notes and see that she was just in the emergency department on 07/26/15 > some L-mid substernal pain, had a reassuring eval, no evidence for MI. She is on Spiriva + Symbicort, pred 10mg , O2 at 2L/min at all times. Her last exacerbation was on 03/2015.    Review of Systems  As per history of present illness  Past Medical History  Diagnosis Date  . Emphysema   . 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 Hyperlipidemia   . Hypertension   . Blood transfusion without reported diagnosis   . Emphysema of lung   . Osteoporosis   . SBO (small bowel obstruction) s/p LOA 11/07/2012 11/04/2012  . COPD (chronic obstructive pulmonary disease) with emphysema 11/05/2012    [facility] 2001:  FEV1 0.50, ratio 28 Pulmonary rehab 2013   . Chronic respiratory failure 07/15/2014     Family History  Problem Relation Age of Onset  . Heart failure Mother   . Heart attack Father   . Heart attack Paternal Grandmother      Social History   Social History  . Marital Status: Single    Spouse Name: N/A  . Number of Children: N/A  . Years of Education: N/A   Occupational History  . Not on file.   Social History Main Topics  . Smoking status: Former Smoker -- 1.00 packs/day for 30 years    Types: Cigarettes    Quit date: 11/06/1992  . Smokeless tobacco: Never Used  . Alcohol Use: 0.6 oz/week    1 Glasses of wine per week  . Drug Use: No  . Sexual Activity: No   Other Topics Concern  . Not on file   Social History Narrative     Allergies  Allergen Reactions  . Oxycodone-Acetaminophen  Nausea Only and Other (See Comments)    Headaches      Outpatient Prescriptions Prior to Visit  Medication Sig Dispense Refill  . albuterol (PROAIR HFA) 108 (90 BASE) MCG/ACT inhaler Inhale 2 puffs into the lungs every 6 (six) hours as needed for wheezing or shortness of breath. 1 Inhaler 11  . albuterol (PROVENTIL) (2.5 MG/3ML) 0.083% nebulizer solution Take 3 mLs (2.5 mg total) by nebulization every 6 (six) hours as needed. Dx: 492.8 360 mL 12  . ALPRAZolam (XANAX) 0.25 MG tablet Take 1 tablet (0.25 mg total) by mouth at bedtime as needed for sleep. 30 tablet 0  . aspirin 81 MG tablet Take 81 mg by mouth daily.      Marland Kitchen atorvastatin (LIPITOR) 80 MG tablet Take 80 mg by mouth daily.      Marland Kitchen BENICAR 40 MG tablet Take 40 mg by mouth daily.     . benzonatate (TESSALON) 100 MG capsule Take 1 capsule (100 mg total) by mouth 3 (three) times daily as needed for cough. 90 capsule 0  . budesonide-formoterol (SYMBICORT) 160-4.5 MCG/ACT inhaler INHALE 2 PUFFS INTO THE LUNGS 2 (TWO) TIMES DAILY. 1 Inhaler 12  . Calcium Carbonate (CALTRATE 600) 1500 MG TABS Take 1 tablet by mouth 2 (two) times  daily.      . Lutein 20 MG CAPS Take 20 mg by mouth daily.     . Multiple Vitamin (MULTIVITAMIN WITH MINERALS) TABS tablet Take 1 tablet by mouth daily.    . Omega-3 Fatty Acids (FISH OIL) 1000 MG CAPS Take 1,000 mg by mouth 2 (two) times daily.     Vladimir Faster Glycol-Propyl Glycol (SYSTANE) 0.4-0.3 % SOLN Apply 1 drop to eye 2 (two) times daily as needed (for dryness).     Marland Kitchen tiotropium (SPIRIVA) 18 MCG inhalation capsule Place 1 capsule (18 mcg total) into inhaler and inhale daily. 30 capsule 12  . predniSONE (DELTASONE) 10 MG tablet 10 mg daily. 30 tablet 12   No facility-administered medications prior to visit.         Objective:   Physical Exam Filed Vitals:   07/30/15 1350  BP: 160/102  Pulse: 103  Height: 5\' 1"  (1.549 m)  Weight: 86 lb (39.009 kg)  SpO2: 98%   Gen: Pleasant, thin, in a wheelchair,  in no distress,  normal affect  ENT: No lesions,  mouth clear,  oropharynx clear, no postnasal drip  Neck: No JVD, some gravel voice, no stridor  Lungs: No use of accessory muscles, clear without rales or rhonchi, no wheeze  Cardiovascular: RRR, heart sounds normal, no murmur or gallops, no peripheral edema  Musculoskeletal: No deformities, no cyanosis or clubbing  Neuro: alert, non focal  Skin: Warm, no lesions or rashes      Assessment & Plan:  COPD (chronic obstructive pulmonary disease) with emphysema Significant anxiety component in the setting of severe obstructive lung disease. She will likely in the near future need symptomatic medications that go beyond bronchodilators, for example narcotics or anxiolytics. She is not wheezing today but does feel an increase in her dyspnea. I will give her a prednisone taper at her request, refill her standing prednisone. We will discuss daliresp, anxiolytics, possibly even morphine in the future  Flu shot up to date.  Please continue your Spiriva and Spiriva Use albuterol as needed for shortness of breath Take Prednisone taper as follows: Take 30mg  daily for 3 days, then 20mg  daily for 3 days, then go back to 10mg  daily We will consider starting Daliresp at your next visit if you are recovered from this flare.  Follow with Dr Lamonte Sakai in 1 month

## 2015-07-30 NOTE — Assessment & Plan Note (Signed)
Significant anxiety component in the setting of severe obstructive lung disease. She will likely in the near future need symptomatic medications that go beyond bronchodilators, for example narcotics or anxiolytics. She is not wheezing today but does feel an increase in her dyspnea. I will give her a prednisone taper at her request, refill her standing prednisone. We will discuss daliresp, anxiolytics, possibly even morphine in the future  Flu shot up to date.  Please continue your Spiriva and Spiriva Use albuterol as needed for shortness of breath Take Prednisone taper as follows: Take 30mg  daily for 3 days, then 20mg  daily for 3 days, then go back to 10mg  daily We will consider starting Daliresp at your next visit if you are recovered from this flare.  Follow with Dr Lamonte Sakai in 1 month

## 2015-08-16 ENCOUNTER — Other Ambulatory Visit: Payer: Self-pay | Admitting: *Deleted

## 2015-08-16 MED ORDER — ALBUTEROL SULFATE (2.5 MG/3ML) 0.083% IN NEBU: 2.5000 mg | INHALATION_SOLUTION | Freq: Four times a day (QID) | RESPIRATORY_TRACT | Status: DC | PRN

## 2015-09-03 ENCOUNTER — Ambulatory Visit: Payer: Medicare Other | Admitting: Emergency Medicine

## 2015-09-09 ENCOUNTER — Encounter: Payer: Self-pay | Admitting: Emergency Medicine

## 2015-09-09 ENCOUNTER — Ambulatory Visit (INDEPENDENT_AMBULATORY_CARE_PROVIDER_SITE_OTHER): Payer: Medicare Other | Admitting: Emergency Medicine

## 2015-09-09 VITALS — BP 150/102 | HR 110 | Ht 61.0 in | Wt 85.0 lb

## 2015-09-09 DIAGNOSIS — J9611 Chronic respiratory failure with hypoxia: Secondary | ICD-10-CM | POA: Diagnosis not present

## 2015-09-09 DIAGNOSIS — J438 Other emphysema: Secondary | ICD-10-CM

## 2015-09-09 NOTE — Patient Instructions (Addendum)
Please continue your inhaled medications as you are them  Continue to use your oxygen at 2L/min at rest We will perform a walking oximetry to determine how much oxygen you need to use when walking.  Follow with Dr Lamonte Sakai in 3 months or sooner if you have any problems.

## 2015-09-09 NOTE — Progress Notes (Signed)
   Subjective:    Patient ID: [name], female    DOB: Aug 31, 1941, 74 y.o.   MRN: [id number]  HPI 74 year-old former smoker (30 pack years) with a history of hypertension, ovarian cancer, and COPD with chronic hypoxemic respiratory failure that has be followed by Dr Gwenette Greet.  I have personally reviewed her spirometry from 06/14/2000 that showed severe obstruction with FEV1 of 0.5 L or 23% predicted.  I have reviewed her hospital notes and see that she was just in the emergency department on 07/26/15 > some L-mid substernal pain, had a reassuring eval, no evidence for MI. She is on Spiriva + Symbicort, pred 10mg , O2 at 2L/min at all times. Her last exacerbation was on 03/2015.   ROV 09/09/15 --  Follow up today for severe COPD, hypoxemic resp failure. She is on 2 L/min. she desaturates to mid 80's w exertion. We treated her for an AE last visit and tapered pred back to 10mg  daily. She needs a lighter more portable advanced homecare. Her breathing is about the same - the prednisone helped while she was on it.    Review of Systems  As per history of present illness     Objective:   Physical Exam Filed Vitals:   09/09/15 1328  BP: 150/102  Pulse: 110  Height: 5\' 1"  (1.549 m)  Weight: 85 lb (38.556 kg)  SpO2: 95%   Gen: Pleasant, thin, in a wheelchair, in no distress,  normal affect  ENT: No lesions,  mouth clear,  oropharynx clear, no postnasal drip  Neck: No JVD, some gravel voice, no stridor  Lungs: No use of accessory muscles, clear without rales or rhonchi, no wheeze  Cardiovascular: RRR, heart sounds normal, no murmur or gallops, no peripheral edema  Musculoskeletal: No deformities, no cyanosis or clubbing  Neuro: alert, non focal  Skin: Warm, no lesions or rashes      Assessment & Plan:  COPD (chronic obstructive pulmonary disease) with emphysema Currently stable although remains quite limited with exertion. She has identified exertional desaturations on 2 L/m. There are  some barriers to her increasing her oxygen including the weight of her portable device and also difficulty bending over to adjust her concentrator but I would like to perform walking titration today to determine how much she needs with ambulation. Continue current bronchodilators and prednisone 10 mg daily  Chronic respiratory failure We will adjust her oxygen as able. See above

## 2015-09-09 NOTE — Assessment & Plan Note (Signed)
Currently stable although remains quite limited with exertion. She has identified exertional desaturations on 2 L/m. There are some barriers to her increasing her oxygen including the weight of her portable device and also difficulty bending over to adjust her concentrator but I would like to perform walking titration today to determine how much she needs with ambulation. Continue current bronchodilators and prednisone 10 mg daily

## 2015-09-09 NOTE — Assessment & Plan Note (Signed)
We will adjust her oxygen as able. See above

## 2015-09-21 ENCOUNTER — Telehealth: Payer: Self-pay | Admitting: Emergency Medicine

## 2015-09-21 NOTE — Telephone Encounter (Signed)
Spoke with pt. States that she saw her PCP on 09/15/15. He gave her a prednisone taper due to her having increased SOB. She is wanting to know if RB will give her a standing prescription for a prednisone taper. This would ease her mind to have this she does not drive and can't just call us have prescriptions sent to her pharmacy. Advised her that RB is not here today but I would get this message him to address.  RB - please advise. Thanks.

## 2015-09-22 NOTE — Telephone Encounter (Signed)
It may be possible to do this, although there may also be other better options. We could consider a standing scheduled low dose of prednisone, or other alternatives to relieve SOB. I typically avoid giving pred for pt to self medicate as I like to be notified of clinical changes. We can probably give her a single script for pred taper with understanding hat she will notify us and discuss before starting.

## 2015-09-22 NOTE — Telephone Encounter (Signed)
RB - please advise. 

## 2015-09-23 ENCOUNTER — Ambulatory Visit: Payer: Medicare Other | Admitting: Emergency Medicine

## 2015-09-23 MED ORDER — PREDNISONE 10 MG PO TABS: ORAL_TABLET | ORAL | Status: DC

## 2015-09-23 NOTE — Telephone Encounter (Signed)
Spoke with pt. She is aware of RB's response. Pt was very adamant about having the prescription sent in. RB's pred taper has been sent. She knows to call us prior to taking this medication.

## 2015-11-16 ENCOUNTER — Ambulatory Visit (INDEPENDENT_AMBULATORY_CARE_PROVIDER_SITE_OTHER): Payer: Medicare Other | Admitting: Emergency Medicine

## 2015-11-16 ENCOUNTER — Encounter: Payer: Self-pay | Admitting: Emergency Medicine

## 2015-11-16 ENCOUNTER — Telehealth: Payer: Self-pay | Admitting: Emergency Medicine

## 2015-11-16 VITALS — BP 150/92 | HR 102 | Wt 81.0 lb

## 2015-11-16 DIAGNOSIS — J438 Other emphysema: Secondary | ICD-10-CM

## 2015-11-16 MED ORDER — AZITHROMYCIN 250 MG PO TABS: ORAL_TABLET | ORAL | Status: DC

## 2015-11-16 MED ORDER — PREDNISONE 10 MG PO TABS: ORAL_TABLET | ORAL | Status: DC

## 2015-11-16 NOTE — Progress Notes (Signed)
   Subjective:    Patient ID: [name], female    DOB: 08/21/1941, 75 y.o.   MRN: [id number]  HPI 75 year-old former smoker (30 pack years) with a history of hypertension, ovarian cancer, and COPD with chronic hypoxemic respiratory failure that has be followed by Dr Gwenette Greet.  I have personally reviewed her spirometry from 06/14/2000 that showed severe obstruction with FEV1 of 0.5 L or 23% predicted.  I have reviewed her hospital notes and see that she was just in the emergency department on 07/26/15 > some L-mid substernal pain, had a reassuring eval, no evidence for MI. She is on Spiriva + Symbicort, pred 10mg , O2 at 2L/min at all times. Her last exacerbation was on 03/2015.   ROV 09/09/15 --  Follow up today for severe COPD, hypoxemic resp failure. She is on 2 L/min. she desaturates to mid 80's w exertion. We treated her for an AE last visit and tapered pred back to 10mg  daily. She needs a lighter more portable advanced homecare. Her breathing is about the same - the prednisone helped while she was on it.   ROV 11/16/15 -- severe COPD, hypoxemic resp failure. She is on chronic pred 10mg .  She has been doing well until about a week ago. More exertional SOB, more SOB with speaking. She has noticed hoarseness, globus sensation. More cough than usual, has used some tessalon. No sick contact. She started pred taper that I gave her for emergencies on 1/9 > feels a bit better but not to baseline. She remains on spiriva and symbicort, has been using albuterol more, used it 2x yesterday.  No purulence.    Review of Systems  As per history of present illness     Objective:   Physical Exam Filed Vitals:   11/16/15 1404 11/16/15 1405  BP:  150/92  Pulse:  102  Weight: 81 lb (36.741 kg)   SpO2:  99%   Gen: Pleasant, thin, in a wheelchair, in no distress,  normal affect  ENT: No lesions,  mouth clear,  oropharynx clear, no postnasal drip  Neck: No JVD, some gravel voice, no stridor  Lungs: No use  of accessory muscles, clear without rales or rhonchi, no wheeze  Cardiovascular: RRR, heart sounds normal, no murmur or gallops, no peripheral edema  Musculoskeletal: No deformities, no cyanosis or clubbing  Neuro: alert, non focal  Skin: Warm, no lesions or rashes      Assessment & Plan:  COPD (chronic obstructive pulmonary disease) with emphysema With an acute exacerbation, just started her pred taper. I will add azithromycin, continue her other meds and follow closely   Continue prednisone taper as written Take azithromycin as prescribed Continue spiriva and symbicort  Keep albuterol available to use as needed for shortness of breath.  Follow with Dr Lamonte Sakai or Tammy Parrett in 3 -4 weeks to assess your improvement.

## 2015-11-16 NOTE — Assessment & Plan Note (Signed)
With an acute exacerbation, just started her pred taper. I will add azithromycin, continue her other meds and follow closely   Continue prednisone taper as written Take azithromycin as prescribed Continue spiriva and symbicort  Keep albuterol available to use as needed for shortness of breath.  Follow with Dr Lamonte Sakai or Tammy Parrett in 3 -4 weeks to assess your improvement.

## 2015-11-16 NOTE — Telephone Encounter (Signed)
Pt cb and sched appt for today 11/16/15 at 145pm

## 2015-11-16 NOTE — Patient Instructions (Signed)
Continue prednisone taper as written Take azithromycin as prescribed Continue spiriva and symbicort  Keep albuterol available to use as needed for shortness of breath.  Follow with Dr Lamonte Sakai or Tammy Parrett in 3 -4 weeks to assess your improvement.

## 2015-11-16 NOTE — Telephone Encounter (Signed)
Spoke with pt. States that she has been coughing and short of breath for 3 days. This AM she coughed up mucus that was pale yellow. Has been having to use her nebs more than usual. Denies chest tightness, wheezing or fever. Started the prednisone taper we gave her few months back yesterday. Would like an antibiotic sent in.  RB - please advise.  Thanks.

## 2015-11-18 ENCOUNTER — Emergency Department (HOSPITAL_COMMUNITY): Payer: Medicare Other

## 2015-11-18 ENCOUNTER — Encounter (HOSPITAL_COMMUNITY): Payer: Self-pay

## 2015-11-18 ENCOUNTER — Inpatient Hospital Stay (HOSPITAL_COMMUNITY)
Admission: EM | Admit: 2015-11-18 | Discharge: 2015-11-25 | DRG: 190 | Disposition: A | Discharge status: Skilled Nursing Facility | Payer: Medicare Other | Attending: Pulmonary Disease | Admitting: Pulmonary Disease

## 2015-11-18 DIAGNOSIS — F419 Anxiety disorder, unspecified: Secondary | ICD-10-CM | POA: Diagnosis present

## 2015-11-18 DIAGNOSIS — D539 Nutritional anemia, unspecified: Secondary | ICD-10-CM | POA: Diagnosis present

## 2015-11-18 DIAGNOSIS — I10 Essential (primary) hypertension: Secondary | ICD-10-CM | POA: Diagnosis present

## 2015-11-18 DIAGNOSIS — J9621 Acute and chronic respiratory failure with hypoxia: Secondary | ICD-10-CM | POA: Diagnosis present

## 2015-11-18 DIAGNOSIS — B379 Candidiasis, unspecified: Secondary | ICD-10-CM | POA: Diagnosis present

## 2015-11-18 DIAGNOSIS — R0602 Shortness of breath: Secondary | ICD-10-CM | POA: Diagnosis present

## 2015-11-18 DIAGNOSIS — E870 Hyperosmolality and hypernatremia: Secondary | ICD-10-CM | POA: Diagnosis present

## 2015-11-18 DIAGNOSIS — Z87891 Personal history of nicotine dependence: Secondary | ICD-10-CM

## 2015-11-18 DIAGNOSIS — Z7982 Long term (current) use of aspirin: Secondary | ICD-10-CM | POA: Diagnosis not present

## 2015-11-18 DIAGNOSIS — E785 Hyperlipidemia, unspecified: Secondary | ICD-10-CM | POA: Diagnosis present

## 2015-11-18 DIAGNOSIS — E875 Hyperkalemia: Secondary | ICD-10-CM | POA: Diagnosis present

## 2015-11-18 DIAGNOSIS — R739 Hyperglycemia, unspecified: Secondary | ICD-10-CM | POA: Diagnosis present

## 2015-11-18 DIAGNOSIS — Z7952 Long term (current) use of systemic steroids: Secondary | ICD-10-CM

## 2015-11-18 DIAGNOSIS — Z9981 Dependence on supplemental oxygen: Secondary | ICD-10-CM | POA: Diagnosis not present

## 2015-11-18 DIAGNOSIS — Z681 Body mass index (BMI) 19 or less, adult: Secondary | ICD-10-CM

## 2015-11-18 DIAGNOSIS — Z8543 Personal history of malignant neoplasm of ovary: Secondary | ICD-10-CM | POA: Diagnosis not present

## 2015-11-18 DIAGNOSIS — J9620 Acute and chronic respiratory failure, unspecified whether with hypoxia or hypercapnia: Secondary | ICD-10-CM | POA: Diagnosis present

## 2015-11-18 DIAGNOSIS — M810 Age-related osteoporosis without current pathological fracture: Secondary | ICD-10-CM | POA: Diagnosis present

## 2015-11-18 DIAGNOSIS — J441 Chronic obstructive pulmonary disease with (acute) exacerbation: Secondary | ICD-10-CM | POA: Diagnosis present

## 2015-11-18 DIAGNOSIS — J310 Chronic rhinitis: Secondary | ICD-10-CM | POA: Diagnosis present

## 2015-11-18 DIAGNOSIS — E43 Unspecified severe protein-calorie malnutrition: Secondary | ICD-10-CM | POA: Diagnosis present

## 2015-11-18 DIAGNOSIS — J9622 Acute and chronic respiratory failure with hypercapnia: Secondary | ICD-10-CM | POA: Insufficient documentation

## 2015-11-18 LAB — CBC WITH DIFFERENTIAL/PLATELET
Basophils Absolute: 0 10*3/uL (ref 0.0–0.1)
Basophils Relative: 0 %
Eosinophils Absolute: 0.1 10*3/uL (ref 0.0–0.7)
Eosinophils Relative: 0 %
HCT: 36.6 % (ref 36.0–46.0)
Hemoglobin: 10.9 g/dL — ABNORMAL LOW (ref 12.0–15.0)
Lymphocytes Relative: 14 %
Lymphs Abs: 2.2 10*3/uL (ref 0.7–4.0)
MCH: 31.1 pg (ref 26.0–34.0)
MCHC: 29.8 g/dL — ABNORMAL LOW (ref 30.0–36.0)
MCV: 104.6 fL — ABNORMAL HIGH (ref 78.0–100.0)
Monocytes Absolute: 1.8 10*3/uL — ABNORMAL HIGH (ref 0.1–1.0)
Monocytes Relative: 11 %
Neutro Abs: 12 10*3/uL — ABNORMAL HIGH (ref 1.7–7.7)
Neutrophils Relative %: 75 %
Platelets: 216 10*3/uL (ref 150–400)
RBC: 3.5 MIL/uL — ABNORMAL LOW (ref 3.87–5.11)
RDW: 13.6 % (ref 11.5–15.5)
WBC: 16.2 10*3/uL — ABNORMAL HIGH (ref 4.0–10.5)

## 2015-11-18 LAB — BLOOD GAS, ARTERIAL
Acid-Base Excess: 10.2 mmol/L — ABNORMAL HIGH (ref 0.0–2.0)
Bicarbonate: 40.2 mEq/L — ABNORMAL HIGH (ref 20.0–24.0)
Drawn by: 295031
O2 Content: 8 L/min
O2 Saturation: 99.2 %
Patient temperature: 98.6
TCO2: 38.5 mmol/L (ref 0–100)
pCO2 arterial: 98.8 mmHg (ref 35.0–45.0)
pH, Arterial: 7.234 — ABNORMAL LOW (ref 7.350–7.450)
pO2, Arterial: 183 mmHg — ABNORMAL HIGH (ref 80.0–100.0)

## 2015-11-18 LAB — BASIC METABOLIC PANEL
Anion gap: 11 (ref 5–15)
BUN: 16 mg/dL (ref 6–20)
CO2: 39 mmol/L — ABNORMAL HIGH (ref 22–32)
Calcium: 9.3 mg/dL (ref 8.9–10.3)
Chloride: 92 mmol/L — ABNORMAL LOW (ref 101–111)
Creatinine, Ser: 0.5 mg/dL (ref 0.44–1.00)
GFR calc Af Amer: >60 mL/min (ref >60)
GFR calc non Af Amer: >60 mL/min (ref >60)
Glucose, Bld: 148 mg/dL — ABNORMAL HIGH (ref 65–99)
Potassium: 3.8 mmol/L (ref 3.5–5.1)
Sodium: 142 mmol/L (ref 135–145)

## 2015-11-18 LAB — MRSA PCR SCREENING: MRSA by PCR: NEGATIVE

## 2015-11-18 MED ORDER — POLYVINYL ALCOHOL 1.4 % OP SOLN: 1.0000 [drp] | Freq: Two times a day (BID) | OPHTHALMIC | Status: DC | PRN

## 2015-11-18 MED ORDER — IPRATROPIUM-ALBUTEROL 0.5-2.5 (3) MG/3ML IN SOLN
3.0000 mL | Freq: Four times a day (QID) | RESPIRATORY_TRACT | Status: DC
  Administered 2015-11-18 – 2015-11-22 (×13): 3 mL via RESPIRATORY_TRACT
  Filled 2015-11-18 (×15): qty 3

## 2015-11-18 MED ORDER — ARFORMOTEROL TARTRATE 15 MCG/2ML IN NEBU
15.0000 ug | INHALATION_SOLUTION | Freq: Two times a day (BID) | RESPIRATORY_TRACT | Status: DC
  Administered 2015-11-18 – 2015-11-22 (×10): 15 ug via RESPIRATORY_TRACT
  Filled 2015-11-18 (×15): qty 2

## 2015-11-18 MED ORDER — ASPIRIN 81 MG PO CHEW
324.0000 mg | CHEWABLE_TABLET | ORAL | Status: AC
  Filled 2015-11-18: qty 4

## 2015-11-18 MED ORDER — BUDESONIDE 0.25 MG/2ML IN SUSP
0.5000 mg | Freq: Two times a day (BID) | RESPIRATORY_TRACT | Status: DC
  Administered 2015-11-18 (×2): 0.5 mg via RESPIRATORY_TRACT
  Filled 2015-11-18 (×3): qty 4

## 2015-11-18 MED ORDER — HEPARIN SODIUM (PORCINE) 5000 UNIT/ML IJ SOLN
5000.0000 [IU] | Freq: Three times a day (TID) | INTRAMUSCULAR | Status: DC
Start: 2015-11-18 — End: 2015-11-25
  Administered 2015-11-18 – 2015-11-25 (×21): 5000 [IU] via SUBCUTANEOUS
  Filled 2015-11-18 (×23): qty 1

## 2015-11-18 MED ORDER — ALBUTEROL SULFATE (2.5 MG/3ML) 0.083% IN NEBU
2.5000 mg | INHALATION_SOLUTION | RESPIRATORY_TRACT | Status: DC | PRN
  Administered 2015-11-22: 2.5 mg via RESPIRATORY_TRACT
  Filled 2015-11-18 (×2): qty 3

## 2015-11-18 MED ORDER — IRBESARTAN 150 MG PO TABS
300.0000 mg | ORAL_TABLET | Freq: Every day | ORAL | Status: DC
Start: 2015-11-19 — End: 2015-11-25
  Administered 2015-11-19 – 2015-11-25 (×7): 300 mg via ORAL
  Filled 2015-11-18: qty 2
  Filled 2015-11-18: qty 1
  Filled 2015-11-18 (×4): qty 2
  Filled 2015-11-18 (×2): qty 1

## 2015-11-18 MED ORDER — ONDANSETRON HCL 4 MG/2ML IJ SOLN: 4.0000 mg | Freq: Four times a day (QID) | INTRAMUSCULAR | Status: DC | PRN

## 2015-11-18 MED ORDER — SODIUM CHLORIDE 0.9 % IV SOLN: 250.0000 mL | INTRAVENOUS | Status: DC | PRN

## 2015-11-18 MED ORDER — ATORVASTATIN CALCIUM 40 MG PO TABS
80.0000 mg | ORAL_TABLET | Freq: Every day | ORAL | Status: DC
  Administered 2015-11-21: 80 mg via ORAL
  Filled 2015-11-18 (×4): qty 2
  Filled 2015-11-18: qty 1
  Filled 2015-11-18 (×2): qty 2

## 2015-11-18 MED ORDER — METHYLPREDNISOLONE SODIUM SUCC 40 MG IJ SOLR
40.0000 mg | Freq: Two times a day (BID) | INTRAMUSCULAR | Status: DC
  Administered 2015-11-18 – 2015-11-23 (×10): 40 mg via INTRAVENOUS
  Filled 2015-11-18 (×10): qty 1

## 2015-11-18 MED ORDER — AMLODIPINE BESYLATE 5 MG PO TABS
2.5000 mg | ORAL_TABLET | Freq: Every morning | ORAL | Status: DC
  Administered 2015-11-19 – 2015-11-25 (×7): 2.5 mg via ORAL
  Filled 2015-11-18 (×7): qty 1

## 2015-11-18 MED ORDER — ASPIRIN 300 MG RE SUPP
300.0000 mg | RECTAL | Status: AC
  Administered 2015-11-18: 300 mg via RECTAL
  Filled 2015-11-18 (×2): qty 1

## 2015-11-18 MED ORDER — LORAZEPAM 2 MG/ML IJ SOLN
0.2500 mg | Freq: Once | INTRAMUSCULAR | Status: AC
  Administered 2015-11-18: 0.25 mg via INTRAVENOUS
  Filled 2015-11-18: qty 1

## 2015-11-18 MED ORDER — METHYLPREDNISOLONE SODIUM SUCC 125 MG IJ SOLR
80.0000 mg | Freq: Once | INTRAMUSCULAR | Status: AC
  Administered 2015-11-18: 80 mg via INTRAVENOUS
  Filled 2015-11-18: qty 2

## 2015-11-18 MED ORDER — ALPRAZOLAM 0.25 MG PO TABS
0.2500 mg | ORAL_TABLET | Freq: Every evening | ORAL | Status: DC | PRN
  Administered 2015-11-18: 0.25 mg via ORAL
  Filled 2015-11-18: qty 1

## 2015-11-18 MED ORDER — CALCIUM CARBONATE 1500 (600 CA) MG PO TABS
1.0000 | ORAL_TABLET | Freq: Two times a day (BID) | ORAL | Status: DC
  Filled 2015-11-18 (×16): qty 1

## 2015-11-18 MED ORDER — AZITHROMYCIN 500 MG IV SOLR
500.0000 mg | INTRAVENOUS | Status: DC
  Administered 2015-11-18: 500 mg via INTRAVENOUS
  Filled 2015-11-18: qty 500

## 2015-11-18 MED ORDER — ALBUTEROL (5 MG/ML) CONTINUOUS INHALATION SOLN
15.0000 mg/h | INHALATION_SOLUTION | RESPIRATORY_TRACT | Status: DC
  Administered 2015-11-18: 15 mg/h via RESPIRATORY_TRACT
  Filled 2015-11-18: qty 20

## 2015-11-18 MED ORDER — ADULT MULTIVITAMIN W/MINERALS CH
1.0000 | ORAL_TABLET | Freq: Every day | ORAL | Status: DC
  Administered 2015-11-21: 1 via ORAL
  Filled 2015-11-18 (×5): qty 1

## 2015-11-18 MED ORDER — DEXTROSE-NACL 5-0.45 % IV SOLN
INTRAVENOUS | Status: DC
  Administered 2015-11-18 – 2015-11-20 (×3): via INTRAVENOUS

## 2015-11-18 MED ORDER — HYDRALAZINE HCL 20 MG/ML IJ SOLN
10.0000 mg | INTRAMUSCULAR | Status: DC | PRN
  Administered 2015-11-18: 20 mg via INTRAVENOUS
  Administered 2015-11-19: 10 mg via INTRAVENOUS
  Filled 2015-11-18 (×2): qty 1

## 2015-11-18 NOTE — Progress Notes (Signed)
Pt seen and given scheduled hhn without incident.  Pt tachypneic ([id number]) with some increase in wob and anxiety, but she does not want to put bipap back on at this time.  Pt remains on 4lnc.  RT will continue to monitor and assess pt for bipap needs.

## 2015-11-18 NOTE — ED Notes (Signed)
Bed: WA09 Expected date:  Expected time:  Means of arrival:  Comments: EMS 87F resp distress CPAP

## 2015-11-18 NOTE — ED Provider Notes (Signed)
CSN: [id number]     Arrival date & time 11/18/15  0741 History   First MD Initiated Contact with Patient 11/18/15 0749     Chief Complaint  Patient presents with  . Shortness of Breath     (Consider location/radiation/quality/duration/timing/severity/associated sxs/prior Treatment) HPI   75yF with dyspnea. Former smoker (30 pack years) with a history of hypertension, ovarian cancer, and COPD with chronic hypoxemic respiratory failure that has be followed by Dr Gwenette Greet. She is on Spiriva + Symbicort, pred 10mg , O2 at 2L/min at all times. She seen by pulmonology 2 days ago. She started on increased dose of prednisone and azithromycin. Despite this, symptoms worsening over this morning. She reports feeling more short of breath. Denies any acute pain. No fever. No unusual swelling.  Past Medical History  Diagnosis Date  . Emphysema   . 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 Hyperlipidemia   . Hypertension   . Blood transfusion without reported diagnosis   . Emphysema of lung ([place])   . Osteoporosis   . SBO (small bowel obstruction) s/p LOA 11/07/2012 11/04/2012  . COPD (chronic obstructive pulmonary disease) with emphysema ([place]) 11/05/2012    [facility] 2001:  FEV1 0.50, ratio 28 Pulmonary rehab 2013   . Chronic respiratory failure ([place]) 07/15/2014   Past Surgical History  Procedure Laterality Date  . Abdominal hysterectomy    . Laparoscopy  11/07/2012    Procedure: LAPAROSCOPY DIAGNOSTIC;  Surgeon: Ralene Ok, MD;  Location: [facility];  Service: General;  Laterality: N/A;  . Laparotomy  11/07/2012    Procedure: EXPLORATORY LAPAROTOMY;  Surgeon: Ralene Ok, MD;  Location: [facility];  Service: General;  Laterality: N/A;  . Laparoscopic lysis of adhesions  11/07/2012    Procedure: LAPAROSCOPIC LYSIS OF ADHESIONS;  Surgeon: Ralene Ok, MD;  Location: [facility];  Service: General;  Laterality: N/A;  . Lysis of adhesion  11/07/2012    Procedure: LYSIS OF ADHESION;  Surgeon: Ralene Ok, MD;  Location:  [facility];  Service: General;;   Family History  Problem Relation Age of Onset  . Heart failure Mother   . Heart attack Father   . Heart attack Paternal Grandmother    Social History  Substance Use Topics  . Smoking status: Former Smoker -- 1.00 packs/day for 30 years    Types: Cigarettes    Quit date: 11/06/1992  . Smokeless tobacco: Never Used  . Alcohol Use: 0.6 oz/week    1 Glasses of wine per week   OB History    No data available     Review of Systems  All systems reviewed and negative, other than as noted in HPI.   Allergies  Oxycodone-acetaminophen  Home Medications   Prior to Admission medications   Medication Sig Start Date End Date Taking? Authorizing Provider  albuterol (PROAIR HFA) 108 (90 BASE) MCG/ACT inhaler Inhale 2 puffs into the lungs every 6 (six) hours as needed for wheezing or shortness of breath. 12/03/14   Venetia Maxon Rama, MD  albuterol (PROVENTIL) (2.5 MG/3ML) 0.083% nebulizer solution Take 3 mLs (2.5 mg total) by nebulization every 6 (six) hours as needed. Dx: J43.8 08/16/15 08/15/16  Collene Gobble, MD  ALPRAZolam Duanne Moron) 0.25 MG tablet Take 1 tablet (0.25 mg total) by mouth at bedtime as needed for sleep. 12/03/14   Venetia Maxon Rama, MD  aspirin 81 MG tablet Take 81 mg by mouth daily.      Historical Provider, MD  atorvastatin (LIPITOR) 80 MG tablet Take 80 mg  by mouth daily.      Historical Provider, MD  azithromycin (ZITHROMAX Z-PAK) 250 MG tablet Take 2 tablets (500 mg) on  Day 1,  followed by 1 tablet (250 mg) once daily on Days 2 through 5. 11/16/15 11/21/15  Collene Gobble, MD  BENICAR 40 MG tablet Take 40 mg by mouth daily.     Historical Provider, MD  benzonatate (TESSALON) 100 MG capsule Take 1 capsule (100 mg total) by mouth 3 (three) times daily as needed for cough. 03/16/15   Samuella Cota, MD  budesonide-formoterol (SYMBICORT) 160-4.5 MCG/ACT inhaler INHALE 2 PUFFS INTO THE LUNGS 2 (TWO) TIMES DAILY. 03/23/15   Kathee Delton, MD  Calcium  Carbonate (CALTRATE 600) 1500 MG TABS Take 1 tablet by mouth 2 (two) times daily.      Historical Provider, MD  Lutein 20 MG CAPS Take 20 mg by mouth daily.     Historical Provider, MD  Multiple Vitamin (MULTIVITAMIN WITH MINERALS) TABS tablet Take 1 tablet by mouth daily.    Historical Provider, MD  Omega-3 Fatty Acids (FISH OIL) 1000 MG CAPS Take 1,000 mg by mouth 2 (two) times daily.     Historical Provider, MD  Polyethyl Glycol-Propyl Glycol (SYSTANE) 0.4-0.3 % SOLN Apply 1 drop to eye 2 (two) times daily as needed (for dryness).     Historical Provider, MD  predniSONE (DELTASONE) 10 MG tablet 10 mg daily. 07/30/15   Collene Gobble, MD  predniSONE (DELTASONE) 10 MG tablet Take 4 tablets for 3 days, 3 tablets for 3 days, 2 tablets for 3 days, 1 tablet for 3 days 11/16/15   Collene Gobble, MD  tiotropium (SPIRIVA) 18 MCG inhalation capsule Place 1 capsule (18 mcg total) into inhaler and inhale daily. 03/23/15   Kathee Delton, MD   BP 178/83 mmHg  Pulse 105  Resp 25  SpO2 100% Physical Exam  Constitutional: She appears well-developed and well-nourished. No distress.  Laying in bed. Cachectic/frail appearing. Respiratory distress  HENT:  Head: Normocephalic and atraumatic.  Eyes: Conjunctivae are normal. Right eye exhibits no discharge. Left eye exhibits no discharge.  Neck: Neck supple.  Cardiovascular: Normal rate, regular rhythm and normal heart sounds.  Exam reveals no gallop and no friction rub.   No murmur heard. Pulmonary/Chest: She is in respiratory distress. She has wheezes.  Accessory muscle usage.  Abdominal: Soft. She exhibits no distension. There is no tenderness.  Musculoskeletal: She exhibits edema. She exhibits no tenderness.  Minimal, symmetric lower extremity edema. No Tenderness.  Neurological: She is alert.  Skin: Skin is warm and dry.  Psychiatric: She has a normal mood and affect. Her behavior is normal. Thought content normal.  Nursing note and vitals  reviewed.   ED Course  Procedures (including critical care time)  CRITICAL CARE Performed by: Virgel Manifold Total critical care time: 30  minutes Critical care time was exclusive of separately billable procedures and treating other patients. Critical care was necessary to treat or prevent imminent or life-threatening deterioration. Critical care was time spent personally by me on the following activities: development of treatment plan with patient and/or surrogate as well as nursing, discussions with consultants, evaluation of patient's response to treatment, examination of patient, obtaining history from patient or surrogate, ordering and performing treatments and interventions, ordering and review of laboratory studies, ordering and review of radiographic studies, pulse oximetry and re-evaluation of patient's condition.  Labs Review Labs Reviewed  CBC WITH DIFFERENTIAL/PLATELET - Abnormal; Notable for the following:  WBC 16.2 (*)    RBC 3.50 (*)    Hemoglobin 10.9 (*)    MCV 104.6 (*)    MCHC 29.8 (*)    Neutro Abs 12.0 (*)    Monocytes Absolute 1.8 (*)    All other components within normal limits  BASIC METABOLIC PANEL - Abnormal; Notable for the following:    Chloride 92 (*)    CO2 39 (*)    Glucose, Bld 148 (*)    All other components within normal limits  BLOOD GAS, ARTERIAL - Abnormal; Notable for the following:    pH, Arterial 7.234 (*)    pCO2 arterial 98.8 (*)    pO2, Arterial 183 (*)    Bicarbonate 40.2 (*)    Acid-Base Excess 10.2 (*)    All other components within normal limits    Imaging Review Dg Chest Portable 1 View  11/18/2015  CLINICAL DATA:  Shortness of breath for 3 days. Decreased oxygen saturation EXAM: PORTABLE CHEST 1 VIEW COMPARISON:  Mar 14, 2015 FINDINGS: Lungs remain somewhat hyperexpanded. There is no edema or consolidation. The heart size and pulmonary vascularity within normal limits. No adenopathy. There is atherosclerotic calcification in  aorta. No bone lesions. IMPRESSION: Lungs hyperexpanded without edema or consolidation. Electronically Signed   By: Lowella Grip III M.D.   On: 11/18/2015 08:19   I have personally reviewed and evaluated these images and lab results as part of my medical decision-making.   EKG Interpretation   Date/Time:  Thursday November 18 2015 08:16:09 EST Ventricular Rate:  102 PR Interval:  127 QRS Duration: 88 QT Interval:  327 QTC Calculation: 426 R Axis:   49 Text Interpretation:  Sinus tachycardia Interpretation limited secondary  to artifact Confirmed by Deundra Furber  MD, Leyli Kevorkian ([id number]) on 11/18/2015 9:11:32 AM      MDM   Final diagnoses:  Acute on chronic respiratory failure with hypercapnia ([place])    75 year old female with respiratory distress. Acute on chronic respiratory failure. Dyspnea worsening despite recently increasing steroids and started azithromycin. She arrived on CPAP. Will continue noninvasive positive pressure ventilation. Will check an ABG. Continue nebs.  PCO2 almost 100. Pt is alert and talking though. Remains on bipap with inline neb. Repeat gas. Admission.     Virgel Manifold, MD 11/18/15 [phone]

## 2015-11-18 NOTE — H&P (Deleted)
PULMONARY / CRITICAL CARE MEDICINE                *NOTE IS FOR TEACHING PURPOSES ONLY. PLEASE REFER TO ATTENDING NOTE*   Name: [name] MRN: [id number] DOB: 25-Aug-1941    ADMISSION DATE:  11/18/2015 CONSULTATION DATE:  11/18/2015  REFERRING MD:  ED Physician, Dr. Wilson Singer  CHIEF COMPLAINT:  SOB  HISTORY OF PRESENT ILLNESS:   [name] is a 75 y/o female with PMH of COPD with chronic hypoxemic respiratory failure on 2L Home O2 followed by Dr. Gwenette Miranda, former smoker, HTN, HLD and osteoporosis who presented to the ED via EMS with complaints of progressively worsening SOB x 3 days.   She reports that over the weekend she began feeling more short of breath and was using her nebulizers more frequently with minimal relief. She also reports minimally productive cough. Denies fever, chills. On Monday 1/9 she started herself on a steroid taper she had at home and was seen in the office on 1/10 by Dr. Lamonte Miranda. At that time he prescribed azithromycin and advised her to continue prednisone taper.   Patient states that this morning her SOB continued to worsen despite nebulizer treatments and at that time she called EMS. En route to the ED saturations were 84-90% and she was placed on CPAP. Upon arrival to the ED she was in moderate respiratory distress and placed on Bipap.   Initial labs in the ED revealed significant respiratory acidosis with pH 7.234 and pC02 98. WBC 16.2. CXR with hyperexpansion, no evidence of infiltrate. PCCM was consulted and patient was admitted to stepdown.    PAST MEDICAL HISTORY :  She  has a past medical history of Emphysema; Ovarian cancer on left [facility]); Hyperlipidemia; Hypertension; Blood transfusion without reported diagnosis; Emphysema of lung ([place]); Osteoporosis; SBO (small bowel obstruction) s/p LOA 11/07/2012 (11/04/2012); COPD (chronic obstructive pulmonary disease) with emphysema (HCC) (11/05/2012); and Chronic respiratory failure ([place]) (07/15/2014).  PAST SURGICAL  HISTORY: She  has past surgical history that includes Abdominal hysterectomy; laparoscopy (11/07/2012); laparotomy (11/07/2012); Laparoscopic lysis of adhesions (11/07/2012); and Lysis of adhesion (11/07/2012).  Allergies  Allergen Reactions  . Oxycodone-Acetaminophen Nausea Only and Other (See Comments)    Headaches     No current facility-administered medications on file prior to encounter.   Current Outpatient Prescriptions on File Prior to Encounter  Medication Sig  . albuterol (PROAIR HFA) 108 (90 BASE) MCG/ACT inhaler Inhale 2 puffs into the lungs every 6 (six) hours as needed for wheezing or shortness of breath.  Marland Kitchen albuterol (PROVENTIL) (2.5 MG/3ML) 0.083% nebulizer solution Take 3 mLs (2.5 mg total) by nebulization every 6 (six) hours as needed. Dx: J43.8  . ALPRAZolam (XANAX) 0.25 MG tablet Take 1 tablet (0.25 mg total) by mouth at bedtime as needed for sleep.  Marland Kitchen aspirin 81 MG tablet Take 81 mg by mouth daily.    Marland Kitchen atorvastatin (LIPITOR) 80 MG tablet Take 80 mg by mouth daily.    Marland Kitchen azithromycin (ZITHROMAX Z-PAK) 250 MG tablet Take 2 tablets (500 mg) on  Day 1,  followed by 1 tablet (250 mg) once daily on Days 2 through 5.  . BENICAR 40 MG tablet Take 40 mg by mouth daily.   . benzonatate (TESSALON) 100 MG capsule Take 1 capsule (100 mg total) by mouth 3 (three) times daily as needed for cough.  . budesonide-formoterol (SYMBICORT) 160-4.5 MCG/ACT inhaler INHALE 2 PUFFS INTO THE LUNGS 2 (TWO) TIMES DAILY.  . Calcium Carbonate (CALTRATE 600) 1500  MG TABS Take 1 tablet by mouth 2 (two) times daily.    . Lutein 20 MG CAPS Take 20 mg by mouth daily.   . Multiple Vitamin (MULTIVITAMIN WITH MINERALS) TABS tablet Take 1 tablet by mouth daily.  . Omega-3 Fatty Acids (FISH OIL) 1000 MG CAPS Take 1,000 mg by mouth 2 (two) times daily.   Vladimir Faster Glycol-Propyl Glycol (SYSTANE) 0.4-0.3 % SOLN Apply 1 drop to eye 2 (two) times daily as needed (for dryness).   . predniSONE (DELTASONE) 10 MG tablet 10  mg daily.  . predniSONE (DELTASONE) 10 MG tablet Take 4 tablets for 3 days, 3 tablets for 3 days, 2 tablets for 3 days, 1 tablet for 3 days  . tiotropium (SPIRIVA) 18 MCG inhalation capsule Place 1 capsule (18 mcg total) into inhaler and inhale daily.    FAMILY HISTORY:  Her has no family status information on file.   SOCIAL HISTORY: She  reports that she quit smoking about 23 years ago. Her smoking use included Cigarettes. She has a 30 pack-year smoking history. She has never used smokeless tobacco. She reports that she drinks about 0.6 oz of alcohol per week. She reports that she does not use illicit drugs.  REVIEW OF SYSTEMS:   Review of Systems  Constitutional: Positive for malaise/fatigue. Negative for fever and chills.  Respiratory: Positive for cough, shortness of breath and wheezing. Negative for sputum production.   Cardiovascular: Positive for leg swelling. Negative for chest pain and palpitations.  Gastrointestinal: Negative for nausea, vomiting and diarrhea.  Skin: Negative for rash.  Neurological: Negative for headaches.    SUBJECTIVE:  Labored work of breathing  Anxious   VITAL SIGNS: BP 178/83 mmHg  Pulse 105  Temp(Src) 97.4 F (36.3 C) (Rectal)  Resp 25  SpO2 100%  HEMODYNAMICS:    VENTILATOR SETTINGS: Vent Mode:  [-] BIPAP FiO2 (%):  [40 %] 40 % Set Rate:  [14 bmp] 14 bmp PEEP:  [6 cmH20-7 cmH20] 6 cmH20  INTAKE / OUTPUT:    PHYSICAL EXAMINATION: General:  Frail chronically ill appearing female, lying upright in bed, labored WOB  Neuro:  A&O, anxious, no deficits.  HEENT:  NCAT, pupils equal and reactive, BiPap in place.  Cardiovascular:  RRR, no murmurs or gallops  Lungs:  Decreased breath sounds bilaterally with moderated expiratory wheezes  Abdomen:  Soft, non-tender. Bowel sounds present.  Musculoskeletal:  Moves all extremities. 1+ edema right lower extremity.  Skin:  No rashes.   LABS:  BMET  Recent Labs Lab 11/18/15 0820  NA 142   K 3.8  CL 92*  CO2 39*  BUN 16  CREATININE 0.50  GLUCOSE 148*    Electrolytes  Recent Labs Lab 11/18/15 0820  CALCIUM 9.3    CBC  Recent Labs Lab 11/18/15 0820  WBC 16.2*  HGB 10.9*  HCT 36.6  PLT 216    Coag's No results for input(s): APTT, INR in the last 168 hours.  Sepsis Markers No results for input(s): LATICACIDVEN, PROCALCITON, O2SATVEN in the last 168 hours.  ABG  Recent Labs Lab 11/18/15 0820  PHART 7.234*  PCO2ART 98.8*  PO2ART 183*    Liver Enzymes No results for input(s): AST, ALT, ALKPHOS, BILITOT, ALBUMIN in the last 168 hours.  Cardiac Enzymes No results for input(s): TROPONINI, PROBNP in the last 168 hours.  Glucose No results for input(s): GLUCAP in the last 168 hours.  Imaging Dg Chest Portable 1 View  11/18/2015  CLINICAL DATA:  Shortness of breath  for 3 days. Decreased oxygen saturation EXAM: PORTABLE CHEST 1 VIEW COMPARISON:  Mar 14, 2015 FINDINGS: Lungs remain somewhat hyperexpanded. There is no edema or consolidation. The heart size and pulmonary vascularity within normal limits. No adenopathy. There is atherosclerotic calcification in aorta. No bone lesions. IMPRESSION: Lungs hyperexpanded without edema or consolidation. Electronically Signed   By: Lowella Grip III M.D.   On: 11/18/2015 08:19     STUDIES:    CULTURES: Sputum 1/12 >>  ANTIBIOTICS:   SIGNIFICANT EVENTS:   LINES/TUBES:   DISCUSSION: 75 y/o lady with acute on chronic hypercapnic respiratory failure due to COPD. Will continue Bipap, steroids, nebulizers and start antibiotics.    ASSESSMENT / PLAN:  PULMONARY A: Acute on Chronic Hypercapnic Respiratory Failure - due to COPD, on home O2 @ 2L  P:   BiPap PRN for increased WOB Albuterol q6 hrs PRN  Pulmicort BID  Methylprednisolone 40 mg IV q12 hours  Continue Azithromycin 500 mg Qdaily  Repeat ABG *     CARDIOVASCULAR A:  Hypertension - BP elevated at 178/83 on admission   Hyperlipidemia  P:  Continue Benicar 40 mg QD   Continue Atorvastatin 80 mg QD PRN Hydralazine   RENAL A:   History of hypocalcemia - calcium 9.3 on admission  No acute issues  P:   Monitor BMP daily  Replete electrolytes as needed  Continue Calcium 1500 mg BID   GASTROINTESTINAL A:   Protein Calorie Malnutrition   P:   Ensure BID  Nutrition Consult     HEMATOLOGIC A:   Mild macrocytic anemia - unclear etiology  P:  Trend CBC  Check folate, B12  INFECTIOUS A:   Leukocytosis - no infiltrates on CXR, may be attributable to steroid use x 4 days  P:   Monitor CBC  Sputum culture    ENDOCRINE A:   No acute issues  P:   Monitor for hypoglycemia if NPO   NEUROLOGIC A:   Alert and oriented, no acute issues  P:   RASS goal: N/A      FAMILY  - Updates: Discussed plan of care with friend at bedside.   - Inter-disciplinary family meet or Palliative Care meeting due by:  N/A    Elita Boone, PA-S 11/18/2015, 10:24 AM

## 2015-11-18 NOTE — ED Notes (Addendum)
Per EMS, pt c/o shortness of breath.  2-3 days with inhaler and neb not helping.  Original sats 84%. Marland Kitchen  Pt placed on cpap with treatment in route.  Sats up in 90%  Normally baseline [place] at 2L .  Baseline sats usually in mid 90's.  Pt is alert and oriented.  Pt lives at home.  Vitals: 81, hr 174/88, 99% cpap, resp 24, cbg 126.  IV 20g LAC.  Pt took home prednisone.  Given 10 mg albuterol and 1mg  atrovent.in route.  Pt also had tried breathing treatment at home.

## 2015-11-18 NOTE — Progress Notes (Signed)
Pt is resting comfortably, no respiratory distress noted or voiced by pt at this time.  HR87, RR20, spo2 98% on 4lnc.  Weaned O2 down to 3lnc.  Bipap not indicated at this time.  In addition, pt states she doesn't want to wear it.  RT will continue to monitor and assess pt.

## 2015-11-18 NOTE — H&P (Signed)
PULMONARY / CRITICAL CARE MEDICINE   Name: [name] MRN: [id number] DOB: 11/22/1940    ADMISSION DATE:  11/18/2015 CONSULTATION DATE:  11/18/15  REFERRING MD:  Dr. Wilson Singer / EDP   CHIEF COMPLAINT:  AECOPD   HISTORY OF PRESENT ILLNESS:   75 y/o, former smoker (quit 30+ years ago), with PMH of HTN, HLD, osteoporosis, remote ovarian CA and 2L O2 dependent COPD (followed by Dr. Lamonte Sakai, 2001 FEV1 of 0.5L or 23% prediced) who presented to [facility] on 1/12 with a three day history of worsening shortness of breath.  The patient reports she began feeling more short of breath the weekend prior to admission.  She attempted to use home nebulizer's with minimal relief.  She also noted a non-productive cough.  Denies fevers, chills, sputum production.  The patient started herself on a prednisone taper on 1/9 without improvement.  She was seen by Dr. Lamonte Sakai for an acute visit on 1/10 and prescribed azithromycin and instructed to complete her prednisone taper.   The patient returned home but continued to worsen in regards to shortness of breath.  Am of presentation on 1/12, she woke with increased symptoms and activated EMS.  EMS evaluation notable for saturations of 84-90%.  She was treated with albuterol / atrovent and CPAP.  On arrival to ER, she was in respiratory distress.  Patient received solumedrol, additional atrovent and BiPAP.  ABG evaluation 7.234 and pCO2 of 98.  WBC 16.2 (prior steroids).  CXR evaluation consistent with changes related to COPD without acute infiltrate.    PCCM called for admission.   PAST MEDICAL HISTORY :  She  has a past medical history of Emphysema; Ovarian cancer on left [facility]); Hyperlipidemia; Hypertension; Blood transfusion without reported diagnosis; Emphysema of lung ([place]); Osteoporosis; SBO (small bowel obstruction) s/p LOA 11/07/2012 (11/04/2012); COPD (chronic obstructive pulmonary disease) with emphysema (HCC) (11/05/2012); and Chronic respiratory failure ([place])  (07/15/2014).  PAST SURGICAL HISTORY: She  has past surgical history that includes Abdominal hysterectomy; laparoscopy (11/07/2012); laparotomy (11/07/2012); Laparoscopic lysis of adhesions (11/07/2012); and Lysis of adhesion (11/07/2012).  Allergies  Allergen Reactions  . Oxycodone-Acetaminophen Nausea Only and Other (See Comments)    Headaches     No current facility-administered medications on file prior to encounter.   Current Outpatient Prescriptions on File Prior to Encounter  Medication Sig  . albuterol (PROAIR HFA) 108 (90 BASE) MCG/ACT inhaler Inhale 2 puffs into the lungs every 6 (six) hours as needed for wheezing or shortness of breath.  Marland Kitchen albuterol (PROVENTIL) (2.5 MG/3ML) 0.083% nebulizer solution Take 3 mLs (2.5 mg total) by nebulization every 6 (six) hours as needed. Dx: J43.8  . ALPRAZolam (XANAX) 0.25 MG tablet Take 1 tablet (0.25 mg total) by mouth at bedtime as needed for sleep.  Marland Kitchen aspirin 81 MG tablet Take 81 mg by mouth daily.    Marland Kitchen atorvastatin (LIPITOR) 80 MG tablet Take 80 mg by mouth daily.    Marland Kitchen azithromycin (ZITHROMAX Z-PAK) 250 MG tablet Take 2 tablets (500 mg) on  Day 1,  followed by 1 tablet (250 mg) once daily on Days 2 through 5.  . BENICAR 40 MG tablet Take 40 mg by mouth daily.   . benzonatate (TESSALON) 100 MG capsule Take 1 capsule (100 mg total) by mouth 3 (three) times daily as needed for cough.  . budesonide-formoterol (SYMBICORT) 160-4.5 MCG/ACT inhaler INHALE 2 PUFFS INTO THE LUNGS 2 (TWO) TIMES DAILY.  . Calcium Carbonate (CALTRATE 600) 1500 MG TABS Take 1 tablet by mouth  2 (two) times daily.    . Lutein 20 MG CAPS Take 20 mg by mouth daily.   . Multiple Vitamin (MULTIVITAMIN WITH MINERALS) TABS tablet Take 1 tablet by mouth daily.  . Omega-3 Fatty Acids (FISH OIL) 1000 MG CAPS Take 1,000 mg by mouth 2 (two) times daily.   Vladimir Faster Glycol-Propyl Glycol (SYSTANE) 0.4-0.3 % SOLN Apply 1 drop to eye 2 (two) times daily as needed (for dryness).   . predniSONE  (DELTASONE) 10 MG tablet 10 mg daily.  . predniSONE (DELTASONE) 10 MG tablet Take 4 tablets for 3 days, 3 tablets for 3 days, 2 tablets for 3 days, 1 tablet for 3 days  . tiotropium (SPIRIVA) 18 MCG inhalation capsule Place 1 capsule (18 mcg total) into inhaler and inhale daily.    FAMILY HISTORY:  Her has no family status information on file.   SOCIAL HISTORY: She  reports that she quit smoking about 23 years ago. Her smoking use included Cigarettes. She has a 30 pack-year smoking history. She has never used smokeless tobacco. She reports that she drinks about 0.6 oz of alcohol per week. She reports that she does not use illicit drugs.  REVIEW OF SYSTEMS:  Unable to complete as patient is currently on BiPAP.   SUBJECTIVE:    VITAL SIGNS: BP 178/83 mmHg  Pulse 105  Temp(Src) 97.4 F (36.3 C) (Rectal)  Resp 25  SpO2 100%  HEMODYNAMICS:    VENTILATOR SETTINGS: Vent Mode:  [-] BIPAP FiO2 (%):  [40 %] 40 % Set Rate:  [14 bmp] 14 bmp PEEP:  [6 cmH20-7 cmH20] 6 cmH20  INTAKE / OUTPUT:    PHYSICAL EXAMINATION: General:  Frail elderly female on BiPAP Neuro:  Anxious, AAOx4, speech clear but difficult to understand with BiPAP, MAE HEENT:  MM pink/dry, mask in place Cardiovascular:  s1s2 rrr, no m/r/g Lungs:  Prolonged exp phase, distant breath sounds, decreased air movement, no wheeze Abdomen:  Soft, non-tender, BSx4 active  Musculoskeletal:  No acute deformities Skin:  Warm/dry, thin skin, scattered ecchymosis on upper extremities, BLE symmetrical pedal edema  LABS:  BMET  Recent Labs Lab 11/18/15 0820  NA 142  K 3.8  CL 92*  CO2 39*  BUN 16  CREATININE 0.50  GLUCOSE 148*    Electrolytes  Recent Labs Lab 11/18/15 0820  CALCIUM 9.3    CBC  Recent Labs Lab 11/18/15 0820  WBC 16.2*  HGB 10.9*  HCT 36.6  PLT 216    Coag's No results for input(s): APTT, INR in the last 168 hours.  Sepsis Markers No results for input(s): LATICACIDVEN,  PROCALCITON, O2SATVEN in the last 168 hours.  ABG  Recent Labs Lab 11/18/15 0820  PHART 7.234*  PCO2ART 98.8*  PO2ART 183*    Liver Enzymes No results for input(s): AST, ALT, ALKPHOS, BILITOT, ALBUMIN in the last 168 hours.  Cardiac Enzymes No results for input(s): TROPONINI, PROBNP in the last 168 hours.  Glucose No results for input(s): GLUCAP in the last 168 hours.  Imaging Dg Chest Portable 1 View  11/18/2015  CLINICAL DATA:  Shortness of breath for 3 days. Decreased oxygen saturation EXAM: PORTABLE CHEST 1 VIEW COMPARISON:  Mar 14, 2015 FINDINGS: Lungs remain somewhat hyperexpanded. There is no edema or consolidation. The heart size and pulmonary vascularity within normal limits. No adenopathy. There is atherosclerotic calcification in aorta. No bone lesions. IMPRESSION: Lungs hyperexpanded without edema or consolidation. Electronically Signed   By: Lowella Grip III M.D.   On:  11/18/2015 08:19     STUDIES:    CULTURES: Sputum 1/12 >>   ANTIBIOTICS: Azithromycin 1/10 (on as outpatient) >>   SIGNIFICANT EVENTS: 01/12  Admit with AECOPD  LINES/TUBES:   DISCUSSION: 75 y/o F with severe COPD admitteed on 1/12 with acute on chronic hypercapic respiratory failure secondary to AECOPD requiring bipap.  ASSESSMENT / PLAN:  PULMONARY A: Acute on Chronic Hypercapnic Respiratory Failure secondary to AECOPD Severe COPD - baseline 2L O2 dependent  P:   Admit SDU  Solumedrol 40 mg IV BID BiPAP PRN for increased WOB Allow break from BiPAP later in pm  Brovana + Pulmicort Q12 Q6 duonebs + PRN albuterol  ? If she needs transition to nebulized medications at home, will need to discuss with patient Hold further ABG's unless mental status change  CARDIOVASCULAR A:  Hx HTN, HLD - EKG normal on admit P:  SDU monitoring  Continue benicar, atorvastatin, ASA, norvasc PRN hydralazine for SBP > 170  RENAL A:   Hx Hypocalcemia - normal on admit  P:   Trend BMP /  UOP  Replace electrolytes as indicated  Continue calcium   GASTROINTESTINAL A:   Protein Calorie Malnutrition  P:   NPO while on BiPAP Ensure BID  Nutrition consult   HEMATOLOGIC A:   Anemia - macrocytic  P:  Assess B12 Trend CBC Heparin for DVT prophylaxis   INFECTIOUS A:   AECOPD  P:   ABX as above Follow cultures   ENDOCRINE A:   Hyperglycemia - no hx DM P:   Monitor glucose on BMP   NEUROLOGIC A:   Anxiety  P:   RASS goal: n/a Continue PRN xanax   FAMILY  - Updates:  Family updated at bedside per Dr. Elsworth Soho  - Inter-disciplinary family meet or Palliative Care meeting due by:  1/19     Noe Gens, NP-C [facility] Pgr: [phone] or if no answer [phone] 11/18/2015, 11:21 AM

## 2015-11-19 ENCOUNTER — Inpatient Hospital Stay (HOSPITAL_COMMUNITY): Payer: Medicare Other

## 2015-11-19 DIAGNOSIS — J9621 Acute and chronic respiratory failure with hypoxia: Secondary | ICD-10-CM

## 2015-11-19 LAB — CBC
HEMATOCRIT: 33.6 % — AB (ref 36.0–46.0)
Hemoglobin: 10 g/dL — ABNORMAL LOW (ref 12.0–15.0)
MCH: 30.8 pg (ref 26.0–34.0)
MCHC: 29.8 g/dL — ABNORMAL LOW (ref 30.0–36.0)
MCV: 103.4 fL — AB (ref 78.0–100.0)
PLATELETS: 190 10*3/uL (ref 150–400)
RBC: 3.25 MIL/uL — AB (ref 3.87–5.11)
RDW: 13.7 % (ref 11.5–15.5)
WBC: 8.8 10*3/uL (ref 4.0–10.5)

## 2015-11-19 LAB — BASIC METABOLIC PANEL
ANION GAP: 7 (ref 5–15)
BUN: 17 mg/dL (ref 6–20)
CHLORIDE: 97 mmol/L — AB (ref 101–111)
CO2: 39 mmol/L — AB (ref 22–32)
Calcium: 9.1 mg/dL (ref 8.9–10.3)
Creatinine, Ser: 0.53 mg/dL (ref 0.44–1.00)
GFR calc Af Amer: >60 mL/min (ref >60)
GFR calc non Af Amer: >60 mL/min (ref >60)
GLUCOSE: 172 mg/dL — AB (ref 65–99)
POTASSIUM: 4.3 mmol/L (ref 3.5–5.1)
Sodium: 143 mmol/L (ref 135–145)

## 2015-11-19 LAB — VITAMIN B12: VITAMIN B 12: 717 pg/mL (ref 180–914)

## 2015-11-19 MED ORDER — LEVOFLOXACIN 500 MG PO TABS
500.0000 mg | ORAL_TABLET | Freq: Once | ORAL | Status: AC
  Administered 2015-11-19: 500 mg via ORAL
  Filled 2015-11-19: qty 1

## 2015-11-19 MED ORDER — LEVOFLOXACIN 500 MG PO TABS
250.0000 mg | ORAL_TABLET | Freq: Every day | ORAL | Status: DC
  Administered 2015-11-20 – 2015-11-25 (×6): 250 mg via ORAL
  Filled 2015-11-19 (×6): qty 1

## 2015-11-19 MED ORDER — BOOST PLUS PO LIQD
237.0000 mL | Freq: Three times a day (TID) | ORAL | Status: DC
  Administered 2015-11-19 – 2015-11-25 (×11): 237 mL via ORAL
  Filled 2015-11-19 (×20): qty 237

## 2015-11-19 MED ORDER — ALPRAZOLAM 0.25 MG PO TABS
0.2500 mg | ORAL_TABLET | Freq: Three times a day (TID) | ORAL | Status: DC | PRN
  Administered 2015-11-19 – 2015-11-25 (×12): 0.25 mg via ORAL
  Filled 2015-11-19 (×12): qty 1

## 2015-11-19 MED ORDER — BUDESONIDE 0.5 MG/2ML IN SUSP
0.5000 mg | Freq: Two times a day (BID) | RESPIRATORY_TRACT | Status: DC
  Administered 2015-11-19 – 2015-11-22 (×8): 0.5 mg via RESPIRATORY_TRACT
  Filled 2015-11-19 (×7): qty 2

## 2015-11-19 NOTE — Progress Notes (Signed)
Pharmacy Consult - Antibiotic Renal Dose Adjustment  [name] is a 75 y.o. female admitted on 11/18/2015 for acute exacerbation of COPD. The patient was previously on 3 days of outpatient azithromycin (500mg  PO day1 and 250mg  PO day 2&3). On 11/19/2015, azithromycin was discontinued and the patient was started on levofloxacin 500mg  PO daily.  Assessment: 11/19/2015:  Wt: 37.3kg  SCr: 0.53 - stable   CrCl: 36.3 ml/min  Plan: Due to the patient's current renal function, administer levofloxacin 500 mg PO x1 initial dose, followed by levofloxacin 250mg  PO q24h.    Carolin Coy, PharmD Candidate  I agree with the student's assessment and plan above. Hershal Coria, PharmD Pager: [phone]

## 2015-11-19 NOTE — Progress Notes (Signed)
PULMONARY / CRITICAL CARE MEDICINE   Name: [name] MRN: [id number] DOB: 04-02-1941    ADMISSION DATE:  11/18/2015 CONSULTATION DATE:  11/18/15  REFERRING MD:  Dr. Wilson Singer / EDP   CHIEF COMPLAINT:  AECOPD   HISTORY OF PRESENT ILLNESS:   75 y/o, former smoker (quit 30+ years ago), with PMH of HTN, HLD, osteoporosis, remote ovarian CA and 2L O2 dependent COPD (followed by Dr. Lamonte Sakai, 2001 FEV1 of 0.5L or 23% prediced) who presented to [facility] on 1/12 with a three day history of worsening shortness of breath.  The patient reports she began feeling more short of breath the weekend prior to admission.  She attempted to use home nebulizer's with minimal relief.  She also noted a non-productive cough.  Denies fevers, chills, sputum production.  The patient started herself on a prednisone taper on 1/9 without improvement.  She was seen by Dr. Lamonte Sakai for an acute visit on 1/10 and prescribed azithromycin and instructed to complete her prednisone taper.   The patient returned home but continued to worsen in regards to shortness of breath.  Am of presentation on 1/12, she woke with increased symptoms and activated EMS.  EMS evaluation notable for saturations of 84-90%.  She was treated with albuterol / atrovent and CPAP.  On arrival to ER, she was in respiratory distress.  Patient received solumedrol, additional atrovent and BiPAP.  ABG evaluation 7.234 and pCO2 of 98.  WBC 16.2 (prior steroids).  CXR evaluation consistent with changes related to COPD without acute infiltrate.    PCCM called for admission.   SUBJECTIVE:  Off bipap - breathing better Afebrile Cough - non productive   VITAL SIGNS: BP 167/76 mmHg  Pulse 103  Temp(Src) 98.3 F (36.8 C) (Oral)  Resp 26  Ht 5\' 1"  (1.549 m)  Wt 82 lb 3.7 oz (37.3 kg)  BMI 15.55 kg/m2  SpO2 96%  HEMODYNAMICS:    VENTILATOR SETTINGS: Vent Mode:  [-]  FiO2 (%):  [40 %] 40 % PEEP:  [6 cmH20] 6 cmH20  INTAKE / OUTPUT: I/O last 3 completed  shifts: In: 1159.2 [I.V.:909.2; IV Piggyback:250] Out: 1900 [Urine:1900]  PHYSICAL EXAMINATION: General:  Frail elderly female, no resp distress Neuro:  Anxious, AAOx4, speech clear  HEENT:  MM pink/dry, [place] Cardiovascular:  s1s2 rrr, no m/r/g Lungs:  Prolonged exp phase, distant breath sounds, decreased air movement, no wheeze Abdomen:  Soft, non-tender, BSx4 active  Musculoskeletal:  No acute deformities Skin:  Warm/dry, thin skin, scattered ecchymosis on upper extremities, BLE symmetrical pedal edema  LABS:  BMET  Recent Labs Lab 11/18/15 0820 11/19/15 0350  NA 142 143  K 3.8 4.3  CL 92* 97*  CO2 39* 39*  BUN 16 17  CREATININE 0.50 0.53  GLUCOSE 148* 172*    Electrolytes  Recent Labs Lab 11/18/15 0820 11/19/15 0350  CALCIUM 9.3 9.1    CBC  Recent Labs Lab 11/18/15 0820 11/19/15 0350  WBC 16.2* 8.8  HGB 10.9* 10.0*  HCT 36.6 33.6*  PLT 216 190    Coag's No results for input(s): APTT, INR in the last 168 hours.  Sepsis Markers No results for input(s): LATICACIDVEN, PROCALCITON, O2SATVEN in the last 168 hours.  ABG  Recent Labs Lab 11/18/15 0820  PHART 7.234*  PCO2ART 98.8*  PO2ART 183*    Liver Enzymes No results for input(s): AST, ALT, ALKPHOS, BILITOT, ALBUMIN in the last 168 hours.  Cardiac Enzymes No results for input(s): TROPONINI, PROBNP in the last 168 hours.  Glucose No results for input(s): GLUCAP in the last 168 hours.  Imaging Dg Chest Port 1 View  11/19/2015  CLINICAL DATA:  COPD exacerbation. EXAM: PORTABLE CHEST 1 VIEW COMPARISON:  11/18/2015 FINDINGS: Atherosclerotic aortic arch. Severe emphysema. Heart size within normal limits. The stable right upper lobe scarring. Subsegmental atelectasis or scarring along the left hemidiaphragm. Bilateral hilar prominence likely from pulmonary arterial hypertension. IMPRESSION: 1. Emphysema with likely secondary pulmonary are home arterial hypertension. 2. Mild scarring or atelectasis  in the right upper lobe and left lung base. Electronically Signed   By: Van Clines M.D.   On: 11/19/2015 07:52     STUDIES:    CULTURES: Sputum 1/12 >>   ANTIBIOTICS: Azithromycin 1/10 (on as outpatient) >>   SIGNIFICANT EVENTS: 01/12  Admit with AECOPD  LINES/TUBES:   DISCUSSION: 75 y/o F with severe COPD admitteed on 1/12 with acute on chronic hypercapic respiratory failure secondary to AECOPD requiring bipap.  ASSESSMENT / PLAN:  PULMONARY A: Acute on Chronic Hypercapnic Respiratory Failure secondary to AECOPD Severe COPD - baseline 2L O2 dependent  P:   Admit SDU  Ct Solumedrol 40 mg IV BID BiPAP PRN for increased WOB Brovana + Pulmicort Q12 Q6 duonebs + PRN albuterol  ? If she needs transition to nebulized medications at home, will need to discuss with patient Start PO levaquin, dc azithro  CARDIOVASCULAR A:  Hx HTN, HLD - EKG normal on admit P:  SDU monitoring  Continue benicar, atorvastatin, ASA, norvasc PRN hydralazine for SBP > 170   HEMATOLOGIC A:   Anemia - macrocytic  P:  Assess B12 Trend CBC Heparin for DVT prophylaxis   She only has a brother in [place]. Should clarify advance directives for future. She just changed from Dr Gwenette Greet to [facility]  Transfer to floor in 24h  Rigoberto Noel. MD [phone]   11/19/2015, 9:46 AM

## 2015-11-19 NOTE — Progress Notes (Signed)
Initial Nutrition Assessment  DOCUMENTATION CODES:   Underweight, Severe malnutrition in context of chronic illness  INTERVENTION:  -RD to continue to monitor for needs -Boost Plus TID, Each supplement provides 360 calories and 14 grams of protein   NUTRITION DIAGNOSIS:   Malnutrition related to poor appetite as evidenced by severe depletion of muscle mass, severe depletion of body fat.  GOAL:   Patient will meet greater than or equal to 90% of their needs  MONITOR:   PO intake, Supplement acceptance, Labs, I & O's, Weight trends  REASON FOR ASSESSMENT:   Other (Comment) (Underweight.)    ASSESSMENT:   75 y/o, former smoker (quit 30+ years ago), with PMH of HTN, HLD, osteoporosis, remote ovarian CA and 2L O2 dependent COPD (followed by Dr. Lamonte Sakai, 2001 FEV1 of 0.5L or 23% prediced) who presented to [facility] on 1/12 with a three day history of worsening shortness of breath.  Spoke with pt briefly at bedside. Pt has no acute complaints pertaining to nausea/vomiting/diarrhea/constipation. No chewing or swallowing problems. States her usual weight is in the 90s(#'s) but recently has been dropping some weight. Doesn't know why. 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  Nutrition-Focused physical exam completed. Findings are severe fat depletion, moderate-severe muscle depletion, and no edema.   With pt's apparent poor po intake, severe muscle wasting at some body parts and severe fat loss, patient appears severely malnourished.  Labs and Medications reviewed. Monitor for supplement acceptance.  Diet Order:  Diet Heart Room service appropriate?: Yes; Fluid consistency:: Thin  Skin:  Wound (see comment) (ecchymosis to arms and legs.)  Last BM:     Height:   Ht Readings from Last 1 Encounters:  11/18/15 5\' 1"  (1.549 m)    Weight:   Wt Readings from Last 1 Encounters:  11/19/15 82 lb 3.7 oz (37.3 kg)    Ideal Body Weight:  47.72 kg  BMI:  Body mass index is 15.55  kg/(m^2).  Estimated Nutritional Needs:   Kcal:  1400-1700 calories  Protein:  35-45 grams  Fluid:  >/= 1.4L  EDUCATION NEEDS:   Education needs addressed  Satira Anis. Carman Essick, MS, RD LDN After Hours/Weekend Pager [phone]

## 2015-11-19 NOTE — Progress Notes (Signed)
Pt seen and given her scheduled neb tx.  HR89, RR21, spo2 100% on 3lnc.  No respiratory distress or increased wob noted.  Bipap not indicated at this time but remains in room on standy.  O2 weaned down to 2Lnc post hhn.

## 2015-11-20 MED ORDER — ASPIRIN EC 81 MG PO TBEC
81.0000 mg | DELAYED_RELEASE_TABLET | Freq: Every day | ORAL | Status: DC
  Administered 2015-11-20 – 2015-11-25 (×6): 81 mg via ORAL
  Filled 2015-11-20 (×6): qty 1

## 2015-11-20 MED ORDER — ACETAMINOPHEN 325 MG PO TABS
650.0000 mg | ORAL_TABLET | Freq: Four times a day (QID) | ORAL | Status: DC | PRN
  Administered 2015-11-20 – 2015-11-24 (×2): 650 mg via ORAL
  Filled 2015-11-20 (×3): qty 2

## 2015-11-20 MED ORDER — GUAIFENESIN ER 600 MG PO TB12
1200.0000 mg | ORAL_TABLET | Freq: Two times a day (BID) | ORAL | Status: DC
  Administered 2015-11-20 – 2015-11-25 (×11): 1200 mg via ORAL
  Filled 2015-11-20 (×11): qty 2

## 2015-11-20 MED ORDER — FLUTICASONE PROPIONATE 50 MCG/ACT NA SUSP
2.0000 | Freq: Every day | NASAL | Status: DC
  Administered 2015-11-22 – 2015-11-24 (×3): 2 via NASAL
  Filled 2015-11-20 (×2): qty 16

## 2015-11-20 MED ORDER — LORATADINE 10 MG PO TABS
10.0000 mg | ORAL_TABLET | Freq: Every day | ORAL | Status: DC
  Administered 2015-11-20 – 2015-11-25 (×6): 10 mg via ORAL
  Filled 2015-11-20 (×6): qty 1

## 2015-11-20 MED ORDER — PANTOPRAZOLE SODIUM 40 MG PO TBEC
40.0000 mg | DELAYED_RELEASE_TABLET | Freq: Every day | ORAL | Status: DC
  Administered 2015-11-20 – 2015-11-25 (×6): 40 mg via ORAL
  Filled 2015-11-20 (×7): qty 1

## 2015-11-20 NOTE — Progress Notes (Signed)
Spoke to patient verified that she was using [facility] [place] service prior to admit.  Patient states if she needs more skilled services she would want [facility] to provide them at discharge.  Will continue to follow for CM needs.   Rayburn Ma RN, BSN, CM

## 2015-11-20 NOTE — Progress Notes (Signed)
[place] Progress Note Patient Name: [name] DOB: 04/11/1941 MRN: [id number]   Date of Service  11/20/2015  HPI/Events of Note  Patient looks comfortable on 3 L/min [place] O2 with sat = 100% and RR = 19.  eICU Interventions  Can transfer to Telemetry with cardiac monitoring.      Intervention Category Minor Interventions: Routine modifications to care plan (e.g. PRN medications for pain, fever)  Sommer,Steven Eugene 11/20/2015, 6:11 PM

## 2015-11-20 NOTE — Progress Notes (Signed)
Pt not in distress at this time, pt on 3L nasal cannula at this time and tolerating well. All vital signs within normal range, RT to monitor as needed

## 2015-11-20 NOTE — Progress Notes (Signed)
PCCM PROGRESS NOTE  ADMISSION DATE: 11/18/2015 REFERRING PROVIDER: Dr. Wilson Singer  CC: Short of breath  SUBJECTIVE: She is still having cough, chest congestion, and sinus congestion.  She is scared that she will be sent home too soon.  VITAL SIGNS: BP 164/76 mmHg  Pulse 98  Temp(Src) 98.4 F (36.9 C) (Oral)  Resp 22  Ht 5\' 1"  (1.549 m)  Wt 82 lb 3.7 oz (37.3 kg)  BMI 15.55 kg/m2  SpO2 97%  INTAKE/OUTPUT: I/O last 3 completed shifts: In: 1920 [P.O.:120; I.V.:1800] Out: 2500 [Urine:2500]  General: thin HEENT: clear nasal discharge Cardiac: regular, no murmur Chest: no wheeze, poor air movement Abd: soft, non tender Ext: no edema Neuro: normal strength Skin: multiple areas of ecchymosis   CMP Latest Ref Rng 11/19/2015 11/18/2015 03/14/2015  Glucose 65 - 99 mg/dL 172(H) 148(H) 119(H)  BUN 6 - 20 mg/dL 17 16 15   Creatinine 0.44 - 1.00 mg/dL 0.53 0.50 0.55  Sodium 135 - 145 mmol/L 143 142 140  Potassium 3.5 - 5.1 mmol/L 4.3 3.8 4.5  Chloride 101 - 111 mmol/L 97(L) 92(L) 94(L)  CO2 22 - 32 mmol/L 39(H) 39(H) 41(H)  Calcium 8.9 - 10.3 mg/dL 9.1 9.3 9.9  Total Protein 6.0 - 8.3 g/dL - - -  Total Bilirubin 0.3 - 1.2 mg/dL - - -  Alkaline Phos 39 - 117 U/L - - -  AST 0 - 37 U/L - - -  ALT 0 - 35 U/L - - -     CBC Latest Ref Rng 11/19/2015 11/18/2015 03/14/2015  WBC 4.0 - 10.5 K/uL 8.8 16.2(H) 12.6(H)  Hemoglobin 12.0 - 15.0 g/dL 10.0(L) 10.9(L) 10.5(L)  Hematocrit 36.0 - 46.0 % 33.6(L) 36.6 34.8(L)  Platelets 150 - 400 K/uL 190 216 199     Dg Chest Port 1 View  11/19/2015  CLINICAL DATA:  COPD exacerbation. EXAM: PORTABLE CHEST 1 VIEW COMPARISON:  11/18/2015 FINDINGS: Atherosclerotic aortic arch. Severe emphysema. Heart size within normal limits. The stable right upper lobe scarring. Subsegmental atelectasis or scarring along the left hemidiaphragm. Bilateral hilar prominence likely from pulmonary arterial hypertension. IMPRESSION: 1. Emphysema with likely secondary pulmonary are  home arterial hypertension. 2. Mild scarring or atelectasis in the right upper lobe and left lung base. Electronically Signed   By: Van Clines M.D.   On: 11/19/2015 07:52     CULTURES:  ANTIBIOTICS: 1/12 Zithromax >> 1/13 1/13 Levaquin >>   STUDIES:  EVENTS: 1/12 Admit  DISCUSSION: 75 yo female former smoker presented with progressive dyspnea and cough from AECOPD that didn't respond to outpt prednisone/Abx.  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  ASSESSMENT/PLAN:  Acute on chronic hypoxic/hypercapnic respiratory failure. Plan: - adjust oxygen to keep SpO2 88 to 95% - prn BiPAP  AECOPD. Plan: - day 2 of levaquin - continue brovana, pulmicort, duoneb - continue solumedrol 40 mg q12  Rhinitis. Plan: - flonase, claritin  Hx of HTN, HLD. Plan: - continue norvasc, lipitor, ASA, avapro - d/c IV fluid  Hx of anxiety. Plan: - prn xanax  Deconditioning. Plan: - PT/OT evaluation   Diet >> Regular diet DVT prophylaxis >> SQ heparin SUP >> Protonix while on high dose steroids Goals of care >> full code  Chesley Mires, MD [place] 11/20/2015, 9:54 AM Pager:  [phone] After 3pm call: [phone]

## 2015-11-20 NOTE — Progress Notes (Signed)
PT demonstrated verbal and hands on understanding of Flutter device. 

## 2015-11-21 NOTE — Evaluation (Signed)
Physical Therapy Evaluation Patient Details Name: LILLYONNA MAUTNER MRN: [id number] DOB: 1941-01-18 Today's Date: 11/21/2015   History of Present Illness  75 yo female admitted with COPD exac, acute resp failure. Hx of HTn, ov cancer, osteoporosis, emphysema, osteoporosis.   Clinical Impression  Pt anxious about not having breathing tx from respiratory. Offered to return later, but pt agreeable to participate at this time. On eval, pt was Min guard assist for mobility-walked ~75 feet on 3L [place] O2. O2 sats dropped to 83% during ambulation. VCs for pursed lip breathing throughout session. Pt declined to sit up in chair. Assisted pt back to bed at her request. Respiratory in to give tx as PT was closing session. Recommend HHPT and 24 hour supervision/assist at this time.     Follow Up Recommendations Home health PT;Supervision/Assistance - 24 hour    Equipment Recommendations  None recommended by PT    Recommendations for Other Services       Precautions / Restrictions Precautions Precautions: Fall Precaution Comments: monitor sats Restrictions Weight Bearing Restrictions: No      Mobility  Bed Mobility Overal bed mobility: Modified Independent                Transfers Overall transfer level: Modified independent                  Ambulation/Gait Ambulation/Gait assistance: Min guard Ambulation Distance (Feet): 75 Feet Assistive device: None Gait Pattern/deviations: Step-through pattern;Decreased stride length     General Gait Details: slow gait speed. 2 brief standing rest breaks taken. VCs for pursed lip breathing. O2 sats dropped to 83% on 3L O2.   Stairs            Wheelchair Mobility    Modified Rankin (Stroke Patients Only)       Balance Overall balance assessment: Needs assistance         Standing balance support: During functional activity Standing balance-Leahy Scale: Good                               Pertinent  Vitals/Pain Pain Assessment: No/denies pain At rest: 98% 3L O2, during session: 83% 3L O2, end of session: 90% 3L O2, HR 104 bpm (RT giving breathing tx)    Home Living Family/patient expects to be discharged to:: Private residence Living Arrangements: Alone Available Help at Discharge: Available PRN/intermittently;Personal care attendant (8-12 4 days/week) Type of Home:  (townhome) Home Access: Stairs to enter   [employer] of Steps: 1 Home Layout: One level Home Equipment: None      Prior Function Level of Independence: Independent               Hand Dominance        Extremity/Trunk Assessment   Upper Extremity Assessment: Overall WFL for tasks assessed           Lower Extremity Assessment: Generalized weakness      Cervical / Trunk Assessment: Kyphotic  Communication   Communication: No difficulties  Cognition Arousal/Alertness: Awake/alert Behavior During Therapy: WFL for tasks assessed/performed Overall Cognitive Status: Within Functional Limits for tasks assessed                      General Comments      Exercises        Assessment/Plan    PT Assessment Patient needs continued PT services  PT Diagnosis Difficulty walking;Generalized weakness   PT  Problem List Decreased activity tolerance;Decreased mobility;Cardiopulmonary status limiting activity  PT Treatment Interventions Gait training;Functional mobility training;Therapeutic activities;Patient/family education;Therapeutic exercise   PT Goals (Current goals can be found in the Care Plan section) Acute Rehab PT Goals Patient Stated Goal: to not go home too soon PT Goal Formulation: With patient Time For Goal Achievement: 12/05/15 Potential to Achieve Goals: Good    Frequency Min 3X/week   Barriers to discharge        Co-evaluation               End of Session Equipment Utilized During Treatment: Oxygen Activity Tolerance: Patient limited by fatigue  (limited by dyspnea) Patient left: in bed;with call bell/phone within reach;with bed alarm set           Time: [id number] PT Time Calculation (min) (ACUTE ONLY): 20 min   Charges:   PT Evaluation $PT Eval Moderate Complexity: 1 Procedure     PT G Codes:        [name], MPT Pager: [phone]

## 2015-11-21 NOTE — Progress Notes (Signed)
PCCM PROGRESS NOTE  ADMISSION DATE: 11/18/2015 REFERRING PROVIDER: Dr. Wilson Singer  CC: Short of breath  SUBJECTIVE: She is still having cough, chest congestion, and sinus congestion.  She is scared that she will be sent home too soon. Wanted to talk through these, but admits she feels some better, still raspy throat.  VITAL SIGNS: BP 145/72 mmHg  Pulse 76  Temp(Src) 98.3 F (36.8 C) (Oral)  Resp 18  Ht 5\' 1"  (1.549 m)  Wt 39.644 kg (87 lb 6.4 oz)  BMI 16.52 kg/m2  SpO2 91%  INTAKE/OUTPUT: I/O last 3 completed shifts: In: 44 [P.O.:120; I.V.:650] Out: 2700 [Urine:2700]  General: thin, frail elderly woman HEENT: No thrush seen, no stridor Cardiac: regular, no murmur Chest: no wheeze, poor air movement Abd: soft, non tender Ext: no edema Neuro: normal strength Skin: multiple areas of ecchymosis   CMP Latest Ref Rng 11/19/2015 11/18/2015 03/14/2015  Glucose 65 - 99 mg/dL 172(H) 148(H) 119(H)  BUN 6 - 20 mg/dL 17 16 15   Creatinine 0.44 - 1.00 mg/dL 0.53 0.50 0.55  Sodium 135 - 145 mmol/L 143 142 140  Potassium 3.5 - 5.1 mmol/L 4.3 3.8 4.5  Chloride 101 - 111 mmol/L 97(L) 92(L) 94(L)  CO2 22 - 32 mmol/L 39(H) 39(H) 41(H)  Calcium 8.9 - 10.3 mg/dL 9.1 9.3 9.9  Total Protein 6.0 - 8.3 g/dL - - -  Total Bilirubin 0.3 - 1.2 mg/dL - - -  Alkaline Phos 39 - 117 U/L - - -  AST 0 - 37 U/L - - -  ALT 0 - 35 U/L - - -     CBC Latest Ref Rng 11/19/2015 11/18/2015 03/14/2015  WBC 4.0 - 10.5 K/uL 8.8 16.2(H) 12.6(H)  Hemoglobin 12.0 - 15.0 g/dL 10.0(L) 10.9(L) 10.5(L)  Hematocrit 36.0 - 46.0 % 33.6(L) 36.6 34.8(L)  Platelets 150 - 400 K/uL 190 216 199     No results found.   CULTURES:  ANTIBIOTICS: 1/12 Zithromax >> 1/13 1/13 Levaquin >>   STUDIES:  EVENTS: 1/12 Admit  DISCUSSION: 75 yo female former smoker presented with progressive dyspnea and cough from AECOPD that didn't respond to outpt prednisone/Abx.  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  ASSESSMENT/PLAN:  Acute on chronic hypoxic/hypercapnic respiratory failure. Plan: - adjust oxygen to keep SpO2 88 to 95% - prn BiPAP-doubt this will be needed  AECOPD. Plan: - day 3 of levaquin - continue brovana, pulmicort, duoneb - continue solumedrol 40 mg q12 ( she is convinced it will work better than prednisone)  Rhinitis. Plan: - flonase, claritin  Hx of HTN, HLD. Plan: - continue norvasc, lipitor, ASA, avapro - d/c IV fluid  Hx of anxiety. Needs lots of conversational reassurance Plan: - prn xanax  Deconditioning. Consider if SNF for outpatient rehab would be best transition on leaving hospital Plan: - PT/OT evaluation   Diet >> Regular diet DVT prophylaxis >> SQ heparin SUP >> Protonix while on high dose steroids Goals of care >> full code  CD Annamaria Boots, MD [place] 11/21/2015, 1:24 PM Pager:  [phone] After 3pm call: [phone]

## 2015-11-22 LAB — FOLATE RBC
FOLATE, HEMOLYSATE: 447 ng/mL
FOLATE, RBC: 1424 ng/mL (ref >498)
HEMATOCRIT: 31.4 % — AB (ref 34.0–46.6)

## 2015-11-22 MED ORDER — CARBAMIDE PEROXIDE 6.5 % OT SOLN
5.0000 [drp] | Freq: Two times a day (BID) | OTIC | Status: DC
  Administered 2015-11-22 – 2015-11-23 (×4): 5 [drp] via OTIC
  Filled 2015-11-22: qty 15

## 2015-11-22 MED ORDER — NYSTATIN 100000 UNIT/ML MT SUSP
5.0000 mL | Freq: Four times a day (QID) | OROMUCOSAL | Status: DC
  Administered 2015-11-22 – 2015-11-24 (×10): 500000 [IU] via ORAL
  Filled 2015-11-22 (×14): qty 5

## 2015-11-22 MED ORDER — IPRATROPIUM-ALBUTEROL 0.5-2.5 (3) MG/3ML IN SOLN
3.0000 mL | Freq: Four times a day (QID) | RESPIRATORY_TRACT | Status: DC
  Administered 2015-11-22 – 2015-11-23 (×4): 3 mL via RESPIRATORY_TRACT
  Filled 2015-11-22 (×4): qty 3

## 2015-11-22 NOTE — Progress Notes (Signed)
PCCM PROGRESS NOTE  ADMISSION DATE: 11/18/2015 REFERRING PROVIDER: Dr. Wilson Singer  CC: Short of breath  SUBJECTIVE: C/o ear and sinus congestion.  Still winded.  Doesn't feel like she is ready to go home.  VITAL SIGNS: BP 152/67 mmHg  Pulse 96  Temp(Src) 97.8 F (36.6 C) (Oral)  Resp 18  Ht 5\' 1"  (1.549 m)  Wt 87 lb 12.8 oz (39.826 kg)  BMI 16.60 kg/m2  SpO2 99%  INTAKE/OUTPUT: I/O last 3 completed shifts: In: 600 [P.O.:600] Out: 3350 [Urine:3350]  General: thin, frail elderly woman HEENT: no thrush Cardiac: regular, no murmur Chest: faint b/l wheeze Abd: soft, non tender Ext: no edema Neuro: normal strength Skin: multiple areas of ecchymosis   CMP Latest Ref Rng 11/19/2015 11/18/2015 03/14/2015  Glucose 65 - 99 mg/dL 172(H) 148(H) 119(H)  BUN 6 - 20 mg/dL 17 16 15   Creatinine 0.44 - 1.00 mg/dL 0.53 0.50 0.55  Sodium 135 - 145 mmol/L 143 142 140  Potassium 3.5 - 5.1 mmol/L 4.3 3.8 4.5  Chloride 101 - 111 mmol/L 97(L) 92(L) 94(L)  CO2 22 - 32 mmol/L 39(H) 39(H) 41(H)  Calcium 8.9 - 10.3 mg/dL 9.1 9.3 9.9  Total Protein 6.0 - 8.3 g/dL - - -  Total Bilirubin 0.3 - 1.2 mg/dL - - -  Alkaline Phos 39 - 117 U/L - - -  AST 0 - 37 U/L - - -  ALT 0 - 35 U/L - - -     CBC Latest Ref Rng 11/19/2015 11/18/2015 03/14/2015  WBC 4.0 - 10.5 K/uL 8.8 16.2(H) 12.6(H)  Hemoglobin 12.0 - 15.0 g/dL 10.0(L) 10.9(L) 10.5(L)  Hematocrit 36.0 - 46.0 % 33.6(L) 36.6 34.8(L)  Platelets 150 - 400 K/uL 190 216 199     No results found.   CULTURES:  ANTIBIOTICS: 1/12 Zithromax >> 1/13 1/13 Levaquin >>   STUDIES:  EVENTS: 1/12 Admit  DISCUSSION: 75 yo female former smoker presented with progressive dyspnea and cough from AECOPD that didn't respond to outpt prednisone/Abx.  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  ASSESSMENT/PLAN:  Acute on chronic hypoxic/hypercapnic respiratory failure. Plan: - adjust oxygen to keep SpO2 88 to 95%  AECOPD. Plan: - day 4/7 of levaquin - continue brovana, pulmicort, duoneb - continue solumedrol 40 mg q12h >> not ready to make change to prednisone  Rhinitis. Plan: - flonase, claritin  Hx of HTN, HLD. Plan: - continue norvasc, lipitor, ASA, avapro  Hx of anxiety >> significant component of her symptoms. Plan: - prn xanax  Cerumen build up. Plan: - debrox  Thrush. Plan: - nystatin  Deconditioning. Plan: - PT recommending home health PT with 24 hr supervision/assistance  DVT prophylaxis >> SQ heparin SUP >> Protonix while on high dose steroids Goals of care >> full code  Chesley Mires, MD [place] 11/22/2015, 11:38 AM Pager:  [phone] After 3pm call: [phone]

## 2015-11-22 NOTE — Progress Notes (Signed)
OT Cancellation Note  Patient Details Name: [name] MRN: [id number] DOB: September 27, 1941   Cancelled Treatment:    Reason Eval/Treat Not Completed: Other (comment) (pt declined, stating she did not need occupational therapy). Pt found supine in bed. During introduction, pt stopped therapist and stated "I do not need any occupational therapy." Pt stated she understood OT's role. Therapist talked with patient about equipment, "I don't need any equipment" and pt also stated she knew about energy conservation techniques. At this time, secondary to patient's refusal, am screening patient and completing OT order. If any OT needs arise, please send text page to OT services: [phone] OR call office at [phone]. Thank you for the order.    Chrys Racer , MS, OTR/L, CLT Pager: [phone]  11/22/2015, 2:49 PM

## 2015-11-23 LAB — CBC
HCT: 34.9 % — ABNORMAL LOW (ref 36.0–46.0)
HEMOGLOBIN: 10.4 g/dL — AB (ref 12.0–15.0)
MCH: 31 pg (ref 26.0–34.0)
MCHC: 29.8 g/dL — ABNORMAL LOW (ref 30.0–36.0)
MCV: 103.9 fL — AB (ref 78.0–100.0)
PLATELETS: 193 10*3/uL (ref 150–400)
RBC: 3.36 MIL/uL — AB (ref 3.87–5.11)
RDW: 13.6 % (ref 11.5–15.5)
WBC: 9.5 10*3/uL (ref 4.0–10.5)

## 2015-11-23 LAB — BASIC METABOLIC PANEL
ANION GAP: 9 (ref 5–15)
BUN: 25 mg/dL — ABNORMAL HIGH (ref 6–20)
CALCIUM: 9.5 mg/dL (ref 8.9–10.3)
CHLORIDE: 96 mmol/L — AB (ref 101–111)
CO2: 42 mmol/L — AB (ref 22–32)
Creatinine, Ser: 0.91 mg/dL (ref 0.44–1.00)
GFR calc non Af Amer: >60 mL/min (ref >60)
Glucose, Bld: 204 mg/dL — ABNORMAL HIGH (ref 65–99)
Potassium: 5.2 mmol/L — ABNORMAL HIGH (ref 3.5–5.1)
SODIUM: 147 mmol/L — AB (ref 135–145)

## 2015-11-23 MED ORDER — BUDESONIDE-FORMOTEROL FUMARATE 160-4.5 MCG/ACT IN AERO
2.0000 | INHALATION_SPRAY | Freq: Two times a day (BID) | RESPIRATORY_TRACT | Status: DC
  Administered 2015-11-23 – 2015-11-25 (×5): 2 via RESPIRATORY_TRACT
  Filled 2015-11-23: qty 6

## 2015-11-23 MED ORDER — TIOTROPIUM BROMIDE MONOHYDRATE 18 MCG IN CAPS
18.0000 ug | ORAL_CAPSULE | Freq: Every day | RESPIRATORY_TRACT | Status: DC
  Administered 2015-11-24 – 2015-11-25 (×2): 18 ug via RESPIRATORY_TRACT
  Filled 2015-11-23: qty 5

## 2015-11-23 MED ORDER — PREDNISONE 20 MG PO TABS
40.0000 mg | ORAL_TABLET | Freq: Every day | ORAL | Status: DC
  Administered 2015-11-24: 40 mg via ORAL
  Filled 2015-11-23: qty 2

## 2015-11-23 MED ORDER — ALBUTEROL SULFATE (2.5 MG/3ML) 0.083% IN NEBU
2.5000 mg | INHALATION_SOLUTION | Freq: Four times a day (QID) | RESPIRATORY_TRACT | Status: DC
  Administered 2015-11-23 – 2015-11-25 (×8): 2.5 mg via RESPIRATORY_TRACT
  Filled 2015-11-23 (×8): qty 3

## 2015-11-23 NOTE — Care Management Note (Signed)
Case Management Note  Patient Details  Name: [name] MRN: [id number] Date of Birth: 12/01/1940  Subjective/Objective:     75 yo admitted with COPD exacerbation               Action/Plan: From home alone and has Private Duty services from [facility]. Pt has used [facility] previously for [facility] services and would like to use them again for PT/RN/Aide. Orders in place and [facility] rep contacted for referral. Pt has home 02 currently through [facility].  Expected Discharge Date:   (UNKNOWN)               Expected Discharge Plan:  [place]  In-House Referral:     Discharge planning Services  CM Consult  Post Acute Care Choice:  Home Health Choice offered to:  Patient  DME Arranged:    DME Agency:     HH Arranged:  RN, PT, Nurse's Aide [place] Agency:  [place]  Status of Service:  In process, will continue to follow  Medicare Important Message Given:  Yes Date Medicare IM Given:    Medicare IM give by:    Date Additional Medicare IM Given:    Additional Medicare Important Message give by:     If discussed at [place] of Stay Meetings, dates discussed:    Additional Comments:  Lynnell Catalan, RN 11/23/2015, 10:59 AM

## 2015-11-23 NOTE — Progress Notes (Addendum)
Inpatient Diabetes Program Recommendations  AACE/ADA: New Consensus Statement on Inpatient Glycemic Control (2015)  Target Ranges:  Prepandial:   less than 140 mg/dL      Peak postprandial:   less than 180 mg/dL (1-2 hours)      Critically ill patients:  140 - 180 mg/dL    Results for DAISA, LES (MRN [id number]) as of 11/23/2015 09:07  Ref. Range 11/23/2015 04:05  Glucose Latest Ref Range: 65-99 mg/dL 204 (H)      MD- Note patient currently getting IV Solumedrol 40 mg Q12 hours.  Lab glucose elevated this AM to 204 mg/dl.  Note patient does Not have History of DM.  If this trend persists while patient getting IV steroids, please consider checking patient's CBGs and starting Novolog Sensitive SSI if elevated.     --Will follow patient during hospitalization--  Wyn Quaker RN, MSN, CDE Diabetes Coordinator Inpatient Glycemic Control Team Team Pager: [phone] (8a-5p)

## 2015-11-23 NOTE — Progress Notes (Signed)
Patient ambulated with NT at 1050, was on 2L per nasal cannula. Patient's oxygen dropped to 71% and HR 132. Placed back to room and increased oxygen to 3L per nasal cannula. Will continue to monitor.

## 2015-11-23 NOTE — Progress Notes (Signed)
PT Cancellation Note  Patient Details Name: [name] MRN: [id number] DOB: 1941-05-30   Cancelled Treatment:    Reason Eval/Treat Not Completed: Fatigue/lethargy limiting ability to participate Pt just ambulated with NT, saturations dropped and pt had difficulty getting back to room per NT.     Kaitlan Bin,KATHrine E 11/23/2015, 11:12 AM Carmelia Bake, PT, DPT 11/23/2015 Pager: [phone]

## 2015-11-23 NOTE — Progress Notes (Signed)
PCCM PROGRESS NOTE  ADMISSION DATE: 11/18/2015 REFERRING PROVIDER: Dr. Wilson Singer  CC: Short of breath  SUBJECTIVE:  Pt continues to complain of sinus congestion, feels better today, moist cough but no sputum production  VITAL SIGNS: BP 131/62 mmHg  Pulse 62  Temp(Src) 98.2 F (36.8 C) (Oral)  Resp 18  Ht 5' (1.524 m)  Wt 86 lb 6.7 oz (39.2 kg)  BMI 16.88 kg/m2  SpO2 100%  INTAKE/OUTPUT: I/O last 3 completed shifts: In: 855 [P.O.:840; Other:15] Out: 2200 [Urine:2200]  General: thin, frail elderly woman HEENT: no thrush Cardiac: regular, no murmur Chest: faint b/l wheeze Abd: soft, non tender Ext: no edema Neuro: normal strength Skin: multiple areas of ecchymosis   CMP Latest Ref Rng 11/23/2015 11/19/2015 11/18/2015  Glucose 65 - 99 mg/dL 204(H) 172(H) 148(H)  BUN 6 - 20 mg/dL 25(H) 17 16  Creatinine 0.44 - 1.00 mg/dL 0.91 0.53 0.50  Sodium 135 - 145 mmol/L 147(H) 143 142  Potassium 3.5 - 5.1 mmol/L 5.2(H) 4.3 3.8  Chloride 101 - 111 mmol/L 96(L) 97(L) 92(L)  CO2 22 - 32 mmol/L 42(H) 39(H) 39(H)  Calcium 8.9 - 10.3 mg/dL 9.5 9.1 9.3  Total Protein 6.0 - 8.3 g/dL - - -  Total Bilirubin 0.3 - 1.2 mg/dL - - -  Alkaline Phos 39 - 117 U/L - - -  AST 0 - 37 U/L - - -  ALT 0 - 35 U/L - - -     CBC Latest Ref Rng 11/23/2015 11/19/2015 11/19/2015  WBC 4.0 - 10.5 K/uL 9.5 8.8 -  Hemoglobin 12.0 - 15.0 g/dL 10.4(L) 10.0(L) -  Hematocrit 36.0 - 46.0 % 34.9(L) 31.4(L) 33.6(L)  Platelets 150 - 400 K/uL 193 190 -     No results found.   CULTURES:  ANTIBIOTICS: 1/12 Zithromax >> 1/13 1/13 Levaquin >>   STUDIES:  EVENTS: 1/12 Admit  DISCUSSION: 75 yo female former smoker presented with progressive dyspnea and cough from AECOPD that didn't respond to outpt prednisone/Abx.  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  ASSESSMENT/PLAN:  Acute on chronic hypoxic/hypercapnic respiratory failure. Plan: - adjust oxygen to keep SpO2 88 to 95% - assess ambulatory saturations on 2L to ensure she does not need higher liter flow  - discussed 2L at rest, 3 with exertion  AECOPD. Plan: - day 5/7 of levaquin - continue brovana, pulmicort, duoneb - transition to prednisone   Rhinitis. Plan: - flonase, claritin  Hx of HTN, HLD. Plan: - continue norvasc, lipitor, ASA, avapro  Hx of anxiety >> significant component of her symptoms. Plan: - prn xanax  Cerumen build up. Plan: - debrox  Thrush. Plan: - nystatin  Deconditioning. Plan: - PT recommending home health PT with 24 hr supervision/assistance.  Arrange for PT, RN.    DVT prophylaxis >> SQ heparin SUP >> Protonix while on high dose steroids Goals of care >> full code    Jessica Gens, NP-C [facility] Pgr: [phone] or if no answer [phone] 11/23/2015, 10:11 AM

## 2015-11-23 NOTE — Progress Notes (Signed)
Nutrition Follow-up  DOCUMENTATION CODES:   Underweight, Severe malnutrition in context of chronic illness  INTERVENTION:   Continue Boost Plus TID, each provides 360 kcal and 14g of protein Encourage PO intake RD to continue to monitor  NUTRITION DIAGNOSIS:   Malnutrition related to poor appetite as evidenced by severe depletion of muscle mass, severe depletion of body fat.  Ongoing.  GOAL:   Patient will meet greater than or equal to 90% of their needs  Meeting.  MONITOR:   PO intake, Supplement acceptance, Labs, I & O's, Weight trends    ASSESSMENT:   75 y/o, former smoker (quit 30+ years ago), with PMH of HTN, HLD, osteoporosis, remote ovarian CA and 2L O2 dependent COPD (followed by Dr. Lamonte Sakai, 2001 FEV1 of 0.5L or 23% prediced) who presented to [facility] on 1/12 with a three day history of worsening shortness of breath.  Pt currently eating lunch during RD visit. Pt eating chicken salad but reports her appetite is still less than what it was. Pt eating 100% of regular diet. Pt drinking Boost Plus. Encouraged pt to continue supplements after discharge.   Labs reviewed: Elevated Na, K, BUN Glucose 204  Diet Order:  Diet regular Room service appropriate?: Yes; Fluid consistency:: Thin  Skin:  Wound (see comment) (ecchymosis to arms and legs.)  Last BM:  1/16  Height:   Ht Readings from Last 1 Encounters:  11/22/15 5' (1.524 m)    Weight:   Wt Readings from Last 1 Encounters:  11/22/15 86 lb 6.7 oz (39.2 kg)    Ideal Body Weight:  47.72 kg  BMI:  Body mass index is 16.88 kg/(m^2).  Estimated Nutritional Needs:   Kcal:  1500-1700  Protein:  55-65g  Fluid:  1.5L/day  EDUCATION NEEDS:   Education needs addressed  Clayton Bibles, MS, RD, LDN Pager: [phone] After Hours Pager: [phone]

## 2015-11-23 NOTE — Clinical Documentation Improvement (Signed)
Pulmonary  Can the diagnosis of Malnutrition be further specified? Please see Nutritional Consult (1/13) so you can render an opinion and document findings in next progress note; NOT in BPA drop down box. Thanks.   Document Severity - Severe(third degree), Moderate (second degree), Mild (first degree)  Other condition  Unable to clinically determine  Document any associated diagnoses/conditions  Supporting Information: :   Consult notes Severe Malnutrition with BMI of 15.55; severe depletion of muscle mass and body fat  Recommend Boost Plus TID  Please exercise your independent, professional judgment when responding. A specific answer is not anticipated or expected.  Thank You, Zoila Shutter RN, BSN, [place] [phone]; Cell: [phone]

## 2015-11-24 DIAGNOSIS — J9622 Acute and chronic respiratory failure with hypercapnia: Secondary | ICD-10-CM | POA: Insufficient documentation

## 2015-11-24 MED ORDER — ENSURE ENLIVE PO LIQD
237.0000 mL | Freq: Three times a day (TID) | ORAL | Status: DC
  Administered 2015-11-24: 237 mL via ORAL

## 2015-11-24 MED ORDER — OXYMETAZOLINE HCL 0.05 % NA SOLN
1.0000 | Freq: Two times a day (BID) | NASAL | Status: DC
Start: 2015-11-24 — End: 2015-11-25
  Administered 2015-11-24 – 2015-11-25 (×3): 1 via NASAL
  Filled 2015-11-24: qty 15

## 2015-11-24 MED ORDER — METHYLPREDNISOLONE SODIUM SUCC 40 MG IJ SOLR
40.0000 mg | Freq: Two times a day (BID) | INTRAMUSCULAR | Status: DC
  Administered 2015-11-24 – 2015-11-25 (×2): 40 mg via INTRAVENOUS
  Filled 2015-11-24 (×3): qty 1

## 2015-11-24 NOTE — NC FL2 (Deleted)
[place] LEVEL OF CARE SCREENING TOOL     IDENTIFICATION  Patient Name: [name] Birthdate: 01-Nov-1941 Sex: female Admission Date (Current Location): 11/18/2015  [facility] and [place] Number:  Herbalist and Address:  [facility],  [place] [address] [place]      Provider Number: [id number]  Attending Physician Name and Address:  Rigoberto Noel, MD  Relative Name and Phone Number:       Current Level of Care: Hospital Recommended Level of Care: [place] Prior Approval Number:    Date Approved/Denied:   PASRR Number:    Discharge Plan: SNF    Current Diagnoses: Patient Active Problem List   Diagnosis Date Noted  . COPD exacerbation ([place]) 11/18/2015  . Protein-calorie malnutrition, severe ([place]) 03/15/2015  . Acute respiratory distress (HCC) 03/14/2015  . Alcohol abuse   . Acute on chronic respiratory failure ([place]) 12/02/2014  . Activity intolerance 12/02/2014  . Steroid-induced hyperglycemia 12/02/2014  . Chronic respiratory failure ([place]) 07/15/2014  . HTN (hypertension) 10/19/2013  . HLD (hyperlipidemia) 10/19/2013  . Leukocytosis 10/19/2013  . Anemia of chronic disease 10/19/2013  . COPD (chronic obstructive pulmonary disease) with emphysema ([place]) 11/05/2012  . Anemia 10/30/2012  . Anxiety 10/29/2012    Orientation RESPIRATION BLADDER Height & Weight    Self, Time, Situation, Place  O2 (3L) Continent 5' (152.4 cm) 83 lbs.  BEHAVIORAL SYMPTOMS/MOOD NEUROLOGICAL BOWEL NUTRITION STATUS   (no behaviors)  (None) Continent Diet (Diet Regular)  AMBULATORY STATUS COMMUNICATION OF NEEDS Skin   Limited Assist Verbally Normal                       Personal Care Assistance Level of Assistance  Bathing, Feeding, Dressing Bathing Assistance: Limited assistance Feeding assistance: Independent Dressing Assistance: Limited assistance     Functional Limitations Info  Sight, Hearing, Speech Sight  Info: Adequate Hearing Info: Adequate Speech Info: Adequate    SPECIAL CARE FACTORS FREQUENCY  PT (By licensed PT)     PT Frequency: 5 x a week              Contractures Contractures Info: Not present    Additional Factors Info  Code Status, Allergies Code Status Info: FULL code status Allergies Info: Oxycodone-acetaminophen           Current Medications (11/24/2015):  This is the current hospital active medication list Current Facility-Administered Medications  Medication Dose Route Frequency Provider Last Rate Last Dose  . 0.9 %  sodium chloride infusion  250 mL Intravenous PRN Donita Brooks, NP      . acetaminophen (TYLENOL) tablet 650 mg  650 mg Oral Q6H PRN Colbert Coyer, MD   650 mg at 11/24/15 0708  . albuterol (PROVENTIL) (2.5 MG/3ML) 0.083% nebulizer solution 2.5 mg  2.5 mg Nebulization Q2H PRN Donita Brooks, NP   2.5 mg at 11/22/15 0649  . albuterol (PROVENTIL) (2.5 MG/3ML) 0.083% nebulizer solution 2.5 mg  2.5 mg Nebulization Q6H Chesley Mires, MD   2.5 mg at 11/24/15 0216  . ALPRAZolam Duanne Moron) tablet 0.25 mg  0.25 mg Oral TID PRN Donita Brooks, NP   0.25 mg at 11/23/15 2215  . amLODipine (NORVASC) tablet 2.5 mg  2.5 mg Oral q morning - 10a Donita Brooks, NP   2.5 mg at 11/24/15 1014  . aspirin EC tablet 81 mg  81 mg Oral Daily Chesley Mires, MD   81 mg at  11/24/15 1013  . atorvastatin (LIPITOR) tablet 80 mg  80 mg Oral Daily Donita Brooks, NP   80 mg at 11/21/15 0935  . budesonide-formoterol (SYMBICORT) 160-4.5 MCG/ACT inhaler 2 puff  2 puff Inhalation BID Chesley Mires, MD   2 puff at 11/23/15 1957  . calcium carbonate (OSCAL) tablet 1,500 mg  1 tablet Oral BID Donita Brooks, NP   1,500 mg at 11/18/15 2200  . carbamide peroxide (DEBROX) 6.5 % otic solution 5 drop  5 drop Both Ears BID Chesley Mires, MD   5 drop at 11/23/15 2204  . fluticasone (FLONASE) 50 MCG/ACT nasal spray 2 spray  2 spray Each Nare Daily Chesley Mires, MD   2 spray at 11/24/15 1019  .  guaiFENesin (MUCINEX) 12 hr tablet 1,200 mg  1,200 mg Oral BID Chesley Mires, MD   1,200 mg at 11/24/15 1013  . heparin injection 5,000 Units  5,000 Units Subcutaneous 3 times per day Donita Brooks, NP   5,000 Units at 11/24/15 0647  . hydrALAZINE (APRESOLINE) injection 10-20 mg  10-20 mg Intravenous Q4H PRN Donita Brooks, NP   10 mg at 11/19/15 2218  . irbesartan (AVAPRO) tablet 300 mg  300 mg Oral Daily Donita Brooks, NP   300 mg at 11/24/15 1013  . lactose free nutrition (BOOST PLUS) liquid 237 mL  237 mL Oral TID WC Satira Anis Ward, RD   237 mL at 11/23/15 1303  . levofloxacin (LEVAQUIN) tablet 250 mg  250 mg Oral Daily Donald Prose Runyon, RPH   250 mg at 11/24/15 1013  . loratadine (CLARITIN) tablet 10 mg  10 mg Oral Daily Chesley Mires, MD   10 mg at 11/24/15 1013  . multivitamin with minerals tablet 1 tablet  1 tablet Oral Daily Donita Brooks, NP   1 tablet at 11/21/15 0935  . nystatin (MYCOSTATIN) 100000 UNIT/ML suspension 500,000 Units  5 mL Oral QID Dianne Dun, NP   500,000 Units at 11/24/15 1013  . ondansetron (ZOFRAN) injection 4 mg  4 mg Intravenous Q6H PRN Donita Brooks, NP      . pantoprazole (PROTONIX) EC tablet 40 mg  40 mg Oral Daily Chesley Mires, MD   40 mg at 11/24/15 1013  . polyvinyl alcohol (LIQUIFILM TEARS) 1.4 % ophthalmic solution 1 drop  1 drop Both Eyes BID PRN Donita Brooks, NP      . predniSONE (DELTASONE) tablet 40 mg  40 mg Oral Q breakfast Donita Brooks, NP   40 mg at 11/24/15 0833  . tiotropium (SPIRIVA) inhalation capsule 18 mcg  18 mcg Inhalation Daily Chesley Mires, MD   18 mcg at 11/23/15 1246     Discharge Medications: Please see discharge summary for a list of discharge medications.  Relevant Imaging Results:  Relevant Lab Results:   Additional Information SSN: [id number]  Charlesa Ehle, [place] Better A, LCSW

## 2015-11-24 NOTE — Progress Notes (Signed)
PCCM PROGRESS NOTE  ADMISSION DATE: 11/18/2015 REFERRING PROVIDER: Dr. Wilson Singer  CC: Short of breath  SUBJECTIVE:  Pt continues to complain of sinus congestion, feels "terrible" today, afraid she needs rehab.  Reports she walked with the CNA yesterday on 2L and sats dropped to 70's - "very frightening"  VITAL SIGNS: BP 134/66 mmHg  Pulse 71  Temp(Src) 98.3 F (36.8 C) (Oral)  Resp 18  Ht 5' (1.524 m)  Wt 83 lb 12.8 oz (38.011 kg)  BMI 16.37 kg/m2  SpO2 99%  INTAKE/OUTPUT: I/O last 3 completed shifts: In: [id number] [P.O.:1440; Other:15] Out: 1350 [Urine:1350]  General: thin, frail elderly woman HEENT: no thrush, small effusion R Ear, otherwise exam normal  Cardiac: regular, no murmur Chest: even/non-labored, lungs bilaterally distant, no wheeze Abd: soft, non tender Ext: no edema Neuro: normal strength Skin: multiple areas of ecchymosis   CMP Latest Ref Rng 11/23/2015 11/19/2015 11/18/2015  Glucose 65 - 99 mg/dL 204(H) 172(H) 148(H)  BUN 6 - 20 mg/dL 25(H) 17 16  Creatinine 0.44 - 1.00 mg/dL 0.91 0.53 0.50  Sodium 135 - 145 mmol/L 147(H) 143 142  Potassium 3.5 - 5.1 mmol/L 5.2(H) 4.3 3.8  Chloride 101 - 111 mmol/L 96(L) 97(L) 92(L)  CO2 22 - 32 mmol/L 42(H) 39(H) 39(H)  Calcium 8.9 - 10.3 mg/dL 9.5 9.1 9.3  Total Protein 6.0 - 8.3 g/dL - - -  Total Bilirubin 0.3 - 1.2 mg/dL - - -  Alkaline Phos 39 - 117 U/L - - -  AST 0 - 37 U/L - - -  ALT 0 - 35 U/L - - -     CBC Latest Ref Rng 11/23/2015 11/19/2015 11/19/2015  WBC 4.0 - 10.5 K/uL 9.5 8.8 -  Hemoglobin 12.0 - 15.0 g/dL 10.4(L) 10.0(L) -  Hematocrit 36.0 - 46.0 % 34.9(L) 31.4(L) 33.6(L)  Platelets 150 - 400 K/uL 193 190 -     No results found.   CULTURES:  ANTIBIOTICS: 1/12 Zithromax >> 1/13 1/13 Levaquin >>   STUDIES:  EVENTS: 1/12 Admit 1/17 Reduce steroids to PO 1/18 Increased steroids back to IV BID  DISCUSSION: 75 yo female former smoker presented with progressive dyspnea and cough from AECOPD that  didn't respond to outpt prednisone/Abx.  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  ASSESSMENT/PLAN:  Acute on chronic hypoxic/hypercapnic respiratory failure. Plan: - adjust oxygen to keep SpO2 88 to 95% - assess ambulatory saturations on 3L and titrate liter flow to determine necessary O2 needed for sats > 88%  AECOPD. Plan: - day 6/7 of levaquin - continue brovana, pulmicort, duoneb - transition to prednisone, 40 mg QD  Rhinitis. Plan: - flonase, claritin  Hx of HTN, HLD. Plan: - continue norvasc, lipitor, ASA, avapro  Hx of anxiety >> significant component of her symptoms. Plan: - PRN xanax  Cerumen build up. Small R Ear Effusion Plan: - debrox - Afrin BID x 4 doses  - Claritin QD   Thrush. Plan: - nystatin  Severe Protein Calorie Malnutrition  Plan: - Ensure TID   Deconditioning. Plan: - PT recommending home health PT with 24 hr supervision/assistance.  Arranged for PT, RN.   - SW consult for SNF / Rehab options   DVT prophylaxis >> SQ heparin SUP >> Protonix while on high dose steroids Goals of care >> full code    Noe Gens, NP-C [facility] Pgr: [phone] or if no answer [phone] 11/24/2015, 11:01 AM

## 2015-11-24 NOTE — NC FL2 (Signed)
[place] LEVEL OF CARE SCREENING TOOL     IDENTIFICATION  Patient Name: [name] Birthdate: [birth year] Sex: female Admission Date (Current Location): 11/18/2015  [facility] and [place] Number:  Herbalist and Address:  [facility],  [place] [address] [place]      Provider Number: [id number]  Attending Physician Name and Address:  Rigoberto Noel, MD  Relative Name and Phone Number:       Current Level of Care: Hospital Recommended Level of Care: [place] Prior Approval Number:    Date Approved/Denied:   PASRR Number: [id number]  Discharge Plan: SNF    Current Diagnoses: Patient Active Problem List   Diagnosis Date Noted  . COPD exacerbation ([place]) 11/18/2015  . Protein-calorie malnutrition, severe ([place]) 03/15/2015  . Acute respiratory distress (HCC) 03/14/2015  . Alcohol abuse   . Acute on chronic respiratory failure ([place]) 12/02/2014  . Activity intolerance 12/02/2014  . Steroid-induced hyperglycemia 12/02/2014  . Chronic respiratory failure ([place]) 07/15/2014  . HTN (hypertension) 10/19/2013  . HLD (hyperlipidemia) 10/19/2013  . Leukocytosis 10/19/2013  . Anemia of chronic disease 10/19/2013  . COPD (chronic obstructive pulmonary disease) with emphysema ([place]) 11/05/2012  . Anemia 10/30/2012  . Anxiety 10/29/2012    Orientation RESPIRATION BLADDER Height & Weight    Self, Time, Situation, Place  O2 (3L) Continent 5' (152.4 cm) 83 lbs.  BEHAVIORAL SYMPTOMS/MOOD NEUROLOGICAL BOWEL NUTRITION STATUS   (no behaviors)  (None) Continent Diet (Diet Regular)  AMBULATORY STATUS COMMUNICATION OF NEEDS Skin   Limited Assist Verbally Normal                       Personal Care Assistance Level of Assistance  Bathing, Feeding, Dressing Bathing Assistance: Limited assistance Feeding assistance: Independent Dressing Assistance: Limited assistance     Functional Limitations Info  Sight, Hearing,  Speech Sight Info: Adequate Hearing Info: Adequate Speech Info: Adequate    SPECIAL CARE FACTORS FREQUENCY  PT (By licensed PT)     PT Frequency: 5 x a week              Contractures Contractures Info: Not present    Additional Factors Info  Code Status, Allergies Code Status Info: FULL code status Allergies Info: Oxycodone-acetaminophen           Current Medications (11/24/2015):  This is the current hospital active medication list Current Facility-Administered Medications  Medication Dose Route Frequency Provider Last Rate Last Dose  . 0.9 %  sodium chloride infusion  250 mL Intravenous PRN Donita Brooks, NP      . acetaminophen (TYLENOL) tablet 650 mg  650 mg Oral Q6H PRN Colbert Coyer, MD   650 mg at 11/24/15 0708  . albuterol (PROVENTIL) (2.5 MG/3ML) 0.083% nebulizer solution 2.5 mg  2.5 mg Nebulization Q2H PRN Donita Brooks, NP   2.5 mg at 11/22/15 0649  . albuterol (PROVENTIL) (2.5 MG/3ML) 0.083% nebulizer solution 2.5 mg  2.5 mg Nebulization Q6H Chesley Mires, MD   2.5 mg at 11/24/15 0216  . ALPRAZolam Duanne Moron) tablet 0.25 mg  0.25 mg Oral TID PRN Donita Brooks, NP   0.25 mg at 11/23/15 2215  . amLODipine (NORVASC) tablet 2.5 mg  2.5 mg Oral q morning - 10a Donita Brooks, NP   2.5 mg at 11/24/15 1014  . aspirin EC tablet 81 mg  81 mg Oral Daily Chesley Mires, MD   81 mg at 11/24/15  1013  . atorvastatin (LIPITOR) tablet 80 mg  80 mg Oral Daily Donita Brooks, NP   80 mg at 11/21/15 0935  . budesonide-formoterol (SYMBICORT) 160-4.5 MCG/ACT inhaler 2 puff  2 puff Inhalation BID Chesley Mires, MD   2 puff at 11/23/15 1957  . calcium carbonate (OSCAL) tablet 1,500 mg  1 tablet Oral BID Donita Brooks, NP   1,500 mg at 11/18/15 2200  . carbamide peroxide (DEBROX) 6.5 % otic solution 5 drop  5 drop Both Ears BID Chesley Mires, MD   5 drop at 11/23/15 2204  . fluticasone (FLONASE) 50 MCG/ACT nasal spray 2 spray  2 spray Each Nare Daily Chesley Mires, MD   2 spray at 11/24/15  1019  . guaiFENesin (MUCINEX) 12 hr tablet 1,200 mg  1,200 mg Oral BID Chesley Mires, MD   1,200 mg at 11/24/15 1013  . heparin injection 5,000 Units  5,000 Units Subcutaneous 3 times per day Donita Brooks, NP   5,000 Units at 11/24/15 0647  . hydrALAZINE (APRESOLINE) injection 10-20 mg  10-20 mg Intravenous Q4H PRN Donita Brooks, NP   10 mg at 11/19/15 2218  . irbesartan (AVAPRO) tablet 300 mg  300 mg Oral Daily Donita Brooks, NP   300 mg at 11/24/15 1013  . lactose free nutrition (BOOST PLUS) liquid 237 mL  237 mL Oral TID WC Satira Anis Ward, RD   237 mL at 11/23/15 1303  . levofloxacin (LEVAQUIN) tablet 250 mg  250 mg Oral Daily Donald Prose Runyon, RPH   250 mg at 11/24/15 1013  . loratadine (CLARITIN) tablet 10 mg  10 mg Oral Daily Chesley Mires, MD   10 mg at 11/24/15 1013  . multivitamin with minerals tablet 1 tablet  1 tablet Oral Daily Donita Brooks, NP   1 tablet at 11/21/15 0935  . nystatin (MYCOSTATIN) 100000 UNIT/ML suspension 500,000 Units  5 mL Oral QID Dianne Dun, NP   500,000 Units at 11/24/15 1013  . ondansetron (ZOFRAN) injection 4 mg  4 mg Intravenous Q6H PRN Donita Brooks, NP      . pantoprazole (PROTONIX) EC tablet 40 mg  40 mg Oral Daily Chesley Mires, MD   40 mg at 11/24/15 1013  . polyvinyl alcohol (LIQUIFILM TEARS) 1.4 % ophthalmic solution 1 drop  1 drop Both Eyes BID PRN Donita Brooks, NP      . predniSONE (DELTASONE) tablet 40 mg  40 mg Oral Q breakfast Donita Brooks, NP   40 mg at 11/24/15 0833  . tiotropium (SPIRIVA) inhalation capsule 18 mcg  18 mcg Inhalation Daily Chesley Mires, MD   18 mcg at 11/23/15 1246     Discharge Medications: Please see discharge summary for a list of discharge medications.  Relevant Imaging Results:  Relevant Lab Results:   Additional Information SSN: [id number]  KIDD, [place] Better A, LCSW

## 2015-11-24 NOTE — Clinical Social Work Note (Signed)
Clinical Social Work Assessment  Patient Details  Name: [name] MRN: [id number] Date of Birth: 1941-08-12  Date of referral:  11/24/15               Reason for consult:  Discharge Planning                Permission sought to share information with:  Family Supports Permission granted to share information::     Name::        Agency::     Relationship::     Contact Information:     Housing/Transportation Living arrangements for the past 2 months:  Single Family Home Source of Information:  Patient Patient Interpreter Needed:  None Criminal Activity/Legal Involvement Pertinent to Current Situation/Hospitalization:  No - Comment as needed Significant Relationships:  None Lives with:  Self Do you feel safe going back to the place where you live?   (pending progress) Need for family participation in patient care:  No (Coment)  Care giving concerns:  Pt lives at home alone. Pt friends are encouraging pt to go to short term rehab, but pt unsure if she wants to go to rehab upon discharge. Pt concerned about her O2 needs at this time and being able to manage those at home.   Social Worker assessment / plan:    Pt called CSW into pt room. Pt familiar with CSW from past hospitalization. Pt reports that she had planned to return home with home health, but she is requiring more oxygen than needed and pt friends are encouraging her to go to short term rehab. Pt discussed that she wants to work with PT again to get a better idea of her needs at home to determine if she feels that she could manage at home living alone. Pt reports that she has been to short term rehab years ago. CSW discussed that CSW can explore options for pt in order for pt to have all of the information to make an informed decision about discharge plan. Pt is agreeable to CSW initiating SNF search in order for pt to have options to consider. Pt expressed that she [facility] a [facility] room at [facility] and CSW discussed that private  rooms would be subject to availability.   CSW completed FL2 and initiated SNF search to [facility]. CSW updated RNCM that pt agreeable to CSW initiating SNF search, but has not yet agreed completely that she will discharge to a SNF when medically ready and is still considering home with home health as well.   CSW to follow up with pt regarding SNF bed offers and continue to discuss pt wishes surrounding disposition.  CSW to continue to follow.   Employment status:  Retired Nurse, adult PT Recommendations:  Home with [place], [place] / Referral to community resources:  [place]  Patient/Family's Response to care:  Pt alert and oriented x 4. Pt hopeful to be able to return home, but expressed that pt friends are encouraging rehab given her O2 needs and pt lives alone. Pt expressed being undecided about discharge plan, but agreeable to CSW initiating SNF search to review options.   Patient/Family's Understanding of and Emotional Response to Diagnosis, Current Treatment, and Prognosis:  Pt displayed that she recognizes that she is requiring more O2 and willing to consider options for rehab as encouraged by pt friends.   Emotional Assessment Appearance:  Appears stated age Attitude/Demeanor/Rapport:  Other (pt appropriate) Affect (typically observed):  Appropriate Orientation:  Oriented to Self, Oriented to Place, Oriented to  Time, Oriented to Situation Alcohol / Substance use:  Not Applicable Psych involvement (Current and /or in the community):  No (Comment)  Discharge Needs  Concerns to be addressed:  Discharge Planning Concerns Readmission within the last 30 days:  No Current discharge risk:  Lives alone Barriers to Discharge:  Continued Medical Work up   Ladell Pier, LCSW 11/24/2015, 10:24 AM [phone]

## 2015-11-24 NOTE — NC FL2 (Signed)
[place] LEVEL OF CARE SCREENING TOOL     IDENTIFICATION  Patient Name: [name] Birthdate: Oct 09, 1941 Sex: female Admission Date (Current Location): 11/18/2015  [facility] and [place] Number:  Herbalist and Address:  [facility],  [place] [address], [place]      Provider Number: [id number]  Attending Physician Name and Address:  Rigoberto Noel, MD  Relative Name and Phone Number:       Current Level of Care: Hospital Recommended Level of Care: [place] Prior Approval Number:    Date Approved/Denied:   PASRR Number: [id number]  Discharge Plan: SNF    Current Diagnoses: Patient Active Problem List   Diagnosis Date Noted  . Acute on chronic respiratory failure with hypercapnia ([place])   . COPD exacerbation ([place]) 11/18/2015  . Protein-calorie malnutrition, severe ([place]) 03/15/2015  . Acute respiratory distress (HCC) 03/14/2015  . Alcohol abuse   . Acute on chronic respiratory failure ([place]) 12/02/2014  . Activity intolerance 12/02/2014  . Steroid-induced hyperglycemia 12/02/2014  . Chronic respiratory failure ([place]) 07/15/2014  . HTN (hypertension) 10/19/2013  . HLD (hyperlipidemia) 10/19/2013  . Leukocytosis 10/19/2013  . Anemia of chronic disease 10/19/2013  . COPD (chronic obstructive pulmonary disease) with emphysema ([place]) 11/05/2012  . Anemia 10/30/2012  . Anxiety 10/29/2012    Orientation RESPIRATION BLADDER Height & Weight    Self, Time, Situation, Place  O2 (3L) Continent 5' (152.4 cm) 83 lbs.  BEHAVIORAL SYMPTOMS/MOOD NEUROLOGICAL BOWEL NUTRITION STATUS   (no behaviors)  (None) Continent Diet (Diet Regular)  AMBULATORY STATUS COMMUNICATION OF NEEDS Skin   Limited Assist Verbally Normal                       Personal Care Assistance Level of Assistance  Bathing, Feeding, Dressing Bathing Assistance: Limited assistance Feeding assistance: Independent Dressing Assistance: Limited  assistance     Functional Limitations Info  Sight, Hearing, Speech Sight Info: Adequate Hearing Info: Adequate Speech Info: Adequate    SPECIAL CARE FACTORS FREQUENCY  PT (By licensed PT)     PT Frequency: 5 x a week              Contractures Contractures Info: Not present    Additional Factors Info  Code Status, Allergies Code Status Info: FULL code status Allergies Info: Oxycodone-acetaminophen           Current Medications (11/24/2015):  This is the current hospital active medication list Current Facility-Administered Medications  Medication Dose Route Frequency Provider Last Rate Last Dose  . 0.9 %  sodium chloride infusion  250 mL Intravenous PRN Donita Brooks, NP      . acetaminophen (TYLENOL) tablet 650 mg  650 mg Oral Q6H PRN Colbert Coyer, MD   650 mg at 11/24/15 0708  . albuterol (PROVENTIL) (2.5 MG/3ML) 0.083% nebulizer solution 2.5 mg  2.5 mg Nebulization Q2H PRN Donita Brooks, NP   2.5 mg at 11/22/15 0649  . albuterol (PROVENTIL) (2.5 MG/3ML) 0.083% nebulizer solution 2.5 mg  2.5 mg Nebulization Q6H Chesley Mires, MD   2.5 mg at 11/24/15 1035  . ALPRAZolam Duanne Moron) tablet 0.25 mg  0.25 mg Oral TID PRN Donita Brooks, NP   0.25 mg at 11/23/15 2215  . amLODipine (NORVASC) tablet 2.5 mg  2.5 mg Oral q morning - 10a Donita Brooks, NP   2.5 mg at 11/24/15 1014  . aspirin EC tablet 81 mg  81 mg  Oral Daily Chesley Mires, MD   81 mg at 11/24/15 1013  . atorvastatin (LIPITOR) tablet 80 mg  80 mg Oral Daily Donita Brooks, NP   80 mg at 11/21/15 0935  . budesonide-formoterol (SYMBICORT) 160-4.5 MCG/ACT inhaler 2 puff  2 puff Inhalation BID Chesley Mires, MD   2 puff at 11/24/15 1035  . calcium carbonate (OSCAL) tablet 1,500 mg  1 tablet Oral BID Donita Brooks, NP   1,500 mg at 11/18/15 2200  . carbamide peroxide (DEBROX) 6.5 % otic solution 5 drop  5 drop Both Ears BID Chesley Mires, MD   5 drop at 11/23/15 2204  . feeding supplement (ENSURE ENLIVE) (ENSURE ENLIVE)  liquid 237 mL  237 mL Oral TID BM Brandi L Ollis, NP      . fluticasone (FLONASE) 50 MCG/ACT nasal spray 2 spray  2 spray Each Nare Daily Chesley Mires, MD   2 spray at 11/24/15 1019  . guaiFENesin (MUCINEX) 12 hr tablet 1,200 mg  1,200 mg Oral BID Chesley Mires, MD   1,200 mg at 11/24/15 1013  . heparin injection 5,000 Units  5,000 Units Subcutaneous 3 times per day Donita Brooks, NP   5,000 Units at 11/24/15 0647  . hydrALAZINE (APRESOLINE) injection 10-20 mg  10-20 mg Intravenous Q4H PRN Donita Brooks, NP   10 mg at 11/19/15 2218  . irbesartan (AVAPRO) tablet 300 mg  300 mg Oral Daily Donita Brooks, NP   300 mg at 11/24/15 1013  . lactose free nutrition (BOOST PLUS) liquid 237 mL  237 mL Oral TID WC Satira Anis Ward, RD   237 mL at 11/23/15 1303  . levofloxacin (LEVAQUIN) tablet 250 mg  250 mg Oral Daily Donald Prose Runyon, RPH   250 mg at 11/24/15 1013  . loratadine (CLARITIN) tablet 10 mg  10 mg Oral Daily Chesley Mires, MD   10 mg at 11/24/15 1013  . methylPREDNISolone sodium succinate (SOLU-MEDROL) 40 mg/mL injection 40 mg  40 mg Intravenous Q12H Donita Brooks, NP      . multivitamin with minerals tablet 1 tablet  1 tablet Oral Daily Donita Brooks, NP   1 tablet at 11/21/15 0935  . nystatin (MYCOSTATIN) 100000 UNIT/ML suspension 500,000 Units  5 mL Oral QID Dianne Dun, NP   500,000 Units at 11/24/15 1013  . ondansetron (ZOFRAN) injection 4 mg  4 mg Intravenous Q6H PRN Donita Brooks, NP      . oxymetazoline (AFRIN) 0.05 % nasal spray 1 spray  1 spray Each Nare BID Donita Brooks, NP      . pantoprazole (PROTONIX) EC tablet 40 mg  40 mg Oral Daily Chesley Mires, MD   40 mg at 11/24/15 1013  . polyvinyl alcohol (LIQUIFILM TEARS) 1.4 % ophthalmic solution 1 drop  1 drop Both Eyes BID PRN Donita Brooks, NP      . tiotropium (SPIRIVA) inhalation capsule 18 mcg  18 mcg Inhalation Daily Chesley Mires, MD   18 mcg at 11/24/15 1035     Discharge Medications: Please see discharge summary for a  list of discharge medications.  Relevant Imaging Results:  Relevant Lab Results:   Additional Information SSN: [id number]  KIDD, Hughes Better A, LCSW

## 2015-11-25 MED ORDER — ALPRAZOLAM 0.25 MG PO TABS: 0.2500 mg | ORAL_TABLET | Freq: Three times a day (TID) | ORAL | Status: DC | PRN

## 2015-11-25 MED ORDER — PREDNISONE 20 MG PO TABS
40.0000 mg | ORAL_TABLET | Freq: Two times a day (BID) | ORAL | Status: DC
  Administered 2015-11-25: 40 mg via ORAL
  Filled 2015-11-25: qty 2

## 2015-11-25 MED ORDER — PREDNISONE 10 MG PO TABS: ORAL_TABLET | ORAL | Status: DC

## 2015-11-25 MED ORDER — CALCIUM CARBONATE 1250 (500 CA) MG PO TABS
1.0000 | ORAL_TABLET | Freq: Two times a day (BID) | ORAL | Status: DC
  Filled 2015-11-25: qty 1

## 2015-11-25 MED ORDER — FLUTICASONE PROPIONATE 50 MCG/ACT NA SUSP: 2.0000 | Freq: Every day | NASAL | Status: DC

## 2015-11-25 MED ORDER — IRBESARTAN 300 MG PO TABS: 300.0000 mg | ORAL_TABLET | Freq: Every day | ORAL | Status: DC

## 2015-11-25 NOTE — Discharge Summary (Signed)
Physician Discharge Summary  Patient ID: [name] MRN: [id number] DOB/AGE: [birth year] 75 y.o.  Admit date: 11/18/2015 Discharge date: 11/25/2015  Problem List Active Problems:   Acute on chronic respiratory failure (HCC)   COPD exacerbation (HCC)   Acute on chronic respiratory failure with hypercapnia (HCC)  HPI: 75 y/o, former smoker (quit 30+ years ago), with PMH of HTN, HLD, osteoporosis, remote ovarian CA and 2L O2 dependent COPD (followed by Dr. Lamonte Sakai, 2001 FEV1 of 0.5L or 23% prediced) who presented to [facility] on 1/12 with a three day history of worsening shortness of breath.  The patient reports she began feeling more short of breath the weekend prior to admission. She attempted to use home nebulizer's with minimal relief. She also noted a non-productive cough. Denies fevers, chills, sputum production. The patient started herself on a prednisone taper on 1/9 without improvement. She was seen by Dr. Lamonte Sakai for an acute visit on 1/10 and prescribed azithromycin and instructed to complete her prednisone taper.   The patient returned home but continued to worsen in regards to shortness of breath. Am of presentation on 1/12, she woke with increased symptoms and activated EMS. EMS evaluation notable for saturations of 84-90%. She was treated with albuterol / atrovent and CPAP. On arrival to ER, she was in respiratory distress. Patient received solumedrol, additional atrovent and BiPAP. ABG evaluation 7.234 and pCO2 of 98. WBC 16.2 (prior steroids). CXR evaluation consistent with changes related to COPD without acute infiltrate.   PCCM called for admission.  Hospital Course:   ANTIBIOTICS: 1/12 Zithromax >> 1/13 1/13 Levaquin >> 1/19  STUDIES:  EVENTS: 1/12 Admit 1/17 Reduce steroids to PO 1/18 Increased steroids back to IV BID 1/19 change steroids to po bid. Possible dc to snf.   DISCUSSION: 75 yo female former smoker presented with progressive dyspnea and cough  from AECOPD that didn't respond to outpt prednisone/Abx.  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  ASSESSMENT/PLAN:  Acute on chronic hypoxic/hypercapnic respiratory failure. Plan: - adjust oxygen to keep SpO2 88 to 95% - assess ambulatory saturations on 3L and titrate liter flow to determine necessary O2 needed for sats > 88%  AECOPD. Plan: - day 7/7 of levaquin - continue brovana, pulmicort, duoneb - transition to prednisone, 40 mg bid with a taper to 20 mg daily  Rhinitis. Plan: - flonase, claritin  Hx of HTN, HLD. Plan: - continue norvasc, lipitor, ASA, avapro  Hx of anxiety >> significant component of her symptoms. Plan: - PRN xanax  Cerumen build up. Small R Ear Effusion Plan: - debrox - Afrin BID x 4 doses  - Claritin QD   Thrush. Plan: - nystatin  Severe Protein Calorie Malnutrition  Plan: - Ensure TID   Deconditioning. Plan: - PT recommending home health PT with 24 hr supervision/assistance. Arranged for PT, RN.  - SW consult for SNF / Rehab options   DVT prophylaxis >> SQ heparin SUP >> Protonix while on high dose steroids Goals of care >> full code  Hypernatremia Hyperkalemia  Follow lytes     Labs at discharge Lab Results  Component Value Date   CREATININE 0.91 11/23/2015   BUN 25* 11/23/2015   NA 147* 11/23/2015   K 5.2* 11/23/2015   CL 96* 11/23/2015   CO2 42* 11/23/2015   Lab Results  Component Value Date   WBC 9.5 11/23/2015  HGB 10.4* 11/23/2015   HCT 34.9* 11/23/2015   MCV 103.9* 11/23/2015   PLT 193 11/23/2015   Lab Results  Component Value Date   ALT 17 12/01/2014   AST 16 12/01/2014   ALKPHOS 86 12/01/2014   BILITOT 0.5 12/01/2014   Lab Results  Component Value Date   INR 0.87 03/14/2015   INR 0.89 11/20/2012   INR 0.9 08/11/2008    Current  radiology studies No results found.  Disposition:  01-Home or Self Care  Discharge Instructions    Discharge to SNF when bed available    Complete by:  As directed             Medication List    STOP taking these medications        BENICAR 40 MG tablet  Generic drug:  olmesartan  Replaced by:  irbesartan 300 MG tablet     EXCEDRIN PO     Fish Oil 1000 MG Caps     olmesartan 40 MG tablet  Commonly known as:  BENICAR      TAKE these medications        albuterol 108 (90 Base) MCG/ACT inhaler  Commonly known as:  PROAIR HFA  Inhale 2 puffs into the lungs every 6 (six) hours as needed for wheezing or shortness of breath.     albuterol (2.5 MG/3ML) 0.083% nebulizer solution  Commonly known as:  PROVENTIL  Take 3 mLs (2.5 mg total) by nebulization every 6 (six) hours as needed. Dx: J43.8     ALPRAZolam 0.25 MG tablet  Commonly known as:  XANAX  Take 1 tablet (0.25 mg total) by mouth at bedtime as needed for sleep.     ALPRAZolam 0.25 MG tablet  Commonly known as:  XANAX  Take 1 tablet (0.25 mg total) by mouth 3 (three) times daily as needed for anxiety.     amLODipine 2.5 MG tablet  Commonly known as:  NORVASC  Take 2.5 mg by mouth every morning.     aspirin 81 MG tablet  Take 81 mg by mouth daily.     atorvastatin 80 MG tablet  Commonly known as:  LIPITOR  Take 80 mg by mouth daily.     budesonide-formoterol 160-4.5 MCG/ACT inhaler  Commonly known as:  SYMBICORT  INHALE 2 PUFFS INTO THE LUNGS 2 (TWO) TIMES DAILY.     CALTRATE 600 1500 (600 Ca) MG Tabs tablet  Generic drug:  calcium carbonate  Take 2 tablets by mouth daily. Reported on 11/18/2015     fluticasone 50 MCG/ACT nasal spray  Commonly known as:  FLONASE  Place 2 sprays into both nostrils daily.     irbesartan 300 MG tablet  Commonly known as:  AVAPRO  Take 1 tablet (300 mg total) by mouth daily.     Lutein 20 MG Caps  Take 20 mg by mouth daily.     multivitamin with minerals Tabs tablet   Take 1 tablet by mouth daily.     predniSONE 10 MG tablet  Commonly known as:  DELTASONE  Take 4 tabs  2 x a day with food x 4 days, then 3 tabs 2 x a day x 4 days, then 2 tabs 2 x a day  x 4 days, then 1 tab daily     SYSTANE 0.4-0.3 % Soln  Generic drug:  Polyethyl Glycol-Propyl Glycol  Apply 1 drop to eye 2 (two) times daily as needed (for dryness).     tiotropium 18 MCG inhalation  capsule  Commonly known as:  SPIRIVA  Place 1 capsule (18 mcg total) into inhaler and inhale daily.           Follow-up Information    Follow up with [facility].   Contact information:   [place] [address] [phone]        Discharged Condition: fair  Time spent on discharge greater than 40 minutes.  Vital signs at Discharge. Temp:  [97.8 F (36.6 C)-97.9 F (36.6 C)] 97.8 F (36.6 C) (01/19 0653) Pulse Rate:  [77-87] 81 (01/19 0653) Resp:  [16-22] 22 (01/19 0653) BP: (139-158)/(55-66) 139/65 mmHg (01/19 0653) SpO2:  [89 %-100 %] 98 % (01/19 0900) Weight:  [80 lb 14.5 oz (36.7 kg)] 80 lb 14.5 oz (36.7 kg) (01/19 [id number]) Office follow up Special Information or instructions.  Follow up with Dr. Lamonte Sakai after discharge from SNF Signed: Richardson Landry Minor ACNP Maryanna Shape PCCM Pager [phone] till 3 pm If no answer page [phone] 11/25/2015, 11:05 AM   Chesley Mires, MD [place] 11/25/2015, 11:32 AM Pager:  [phone] After 3pm call: [phone]

## 2015-11-25 NOTE — Progress Notes (Signed)
Physical Therapy Treatment Patient Details Name: [name] MRN: [id number] DOB: 24-Nov-1940 Today's Date: 11/25/2015    History of Present Illness 75 yo female admitted with COPD exac, acute resp failure. Hx of HTn, ov cancer, osteoporosis, emphysema, osteoporosis.     PT Comments    Improved activity tolerance on today compared to last session. O2 sats 99% 3L O2 at rest; 91% 3L O2 during ambulation. Dyspnea 2-3/4 with ambulation. Discussed d/c plan-pt agreeable to ST [facility].   Follow Up Recommendations  SNF     Equipment Recommendations  None recommended by PT    Recommendations for Other Services       Precautions / Restrictions Precautions Precautions: Fall Precaution Comments: monitor sats Restrictions Weight Bearing Restrictions: No    Mobility  Bed Mobility Overal bed mobility: Modified Independent                Transfers Overall transfer level: Modified independent                  Ambulation/Gait Ambulation/Gait assistance: Min guard;Min assist Ambulation Distance (Feet): 150 Feet (x2)   Gait Pattern/deviations: Step-through pattern;Decreased stride length;Drifts right/left     General Gait Details: good gait speed. Dyspnea 2-3/4. standing rest break after ~75 feet each walk. On 3L O2, sats 91%. Intermittent assist to stabilize. Pt c/o Legs feeling weak.    Stairs            Wheelchair Mobility    Modified Rankin (Stroke Patients Only)       Balance           Standing balance support: During functional activity Standing balance-Leahy Scale: Good                      Cognition Arousal/Alertness: Awake/alert Behavior During Therapy: WFL for tasks assessed/performed Overall Cognitive Status: Within Functional Limits for tasks assessed                      Exercises      General Comments        Pertinent Vitals/Pain Pain Assessment: No/denies pain    Home Living                       Prior Function            PT Goals (current goals can now be found in the care plan section) Progress towards PT goals: Progressing toward goals    Frequency  Min 3X/week    PT Plan Current plan remains appropriate    Co-evaluation             End of Session Equipment Utilized During Treatment: Oxygen Activity Tolerance: Patient limited by fatigue Patient left: in bed;with call bell/phone within reach;with bed alarm set (pt requested to have all 4 rails up)     Time: [id number] PT Time Calculation (min) (ACUTE ONLY): 13 min  Charges:  $Gait Training: 8-22 mins                    G Codes:      [name], MPT Pager: [phone]

## 2015-11-25 NOTE — Progress Notes (Signed)
Report called to [facility] at [facility] that will receive the patient.  Patient will be transported via [facility] to facility.  Patient stable from AM assessment.

## 2015-11-25 NOTE — Care Management Note (Signed)
Case Management Note  Patient Details  Name: JULIE RANFT MRN: [id number] Date of Birth: [birth year]    Expected Discharge Date:   (UNKNOWN)               Expected Discharge Plan:  [place]  In-House Referral:  Clinical Social Work  Discharge planning Services  CM Consult  Post Acute Care Choice:    Choice offered to:     DME Arranged:    DME Agency:     HH Arranged:    [place] Agency:     Status of Service:  Completed, signed off  Medicare Important Message Given:  Yes Date Medicare IM Given:    Medicare IM give by:    Date Additional Medicare IM Given:    Additional Medicare Important Message give by:     If discussed at [place] of Stay Meetings, dates discussed:    Additional Comments:  Lynnell Catalan, RN 11/25/2015, 11:15 AM

## 2015-11-25 NOTE — Progress Notes (Signed)
PCCM PROGRESS NOTE  ADMISSION DATE: 11/18/2015 REFERRING PROVIDER: Dr. Wilson Singer  CC: Short of breath  SUBJECTIVE:   Anxious about taking next step to rehab.  She is concerned they won't let her go home eventually.  VITAL SIGNS: BP 139/65 mmHg  Pulse 81  Temp(Src) 97.8 F (36.6 C) (Oral)  Resp 22  Ht 5' (1.524 m)  Wt 80 lb 14.5 oz (36.7 kg)  BMI 15.80 kg/m2  SpO2 98%  INTAKE/OUTPUT: I/O last 3 completed shifts: In: 1280 [P.O.:1280] Out: 2100 [Urine:2100]  General: thin, frail elderly woman, watching TV, anxiety noted. HEENT: no thrush, otherwise exam normal  Cardiac: regular, no murmur Chest: even/non-labored, lungs bilaterally distant, no wheeze Abd: soft, non tender Ext: no edema Neuro: normal strength Skin: multiple areas of ecchymosis   CMP Latest Ref Rng 11/23/2015 11/19/2015 11/18/2015  Glucose 65 - 99 mg/dL 204(H) 172(H) 148(H)  BUN 6 - 20 mg/dL 25(H) 17 16  Creatinine 0.44 - 1.00 mg/dL 0.91 0.53 0.50  Sodium 135 - 145 mmol/L 147(H) 143 142  Potassium 3.5 - 5.1 mmol/L 5.2(H) 4.3 3.8  Chloride 101 - 111 mmol/L 96(L) 97(L) 92(L)  CO2 22 - 32 mmol/L 42(H) 39(H) 39(H)  Calcium 8.9 - 10.3 mg/dL 9.5 9.1 9.3  Total Protein 6.0 - 8.3 g/dL - - -  Total Bilirubin 0.3 - 1.2 mg/dL - - -  Alkaline Phos 39 - 117 U/L - - -  AST 0 - 37 U/L - - -  ALT 0 - 35 U/L - - -     CBC Latest Ref Rng 11/23/2015 11/19/2015 11/19/2015  WBC 4.0 - 10.5 K/uL 9.5 8.8 -  Hemoglobin 12.0 - 15.0 g/dL 10.4(L) 10.0(L) -  Hematocrit 36.0 - 46.0 % 34.9(L) 31.4(L) 33.6(L)  Platelets 150 - 400 K/uL 193 190 -     No results found.   CULTURES:  ANTIBIOTICS: 1/12 Zithromax >> 1/13 1/13 Levaquin >> 1/19  STUDIES:  EVENTS: 1/12 Admit 1/17 Reduce steroids to PO 1/18 Increased steroids back to IV BID 1/19 change steroids to po bid.  To SNF.  DISCUSSION: 75 yo female former smoker presented with progressive dyspnea and cough from AECOPD that didn't respond to outpt prednisone/Abx.  She has  hx of GOLD 4 COPD/emphysema with FEV1 0.5 (23%) from 2001 on chronic prednisone, chronic hypoxic/hypercapnic respiratory failure on 2 liters oxygen, HTN, HLD, osteoporosis, ovarian cancer.  ASSESSMENT/PLAN:  Acute on chronic hypoxic/hypercapnic respiratory failure. Plan: - adjust oxygen to keep SpO2 88 to 95% - assess ambulatory saturations on 3L and titrate liter flow to determine necessary O2 needed for sats > 88%  AECOPD. Plan: - symbicort, spiriva - transition to prednisone, 40 mg bid and wean to baseline dose of 10 mg daily as tolerated  Rhinitis. Plan: - flonase, claritin  Hx of HTN, HLD. Plan: - continue norvasc, lipitor, ASA, avapro  Hx of anxiety >> significant component of her symptoms. Plan: - PRN xanax  Cerumen build up. Small R Ear Effusion Plan: - debrox - Afrin BID x 4 doses  - Claritin QD   Thrush. Plan: - nystatin  Severe Protein Calorie Malnutrition  Plan: - Ensure TID   Deconditioning. Plan: - to [facility] 11/25/15  Hypernatremia. Hyperkalemia. - Follow lytes  Goals of care >> full code  [place] PCCM Pager [phone] till 3 pm If no answer page [phone] 11/25/2015, 10:41 AM   Reviewed above, examined.  She is anxious.  Not coughing much.  HR regular.  No wheeze.  Abdomen soft.  Proceed with transfer for SNF.  Updated pt's family at bedside.  Chesley Mires, MD [facility] Pulmonary/Critical Care 11/25/2015, 11:14 AM Pager:  [phone] After 3pm call: [phone]

## 2015-12-01 ENCOUNTER — Non-Acute Institutional Stay (SKILLED_NURSING_FACILITY): Payer: Medicare Other | Admitting: Internal Medicine

## 2015-12-01 DIAGNOSIS — J441 Chronic obstructive pulmonary disease with (acute) exacerbation: Secondary | ICD-10-CM | POA: Diagnosis not present

## 2015-12-01 DIAGNOSIS — I10 Essential (primary) hypertension: Secondary | ICD-10-CM

## 2015-12-01 DIAGNOSIS — J9622 Acute and chronic respiratory failure with hypercapnia: Secondary | ICD-10-CM | POA: Diagnosis not present

## 2015-12-01 DIAGNOSIS — F419 Anxiety disorder, unspecified: Secondary | ICD-10-CM | POA: Diagnosis not present

## 2015-12-01 DIAGNOSIS — B370 Candidal stomatitis: Secondary | ICD-10-CM | POA: Diagnosis not present

## 2015-12-01 DIAGNOSIS — E785 Hyperlipidemia, unspecified: Secondary | ICD-10-CM | POA: Diagnosis not present

## 2015-12-01 DIAGNOSIS — J309 Allergic rhinitis, unspecified: Secondary | ICD-10-CM | POA: Diagnosis not present

## 2015-12-01 DIAGNOSIS — E43 Unspecified severe protein-calorie malnutrition: Secondary | ICD-10-CM

## 2015-12-03 ENCOUNTER — Encounter: Payer: Self-pay | Admitting: Internal Medicine

## 2015-12-03 ENCOUNTER — Non-Acute Institutional Stay (SKILLED_NURSING_FACILITY): Payer: Medicare Other | Admitting: Internal Medicine

## 2015-12-03 DIAGNOSIS — B370 Candidal stomatitis: Secondary | ICD-10-CM

## 2015-12-03 DIAGNOSIS — F411 Generalized anxiety disorder: Secondary | ICD-10-CM

## 2015-12-03 DIAGNOSIS — I10 Essential (primary) hypertension: Secondary | ICD-10-CM | POA: Diagnosis not present

## 2015-12-03 DIAGNOSIS — J9620 Acute and chronic respiratory failure, unspecified whether with hypoxia or hypercapnia: Secondary | ICD-10-CM | POA: Diagnosis not present

## 2015-12-03 NOTE — Progress Notes (Signed)
This encounter was created in error - please disregard.

## 2015-12-03 NOTE — Progress Notes (Signed)
MRN: [id number] Name: [name]  Sex: female Age: 75 y.o. DOB: Jul 31, 1941  [facility] Facility/Room:501 Level Of Care: SNF Provider: Inocencio Homes D Emergency Contacts: Extended Emergency Contact Information Primary Emergency Contact: Stubbs,Jane Address: [address]          [place] [address] [place]te [place] of [place] Phone: [phone] Relation: Friend Secondary Emergency Contact: Otilio Connors [place] of [place] Phone: [phone] Mobile Phone: [phone] Relation: Friend  Code Status:   Allergies: Oxycodone-acetaminophen  Chief Complaint  Patient presents with  . New Admit To SNF    HPI: Patient is 75 y.o. female former smoker (quit 30+ years ago), with PMH of HTN, HLD, osteoporosis, remote ovarian CA and 2L O2 dependent COPD (followed by Dr. Lamonte Sakai, 2001 FEV1 of 0.5L or 23% prediced) who presented to [facility] on 1/12 with a three day history of worsening shortness of breath. Of note, ABG evaluation 7.234 and pCO2 of 98. WBC 16.2 (prior steroids). CXR evaluation consistent with changes related to COPD without acute infiltrate.pt was admitted to [facility] from 1/12-19 for acute on chronic respiratory failure with hypoxia and hypercarbia felt 2/2 acute COPD exacerbation tx with BiPap, solumerdrol atrovent. Pt is admitted to SNF for generalized weakness for OT/PT. While at SNF pt will be followed for HTN, tx with norvasc and avapro, HLD, tx with lipitor and anxiety, tx with prn xanax.   Past Medical History  Diagnosis Date  . Emphysema   . Ovarian cancer on left ([place])   . Hyperlipidemia   . Hypertension   . Blood transfusion without reported diagnosis   . Emphysema of lung ([place])   . Osteoporosis   . SBO (small bowel obstruction) s/p LOA 11/07/2012 11/04/2012  . COPD (chronic obstructive pulmonary disease) with emphysema ([place]) 11/05/2012    Arlyce Harman 2001:  FEV1 0.50, ratio 28 Pulmonary rehab 2013   . Chronic respiratory failure ([place]) 07/15/2014     Past Surgical History  Procedure Laterality Date  . Abdominal hysterectomy    . Laparoscopy  11/07/2012    Procedure: LAPAROSCOPY DIAGNOSTIC;  Surgeon: Ralene Ok, MD;  Location: [facility];  Service: General;  Laterality: N/A;  . Laparotomy  11/07/2012    Procedure: EXPLORATORY LAPAROTOMY;  Surgeon: Ralene Ok, MD;  Location: [facility];  Service: General;  Laterality: N/A;  . Laparoscopic lysis of adhesions  11/07/2012    Procedure: LAPAROSCOPIC LYSIS OF ADHESIONS;  Surgeon: Ralene Ok, MD;  Location: [facility];  Service: General;  Laterality: N/A;  . Lysis of adhesion  11/07/2012    Procedure: LYSIS OF ADHESION;  Surgeon: Ralene Ok, MD;  Location: [facility];  Service: General;;      Medication List       This list is accurate as of: 12/01/15 11:59 PM.  Always use your most recent med list.               albuterol 108 (90 Base) MCG/ACT inhaler  Commonly known as:  PROAIR HFA  Inhale 2 puffs into the lungs every 6 (six) hours as needed for wheezing or shortness of breath.     albuterol (2.5 MG/3ML) 0.083% nebulizer solution  Commonly known as:  PROVENTIL  Take 3 mLs (2.5 mg total) by nebulization every 6 (six) hours as needed. Dx: J43.8     ALPRAZolam 0.25 MG tablet  Commonly known as:  XANAX  Take 1 tablet (0.25 mg total) by mouth at bedtime as needed for sleep.     ALPRAZolam 0.25 MG tablet  Commonly known as:  XANAX  Take 1 tablet (0.25 mg total) by mouth 3 (three) times daily as needed for anxiety.     amLODipine 2.5 MG tablet  Commonly known as:  NORVASC  Take 2.5 mg by mouth every morning.     aspirin 81 MG tablet  Take 81 mg by mouth daily.     atorvastatin 80 MG tablet  Commonly known as:  LIPITOR  Take 80 mg by mouth daily.     budesonide-formoterol 160-4.5 MCG/ACT inhaler  Commonly known as:  SYMBICORT  INHALE 2 PUFFS INTO THE LUNGS 2 (TWO) TIMES DAILY.     CALTRATE 600 1500 (600 Ca) MG Tabs tablet  Generic drug:  calcium carbonate  Take 2  tablets by mouth daily. Reported on 11/18/2015     fluticasone 50 MCG/ACT nasal spray  Commonly known as:  FLONASE  Place 2 sprays into both nostrils daily.     irbesartan 300 MG tablet  Commonly known as:  AVAPRO  Take 1 tablet (300 mg total) by mouth daily.     Lutein 20 MG Caps  Take 20 mg by mouth daily.     multivitamin with minerals Tabs tablet  Take 1 tablet by mouth daily.     predniSONE 10 MG tablet  Commonly known as:  DELTASONE  Take 4 tabs  2 x a day with food x 4 days, then 3 tabs 2 x a day x 4 days, then 2 tabs 2 x a day  x 4 days, then 1 tab daily     SYSTANE 0.4-0.3 % Soln  Generic drug:  Polyethyl Glycol-Propyl Glycol  Apply 1 drop to eye 2 (two) times daily as needed (for dryness).     tiotropium 18 MCG inhalation capsule  Commonly known as:  SPIRIVA  Place 1 capsule (18 mcg total) into inhaler and inhale daily.        No orders of the defined types were placed in this encounter.    Immunization History  Administered Date(s) Administered  . H1N1 11/02/2008  . Influenza Split 06/26/2014  . Influenza Whole 08/05/2008, 07/07/2009, 07/26/2010, 07/08/2012  . Influenza,inj,Quad PF,36+ Mos 07/07/2013, 07/21/2015  . Pneumococcal Conjugate-13 11/06/2004, 11/06/2013    Social History  Substance Use Topics  . Smoking status: Former Smoker -- 1.00 packs/day for 30 years    Types: Cigarettes    Quit date: 11/06/1992  . Smokeless tobacco: Never Used  . Alcohol Use: 0.6 oz/week    1 Glasses of wine per week    Family history is + HTN, MI, DM2    Review of Systems  DATA OBTAINED: from patient, nurse GENERAL:  no fevers, fatigue, appetite changes SKIN: No itching, rash or wounds EYES: No eye pain, redness, discharge EARS: No earache, tinnitus, change in hearing NOSE: No congestion, drainage or bleeding  MOUTH/THROAT: mouth pain, she has thrush again RESPIRATORY: No cough, wheezing, SOB CARDIAC: No chest pain, palpitations, lower extremity edema  GI:  No abdominal pain, No N/V/D or constipation, No heartburn or reflux  GU: No dysuria, frequency or urgency, or incontinence  MUSCULOSKELETAL: No unrelieved bone/joint pain NEUROLOGIC: No headache, dizziness or focal weakness PSYCHIATRIC: No c/o anxiety or sadness   Filed Vitals:   12/03/15 0851  BP: 161/80  Pulse: 113  Temp: 98.8 F (37.1 C)  Resp: 20    SpO2 Readings from Last 1 Encounters:  11/25/15 100%        Physical Exam  GENERAL APPEARANCE: Alert, conversant,  No acute  distress.  SKIN: No diaphoresis rash HEAD: Normocephalic, atraumatic  EYES: Conjunctiva/lids clear. Pupils round, reactive. EOMs intact.  EARS: External exam WNL, canals clear. Hearing grossly normal.  NOSE: No deformity or discharge.  MOUTH/THROAT: hard palate and buccal mucosa with white patches  RESPIRATORY: Breathing is even, unlabored. Lung sounds are clear   CARDIOVASCULAR: Heart RRR no murmurs, rubs or gallops. No peripheral edema.   GASTROINTESTINAL: Abdomen is soft, non-tender, not distended w/ normal bowel sounds. GENITOURINARY: Bladder non tender, not distended  MUSCULOSKELETAL: No abnormal joints or musculature NEUROLOGIC:  Cranial nerves 2-12 grossly intact. Moves all extremities  PSYCHIATRIC: Mood and affect appropriate to situation, no behavioral issues  Patient Active Problem List   Diagnosis Date Noted  . Thrush, oral 12/05/2015  . Allergic rhinitis 12/05/2015  . Acute on chronic respiratory failure with hypercapnia ([place])   . COPD exacerbation ([place]) 11/18/2015  . Protein-calorie malnutrition, severe ([place]) 03/15/2015  . Acute respiratory distress ([facility]) 03/14/2015  . Alcohol abuse   . Acute on chronic respiratory failure ([place]) 12/02/2014  . Activity intolerance 12/02/2014  . Steroid-induced hyperglycemia 12/02/2014  . Chronic respiratory failure ([place]) 07/15/2014  . HTN (hypertension) 10/19/2013  . HLD (hyperlipidemia) 10/19/2013  . Leukocytosis 10/19/2013  . Anemia of chronic  disease 10/19/2013  . COPD (chronic obstructive pulmonary disease) with emphysema ([place]) 11/05/2012  . Anemia 10/30/2012  . Anxiety 10/29/2012    CBC    Component Value Date/Time   WBC 9.5 11/23/2015 0405   RBC 3.36* 11/23/2015 0405   RBC 3.35* 10/30/2012 0934   HGB 10.4* 11/23/2015 0405   HCT 34.9* 11/23/2015 0405   HCT 31.4* 11/19/2015 1027   PLT 193 11/23/2015 0405   MCV 103.9* 11/23/2015 0405   LYMPHSABS 2.2 11/18/2015 0820   MONOABS 1.8* 11/18/2015 0820   EOSABS 0.1 11/18/2015 0820   BASOSABS 0.0 11/18/2015 0820    CMP     Component Value Date/Time   NA 147* 11/23/2015 0405   K 5.2* 11/23/2015 0405   CL 96* 11/23/2015 0405   CO2 42* 11/23/2015 0405   GLUCOSE 204* 11/23/2015 0405   BUN 25* 11/23/2015 0405   CREATININE 0.91 11/23/2015 0405   CALCIUM 9.5 11/23/2015 0405   PROT 5.6* 12/01/2014 0455   ALBUMIN 3.2* 12/01/2014 0455   AST 16 12/01/2014 0455   ALT 17 12/01/2014 0455   ALKPHOS 86 12/01/2014 0455   BILITOT 0.5 12/01/2014 0455   GFRNONAA >60 11/23/2015 0405   GFRAA >60 11/23/2015 0405    Lab Results  Component Value Date   HGBA1C 5.9* 10/29/2012     Dg Chest Port 1 View  11/19/2015  CLINICAL DATA:  COPD exacerbation. EXAM: PORTABLE CHEST 1 VIEW COMPARISON:  11/18/2015 FINDINGS: Atherosclerotic aortic arch. Severe emphysema. Heart size within normal limits. The stable right upper lobe scarring. Subsegmental atelectasis or scarring along the left hemidiaphragm. Bilateral hilar prominence likely from pulmonary arterial hypertension. IMPRESSION: 1. Emphysema with likely secondary pulmonary are home arterial hypertension. 2. Mild scarring or atelectasis in the right upper lobe and left lung base. Electronically Signed   By: Van Clines M.D.   On: 11/19/2015 07:52   Dg Chest Portable 1 View  11/18/2015  CLINICAL DATA:  Shortness of breath for 3 days. Decreased oxygen saturation EXAM: PORTABLE CHEST 1 VIEW COMPARISON:  Mar 14, 2015 FINDINGS: Lungs remain  somewhat hyperexpanded. There is no edema or consolidation. The heart size and pulmonary vascularity within normal limits. No adenopathy. There is atherosclerotic calcification in aorta. No bone  lesions. IMPRESSION: Lungs hyperexpanded without edema or consolidation. Electronically Signed   By: Lowella Grip III M.D.   On: 11/18/2015 08:19    Not all labs, radiology exams or other studies done during hospitalization come through on my EPIC note; however they are reviewed by me.    Assessment and Plan  Acute on chronic respiratory failure with hypercapnia ([facility]) Felt 2/2 acute COPD exacerbation;adjust oxygen to keep SpO2 88 to 95% - assess ambulatory saturations on 3L and titrate liter flow to determine necessary O2 needed for sats > 88% SNF - plan O2 at 3L to [facility] o2 sat > 88%, BUT, not much greater as he is a retainer   COPD exacerbation ([facility]) Gold 4 status, had failed outpt tx; was tx with nebs, steroids levaquin; SNF - cont scheduled and prn nebs, finish steroid taper; titrate O2 for sat > 88%  HTN (hypertension) SNF - reported as controlled on norvasc 2.5 mg daily and avapro 300 mg daily; plan - cnt these meds  HLD (hyperlipidemia) SNF - noot stated as uncontrolled, cont lipitor 80 mg daily  Anxiety Considered a significant component of sx; SNF - cont xanax prn  Protein-calorie malnutrition, severe SNF - protein supplements TID  Thrush, oral SNF - had it in hospital, nystatin not on d/c list, will restart nystatin suspension  Allergic rhinitis SNF - cont with flonase   Time spent > 45 min;> 50% of time with patient was spent reviewing records, labs, tests and studies, counseling and developing plan of care  Hennie Duos, MD

## 2015-12-05 ENCOUNTER — Encounter: Payer: Self-pay | Admitting: Internal Medicine

## 2015-12-05 DIAGNOSIS — J309 Allergic rhinitis, unspecified: Secondary | ICD-10-CM | POA: Insufficient documentation

## 2015-12-05 DIAGNOSIS — B370 Candidal stomatitis: Secondary | ICD-10-CM | POA: Insufficient documentation

## 2015-12-05 NOTE — Assessment & Plan Note (Signed)
Gold 4 status, had failed outpt tx; was tx with nebs, steroids levaquin; SNF - cont scheduled and prn nebs, finish steroid taper; titrate O2 for sat > 88%

## 2015-12-05 NOTE — Assessment & Plan Note (Signed)
SNF - reported as controlled on norvasc 2.5 mg daily and avapro 300 mg daily; plan - cnt these meds

## 2015-12-05 NOTE — Assessment & Plan Note (Signed)
Considered a significant component of sx; SNF - cont xanax prn

## 2015-12-05 NOTE — Assessment & Plan Note (Signed)
SNF - noot stated as uncontrolled, cont lipitor 80 mg daily

## 2015-12-05 NOTE — Assessment & Plan Note (Signed)
SNF - had it in hospital, nystatin not on d/c list, will restart nystatin suspension

## 2015-12-05 NOTE — Assessment & Plan Note (Signed)
Felt 2/2 acute COPD exacerbation;adjust oxygen to keep SpO2 88 to 95% - assess ambulatory saturations on 3L and titrate liter flow to determine necessary O2 needed for sats > 88% SNF - plan O2 at 3L to [facility] o2 sat > 88%, BUT, not much greater as he is a retainer

## 2015-12-05 NOTE — Assessment & Plan Note (Signed)
SNF - protein supplements TID

## 2015-12-05 NOTE — Assessment & Plan Note (Signed)
SNF - cont with flonase

## 2015-12-06 ENCOUNTER — Encounter: Payer: Self-pay | Admitting: Internal Medicine

## 2015-12-06 NOTE — Progress Notes (Signed)
Patient ID: [name], female   DOB: February 16, 1941, 75 y.o.   MRN: [id number] MRN: [id number] Name: [name]  Sex: female Age: 75 y.o. DOB: 09-Aug-1941  [place]  #: [facility] Facility/Room:501 Level Of Care: SNF Provider: Wille Celeste Emergency Contacts: Extended Emergency Contact Information Primary Emergency Contact: Stubbs,Jane Address: [place]           [address] [address] [place] Phone: [phone] Relation: Friend Secondary Emergency Contact: Otilio Connors [place] of [place] Phone: [phone] Mobile Phone: [phone] Relation: Friend  Code Status:   Allergies: Oxycodone-acetaminophen This is a discharge note.  Chief complaint discharge note   HPI: Patient is 75 y.o. female former smoker (quit 30+ years ago), with PMH of HTN, HLD, osteoporosis, remote ovarian CA and 2L O2 dependent COPD (followed by Dr. Lamonte Sakai, 2001 FEV1 of 0.5L or 23% prediced) who presented to [facility] on 1/12 with a three day history of worsening shortness of breath. Of note, ABG evaluation 7.234 and pCO2 of 98. WBC 16.2 (prior steroids). CXR evaluation consistent with changes related to COPD without acute infiltrate.pt was admitted to [facility] from 1/12-19 for acute on chronic respiratory failure with hypoxia and hypercarbia felt 2/2 acute COPD exacerbation tx with BiPap, solumerdrol atrovent. P was is admitted to SNF for generalized weakness for OT/PT. While at SNF pt   will be followed for HTN, tx with norvasc and avapro, HLD, tx with lipitor and anxiety, tx with prn xanax.  Her stay here has been fairly unremarkable she is fairly insistent on going home and has done well.  Respiratory status appears to have stabilized she is completing a prednisone taper.  She does not complain of any increased shortness of breath from baseline.  She will need continued PT and OT at home as well as nursing support for her multiple medical issues-she also will need a  rolling [place] for ambulation secondary to her weakness   Past Medical History  Diagnosis Date  . Emphysema   . Ovarian cancer on left ([place])   . Hyperlipidemia   . Hypertension   . Blood transfusion without reported diagnosis   . Emphysema of lung ([place])   . Osteoporosis   . SBO (small bowel obstruction) s/p LOA 11/07/2012 11/04/2012  . COPD (chronic obstructive pulmonary disease) with emphysema ([place]) 11/05/2012    Arlyce Harman 2001:  FEV1 0.50, ratio 28 Pulmonary rehab 2013   . Chronic respiratory failure ([place]) 07/15/2014    Past Surgical History  Procedure Laterality Date  . Abdominal hysterectomy    . Laparoscopy  11/07/2012    Procedure: LAPAROSCOPY DIAGNOSTIC;  Surgeon: Ralene Ok, MD;  Location: [facility];  Service: General;  Laterality: N/A;  . Laparotomy  11/07/2012    Procedure: EXPLORATORY LAPAROTOMY;  Surgeon: Ralene Ok, MD;  Location: [facility];  Service: General;  Laterality: N/A;  . Laparoscopic lysis of adhesions  11/07/2012    Procedure: LAPAROSCOPIC LYSIS OF ADHESIONS;  Surgeon: Ralene Ok, MD;  Location: [facility];  Service: General;  Laterality: N/A;  . Lysis of adhesion  11/07/2012    Procedure: LYSIS OF ADHESION;  Surgeon: Ralene Ok, MD;  Location: [facility];  Service: General;;      Medication List       This list is accurate as of: 12/03/15 11:59 PM.  Always use your most recent med list.               albuterol 108 (90 Base) MCG/ACT inhaler  Commonly known as:  PROAIR HFA  Inhale 2 puffs into the lungs every 6 (six) hours as needed for wheezing or shortness of breath.     albuterol (2.5 MG/3ML) 0.083% nebulizer solution  Commonly known as:  PROVENTIL  Take 3 mLs (2.5 mg total) by nebulization every 6 (six) hours as needed. Dx: J43.8     ALPRAZolam 0.25 MG tablet  Commonly known as:  XANAX  Take 1 tablet (0.25 mg total) by mouth at bedtime as needed for sleep.     ALPRAZolam 0.25 MG tablet  Commonly known as:  XANAX  Take 1 tablet (0.25 mg total) by  mouth 3 (three) times daily as needed for anxiety.     amLODipine 2.5 MG tablet  Commonly known as:  NORVASC  Take 2.5 mg by mouth every morning.     aspirin 81 MG tablet  Take 81 mg by mouth daily.     atorvastatin 80 MG tablet  Commonly known as:  LIPITOR  Take 80 mg by mouth daily.     budesonide-formoterol 160-4.5 MCG/ACT inhaler  Commonly known as:  SYMBICORT  INHALE 2 PUFFS INTO THE LUNGS 2 (TWO) TIMES DAILY.     CALTRATE 600 1500 (600 Ca) MG Tabs tablet  Generic drug:  calcium carbonate  Take 2 tablets by mouth daily. Reported on 11/18/2015     fluticasone 50 MCG/ACT nasal spray  Commonly known as:  FLONASE  Place 2 sprays into both nostrils daily.     irbesartan 300 MG tablet  Commonly known as:  AVAPRO  Take 1 tablet (300 mg total) by mouth daily.     Lutein 20 MG Caps  Take 20 mg by mouth daily.     multivitamin with minerals Tabs tablet  Take 1 tablet by mouth daily.     predniSONE 10 MG tablet  Commonly known as:  DELTASONE  Take 4 tabs  2 x a day with food x 4 days, then 3 tabs 2 x a day x 4 days, then 2 tabs 2 x a day  x 4 days, then 1 tab daily     SYSTANE 0.4-0.3 % Soln  Generic drug:  Polyethyl Glycol-Propyl Glycol  Apply 1 drop to eye 2 (two) times daily as needed (for dryness).     tiotropium 18 MCG inhalation capsule  Commonly known as:  SPIRIVA  Place 1 capsule (18 mcg total) into inhaler and inhale daily.          Immunization History  Administered Date(s) Administered  . H1N1 11/02/2008  . Influenza Split 06/26/2014  . Influenza Whole 08/05/2008, 07/07/2009, 07/26/2010, 07/08/2012  . Influenza,inj,Quad PF,36+ Mos 07/07/2013, 07/21/2015  . Pneumococcal Conjugate-13 11/06/2004, 11/06/2013    Social History  Substance Use Topics  . Smoking status: Former Smoker -- 1.00 packs/day for 30 years    Types: Cigarettes    Quit date: 11/06/1992  . Smokeless tobacco: Never Used  . Alcohol Use: 0.6 oz/week    1 Glasses of wine per week     Family history is + HTN, MI, DM2    Review of Systems  DATA OBTAINED: from patient, nurse GENERAL:  no fevers, fatigue, appetite changes SKIN: No itching, rash or wounds EYES: No eye pain, redness, discharge EARS: No earache, tinnitus, change in hearing NOSE: No congestion, drainage or bleeding  MOUTH/THROAT: mouth pain, she has thrush--is receiving nystatin for this RESPIRATORY: No cough, wheezing, SOB CARDIAC: No chest pain, palpitations, lower extremity edema  GI: No abdominal pain, No N/V/D or constipation, No  heartburn or reflux  GU: No dysuria, frequency or urgency, or incontinence  MUSCULOSKELETAL: No unrelieved bone/joint pain NEUROLOGIC: No headache, dizziness or focal weakness PSYCHIATRIC: No c/o anxiety or sadness   Filed Vitals:   12/06/15 2232  BP: 124/76  Pulse: 77  Temp: 98.1 F (36.7 C)  Resp: 18    SpO2 Readings from Last 1 Encounters:  11/25/15 100%        Physical Exam  GENERAL APPEARANCE: Alert, conversant,  No acute distress.  SKIN: No diaphoresis rash HEAD: Normocephalic, atraumatic  EYES: Conjunctiva/lids clear. Pupils round, reactive. EOMs intact.  EARS: External exam WNL, canals clear. Hearing grossly normal.  NOSE: No deformity or discharge.  MOUTH/THROAT: hard palate and buccal mucosa with white patches however this appears to be fairly slight at this point I suspect is improving  RESPIRATORY: Breathing is even, unlabored. Lung sounds are clear with shallow air entry  CARDIOVASCULAR: Heart RRR no murmurs, rubs or gallops. No peripheral edema.   GASTROINTESTINAL: Abdomen is sofftnot distended w/ normal bowel sounds.  MUSCULOSKELETAL: No abnormal joints or musculature NEUROLOGIC:  Cranial nerves 2-12 grossly intact. Moves all extremities  PSYCHIATRIC: Mood and affect appropriate to situation, no behavioral issues  Patient Active Problem List   Diagnosis Date Noted  . Thrush, oral 12/05/2015  . Allergic rhinitis 12/05/2015  . Acute  on chronic respiratory failure with hypercapnia ([place])   . COPD exacerbation ([place]) 11/18/2015  . Protein-calorie malnutrition, severe ([place]) 03/15/2015  . Acute respiratory distress ([facility]) 03/14/2015  . Alcohol abuse   . Acute on chronic respiratory failure ([place]) 12/02/2014  . Activity intolerance 12/02/2014  . Steroid-induced hyperglycemia 12/02/2014  . Chronic respiratory failure ([place]) 07/15/2014  . HTN (hypertension) 10/19/2013  . HLD (hyperlipidemia) 10/19/2013  . Leukocytosis 10/19/2013  . Anemia of chronic disease 10/19/2013  . COPD (chronic obstructive pulmonary disease) with emphysema ([place]) 11/05/2012  . Anemia 10/30/2012  . Anxiety 10/29/2012    CBC    Component Value Date/Time   WBC 9.5 11/23/2015 0405   RBC 3.36* 11/23/2015 0405   RBC 3.35* 10/30/2012 0934   HGB 10.4* 11/23/2015 0405   HCT 34.9* 11/23/2015 0405   HCT 31.4* 11/19/2015 1027   PLT 193 11/23/2015 0405   MCV 103.9* 11/23/2015 0405   LYMPHSABS 2.2 11/18/2015 0820   MONOABS 1.8* 11/18/2015 0820   EOSABS 0.1 11/18/2015 0820   BASOSABS 0.0 11/18/2015 0820    CMP     Component Value Date/Time   NA 147* 11/23/2015 0405   K 5.2* 11/23/2015 0405   CL 96* 11/23/2015 0405   CO2 42* 11/23/2015 0405   GLUCOSE 204* 11/23/2015 0405   BUN 25* 11/23/2015 0405   CREATININE 0.91 11/23/2015 0405   CALCIUM 9.5 11/23/2015 0405   PROT 5.6* 12/01/2014 0455   ALBUMIN 3.2* 12/01/2014 0455   AST 16 12/01/2014 0455   ALT 17 12/01/2014 0455   ALKPHOS 86 12/01/2014 0455   BILITOT 0.5 12/01/2014 0455   GFRNONAA >60 11/23/2015 0405   GFRAA >60 11/23/2015 0405    Lab Results  Component Value Date   HGBA1C 5.9* 10/29/2012     Dg Chest Port 1 View  11/19/2015  CLINICAL DATA:  COPD exacerbation. EXAM: PORTABLE CHEST 1 VIEW COMPARISON:  11/18/2015 FINDINGS: Atherosclerotic aortic arch. Severe emphysema. Heart size within normal limits. The stable right upper lobe scarring. Subsegmental atelectasis or scarring along the  left hemidiaphragm. Bilateral hilar prominence likely from pulmonary arterial hypertension. IMPRESSION: 1. Emphysema with likely secondary pulmonary are home  arterial hypertension. 2. Mild scarring or atelectasis in the right upper lobe and left lung base. Electronically Signed   By: Van Clines M.D.   On: 11/19/2015 07:52   Dg Chest Portable 1 View  11/18/2015  CLINICAL DATA:  Shortness of breath for 3 days. Decreased oxygen saturation EXAM: PORTABLE CHEST 1 VIEW COMPARISON:  Mar 14, 2015 FINDINGS: Lungs remain somewhat hyperexpanded. There is no edema or consolidation. The heart size and pulmonary vascularity within normal limits. No adenopathy. There is atherosclerotic calcification in aorta. No bone lesions. IMPRESSION: Lungs hyperexpanded without edema or consolidation. Electronically Signed   By: Lowella Grip III M.D.   On: 11/18/2015 08:19    Not all labs, radiology exams or other studies done during hospitalization come through on my EPIC note; however they are reviewed by me.    Assessment and Plan #1-history of acute on chronic respiratory failure-she appears to have improved here-clinically has been stable apparently she has done relatively well with therapy she will need follow-up by home health at home as well as PT and OT for further strengthening as well as a rolling [place] to help with ambulation.  She continues on albuterol inhaler as needed as well as Proventil nebulizer every 6 hours when necessary.  She is also completing a prednisone taper.  #2 history of hypertension-this appears stable on Norvasc as well as Avapro recent blood pressures 124/70 6-1 24/78.  #3 history of thrush she is completing a course of nystatin she will need to do this for a few more days appears to be slowly resolving.  History of deconditioning again she will need PT and OT as well as a rolling [place] she has pursed tests. Well with therapy but would benefit from continued  therapy.  #5-history of anxiety she continues on Xanax apparently she has been stable in regards to this she does get it 3 times a day as needed as well as at bedtime when necessary.  #6 history of hyperlipidemia she is on a statin have deferred aggressive workup of this secondary to her short stay here.  History of allergic rhinitis she continues on Flonase this does not appear to been a significant issue during her stay here.  Number a history COPD-again she does continue on albuterol inhaler as well as nebulizers when necessary-she also continues routinely on Spiriva daily as well as Symbicort twice a day.  [id number] note greater than 30 minutes spent on this discharge summary-greater than 50% of time spent coordinating plan of care for numerous diagnoses  T   Elmus Mathes C,

## 2015-12-14 ENCOUNTER — Ambulatory Visit (INDEPENDENT_AMBULATORY_CARE_PROVIDER_SITE_OTHER): Payer: Medicare Other | Admitting: Emergency Medicine

## 2015-12-14 ENCOUNTER — Ambulatory Visit: Payer: Medicare Other | Admitting: Emergency Medicine

## 2015-12-14 ENCOUNTER — Encounter: Payer: Self-pay | Admitting: Emergency Medicine

## 2015-12-14 VITALS — BP 122/86 | HR 104 | Wt 77.0 lb

## 2015-12-14 DIAGNOSIS — J438 Other emphysema: Secondary | ICD-10-CM | POA: Diagnosis not present

## 2015-12-14 MED ORDER — PREDNISONE 10 MG PO TABS: ORAL_TABLET | ORAL | Status: DC

## 2015-12-14 MED ORDER — AZITHROMYCIN 250 MG PO TABS: ORAL_TABLET | ORAL | Status: AC

## 2015-12-14 NOTE — Patient Instructions (Addendum)
Please Continue your symbicort and Spiriva  Continue prednisone 10mg  daily Continue your oxygen at 3L/min.  If you need to take prednisone taper or azithromycin then call our office to let us know.  Follow with Dr Lamonte Sakai in 2 months or sooner if you have any problems.

## 2015-12-14 NOTE — Assessment & Plan Note (Addendum)
Discussed her goals for care today. She confirmed for me that she would never want to be on a mechanical ventilator and I wholeheartedly agree. She would be okay with BiPAP and other aggressive means of support. She  does understand that her chances to survive true mechanical ventilation are exceedingly low.  Please Continue your symbicort and Spiriva  Continue prednisone 10mg  daily Continue your oxygen at 3L/min.  If you need to take prednisone taper or azithromycin then call our office to let us know.  Follow with Dr Lamonte Sakai in 2 months or sooner if you have any problems.

## 2015-12-14 NOTE — Progress Notes (Signed)
   Subjective:    Patient ID: [name], female    DOB: Mar 13, 1941, 75 y.o.   MRN: [id number]  HPI 75 year-old former smoker (30 pack years) with a history of hypertension, ovarian cancer, and COPD with chronic hypoxemic respiratory failure that has be followed by Dr Gwenette Greet.  I have personally reviewed her spirometry from 06/14/2000 that showed severe obstruction with FEV1 of 0.5 L or 23% predicted.  I have reviewed her hospital notes and see that she was just in the emergency department on 07/26/15 > some L-mid substernal pain, had a reassuring eval, no evidence for MI. She is on Spiriva + Symbicort, pred 10mg , O2 at 2L/min at all times. Her last exacerbation was on 03/2015.   ROV 09/09/15 --  Follow up today for severe COPD, hypoxemic resp failure. She is on 2 L/min. she desaturates to mid 80's w exertion. We treated her for an AE last visit and tapered pred back to 10mg  daily. She needs a lighter more portable advanced homecare. Her breathing is about the same - the prednisone helped while she was on it.   ROV 11/16/15 -- severe COPD, hypoxemic resp failure. She is on chronic pred 10mg .  She has been doing well until about a week ago. More exertional SOB, more SOB with speaking. She has noticed hoarseness, globus sensation. More cough than usual, has used some tessalon. No sick contact. She started pred taper that I gave her for emergencies on 1/9 > feels a bit better but not to baseline. She remains on spiriva and symbicort, has been using albuterol more, used it 2x yesterday.  No purulence.   ROV 12/14/15 -- patient with a history of COPD and chronic hypoxemic respiratory failure with chronic prednisone use. She's been managed chronically with Spiriva and Symbicort. I saw her in the office 1/10 at which time she had an acute exacerbation for which she had been started on a prednisone taper. I added azithromycin. Unfortunately she was admitted to the hospital on /11/18/15 . She was discharged on 11/25/15.   She is doing better, is not quite back to her usual baseline.  She is on 3L/min.     Review of Systems  As per history of present illness     Objective:   Physical Exam Filed Vitals:   12/14/15 1409  BP: 122/86  Pulse: 104  Weight: 77 lb (34.927 kg)  SpO2: 96%   Gen: Pleasant, thin, in a wheelchair, in no distress,  normal affect  ENT: No lesions,  mouth clear,  oropharynx clear, no postnasal drip  Neck: No JVD, some gravel voice, no stridor  Lungs: No use of accessory muscles, clear without rales or rhonchi, no wheeze  Cardiovascular: RRR, heart sounds normal, no murmur or gallops, no peripheral edema  Musculoskeletal: No deformities, no cyanosis or clubbing  Neuro: alert, non focal  Skin: Warm, no lesions or rashes      Assessment & Plan:  COPD (chronic obstructive pulmonary disease) with emphysema Please Continue your symbicort and Spiriva  Continue prednisone 10mg  daily Continue your oxygen at 3L/min.  If you need to take prednisone taper or azithromycin then call our office to let us know.  Follow with Dr Lamonte Sakai in 2 months or sooner if you have any problems.

## 2015-12-20 ENCOUNTER — Telehealth: Payer: Self-pay | Admitting: Emergency Medicine

## 2015-12-20 NOTE — Telephone Encounter (Signed)
[name] from [facility] calling. Patient has a lot of throat congestion, clearing throat a lot.  Congestion has been affecting her hearing.  Had some mucinex, but ran out and has not taken for a few days.  Heart rate is ranging between 112 and 118.  No tightness in chest, not coughing up anything.  More SOB when walking around the house.  Was given prescription for Zpak and Prednisone.  Wants to know if she needs to start on this?    Dr. Vaughan Browner, please advise in Dr. Agustina Caroli absence.

## 2015-12-20 NOTE — Telephone Encounter (Addendum)
Spoke with Estill Bamberg. Made her aware that we are still waiting for a response from the doctor.  MW - please advise.

## 2015-12-20 NOTE — Telephone Encounter (Signed)
Yes go ahead and start zpak and pred

## 2015-12-20 NOTE — Telephone Encounter (Signed)
Left message for Amanda to call back.

## 2015-12-20 NOTE — Telephone Encounter (Signed)
Pt  Return call [phone] say its urgent that she speakes  To you today.[name]

## 2015-12-21 NOTE — Telephone Encounter (Signed)
Pt states that she feels better and does not feel that she needs to take the ZPAK and Pred Taper at this time. Pt to call if anything further needed.

## 2015-12-23 ENCOUNTER — Telehealth: Payer: Self-pay | Admitting: Emergency Medicine

## 2015-12-23 NOTE — Telephone Encounter (Signed)
Spoke with pt, c/o increased hoarseness, pnd, slight sob X2-3 days.  Denies cough, mucus congestion, fever, chills.   Pt has taken mucinex to help with s/s.   Pt has a zpak and pred taper at home, wants to know if she should go ahead and take this.    Spoke with MW regarding this as it is after 5:00 and neither DOD or RB are in the office.  Per MW, ok for pt to take pred taper, but hold zpak unless she starts having discolored mucus or running a fever.  Pt expressed understanding of this.  Nothing further needed.

## 2016-01-05 ENCOUNTER — Telehealth: Payer: Self-pay | Admitting: Emergency Medicine

## 2016-01-05 NOTE — Telephone Encounter (Signed)
Pt request to only speak to lindsay.

## 2016-01-05 NOTE — Telephone Encounter (Signed)
Spoke with pt. States that she does not know if she should start her antibiotic RB gave her when she was here last. She was instructed by MW not to start the antibiotic until she her mucus became discolored. This not happening at this time. She did blow some discolored mucus out of her nose earlier today but this only happened once. I advised her not to take the antibiotic at this time, not until she starts coughing up discolored mucus. She agreed and that's what she was thinking but wanted to speak to [place] about it first. Nothing further was needed.

## 2016-01-07 ENCOUNTER — Telehealth: Payer: Self-pay | Admitting: Emergency Medicine

## 2016-01-07 NOTE — Telephone Encounter (Signed)
Randa Spike, CMA at 01/05/2016 2:15 PM     Status: Signed       Expand All Collapse All   Spoke with pt. States that she does not know if she should start her antibiotic RB gave her when she was here last. She was instructed by MW not to start the antibiotic until she her mucus became discolored. This is not happening at this time. She did blow some discolored mucus out of her nose earlier today but this only happened once. I advised her not to take the antibiotic at this time, not until she starts coughing up discolored mucus. She agreed and that's what she was thinking but wanted to speak to [place] about it first. Nothing further was needed.       Spoke with pt. She is still having the same symptoms she described earlier this week. Pt would like to go ahead a start the antibiotic she was given when she were last to see RB. Advised her that if feels she needs to do that then that is fine. Also advised her to let us know if she does not improve after finishing the antibiotic. Nothing further was needed at this time.

## 2016-01-08 ENCOUNTER — Telehealth: Payer: Self-pay | Admitting: Critical Care Medicine

## 2016-01-08 NOTE — Telephone Encounter (Signed)
[name] called this AM c/o of increasing SBO.  She reported and review of current notes confirm she had just completed a prednisone taper.  She was instructed to start a Zpak yesterday because of discolored nasal discharge.  She denies F/C/cough.  She has had no chest pain.  She can complete full sentences during our conversation.  She has not increased her home oxygen above the 3L she is normally on.  She has noted some increased SOB with exertion.    She absolutely does not want to come to the hospital.  [name] and I agree that the plan would be as follows: 1. Continue with Zpak 2. Start another prednisone taper.  She is to call the office on Monday for further instructions related to the taper.

## 2016-01-10 ENCOUNTER — Telehealth: Payer: Self-pay | Admitting: Emergency Medicine

## 2016-01-10 NOTE — Telephone Encounter (Signed)
Spoke with pt. States that she called the on call MD over the weekend. There is a telephone encounter on this. Pt is currently taking Zpack and prednisone taper. Complains of producing yellow mucus with cough and SOB. Denies chest tightness, wheezing. She is almost done with Zpack and she is worried that she is still producing discolored mucus. Wants to know if she needs another round of Zpack.  RB - please advise. Thanks.

## 2016-01-10 NOTE — Telephone Encounter (Signed)
Patient calling to speak directly to [place].  Will not tell me what call is about, she says that Ria Comment knows what she is calling about and she doesn't want to talk to anyone but Ria Comment.  To Ria Comment.

## 2016-01-10 NOTE — Telephone Encounter (Signed)
Called made pt aware of RB recs. She verbalized understanding and had no further questions

## 2016-01-10 NOTE — Telephone Encounter (Signed)
The azithromycin is still in her system, she doesn't need another dose. Have her continue and complete the prednisone.

## 2016-02-16 ENCOUNTER — Other Ambulatory Visit: Payer: Self-pay | Admitting: Internal Medicine

## 2016-02-16 DIAGNOSIS — J438 Other emphysema: Secondary | ICD-10-CM

## 2016-02-16 MED ORDER — PREDNISONE 10 MG PO TABS: ORAL_TABLET | ORAL | Status: DC

## 2016-02-17 ENCOUNTER — Other Ambulatory Visit: Payer: Self-pay | Admitting: Emergency Medicine

## 2016-02-17 ENCOUNTER — Telehealth: Payer: Self-pay | Admitting: Emergency Medicine

## 2016-02-17 MED ORDER — PREDNISONE 10 MG PO TABS: ORAL_TABLET | ORAL | Status: DC

## 2016-02-17 NOTE — Telephone Encounter (Signed)
Spoke with pt and after explaining that Ria Comment was not available (she was insisting on only speaking to her), she states that she has been having trouble breathing and is having to take "shorter" breaths. Pt states that she called the o/c dr and was advised that a prednisone taper would be sent in. Pt states that she called the pharmacy multiple times last night and there was no prescription there. Pt was very upset and rude. I explained to her that PR did attempt to send the rx to [facility] but that it did not go through (was actually set to "print"). The pt then says "Well hasn't he ever heard of a phone?" to which I replied that he was o/c for the entire practice and likely did not realize the rx did not go through or he would have corrected it. I offered to send the rx while the pt was on the phone. She then states that she had [facility] in a refill request for her so we should have a fax. I looked in the pt chart and did not see any indication yet of a refill request, so I again offered to send the rx while the pt was on the phone. She finally agreed and I escribed the pred taper. I advised the pt that the rx said it had been received and confirmed by the pharmacy. The pt then says that "if it isn't there when she calls then she will have to call us back". I told her that was fine. Nothing further needed.

## 2016-02-22 ENCOUNTER — Encounter: Payer: Self-pay | Admitting: Emergency Medicine

## 2016-02-22 ENCOUNTER — Ambulatory Visit (INDEPENDENT_AMBULATORY_CARE_PROVIDER_SITE_OTHER): Payer: Medicare Other | Admitting: Emergency Medicine

## 2016-02-22 VITALS — BP 152/100 | HR 110 | Wt 83.0 lb

## 2016-02-22 DIAGNOSIS — J438 Other emphysema: Secondary | ICD-10-CM | POA: Diagnosis not present

## 2016-02-22 MED ORDER — PREDNISONE 10 MG PO TABS: 10.0000 mg | ORAL_TABLET | Freq: Every day | ORAL | Status: DC

## 2016-02-22 MED ORDER — PREDNISONE 10 MG PO TABS: ORAL_TABLET | ORAL | Status: DC

## 2016-02-22 MED ORDER — BUDESONIDE-FORMOTEROL FUMARATE 160-4.5 MCG/ACT IN AERO: INHALATION_SPRAY | RESPIRATORY_TRACT | Status: DC

## 2016-02-22 MED ORDER — PREDNISONE 5 MG PO TABS: 5.0000 mg | ORAL_TABLET | Freq: Every day | ORAL | Status: DC

## 2016-02-22 MED ORDER — TIOTROPIUM BROMIDE MONOHYDRATE 18 MCG IN CAPS: 18.0000 ug | ORAL_CAPSULE | Freq: Every day | RESPIRATORY_TRACT | Status: DC

## 2016-02-22 MED ORDER — ALBUTEROL SULFATE HFA 108 (90 BASE) MCG/ACT IN AERS: 2.0000 | INHALATION_SPRAY | Freq: Four times a day (QID) | RESPIRATORY_TRACT | Status: DC | PRN

## 2016-02-22 MED ORDER — AZITHROMYCIN 250 MG PO TABS: ORAL_TABLET | ORAL | Status: AC

## 2016-02-22 MED ORDER — ALBUTEROL SULFATE (2.5 MG/3ML) 0.083% IN NEBU: 2.5000 mg | INHALATION_SOLUTION | Freq: Four times a day (QID) | RESPIRATORY_TRACT | Status: DC | PRN

## 2016-02-22 NOTE — Patient Instructions (Addendum)
We will increase your daily prednisone to 15mg   We discussed starting daliresp today. We have decided to hold off on making this change at this time.  Please continue your Spiriva and Symbicort as you have been taking them  Use albuterol as needed for shortness of breath Continue your oxygen as you have been using it.  Follow with Dr Lamonte Sakai in 3 months or sooner if you have any problems.

## 2016-02-22 NOTE — Assessment & Plan Note (Signed)
Discussion today regarding her frequent exacerbations. I recommended possibly increasing her prednisone. Also discussed with her potential benefits from daliresp. Given side effects she is not willing to try this at this time. We also had a long discussion regarding CODE STATUS and goals of care. She is clear that she would want to be treated medically and aggressively but that she would not want to be sustained on mechanical ventilation life-support if she were to develop breast failure. I agree with this plan. She was concerned that this would mean that she would not get full medical care - I have reassured her that this is absolutely not the case. I also assured her that if she had dyspnea that could not be controlled with standard medications that we can use narcotics and other means to control her symptoms. After this discussion she agreed that we should leave her CODE STATUS as currently documented, limited code with allowance for BiPAP in the setting of respiratory failure.  I will increase her prednisone 50 mg daily, continue her standard bronchodilators. Again she requests prescriptions for prednisone taper and azithromycin for her to have on hand. I have agreed to give her these results she calls me to let me know if she feels that she needs to take them.

## 2016-02-22 NOTE — Progress Notes (Signed)
Subjective:                      Patient ID: [name], female    DOB: 16-Jul-1941, 75 y.o.   MRN: [id number]  HPI 75 year-old former smoker (30 pack years) with a history of hypertension, ovarian cancer, and COPD with chronic hypoxemic respiratory failure that has be followed by Dr Gwenette Greet.  I have personally reviewed her spirometry from 06/14/2000 that showed severe obstruction with FEV1 of 0.5 L or 23% predicted.  I have reviewed her hospital notes and see that she was just in the emergency department on 07/26/15 > some L-mid substernal pain, had a reassuring eval, no evidence for MI. She is on Spiriva + Symbicort, pred 10mg , O2 at 2L/min at all times. Her last exacerbation was on 03/2015.   ROV 09/09/15 --  Follow up today for severe COPD, hypoxemic resp failure. She is on 2 L/min. she desaturates to mid 80's w exertion. We treated her for an AE last visit and tapered pred back to 10mg  daily. She needs a lighter more portable advanced homecare. Her breathing is about the same - the prednisone helped while she was on it.   ROV 11/16/15 -- severe COPD, hypoxemic resp failure. She is on chronic pred 10mg .  She has been doing well until about a week ago. More exertional SOB, more SOB with speaking. She has noticed hoarseness, globus sensation. More cough than usual, has used some tessalon. No sick contact. She started pred taper that I gave her for emergencies on 1/9 > feels a bit better but not to baseline. She remains on spiriva and symbicort, has been using albuterol more, used it 2x yesterday.  No purulence.   ROV 12/14/15 -- patient with a history of COPD and chronic hypoxemic respiratory failure with chronic prednisone use. She's been managed chronically with Spiriva and Symbicort. I saw her in the office 1/10 at which time she had an acute exacerbation for which she had been started on a prednisone taper. I added azithromycin. Unfortunately she was admitted to the hospital on /11/18/15 . She was  discharged on 11/25/15.  She is doing better, is not quite back to her usual baseline.  She is on 3L/min.    ROV 02/22/16 -- follow-up visit for chronic hypoxemic respiratory failure in the setting of severe COPD. She is on chronic prednisone 10mg . Her last hospital admission was in mid January, she was also treated over the phone with prednisone taper in early March. She is currently on Symbicort and Spiriva. She is using albuterol . Her oxygen is at 3 L/m all times.  Her flares are characterized by exertional shortness of breath, not so much wheeze or cough or mucous. She has never been on daliresp. We had a prolonged discussion today regarding end-of-life care, code status.    Review of Systems  As per history of present illness     Objective:   Physical Exam Filed Vitals:   02/22/16 1407 02/22/16 1408  BP:  152/100  Pulse:  110  Weight: 83 lb (37.649 kg)   SpO2:  99%   Gen: Pleasant, thin, in a wheelchair, in no distress,  normal affect  ENT: No lesions,  mouth clear,  oropharynx clear, no postnasal drip  Neck: No JVD, some gravel voice, no stridor  Lungs: No use of accessory muscles, clear without rales or rhonchi, no wheeze  Cardiovascular: RRR, heart sounds normal, no murmur or gallops, no peripheral  edema  Musculoskeletal: No deformities, no cyanosis or clubbing  Neuro: alert, non focal  Skin: Warm, no lesions or rashes      Assessment & Plan:  COPD (chronic obstructive pulmonary disease) with emphysema Discussion today regarding her frequent exacerbations. I recommended possibly increasing her prednisone. Also discussed with her potential benefits from daliresp. Given side effects she is not willing to try this at this time. We also had a long discussion regarding CODE STATUS and goals of care. She is clear that she would want to be treated medically and aggressively but that she would not want to be sustained on mechanical ventilation life-support if she were to develop  breast failure. I agree with this plan. She was concerned that this would mean that she would not get full medical care - I have reassured her that this is absolutely not the case. I also assured her that if she had dyspnea that could not be controlled with standard medications that we can use narcotics and other means to control her symptoms. After this discussion she agreed that we should leave her CODE STATUS as currently documented, limited code with allowance for BiPAP in the setting of respiratory failure.  I will increase her prednisone 50 mg daily, continue her standard bronchodilators. Again she requests prescriptions for prednisone taper and azithromycin for her to have on hand. I have agreed to give her these results she calls me to let me know if she feels that she needs to take them.   75% of this extended 40 minute visit was spent discussing the severity of her disease, medical regimen, code status, end-of-life issues  Baltazar Apo, MD, PhD 02/22/2016, 2:53 PM [facility] [phone] or if no answer [phone]

## 2016-02-29 ENCOUNTER — Telehealth: Payer: Self-pay | Admitting: Emergency Medicine

## 2016-02-29 NOTE — Telephone Encounter (Signed)
Spoke with pt. States that when she was in the hospital she was increased to 3L/min. Her current order with [facility] is for 2L/min. She is wanting to know if she should continue 3L or go back to 2L. Advised her that she could go back to 2L and continue to monitor her oxygen level. She agreed and verbalized understanding. Nothing further was needed.

## 2016-04-07 ENCOUNTER — Telehealth: Payer: Self-pay | Admitting: Emergency Medicine

## 2016-04-07 NOTE — Telephone Encounter (Signed)
Called spoke with pt. Reviewed RB's recs. Pt voiced understanding and had no further questions. Nothing further needed.

## 2016-04-07 NOTE — Telephone Encounter (Signed)
Spoke with pt. States that she is to going to have a dental procedure next week. Pt is having a filling replaced. She wants to know if the Novocain will interfere with her lung condition.  RB - please advise. Thanks.

## 2016-04-07 NOTE — Telephone Encounter (Signed)
No I do not believe there is any risk that the local anesthetic will affect her breathing

## 2016-04-28 ENCOUNTER — Telehealth: Payer: Self-pay | Admitting: Emergency Medicine

## 2016-04-28 NOTE — Telephone Encounter (Signed)
Yes increase / restart pred burst per prior RB advice To ER if worse

## 2016-04-28 NOTE — Telephone Encounter (Signed)
Spoke with pt, aware of results/recs.  Nothing further needed.  

## 2016-04-28 NOTE — Telephone Encounter (Signed)
Not typically but it might. She can monitor

## 2016-04-28 NOTE — Telephone Encounter (Signed)
Patient notified of Dr. Golden Pop recommendations. Patient given ER precautions if worsened. Nothing further needed.

## 2016-04-28 NOTE — Telephone Encounter (Signed)
Patient calling back because Dr. Chase Caller said that patient should start the Prednisone.  Patient says that her heart rate is 108 and she wanted to know if the Prednisone would make it go higher?  Dr. Chase Caller, please advise.

## 2016-04-28 NOTE — Telephone Encounter (Signed)
Spoke with the pt  She is c/o increased SOB for the past 2 days  She states also having sinus drainage  She states that her heart rate has been elevated with she walks even a short distance (only up to 108) She denies cough, wheezing, CP, chest tightness, F/C/S  She is taking pred 15 mg daily, and has a taper rx at home that she wants to know if she should start RB- please advise, thanks!

## 2016-04-28 NOTE — Telephone Encounter (Signed)
Routing to MR since RB had to cancel office

## 2016-05-01 ENCOUNTER — Telehealth: Payer: Self-pay | Admitting: Emergency Medicine

## 2016-05-01 NOTE — Telephone Encounter (Signed)
Spoke with pt. RB had a cancellation tomorrow morning at 9:15am. Pt has been scheduled for this date and time. Nothing further was needed.

## 2016-05-02 ENCOUNTER — Ambulatory Visit (INDEPENDENT_AMBULATORY_CARE_PROVIDER_SITE_OTHER): Payer: Medicare Other | Admitting: Emergency Medicine

## 2016-05-02 ENCOUNTER — Encounter: Payer: Self-pay | Admitting: Emergency Medicine

## 2016-05-02 VITALS — BP 150/88 | HR 104 | Wt 81.0 lb

## 2016-05-02 DIAGNOSIS — J438 Other emphysema: Secondary | ICD-10-CM | POA: Diagnosis not present

## 2016-05-02 MED ORDER — PREDNISONE 10 MG PO TABS: ORAL_TABLET | ORAL | Status: DC

## 2016-05-02 NOTE — Patient Instructions (Signed)
Please complete your prednisone taper back down to 15mg  daily.  Complete azithromycin Continue your inhaled medications as you have been taking them  Continue your oxygen at 3L/min.  Follow with Dr Lamonte Sakai in 2 months or sooner if you have any problems.

## 2016-05-02 NOTE — Progress Notes (Signed)
Subjective:                      Patient ID: [name], female    DOB: 1940/12/07, 75 y.o.   MRN: [id number]  HPI 75 year-old former smoker (30 pack years) with a history of hypertension, ovarian cancer, and COPD with chronic hypoxemic respiratory failure that has be followed by Dr Gwenette Greet.  I have personally reviewed her spirometry from 06/14/2000 that showed severe obstruction with FEV1 of 0.5 L or 23% predicted.  I have reviewed her hospital notes and see that she was just in the emergency department on 07/26/15 > some L-mid substernal pain, had a reassuring eval, no evidence for MI. She is on Spiriva + Symbicort, pred 10mg , O2 at 2L/min at all times. Her last exacerbation was on 03/2015.   ROV 09/09/15 --  Follow up today for severe COPD, hypoxemic resp failure. She is on 2 L/min. she desaturates to mid 80's w exertion. We treated her for an AE last visit and tapered pred back to 10mg  daily. She needs a lighter more portable advanced homecare. Her breathing is about the same - the prednisone helped while she was on it.   ROV 11/16/15 -- severe COPD, hypoxemic resp failure. She is on chronic pred 10mg .  She has been doing well until about a week ago. More exertional SOB, more SOB with speaking. She has noticed hoarseness, globus sensation. More cough than usual, has used some tessalon. No sick contact. She started pred taper that I gave her for emergencies on 1/9 > feels a bit better but not to baseline. She remains on spiriva and symbicort, has been using albuterol more, used it 2x yesterday.  No purulence.   ROV 12/14/15 -- patient with a history of COPD and chronic hypoxemic respiratory failure with chronic prednisone use. She's been managed chronically with Spiriva and Symbicort. I saw her in the office 1/10 at which time she had an acute exacerbation for which she had been started on a prednisone taper. I added azithromycin. Unfortunately she was admitted to the hospital on /11/18/15 . She was  discharged on 11/25/15.  She is doing better, is not quite back to her usual baseline.  She is on 3L/min.    ROV 02/22/16 -- follow-up visit for chronic hypoxemic respiratory failure in the setting of severe COPD. She is on chronic prednisone 10mg . Her last hospital admission was in mid January, she was also treated over the phone with prednisone taper in early March. She is currently on Symbicort and Spiriva. She is using albuterol . Her oxygen is at 3 L/m all times.  Her flares are characterized by exertional shortness of breath, not so much wheeze or cough or mucous. She has never been on daliresp. We had a prolonged discussion today regarding end-of-life care, code status.   ROV 05/02/16 -- patient has a history of severe COPD with associated chronic hypoxemic respiratory failure and chronic prednisone, increased to 15mg  daily at her last visit. She uses oxygen at 3L/min. Last week she had an increase in her SOB w exertion, no real cough, no mucous. She is having more sinus drainage, clear mucous. She was having sinus pressure. She was started on a pred taper and azithro last week. She is feeling better > still not at her baseline. She is having some anxiety that is feeding into the dyspnea.  She is still concerned that she should not have placed DNR orders in the chart.  She might want it reversed. When pressed she wants to wait to discuss further on July 18 at our [facility]. For now I will leave her partial code   Review of Systems  As per history of present illness     Objective:   Physical Exam Filed Vitals:   05/02/16 0917  BP: 150/88  Pulse: 104  Weight: 81 lb (36.741 kg)  SpO2: 100%   Gen: Pleasant, thin, in a wheelchair, in no distress,  normal affect  ENT: No lesions,  mouth clear,  oropharynx clear, no postnasal drip  Neck: No JVD, some gravel voice, no stridor  Lungs: No use of accessory muscles, clear without rales or rhonchi, no wheeze  Cardiovascular: RRR, heart sounds normal, no  murmur or gallops, no peripheral edema  Musculoskeletal: No deformities, no cyanosis or clubbing  Neuro: alert, non focal  Skin: Warm, no lesions or rashes      Assessment & Plan:  COPD (chronic obstructive pulmonary disease) with emphysema (HCC) Recent acute exacerbation for which she was treated with prednisone taper and azithromycin. Seems to be rebounding although not back to baseline. She will taper back down to prednisone 15 mg daily. Continue same bronchodilators. Again she is very concerned about her CODE STATUS-we had made her limited code at our previous admission. She is concerned that this means that they will try to help her if she decompensates. I tried to reassure her that we would support her in every way except invasively. She is considering reversing her CODE STATUS back to full code because she worries about this frequently. For now should like for me to keep the CODE STATUS as it is, limited code no intubation. We will revisit this in July at our follow-up visit     Baltazar Apo, MD, PhD 05/02/2016, 10:01 AM [facility] [phone] or if no answer [phone]

## 2016-05-02 NOTE — Assessment & Plan Note (Signed)
Recent acute exacerbation for which she was treated with prednisone taper and azithromycin. Seems to be rebounding although not back to baseline. She will taper back down to prednisone 15 mg daily. Continue same bronchodilators. Again she is very concerned about her CODE STATUS-we had made her limited code at our previous admission. She is concerned that this means that they will try to help her if she decompensates. I tried to reassure her that we would support her in every way except invasively. She is considering reversing her CODE STATUS back to full code because she worries about this frequently. For now should like for me to keep the CODE STATUS as it is, limited code no intubation. We will revisit this in July at our follow-up visit

## 2016-05-23 ENCOUNTER — Encounter: Payer: Self-pay | Admitting: Emergency Medicine

## 2016-05-23 ENCOUNTER — Ambulatory Visit (INDEPENDENT_AMBULATORY_CARE_PROVIDER_SITE_OTHER): Payer: Medicare Other | Admitting: Emergency Medicine

## 2016-05-23 VITALS — BP 124/72 | HR 105 | Wt 80.0 lb

## 2016-05-23 DIAGNOSIS — J438 Other emphysema: Secondary | ICD-10-CM | POA: Diagnosis not present

## 2016-05-23 MED ORDER — ALBUTEROL SULFATE (2.5 MG/3ML) 0.083% IN NEBU: 2.5000 mg | INHALATION_SOLUTION | Freq: Four times a day (QID) | RESPIRATORY_TRACT | Status: DC | PRN

## 2016-05-23 MED ORDER — ALBUTEROL SULFATE HFA 108 (90 BASE) MCG/ACT IN AERS: 2.0000 | INHALATION_SPRAY | Freq: Four times a day (QID) | RESPIRATORY_TRACT | Status: AC | PRN

## 2016-05-23 MED ORDER — TIOTROPIUM BROMIDE MONOHYDRATE 18 MCG IN CAPS: 18.0000 ug | ORAL_CAPSULE | Freq: Every day | RESPIRATORY_TRACT | Status: AC

## 2016-05-23 MED ORDER — PREDNISONE 5 MG PO TABS: 5.0000 mg | ORAL_TABLET | Freq: Every day | ORAL | Status: DC

## 2016-05-23 MED ORDER — BUDESONIDE-FORMOTEROL FUMARATE 160-4.5 MCG/ACT IN AERO: INHALATION_SPRAY | RESPIRATORY_TRACT | Status: AC

## 2016-05-23 NOTE — Progress Notes (Signed)
History of Present Illness [name] is a 75 y.o. female former smoker ( 30 pack years) with severe COPD, and hypoxic respiratory failure on oxygen at 2L [place], on chronic prednisone 15 mg daily.She is a former Immunologist patient now followed by Dr. Lamonte Sakai..   7/18/2017Follow Up Appointment: Pt presents for follow up appointment of mild exacerbation of her COPD. She was treated with a prednisone taper and she has completed her azithromycin.She states she is at her baseline. She is wearing her oxygen at 3 L [place]. She is compliant with her Proventil neb treatments about once daily. She states the humidity makes her dyspnea worse.She is compliant with her Symbicort and Spiriva. She denies fever, chest pain, purulent secretions, orthopnea or hemoptysis. She states she does have some nasal dainage but does not want to take Flonase. She also does not want to try claritin. She states that she rarely coughs.She feels she is at her baseline    Past medical hx Past Medical History  Diagnosis Date  . Emphysema   . Ovarian cancer on left ([place])   . Hyperlipidemia   . Hypertension   . Blood transfusion without reported diagnosis   . Emphysema of lung ([place])   . Osteoporosis   . SBO (small bowel obstruction) s/p LOA 11/07/2012 11/04/2012  . COPD (chronic obstructive pulmonary disease) with emphysema ([place]) 11/05/2012    Arlyce Harman 2001:  FEV1 0.50, ratio 28 Pulmonary rehab 2013   . Chronic respiratory failure ([place]) 07/15/2014     Past surgical hx, Family hx, Social hx all reviewed.  Current Outpatient Prescriptions on File Prior to Visit  Medication Sig  . albuterol (PROVENTIL) (2.5 MG/3ML) 0.083% nebulizer solution Take 3 mLs (2.5 mg total) by nebulization every 6 (six) hours as needed for wheezing or shortness of breath. Dx: J43.8  . ALPRAZolam (XANAX) 0.25 MG tablet Take 1 tablet (0.25 mg total) by mouth at bedtime as needed for sleep.  Marland Kitchen amLODipine (NORVASC) 2.5 MG tablet Take 2.5 mg by mouth every morning.    Marland Kitchen aspirin 81 MG tablet Take 81 mg by mouth daily.    Marland Kitchen atorvastatin (LIPITOR) 80 MG tablet Take 80 mg by mouth daily.    . budesonide-formoterol (SYMBICORT) 160-4.5 MCG/ACT inhaler INHALE 2 PUFFS INTO THE LUNGS 2 (TWO) TIMES DAILY.  . Calcium Carbonate (CALTRATE 600) 1500 MG TABS Take 2 tablets by mouth daily. Reported on 11/18/2015  . Lutein 20 MG CAPS Take 20 mg by mouth daily.   . Multiple Vitamin (MULTIVITAMIN WITH MINERALS) TABS tablet Take 1 tablet by mouth daily.  Marland Kitchen olmesartan (BENICAR) 40 MG tablet Take 40 mg by mouth daily.  Vladimir Faster Glycol-Propyl Glycol (SYSTANE) 0.4-0.3 % SOLN Apply 1 drop to eye 2 (two) times daily as needed (for dryness).   . predniSONE (DELTASONE) 10 MG tablet Take 1 tablet (10 mg total) by mouth daily with breakfast.  . predniSONE (DELTASONE) 10 MG tablet Take 4 tablets for 3 days, 3 tablets for 3 days, 2 tablets for 3 days, 1 tablet for 3 days  . predniSONE (DELTASONE) 5 MG tablet Take 1 tablet (5 mg total) by mouth daily with breakfast.  . tiotropium (SPIRIVA) 18 MCG inhalation capsule Place 1 capsule (18 mcg total) into inhaler and inhale daily.   No current facility-administered medications on file prior to visit.     Allergies  Allergen Reactions  . Oxycodone-Acetaminophen Nausea Only and Other (See Comments)    Headaches     Review Of Systems:  Constitutional:   No  weight loss, night sweats,  Fevers, chills, fatigue, or  lassitude.  HEENT:   No headaches,  Difficulty swallowing,  Tooth/dental problems, or  Sore throat,                No sneezing, itching, ear ache, + nasal congestion, +post nasal drip,   CV:  No chest pain,  Orthopnea, PND, +swelling in lower extremities, anasarca, dizziness, palpitations, syncope.   GI  No heartburn, indigestion, abdominal pain, nausea, vomiting, diarrhea, change in bowel habits, loss of appetite, bloody stools.   Resp: + shortness of breath with exertion less  at rest.  No excess mucus, no productive  cough,  No non-productive cough,  No coughing up of blood.  No change in color of mucus.  + wheezing.  No chest wall deformity  Skin: no rash or lesions.  GU: no dysuria, change in color of urine, no urgency or frequency.  No flank pain, no hematuria   MS:  No joint pain or swelling.  No decreased range of motion.  No back pain.  Psych:  No change in mood or affect. No depression or anxiety.  No memory loss.   Vital Signs BP 124/72 mmHg  Pulse 105  Wt 80 lb (36.288 kg)  SpO2 98%   Physical Exam:  General- No distress,  A&Ox3, thin female on nasal oxygen ENT: No sinus tenderness, TM clear, pale nasal mucosa, no oral exudate,no post nasal drip, no LAN Cardiac: S1, S2, regular rate and rhythm, no murmur Chest: + wheeze/no  rales/ dullness; no accessory muscle use, no nasal flaring, no sternal retractions Abd.: Soft Non-tender Ext: No clubbing cyanosis, trace edema Neuro:  normal strength Skin: No rashes, warm and dry Psych: normal mood and behavior   Assessment/Plan  COPD (chronic obstructive pulmonary disease) with emphysema (HCC) Resolved COPD exacerbation: Per patient she is at her baseline Plan: Continue taking your prednisone as you have been doing. Continue taking your Symbicort and Spiriva as you have been doing. Rinse your mouth after use. Continue using your albuterol nebs  For shortness of breath and wheezing as needed up to every 6 hours. Remember to get your flu shot this fall. Follow up with Dr. Lamonte Sakai in 3 months Please contact office for sooner follow up if symptoms do not improve or worsen or seek emergency care       Magdalen Spatz, NP 05/23/2016  3:10 PM   Attending Note:  I have examined patient, reviewed labs, studies and notes. I have discussed the case with [name], and I agree with the data and plans as amended above. 75 year old woman well known to me, has a history of chronic hypoxemic respiratory failure in the setting of severe COPD. At her  last visit she was recovering from an acute exacerbation for which she was treated with prednisone taper. She is now back on her usual baseline 5 mg daily. We will plan to continue her current regimen as detailed above. I discussed CODE STATUS with her today. She had been concerned that by limiting her CODE STATUS she will is going to be denied standard medical care or treatment of her dyspnea. I reassured her that this is not the case. She's had time to think about this and agrees with our previous documentation which is that she would not undergo mechanical ventilation in the event of respiratory failure when he coughs. She would of course want standard medical therapy for an exacerbation and would want  palliative therapy if indicated for refractory dyspnea.   Baltazar Apo, MD, PhD 05/23/2016, 3:27 PM  [facility] [phone] or if no answer [phone]

## 2016-05-23 NOTE — Assessment & Plan Note (Signed)
Resolved COPD exacerbation: Per patient she is at her baseline Plan: Continue taking your prednisone as you have been doing. Continue taking your Symbicort and Spiriva as you have been doing. Rinse your mouth after use. Continue using your albuterol nebs  For shortness of breath and wheezing as needed up to every 6 hours. Remember to get your flu shot this fall. Follow up with Dr. Lamonte Sakai in 3 months Please contact office for sooner follow up if symptoms do not improve or worsen or seek emergency care

## 2016-05-23 NOTE — Patient Instructions (Addendum)
It is very nice to meet you today.  I am glad you are feeling better. Continue taking your prednisone as you have been doing. Continue taking your Symbicort and Spiriva as you have been doing. Rinse your mouth after use. Continue using your albuterol nebs  For shortness of breath and wheezing as needed up to every 6 hours. Remember to get your flu shot this fall. Follow up with Dr. Lamonte Sakai in 3 months Please contact office for sooner follow up if symptoms do not improve or worsen or seek emergency care

## 2016-05-24 ENCOUNTER — Telehealth: Payer: Self-pay | Admitting: Emergency Medicine

## 2016-05-24 NOTE — Telephone Encounter (Signed)
Pt needs you to call her back again. Needs to ask you something else

## 2016-05-24 NOTE — Telephone Encounter (Signed)
Ok to order > Take 40mg  daily for 3 days, then 30mg  daily for 3 days, then 20mg  daily for 3 days, then 10mg  daily for 3 days, then stop

## 2016-05-24 NOTE — Telephone Encounter (Signed)
Pt already has pred taper at home. She was calling to get RB's approval before taking.  I have spoken with the pt and made her aware of RB's recommendation. Nothing further was needed.

## 2016-05-24 NOTE — Telephone Encounter (Signed)
Spoke with pt. States since being seen by RB yesterday her SOB and chest tightness have progressed. Reports increased SOB with talking and walking. Pt is wanting to start taking a prednisone taper. RB gives her a prednisone taper every so often to take when she has a flare up. Pt states that she doesn't want to end up in the hospital.  RB - please advise. Thanks.

## 2016-05-25 ENCOUNTER — Telehealth: Payer: Self-pay | Admitting: Emergency Medicine

## 2016-05-25 NOTE — Telephone Encounter (Signed)
Ok thank you 

## 2016-05-25 NOTE — Telephone Encounter (Signed)
Called spoke with pt. She states that she was given recs from [facility] on 05/24/16 to start a pred taper. She states she is calling to inform him that she did not need to start the pred taper. She explained that her breathing improved late yesterday evening and she felt that the pred was not necessary. She requested a message be sent to RB to make him aware that the pred taper was not taken. I explained to her that I would send the message today. She voiced understanding and had no further questions.   Will send message to [place] as FYI

## 2016-07-07 ENCOUNTER — Telehealth: Payer: Self-pay | Admitting: Emergency Medicine

## 2016-07-07 DIAGNOSIS — J441 Chronic obstructive pulmonary disease with (acute) exacerbation: Secondary | ICD-10-CM

## 2016-07-07 HISTORY — DX: Chronic obstructive pulmonary disease with (acute) exacerbation: J44.1

## 2016-07-07 NOTE — Telephone Encounter (Signed)
Spoke with patient-states would prefer to speak directly with RB's nurse on Tuesday about medications so there is no confusion with questions/concerns. Will route to [facility] for Tuesday.   Pt refused to discuss any matters with anyone else.

## 2016-07-07 NOTE — Telephone Encounter (Signed)
Pt will call back Tuesday no need for a call today

## 2016-07-11 ENCOUNTER — Emergency Department (HOSPITAL_COMMUNITY): Payer: Medicare Other

## 2016-07-11 ENCOUNTER — Telehealth: Payer: Self-pay | Admitting: Emergency Medicine

## 2016-07-11 ENCOUNTER — Ambulatory Visit: Payer: Medicare Other | Admitting: Emergency Medicine

## 2016-07-11 ENCOUNTER — Inpatient Hospital Stay (HOSPITAL_COMMUNITY)
Admission: EM | Admit: 2016-07-11 | Discharge: 2016-07-15 | DRG: 190 | Disposition: A | Discharge status: Skilled Nursing Facility | Payer: Medicare Other | Attending: Internal Medicine | Admitting: Internal Medicine

## 2016-07-11 ENCOUNTER — Encounter (HOSPITAL_COMMUNITY): Payer: Self-pay | Admitting: Emergency Medicine

## 2016-07-11 DIAGNOSIS — F419 Anxiety disorder, unspecified: Secondary | ICD-10-CM

## 2016-07-11 DIAGNOSIS — R6889 Other general symptoms and signs: Secondary | ICD-10-CM | POA: Diagnosis present

## 2016-07-11 DIAGNOSIS — E43 Unspecified severe protein-calorie malnutrition: Secondary | ICD-10-CM | POA: Diagnosis present

## 2016-07-11 DIAGNOSIS — J9622 Acute and chronic respiratory failure with hypercapnia: Secondary | ICD-10-CM

## 2016-07-11 DIAGNOSIS — E785 Hyperlipidemia, unspecified: Secondary | ICD-10-CM | POA: Diagnosis present

## 2016-07-11 DIAGNOSIS — M4854XA Collapsed vertebra, not elsewhere classified, thoracic region, initial encounter for fracture: Secondary | ICD-10-CM | POA: Diagnosis present

## 2016-07-11 DIAGNOSIS — E119 Type 2 diabetes mellitus without complications: Secondary | ICD-10-CM

## 2016-07-11 DIAGNOSIS — J9611 Chronic respiratory failure with hypoxia: Secondary | ICD-10-CM | POA: Diagnosis not present

## 2016-07-11 DIAGNOSIS — I10 Essential (primary) hypertension: Secondary | ICD-10-CM | POA: Diagnosis present

## 2016-07-11 DIAGNOSIS — Z8543 Personal history of malignant neoplasm of ovary: Secondary | ICD-10-CM | POA: Diagnosis not present

## 2016-07-11 DIAGNOSIS — R0602 Shortness of breath: Secondary | ICD-10-CM | POA: Diagnosis present

## 2016-07-11 DIAGNOSIS — Z66 Do not resuscitate: Secondary | ICD-10-CM | POA: Diagnosis present

## 2016-07-11 DIAGNOSIS — Z8249 Family history of ischemic heart disease and other diseases of the circulatory system: Secondary | ICD-10-CM

## 2016-07-11 DIAGNOSIS — Z79899 Other long term (current) drug therapy: Secondary | ICD-10-CM | POA: Diagnosis not present

## 2016-07-11 DIAGNOSIS — R609 Edema, unspecified: Secondary | ICD-10-CM | POA: Diagnosis not present

## 2016-07-11 DIAGNOSIS — Z87891 Personal history of nicotine dependence: Secondary | ICD-10-CM

## 2016-07-11 DIAGNOSIS — Z90710 Acquired absence of both cervix and uterus: Secondary | ICD-10-CM | POA: Diagnosis not present

## 2016-07-11 DIAGNOSIS — J441 Chronic obstructive pulmonary disease with (acute) exacerbation: Secondary | ICD-10-CM | POA: Diagnosis present

## 2016-07-11 DIAGNOSIS — M810 Age-related osteoporosis without current pathological fracture: Secondary | ICD-10-CM | POA: Diagnosis present

## 2016-07-11 DIAGNOSIS — Z9981 Dependence on supplemental oxygen: Secondary | ICD-10-CM | POA: Diagnosis not present

## 2016-07-11 DIAGNOSIS — Z7982 Long term (current) use of aspirin: Secondary | ICD-10-CM

## 2016-07-11 DIAGNOSIS — D72829 Elevated white blood cell count, unspecified: Secondary | ICD-10-CM | POA: Diagnosis present

## 2016-07-11 DIAGNOSIS — J9610 Chronic respiratory failure, unspecified whether with hypoxia or hypercapnia: Secondary | ICD-10-CM | POA: Diagnosis present

## 2016-07-11 DIAGNOSIS — E872 Acidosis: Secondary | ICD-10-CM | POA: Diagnosis present

## 2016-07-11 DIAGNOSIS — J9621 Acute and chronic respiratory failure with hypoxia: Secondary | ICD-10-CM | POA: Diagnosis present

## 2016-07-11 DIAGNOSIS — D638 Anemia in other chronic diseases classified elsewhere: Secondary | ICD-10-CM | POA: Diagnosis present

## 2016-07-11 DIAGNOSIS — Z681 Body mass index (BMI) 19 or less, adult: Secondary | ICD-10-CM | POA: Diagnosis not present

## 2016-07-11 LAB — BASIC METABOLIC PANEL
ANION GAP: 8 (ref 5–15)
BUN: 16 mg/dL (ref 6–20)
CALCIUM: 10.2 mg/dL (ref 8.9–10.3)
CO2: 43 mmol/L — AB (ref 22–32)
Chloride: 86 mmol/L — ABNORMAL LOW (ref 101–111)
Creatinine, Ser: 0.54 mg/dL (ref 0.44–1.00)
Glucose, Bld: 140 mg/dL — ABNORMAL HIGH (ref 65–99)
Potassium: 4.1 mmol/L (ref 3.5–5.1)
Sodium: 137 mmol/L (ref 135–145)

## 2016-07-11 LAB — BLOOD GAS, VENOUS
Acid-Base Excess: 17 mmol/L — ABNORMAL HIGH (ref 0.0–2.0)
Bicarbonate: 45.7 mmol/L — ABNORMAL HIGH (ref 20.0–28.0)
O2 Content: 4 L/min
O2 SAT: 87.1 %
PCO2 VEN: 84.2 mmHg — AB (ref 44.0–60.0)
PH VEN: 7.354 (ref 7.250–7.430)
Patient temperature: 98.6
pO2, Ven: 55.6 mmHg — ABNORMAL HIGH (ref 32.0–45.0)

## 2016-07-11 LAB — CBC
HCT: 36.4 % (ref 36.0–46.0)
HEMOGLOBIN: 11 g/dL — AB (ref 12.0–15.0)
MCH: 30.6 pg (ref 26.0–34.0)
MCHC: 30.2 g/dL (ref 30.0–36.0)
MCV: 101.1 fL — ABNORMAL HIGH (ref 78.0–100.0)
Platelets: 260 10*3/uL (ref 150–400)
RBC: 3.6 MIL/uL — AB (ref 3.87–5.11)
RDW: 13.6 % (ref 11.5–15.5)
WBC: 23.4 10*3/uL — AB (ref 4.0–10.5)

## 2016-07-11 LAB — I-STAT TROPONIN, ED: TROPONIN I, POC: 0 ng/mL (ref 0.00–0.08)

## 2016-07-11 LAB — D-DIMER, QUANTITATIVE (NOT AT ARMC): D DIMER QUANT: 0.49 ug{FEU}/mL (ref 0.00–0.50)

## 2016-07-11 MED ORDER — ENOXAPARIN SODIUM 30 MG/0.3ML ~~LOC~~ SOLN
30.0000 mg | SUBCUTANEOUS | Status: DC
  Administered 2016-07-11 – 2016-07-14 (×4): 30 mg via SUBCUTANEOUS
  Filled 2016-07-11 (×4): qty 0.3

## 2016-07-11 MED ORDER — LUTEIN 20 MG PO CAPS: 20.0000 mg | ORAL_CAPSULE | Freq: Every day | ORAL | Status: DC

## 2016-07-11 MED ORDER — AMLODIPINE BESYLATE 5 MG PO TABS
2.5000 mg | ORAL_TABLET | Freq: Every morning | ORAL | Status: DC
  Administered 2016-07-12 – 2016-07-15 (×4): 2.5 mg via ORAL
  Filled 2016-07-11 (×4): qty 1

## 2016-07-11 MED ORDER — CALCIUM CARBONATE 1250 (500 CA) MG PO TABS
1.0000 | ORAL_TABLET | Freq: Two times a day (BID) | ORAL | Status: DC
  Administered 2016-07-11 – 2016-07-15 (×8): 500 mg via ORAL
  Filled 2016-07-11 (×8): qty 1

## 2016-07-11 MED ORDER — ONDANSETRON HCL 4 MG PO TABS: 4.0000 mg | ORAL_TABLET | Freq: Four times a day (QID) | ORAL | Status: DC | PRN

## 2016-07-11 MED ORDER — ADULT MULTIVITAMIN W/MINERALS CH
1.0000 | ORAL_TABLET | Freq: Every day | ORAL | Status: DC
  Administered 2016-07-11 – 2016-07-14 (×4): 1 via ORAL
  Filled 2016-07-11 (×3): qty 1

## 2016-07-11 MED ORDER — SODIUM CHLORIDE 0.9 % IV SOLN: 250.0000 mL | INTRAVENOUS | Status: DC | PRN

## 2016-07-11 MED ORDER — SODIUM CHLORIDE 0.9 % IV SOLN
INTRAVENOUS | Status: DC
  Administered 2016-07-11: 11:00:00 via INTRAVENOUS

## 2016-07-11 MED ORDER — METHYLPREDNISOLONE SODIUM SUCC 40 MG IJ SOLR
40.0000 mg | Freq: Three times a day (TID) | INTRAMUSCULAR | Status: DC
  Administered 2016-07-11 – 2016-07-12 (×3): 40 mg via INTRAVENOUS
  Filled 2016-07-11 (×3): qty 1

## 2016-07-11 MED ORDER — POLYETHYL GLYCOL-PROPYL GLYCOL 0.4-0.3 % OP SOLN: 1.0000 [drp] | Freq: Two times a day (BID) | OPHTHALMIC | Status: DC | PRN

## 2016-07-11 MED ORDER — ONDANSETRON HCL 4 MG/2ML IJ SOLN: 4.0000 mg | Freq: Four times a day (QID) | INTRAMUSCULAR | Status: DC | PRN

## 2016-07-11 MED ORDER — MOMETASONE FURO-FORMOTEROL FUM 200-5 MCG/ACT IN AERO
2.0000 | INHALATION_SPRAY | Freq: Two times a day (BID) | RESPIRATORY_TRACT | Status: DC
  Administered 2016-07-11 – 2016-07-12 (×2): 2 via RESPIRATORY_TRACT
  Filled 2016-07-11: qty 8.8

## 2016-07-11 MED ORDER — IRBESARTAN 75 MG PO TABS
37.5000 mg | ORAL_TABLET | Freq: Every day | ORAL | Status: DC
  Administered 2016-07-12 – 2016-07-15 (×4): 37.5 mg via ORAL
  Filled 2016-07-11 (×4): qty 0.5

## 2016-07-11 MED ORDER — CALCITONIN (SALMON) 200 UNIT/ACT NA SOLN
1.0000 | Freq: Every day | NASAL | Status: DC
  Administered 2016-07-12 – 2016-07-15 (×4): 1 via NASAL
  Filled 2016-07-11: qty 3.7

## 2016-07-11 MED ORDER — SODIUM CHLORIDE 0.9% FLUSH
3.0000 mL | Freq: Two times a day (BID) | INTRAVENOUS | Status: DC
  Administered 2016-07-12: 3 mL via INTRAVENOUS

## 2016-07-11 MED ORDER — SODIUM CHLORIDE 0.9% FLUSH
3.0000 mL | Freq: Two times a day (BID) | INTRAVENOUS | Status: DC
  Administered 2016-07-11 – 2016-07-14 (×3): 3 mL via INTRAVENOUS

## 2016-07-11 MED ORDER — AZITHROMYCIN 250 MG PO TABS: 500.0000 mg | ORAL_TABLET | ORAL | Status: DC

## 2016-07-11 MED ORDER — METHYLPREDNISOLONE SODIUM SUCC 125 MG IJ SOLR
125.0000 mg | Freq: Once | INTRAMUSCULAR | Status: AC
  Administered 2016-07-11: 125 mg via INTRAVENOUS
  Filled 2016-07-11: qty 2

## 2016-07-11 MED ORDER — ATORVASTATIN CALCIUM 40 MG PO TABS
80.0000 mg | ORAL_TABLET | Freq: Every evening | ORAL | Status: DC
  Administered 2016-07-11 – 2016-07-14 (×4): 80 mg via ORAL
  Filled 2016-07-11 (×4): qty 2

## 2016-07-11 MED ORDER — IPRATROPIUM-ALBUTEROL 0.5-2.5 (3) MG/3ML IN SOLN
3.0000 mL | Freq: Four times a day (QID) | RESPIRATORY_TRACT | Status: DC
  Administered 2016-07-11 – 2016-07-12 (×3): 3 mL via RESPIRATORY_TRACT
  Filled 2016-07-11 (×3): qty 3

## 2016-07-11 MED ORDER — CALCIUM CARBONATE 1500 (600 CA) MG PO TABS
1.0000 | ORAL_TABLET | Freq: Two times a day (BID) | ORAL | Status: DC
  Filled 2016-07-11: qty 1

## 2016-07-11 MED ORDER — SODIUM CHLORIDE 0.9% FLUSH: 3.0000 mL | INTRAVENOUS | Status: DC | PRN

## 2016-07-11 MED ORDER — POLYVINYL ALCOHOL 1.4 % OP SOLN
1.0000 [drp] | Freq: Two times a day (BID) | OPHTHALMIC | Status: DC | PRN
  Filled 2016-07-11: qty 15

## 2016-07-11 MED ORDER — ASPIRIN EC 81 MG PO TBEC
81.0000 mg | DELAYED_RELEASE_TABLET | Freq: Every evening | ORAL | Status: DC
  Administered 2016-07-11 – 2016-07-14 (×4): 81 mg via ORAL
  Filled 2016-07-11 (×4): qty 1

## 2016-07-11 MED ORDER — IPRATROPIUM-ALBUTEROL 0.5-2.5 (3) MG/3ML IN SOLN
3.0000 mL | RESPIRATORY_TRACT | Status: DC | PRN
  Filled 2016-07-11: qty 3

## 2016-07-11 MED ORDER — ALPRAZOLAM 0.25 MG PO TABS
0.2500 mg | ORAL_TABLET | Freq: Every evening | ORAL | Status: DC | PRN
  Administered 2016-07-12 (×2): 0.25 mg via ORAL
  Filled 2016-07-11 (×2): qty 1

## 2016-07-11 NOTE — H&P (Addendum)
History and Physical    [name] [id number] DOB: 1941/09/04 DOA: 07/11/2016  PCP: Gennette Pac, MD   Patient coming from: Home  Chief Complaint: Dyspnea.  HPI: [name] is a 75 y.o. 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 About 5 days ago she was seen by her primary care physician, at that point she had upper back pain and dyspnea, she was diagnosed with a mild COPD exacerbation and she was placed on prednisone. Chest x-ray was performed and was found to have a thoracic compression fracture. Over the course of the next 5 days patient had a brief transitory improvement of her symptoms, then the dyspnea has been worsening to the point where she is dyspneic with minimal efforts. The dyspnea is moderate to severe intensity, has no improvement factors, is worse with ambulation, no associated wheezing, increased mucus production or pleuritic chest pain. She does have  lower extremity edema at the right ankle that she reports to be chronic. No history of VTE in the past. She uses supplemental oxygen at home, 3 L per nasal cannula, recently increased from 2 to 3 in January 2017, she has a limited physical functional capacity, but able to do her routine activities at home, that are limited to flat surface, unable to climb steps.  She is on inhalers but does not use any rescue agents. She follows regularly with the pulmonary clinic.  ED Course: Patient was given oxygen, systemic steroids and IV fluids.  Review of Systems:  1. General no fevers or chills, no weight gain or weight loss 2. Cardiovascular no angina, claudication, no PND or orthopnea 3. Pulmonary positive for dyspnea as mentioned in history present illness 4. Gastrointestinal no nausea vomiting or diarrhea 5. Musculoskeletal for positive for back pain, interscapular 6. Dermatology no rashes 7. Urology no dysuria or  increased urinary frequency 8. Endocrine no tremors heat or cold intolerance 9. Neurology no seizures or per stations 10. ENT no runny nose or sore throat  Past Medical History:  Diagnosis Date  . Blood transfusion without reported diagnosis   . Chronic respiratory failure ([place]) 07/15/2014  . COPD (chronic obstructive pulmonary disease) with emphysema ([place]) 11/05/2012   [name] 2001:  FEV1 0.50, ratio 28 Pulmonary rehab 2013   . Emphysema   . Emphysema of lung ([place])   . Hyperlipidemia   . Hypertension   . Osteoporosis   . Ovarian cancer on left ([place])   . SBO (small bowel obstruction) s/p LOA 11/07/2012 11/04/2012    Past Surgical History:  Procedure Laterality Date  . ABDOMINAL HYSTERECTOMY    . LAPAROSCOPIC LYSIS OF ADHESIONS  11/07/2012   Procedure: LAPAROSCOPIC LYSIS OF ADHESIONS;  Surgeon: Ralene Ok, MD;  Location: [facility];  Service: General;  Laterality: N/A;  . LAPAROSCOPY  11/07/2012   Procedure: LAPAROSCOPY DIAGNOSTIC;  Surgeon: Ralene Ok, MD;  Location: [facility];  Service: General;  Laterality: N/A;  . LAPAROTOMY  11/07/2012   Procedure: EXPLORATORY LAPAROTOMY;  Surgeon: Ralene Ok, MD;  Location: [facility];  Service: General;  Laterality: N/A;  . LYSIS OF ADHESION  11/07/2012   Procedure: LYSIS OF ADHESION;  Surgeon: Ralene Ok, MD;  Location: [facility];  Service: General;;     reports that she quit smoking about 23 years ago. Her smoking use included Cigarettes. She has a 30.00 pack-year smoking history. She has never used smokeless tobacco. She reports that she drinks about 0.6 oz  of alcohol per week . She reports that she does not use drugs.  Allergies  Allergen Reactions  . Oxycodone-Acetaminophen Nausea Only and Other (See Comments)    Headaches     Family History  Problem Relation Age of Onset  . Heart failure Mother   . Heart attack Father   . Heart attack Paternal Grandmother      Prior to Admission medications   Medication Sig Start Date End Date  Taking? Authorizing Provider  albuterol (PROAIR HFA) 108 (90 Base) MCG/ACT inhaler Inhale 2 puffs into the lungs every 6 (six) hours as needed for wheezing or shortness of breath. 05/23/16  Yes Collene Gobble, MD  albuterol (PROVENTIL) (2.5 MG/3ML) 0.083% nebulizer solution Take 3 mLs (2.5 mg total) by nebulization every 6 (six) hours as needed for wheezing or shortness of breath. Dx: J43.8 05/23/16 05/23/17 Yes Collene Gobble, MD  ALPRAZolam Duanne Moron) 0.25 MG tablet Take 1 tablet (0.25 mg total) by mouth at bedtime as needed for sleep. 12/03/14  Yes Christina P Rama, MD  amLODipine (NORVASC) 2.5 MG tablet Take 2.5 mg by mouth every morning. 09/15/15  Yes Historical Provider, MD  aspirin 81 MG tablet Take 81 mg by mouth every evening.    Yes Historical Provider, MD  atorvastatin (LIPITOR) 80 MG tablet Take 80 mg by mouth every evening.    Yes Historical Provider, MD  budesonide-formoterol (SYMBICORT) 160-4.5 MCG/ACT inhaler INHALE 2 PUFFS INTO THE LUNGS 2 (TWO) TIMES DAILY. 05/23/16  Yes Collene Gobble, MD  calcitonin, salmon, (MIACALCIN/FORTICAL) 200 UNIT/ACT nasal spray Place 1 spray into alternate nostrils daily. Right nostril once daily.. For 30 days 07/07/16-08/05/16 then re-evaluate with MD 07/06/16  Yes Historical Provider, MD  Calcium Carbonate (CALTRATE 600) 1500 MG TABS Take 1 tablet by mouth 2 (two) times daily with a meal. Reported on 11/18/2015   Yes Historical Provider, MD  Lutein 20 MG CAPS Take 20 mg by mouth daily.    Yes Historical Provider, MD  metFORMIN (GLUCOPHAGE) 500 MG tablet Take 500 mg by mouth daily with breakfast.   Yes Historical Provider, MD  Multiple Vitamin (MULTIVITAMIN WITH MINERALS) TABS tablet Take 1 tablet by mouth daily.   Yes Historical Provider, MD  olmesartan (BENICAR) 40 MG tablet Take 40 mg by mouth daily.   Yes Historical Provider, MD  Polyethyl Glycol-Propyl Glycol (SYSTANE) 0.4-0.3 % SOLN Apply 1 drop to eye 2 (two) times daily as needed (for dryness).    Yes Historical  Provider, MD  predniSONE (DELTASONE) 10 MG tablet Take 10-40 mg by mouth as directed. Take 40mg  for 3 days, then take 30mg  for 3 days, then take 20mg  for 3 days then stop. 07/06/16  Yes Historical Provider, MD  predniSONE (DELTASONE) 10 MG tablet Take 10 mg by mouth daily with breakfast.   Yes Historical Provider, MD  predniSONE (DELTASONE) 5 MG tablet Take 1 tablet (5 mg total) by mouth daily with breakfast. 05/23/16  Yes Collene Gobble, MD  tiotropium (SPIRIVA) 18 MCG inhalation capsule Place 1 capsule (18 mcg total) into inhaler and inhale daily. 05/23/16  Yes Collene Gobble, MD  azithromycin (ZITHROMAX) 250 MG tablet Take 250-500 mg by mouth as directed. Take 500mg  for day 1 then take 250mg  qd for 4 days 07/06/16-07/10/16 07/06/16   Historical Provider, MD    Physical Exam: Vitals:   07/11/16 1100 07/11/16 1115 07/11/16 1145 07/11/16 1230  BP:      Pulse:  98 96 107  Resp: (!) 30 25  19   Temp:      TempSrc:      SpO2:  100% 100% 98%      Constitutional: Deconditioned and ill looking appearing Vitals:   07/11/16 1100 07/11/16 1115 07/11/16 1145 07/11/16 1230  BP:      Pulse:  98 96 107  Resp: (!) 30 25 19    Temp:      TempSrc:      SpO2:  100% 100% 98%   Eyes: PERRL, lids normal, conjunctiva with mild pallor but no icterus Nose and ears no deformities ENMT: Mucous membranes are dry. Posterior pharynx clear of any exudate or lesions.Normal dentition.  Neck: normal, supple, no masses, no thyromegaly Respiratory:  Very poor ventilation/ air movement, no wheezing, mild by basilar crackles. Moderate increased work of breathing. Positive neck accessory muscle use Cardiovascular: Regular rate and rhythm, tachycardic, no murmurs / rubs / gallops. . 2+ pedal pulses. No carotid bruits. Positive right ankle pitting edema 1+ Abdomen: no tenderness, no masses palpated. No hepatosplenomegaly. Bowel sounds positive.  Musculoskeletal: no clubbing / cyanosis. No joint deformity upper and lower  extremities. Good ROM, no contractures. Normal muscle tone.  Skin: no rashes, lesions, ulcers. No induration Neurologic: CN 2-12 grossly intact. Sensation intact, DTR normal. Strength 5/5 in all 4.    Labs on Admission: I have personally reviewed following labs and imaging studies  CBC:  Recent Labs Lab 07/11/16 1024  WBC 23.4*  HGB 11.0*  HCT 36.4  MCV 101.1*  PLT 123456   Basic Metabolic Panel:  Recent Labs Lab 07/11/16 1024  NA 137  K 4.1  CL 86*  CO2 43*  GLUCOSE 140*  BUN 16  CREATININE 0.54  CALCIUM 10.2   GFR: CrCl cannot be calculated (Unknown ideal weight.). Liver Function Tests: No results for input(s): AST, ALT, ALKPHOS, BILITOT, PROT, ALBUMIN in the last 168 hours. No results for input(s): LIPASE, AMYLASE in the last 168 hours. No results for input(s): AMMONIA in the last 168 hours. Coagulation Profile: No results for input(s): INR, PROTIME in the last 168 hours. Cardiac Enzymes: No results for input(s): CKTOTAL, CKMB, CKMBINDEX, TROPONINI in the last 168 hours. BNP (last 3 results) No results for input(s): PROBNP in the last 8760 hours. HbA1C: No results for input(s): HGBA1C in the last 72 hours. CBG: No results for input(s): GLUCAP in the last 168 hours. Lipid Profile: No results for input(s): CHOL, HDL, LDLCALC, TRIG, CHOLHDL, LDLDIRECT in the last 72 hours. Thyroid Function Tests: No results for input(s): TSH, T4TOTAL, FREET4, T3FREE, THYROIDAB in the last 72 hours. Anemia Panel: No results for input(s): VITAMINB12, FOLATE, FERRITIN, TIBC, IRON, RETICCTPCT in the last 72 hours. Urine analysis:    Component Value Date/Time   COLORURINE YELLOW 08/13/2008 [place] 08/13/2008 1242   LABSPEC 1.012 08/13/2008 1242   PHURINE 6.0 08/13/2008 1242   GLUCOSEU NEGATIVE 08/13/2008 1242   HGBUR NEGATIVE 08/13/2008 1242   BILIRUBINUR NEGATIVE 08/13/2008 1242   KETONESUR NEGATIVE 08/13/2008 1242   PROTEINUR NEGATIVE 08/13/2008 1242    UROBILINOGEN 0.2 08/13/2008 1242   NITRITE NEGATIVE 08/13/2008 1242   LEUKOCYTESUR  08/13/2008 1242    NEGATIVE MICROSCOPIC NOT DONE ON URINES WITH NEGATIVE PROTEIN, BLOOD, LEUKOCYTES, NITRITE, OR GLUCOSE <1000 mg/dL.   Sepsis Labs: !!!!!!!!!!!!!!!!!!!!!!!!!!!!!!!!!!!!!!!!!!!! @LABRCNTIP (procalcitonin:4,lacticidven:4) )No results found for this or any previous visit (from the past 240 hour(s)).   Radiological Exams on Admission: Dg Chest 2 View  Result Date: 07/11/2016 CLINICAL DATA:  Shortness of breath, previous smoker  for 30 years. EXAM: CHEST  2 VIEW COMPARISON:  07/06/2016 FINDINGS: Cardiomediastinal silhouette is stable. Mild hyperinflation again noted. No infiltrate or pleural effusion. No pulmonary edema. Osteopenia and mild degenerative changes thoracic spine. Stable compression deformity mid thoracic spine. IMPRESSION: No active disease.  Hyperinflation again noted. Electronically Signed   By: Lahoma Crocker M.D.   On: 07/11/2016 11:30    EKG: Independently reviewed.Normal sinus rhythm, left axis deviation, heart 104 bpm, normal intervals no ST elevations or ST depressions, no significant T-wave abnormalities.  Chest film. AP film personally reviewed noted significant hyperinflation, no effusions, infiltrates or signs of pneumothorax. Lateral film personally reviewed showing significant kyphosis  Assessment/Plan Active Problems:   COPD exacerbation (HCC)  This is a 75 year old female with significant history for chronic hypoxic respiratory failure and advanced COPD/emphysema. Presents with worsening dyspnea for last 5 days to the point where she is dyspneic with minimal efforts, denies any significant wheezing or increased mucus production. She does have a chronic right lower extremity edema at the level of the ankle. On initial physical examination her blood pressure 131/75, heart rate 103, respiratory rate of 25-30 breaths per minute, temperature 97.9, oxygen saturation 100% on 3 L/ min  per nasal cannula of supplemental oxygen. Her oral mucosa is dry, her conjunctiva is mild pale, she does have accessory muscle use at the level of the neck, significant hypoventilation bilaterally with no significant wheeze or rhonchi. Heart with no S3-S4 gallop, positive asymmetric edema on the right ankle. Sodium 137, potassium 4.1, serum bicarbonate 43, creatinine 0.54, BUN 16, glucose 140, white count 23.4, hemoglobin 11.0, hematocrit 36.4, platelet count 260. Venous blood gas pH 7.35. Her EKG sinus rhythm. Chest film with hyperinflation.  Working diagnosis. COPD exacerbation with impending respiratory failure.  1. COPD exacerbation. Patient will be placed on systemic steroids with methylprednisolone 40 mg IV 3 times daily, continue oxygen monitoring and supplemental oxygen per nasal cannula to target O2 sat duration more than 92%. Will continue inhaled corticosteroids and long-acting beta-2 agonist. Will place patient on short-acting bronchodilator therapy with DuoNeb every 6 hours and as needed every 4 hours. Will continue azithromycin for airway inflammation. Will follow on d-dimer, if positive will proceed with lung VQ scan. Will order ultrasonography of the lower extremities bilaterally. Considering patient's advanced COPD and decreased functional physical capacity, she does have high risk of developing pulmonary embolism and that being the cause of her exacerbation. Will follow with pulmonary recommendations.  2.  Hypertension. Continue antihypertensive agents with amlodipine and irbersartan, systolic blood pressure on admission 130.  3. Dyslipidemia continue atorvastatin.   4. Anxiety. Continue alprazolam  5. Thoracic compression fracture. Continue pain control, need to request records from her primary care physician (she was seen on August 31). Physical therapy evaluation  Patient continued to be at high risk of developing worsening COPD exacerbation and respiratory failure  DVT  prophylaxis: lovenox Code Status: DNR  Family Communication: I spoke with patient's companion at the bedside, with her permission, all questions were addressed. Key information for patient's care was obtained. Disposition Plan: Home Consults called: Pulmonary has been contacted by the ED  Admission status: Inpatient   Draden Cottingham Gerome Apley MD [facility] Pager [phone]  If 7PM-7AM, please contact night-coverage [email or web address] Password [email or web address]  07/11/2016, 12:58 PM

## 2016-07-11 NOTE — Telephone Encounter (Signed)
We will be happy to see her today as planned for further eval. If she has had significant worsening of her dyspnea without any relief then she may need to be treated in the hospital. She doesn't believe she can come to our appointment today due to respiratory instability that I think she needs to be seen in emergency Department and treated urgently.

## 2016-07-11 NOTE — Telephone Encounter (Signed)
Spoke with pt. States that his having a hard time with her breathing. Reports increased SOB, coughing and back pain. Symptoms started last Thursday. Her PCP started her on a prednisone taper. She scheduled an appointment with RB today at 12pm. Pt wanted to know if RB had any recommendations for her before she comes in. While speaking to the pt she did not sound like she was in distress.  RB - please advise. Thanks.

## 2016-07-11 NOTE — Progress Notes (Signed)
Pt arrived to unit around 1400. Discussed VTE. SCD's ordered, pt  Refused to wear. Education provided regarding benefits. Pt has declined.

## 2016-07-11 NOTE — Progress Notes (Signed)
When using bedside commode, pt request staff to not close door to room. Discussed the importance of privacy, pt reports having hx of being claustrophobic.

## 2016-07-11 NOTE — ED Notes (Signed)
Bed: WA15 Expected date:  Expected time:  Means of arrival:  Comments: RES B 

## 2016-07-11 NOTE — Telephone Encounter (Signed)
Spoke with pt. She is aware of RB's recommendation. Nothing further was needed. 

## 2016-07-11 NOTE — Progress Notes (Signed)
Pt request breathing treatment. Completed by primary RN

## 2016-07-11 NOTE — ED Notes (Signed)
Bed: RESB Expected date:  Expected time:  Means of arrival:  Comments: resp distress

## 2016-07-11 NOTE — Telephone Encounter (Signed)
This matter has been taken care of in another telephone encounter. Please see telephone encounter dated 07/07/2016. Message will be closed.

## 2016-07-11 NOTE — Progress Notes (Signed)
PHARMACIST - PHYSICIAN ORDER COMMUNICATION  CONCERNING: P&T Medication Policy on Herbal Medications  DESCRIPTION:  This patient's order for:  Lutein  has been noted.  This product(s) is classified as an "herbal" or natural product. Due to a lack of definitive safety studies or FDA approval, nonstandard manufacturing practices, plus the potential risk of unknown drug-drug interactions while on inpatient medications, the Pharmacy and Therapeutics Committee does not permit the use of "herbal" or natural products of this type within [facility].   ACTION TAKEN: The pharmacy department is unable to verify this order at this time and your patient has been informed of this safety policy. Please reevaluate patient's clinical condition at discharge and address if the herbal or natural product(s) should be resumed at that time.  

## 2016-07-11 NOTE — Telephone Encounter (Signed)
ATC x 3  Line busy wcb

## 2016-07-11 NOTE — Plan of Care (Signed)
Problem: Safety: Goal: Ability to remain free from injury will improve Outcome: Completed/Met Date Met: 07/11/16 Discussed safety plan/prevention. Pt is agreeable

## 2016-07-11 NOTE — ED Triage Notes (Signed)
Pt with hx COPD c/o SOB x 3 days, worse today. PCP increased prednisone dose without improvement. EMS administered 5 mg albuterol, 0.5 mg atrovent. Pt took 5 mg albuterol at home. Wears 3 L/min O2 at home. Labored respirations.

## 2016-07-11 NOTE — ED Provider Notes (Signed)
[facility] DEPT Provider Note   CSN: [id number] Arrival date & time: 07/11/16  1008     History   Chief Complaint Chief Complaint  Patient presents with  . Shortness of Breath    HPI [name] is a 75 y.o. female.  Patient with a history of COPD. Patient states she's been feeling short of breath for 3 days worse today. Felt that she had to increase her oxygen at home. Patient followed by pulmonary medicine. They have been monitoring her COPD and she claims it's been getting worse. Patient normally on 3 L of oxygen at home. But was increased to 4 L patient satting 100%. EMS gave patient 5 mg albuterol and Atrovent in route. Patient also treated her self 5 mg albuterol at home before coming in. Patient states she feels very short of breath particularly when she walks. Patient denies any of recent infections. Did have a chest x-ray a week ago that showed no pneumonia. Patient without any real chest pain but does have some chest discomfort. Patient states she was told based on the x-ray a week ago that there was a compression fracture. The patient doesn't have any particular pain in the thoracic or lumbar part of the back. Patient on a steroid taper. Patient normally is on 15 mg of steroids every day. Currently her taper is at 30 mg. She is felt worse as this is come down.      Past Medical History:  Diagnosis Date  . Blood transfusion without reported diagnosis   . Chronic respiratory failure ([place]) 07/15/2014  . COPD (chronic obstructive pulmonary disease) with emphysema ([place]) 11/05/2012   Arlyce Harman 2001:  FEV1 0.50, ratio 28 Pulmonary rehab 2013   . Emphysema   . Emphysema of lung ([place])   . Hyperlipidemia   . Hypertension   . Osteoporosis   . Ovarian cancer on left ([place])   . SBO (small bowel obstruction) s/p LOA 11/07/2012 11/04/2012    Patient Active Problem List   Diagnosis Date Noted  . COPD exacerbation ([place]) 07/11/2016  . Thrush, oral 12/05/2015  . Allergic rhinitis  12/05/2015  . Protein-calorie malnutrition, severe ([place]) 03/15/2015  . Acute respiratory distress ([facility]) 03/14/2015  . Alcohol abuse   . Activity intolerance 12/02/2014  . Steroid-induced hyperglycemia 12/02/2014  . Chronic respiratory failure ([place]) 07/15/2014  . HTN (hypertension) 10/19/2013  . HLD (hyperlipidemia) 10/19/2013  . Leukocytosis 10/19/2013  . Anemia of chronic disease 10/19/2013  . 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 Anemia 10/30/2012  . Anxiety 10/29/2012    Past Surgical History:  Procedure Laterality Date  . ABDOMINAL HYSTERECTOMY    . LAPAROSCOPIC LYSIS OF ADHESIONS  11/07/2012   Procedure: LAPAROSCOPIC LYSIS OF ADHESIONS;  Surgeon: Ralene Ok, MD;  Location: [facility];  Service: General;  Laterality: N/A;  . LAPAROSCOPY  11/07/2012   Procedure: LAPAROSCOPY DIAGNOSTIC;  Surgeon: Ralene Ok, MD;  Location: [facility];  Service: General;  Laterality: N/A;  . LAPAROTOMY  11/07/2012   Procedure: EXPLORATORY LAPAROTOMY;  Surgeon: Ralene Ok, MD;  Location: [facility];  Service: General;  Laterality: N/A;  . LYSIS OF ADHESION  11/07/2012   Procedure: LYSIS OF ADHESION;  Surgeon: Ralene Ok, MD;  Location: [facility];  Service: General;;    OB History    No data available       Home Medications    Prior to Admission medications   Medication Sig Start Date End Date Taking? Authorizing Provider  albuterol (PROAIR  HFA) 108 (90 Base) MCG/ACT inhaler Inhale 2 puffs into the lungs every 6 (six) hours as needed for wheezing or shortness of breath. 05/23/16  Yes Collene Gobble, MD  albuterol (PROVENTIL) (2.5 MG/3ML) 0.083% nebulizer solution Take 3 mLs (2.5 mg total) by nebulization every 6 (six) hours as needed for wheezing or shortness of breath. Dx: J43.8 05/23/16 05/23/17 Yes Collene Gobble, MD  ALPRAZolam Duanne Moron) 0.25 MG tablet Take 1 tablet (0.25 mg total) by mouth at bedtime as needed for sleep. 12/03/14  Yes Christina P Rama, MD    amLODipine (NORVASC) 2.5 MG tablet Take 2.5 mg by mouth every morning. 09/15/15  Yes Historical Provider, MD  aspirin 81 MG tablet Take 81 mg by mouth every evening.    Yes Historical Provider, MD  atorvastatin (LIPITOR) 80 MG tablet Take 80 mg by mouth every evening.    Yes Historical Provider, MD  budesonide-formoterol (SYMBICORT) 160-4.5 MCG/ACT inhaler INHALE 2 PUFFS INTO THE LUNGS 2 (TWO) TIMES DAILY. 05/23/16  Yes Collene Gobble, MD  calcitonin, salmon, (MIACALCIN/FORTICAL) 200 UNIT/ACT nasal spray Place 1 spray into alternate nostrils daily. Right nostril once daily.. For 30 days 07/07/16-08/05/16 then re-evaluate with MD 07/06/16  Yes Historical Provider, MD  Calcium Carbonate (CALTRATE 600) 1500 MG TABS Take 1 tablet by mouth 2 (two) times daily with a meal. Reported on 11/18/2015   Yes Historical Provider, MD  Lutein 20 MG CAPS Take 20 mg by mouth daily.    Yes Historical Provider, MD  metFORMIN (GLUCOPHAGE) 500 MG tablet Take 500 mg by mouth daily with breakfast.   Yes Historical Provider, MD  Multiple Vitamin (MULTIVITAMIN WITH MINERALS) TABS tablet Take 1 tablet by mouth daily.   Yes Historical Provider, MD  olmesartan (BENICAR) 40 MG tablet Take 40 mg by mouth daily.   Yes Historical Provider, MD  Polyethyl Glycol-Propyl Glycol (SYSTANE) 0.4-0.3 % SOLN Apply 1 drop to eye 2 (two) times daily as needed (for dryness).    Yes Historical Provider, MD  predniSONE (DELTASONE) 10 MG tablet Take 10-40 mg by mouth as directed. Take 40mg  for 3 days, then take 30mg  for 3 days, then take 20mg  for 3 days then stop. 07/06/16  Yes Historical Provider, MD  predniSONE (DELTASONE) 10 MG tablet Take 10 mg by mouth daily with breakfast.   Yes Historical Provider, MD  predniSONE (DELTASONE) 5 MG tablet Take 1 tablet (5 mg total) by mouth daily with breakfast. 05/23/16  Yes Collene Gobble, MD  tiotropium (SPIRIVA) 18 MCG inhalation capsule Place 1 capsule (18 mcg total) into inhaler and inhale daily. 05/23/16  Yes  Collene Gobble, MD  azithromycin (ZITHROMAX) 250 MG tablet Take 250-500 mg by mouth as directed. Take 500mg  for day 1 then take 250mg  qd for 4 days 07/06/16-07/10/16 07/06/16   Historical Provider, MD    Family History Family History  Problem Relation Age of Onset  . Heart failure Mother   . Heart attack Father   . Heart attack Paternal Grandmother     Social History Social History  Substance Use Topics  . Smoking status: Former Smoker    Packs/day: 1.00    Years: 30.00    Types: Cigarettes    Quit date: 11/06/1992  . Smokeless tobacco: Never Used  . Alcohol use 0.6 oz/week    1 Glasses of wine per week     Allergies   Oxycodone-acetaminophen   Review of Systems Review of Systems  Constitutional: Negative for fever.  HENT: Negative for  congestion.   Respiratory: Positive for chest tightness, shortness of breath and wheezing.   Cardiovascular: Negative for chest pain and leg swelling.  Gastrointestinal: Negative for abdominal pain.  Genitourinary: Negative for dysuria.  Musculoskeletal: Negative for back pain.  Skin: Negative for rash.  Neurological: Negative for headaches.  Hematological: Does not bruise/bleed easily.  Psychiatric/Behavioral: Negative for confusion.     Physical Exam Updated Vital Signs BP 136/69 (BP Location: Left Arm)   Pulse 98   Temp 97.9 F (36.6 C) (Oral)   Resp 18   SpO2 98%   Physical Exam  Constitutional: She is oriented to person, place, and time. She appears well-developed and well-nourished. No distress.  HENT:  Head: Normocephalic and atraumatic.  Mouth/Throat: Oropharynx is clear and moist.  Eyes: EOM are normal. Pupils are equal, round, and reactive to light.  Neck: Normal range of motion. Neck supple.  Cardiovascular: Normal rate, regular rhythm and normal heart sounds.   Pulmonary/Chest: Effort normal and breath sounds normal. She has no wheezes.  Abdominal: Soft. Bowel sounds are normal. There is no tenderness.    Musculoskeletal: Normal range of motion. She exhibits no edema.  Neurological: She is alert and oriented to person, place, and time. No cranial nerve deficit. She exhibits normal muscle tone. Coordination normal.  Skin: Skin is warm. No rash noted.  Nursing note and vitals reviewed.    ED Treatments / Results  Labs (all labs ordered are listed, but only abnormal results are displayed) Labs Reviewed  BASIC METABOLIC PANEL - Abnormal; Notable for the following:       Result Value   Chloride 86 (*)    CO2 43 (*)    Glucose, Bld 140 (*)    All other components within normal limits  CBC - Abnormal; Notable for the following:    WBC 23.4 (*)    RBC 3.60 (*)    Hemoglobin 11.0 (*)    MCV 101.1 (*)    All other components within normal limits  BLOOD GAS, VENOUS - Abnormal; Notable for the following:    pCO2, Ven 84.2 (*)    pO2, Ven 55.6 (*)    Bicarbonate 45.7 (*)    Acid-Base Excess 17.0 (*)    All other components within normal limits  D-DIMER, QUANTITATIVE (NOT AT [facility])  Randolm Idol, ED    EKG  EKG Interpretation  Date/Time:  Tuesday July 11 2016 10:23:17 EDT Ventricular Rate:  104 PR Interval:    QRS Duration: 84 QT Interval:  299 QTC Calculation: 403 R Axis:   -20 Text Interpretation:  Sinus tachycardia Borderline left axis deviation Low voltage, extremity and precordial leads ST elevation, consider inferior injury Baseline wander in lead(s) V6 Interpretation limited secondary to artifact No significant change since last tracing Confirmed by Jadd Gasior  MD, Catheryn Slifer [phone]) on 07/11/2016 10:37:57 AM       Radiology Dg Chest 2 View  Result Date: 07/11/2016 CLINICAL DATA:  Shortness of breath, previous smoker for 30 years. EXAM: CHEST  2 VIEW COMPARISON:  07/06/2016 FINDINGS: Cardiomediastinal silhouette is stable. Mild hyperinflation again noted. No infiltrate or pleural effusion. No pulmonary edema. Osteopenia and mild degenerative changes thoracic spine. Stable  compression deformity mid thoracic spine. IMPRESSION: No active disease.  Hyperinflation again noted. Electronically Signed   By: Lahoma Crocker M.D.   On: 07/11/2016 11:30    Procedures Procedures (including critical care time)  Medications Ordered in ED Medications  0.9 %  sodium chloride infusion ( Intravenous New Bag/Given 07/11/16  1115)  methylPREDNISolone sodium succinate (SOLU-MEDROL) 125 mg/2 mL injection 125 mg (125 mg Intravenous Given 07/11/16 1113)     Initial Impression / Assessment and Plan / ED Course  I have reviewed the triage vital signs and the nursing notes.  Pertinent labs & imaging results that were available during my care of the patient were reviewed by me and considered in my medical decision making (see chart for details).  Clinical Course   Patient brought in by EMS for shortness of breath. Patient has a history of COPD. Normally on 3 L of oxygen. Patient is felt that she needed to have that increased of late. Typically when she walks she gets very short of breath. Oxygen saturation on 4 L of oxygen as per arrival was 100%. Patient without any bilateral wheezing. The patient does have pursing of lips and appears to be short of breath. Patient able to talk in complete sentences. Patient is followed by pulmonary medicine Dr. Lamonte Sakai, patient is currently on a steroid taper taking 30 mg of prednisone a day. Patient chronically has been on 15 mg daily.  Chest x-rays negative. Patient received breathing treatment by EMS prior to arrival. No recurrent wheezing. Patient oxygen saturations on 3 L have been in the upper 90s. Patient had the venous blood gas done which showed CO2 elevation but pH was normal. Patient no acute distress but just insists that she still persistently feel short of breath. Discussed with on call pulmonary medicine. They recommend admission hospitalist will admit to telemetry.   Final Clinical Impressions(s) / ED Diagnoses   Final diagnoses:  COPD  exacerbation ([place])    New Prescriptions New Prescriptions   No medications on file     Fredia Sorrow, MD 07/11/16 1330

## 2016-07-12 ENCOUNTER — Inpatient Hospital Stay (HOSPITAL_COMMUNITY): Payer: Medicare Other

## 2016-07-12 DIAGNOSIS — J441 Chronic obstructive pulmonary disease with (acute) exacerbation: Principal | ICD-10-CM

## 2016-07-12 DIAGNOSIS — J9621 Acute and chronic respiratory failure with hypoxia: Secondary | ICD-10-CM

## 2016-07-12 DIAGNOSIS — R609 Edema, unspecified: Secondary | ICD-10-CM

## 2016-07-12 DIAGNOSIS — R0602 Shortness of breath: Secondary | ICD-10-CM

## 2016-07-12 LAB — COMPREHENSIVE METABOLIC PANEL
ALK PHOS: 45 U/L (ref 38–126)
ALT: 20 U/L (ref 14–54)
AST: 16 U/L (ref 15–41)
Albumin: 3.3 g/dL — ABNORMAL LOW (ref 3.5–5.0)
Anion gap: 7 (ref 5–15)
BILIRUBIN TOTAL: 0.8 mg/dL (ref 0.3–1.2)
BUN: 23 mg/dL — AB (ref 6–20)
CALCIUM: 10 mg/dL (ref 8.9–10.3)
CO2: 42 mmol/L — ABNORMAL HIGH (ref 22–32)
CREATININE: 0.63 mg/dL (ref 0.44–1.00)
Chloride: 90 mmol/L — ABNORMAL LOW (ref 101–111)
Glucose, Bld: 130 mg/dL — ABNORMAL HIGH (ref 65–99)
Potassium: 4.2 mmol/L (ref 3.5–5.1)
Sodium: 139 mmol/L (ref 135–145)
TOTAL PROTEIN: 5 g/dL — AB (ref 6.5–8.1)

## 2016-07-12 LAB — CBC
HCT: 31.2 % — ABNORMAL LOW (ref 36.0–46.0)
Hemoglobin: 10 g/dL — ABNORMAL LOW (ref 12.0–15.0)
MCH: 31.2 pg (ref 26.0–34.0)
MCHC: 32.1 g/dL (ref 30.0–36.0)
MCV: 97.2 fL (ref 78.0–100.0)
PLATELETS: 203 10*3/uL (ref 150–400)
RBC: 3.21 MIL/uL — AB (ref 3.87–5.11)
RDW: 13.2 % (ref 11.5–15.5)
WBC: 9 10*3/uL (ref 4.0–10.5)

## 2016-07-12 MED ORDER — IPRATROPIUM-ALBUTEROL 0.5-2.5 (3) MG/3ML IN SOLN
3.0000 mL | Freq: Three times a day (TID) | RESPIRATORY_TRACT | Status: DC
  Administered 2016-07-12 – 2016-07-14 (×8): 3 mL via RESPIRATORY_TRACT
  Filled 2016-07-12 (×8): qty 3

## 2016-07-12 MED ORDER — LEVOFLOXACIN IN D5W 750 MG/150ML IV SOLN
750.0000 mg | INTRAVENOUS | Status: DC
  Administered 2016-07-12 – 2016-07-14 (×2): 750 mg via INTRAVENOUS
  Filled 2016-07-12 (×2): qty 150

## 2016-07-12 MED ORDER — BUDESONIDE-FORMOTEROL FUMARATE 160-4.5 MCG/ACT IN AERO
2.0000 | INHALATION_SPRAY | Freq: Two times a day (BID) | RESPIRATORY_TRACT | Status: DC
  Administered 2016-07-12 – 2016-07-15 (×6): 2 via RESPIRATORY_TRACT
  Filled 2016-07-12: qty 6

## 2016-07-12 MED ORDER — METHYLPREDNISOLONE SODIUM SUCC 40 MG IJ SOLR
40.0000 mg | Freq: Two times a day (BID) | INTRAMUSCULAR | Status: AC
  Administered 2016-07-12 – 2016-07-13 (×2): 40 mg via INTRAVENOUS
  Filled 2016-07-12 (×2): qty 1

## 2016-07-12 NOTE — Progress Notes (Signed)
PROGRESS NOTE    [name]  [id number] DOB: Apr 09, 1941 DOA: 07/11/2016  PCP: Gennette Pac, MD   Brief Narrative:  75 year old female COPD who has been having steady exacerbation of dyspnea over the past week and presented to her PCPs office for this. She was diagnosed with a COPD exacerbation and placed on prednisone but did not have improvement and therefore presented to the ER.  Subjective: He'll that her shortness of breath is improving.  Assessment & Plan:   Active Problems:   COPD exacerbation ([place]) -Continue IV steroids-Zithromax switched to Levaquin and Dulera switched to Symbicort by pulmonary critical care today -Continue O2-at baseline she uses 3 L  Anemia -At baseline- likely anemia of chronic disease   DVT prophylaxis: Lovenox Code Status: DO NOT RESUSCITATE Family Communication:  Disposition Plan: Home in 1-2 days Consultants:   Pulmonary critical care Procedures:    Antimicrobials:  Anti-infectives    Start     Dose/Rate Route Frequency Ordered Stop   07/12/16 1045  levofloxacin (LEVAQUIN) IVPB 750 mg     750 mg 100 mL/hr over 90 Minutes Intravenous Every other day 07/12/16 1020     07/11/16 1552  azithromycin (ZITHROMAX) tablet 500 mg  Status:  Discontinued    Comments:  Take 500mg  for day 1 then take 250mg  qd for 4 days 07/06/16-07/10/16     500 mg Oral As directed 07/11/16 1552 07/11/16 1626       Objective: Vitals:   07/12/16 0812 07/12/16 0815 07/12/16 1325 07/12/16 1411  BP:   (!) 151/68   Pulse:   (!) 101 98  Resp:   (!) 21 20  Temp:   98.3 F (36.8 C)   TempSrc:   Oral   SpO2: 98% 98% 99% 97%  Weight:      Height:        Intake/Output Summary (Last 24 hours) at 07/12/16 1538 Last data filed at 07/12/16 1233  Gross per 24 hour  Intake              300 ml  Output                0 ml  Net              300 ml   Filed Weights   07/11/16 1400  Weight: 36.4 kg (80 lb 3.2 oz)    Examination: General exam: Appears  comfortable  HEENT: PERRLA, oral mucosa moist, no sclera icterus or thrush Respiratory system: Clear to auscultation. Respiratory effort normal. Cardiovascular system: S1 & S2 heard, RRR.  No murmurs  Gastrointestinal system: Abdomen soft, non-tender, nondistended. Normal bowel sound. No organomegaly Central nervous system: Alert and oriented. No focal neurological deficits. Extremities: No cyanosis, clubbing or edema Skin: No rashes or ulcers Psychiatry:  Mood & affect appropriate.     Data Reviewed: I have personally reviewed following labs and imaging studies  CBC:  Recent Labs Lab 07/11/16 1024 07/12/16 0457  WBC 23.4* 9.0  HGB 11.0* 10.0*  HCT 36.4 31.2*  MCV 101.1* 97.2  PLT 260 [id number]   Basic Metabolic Panel:  Recent Labs Lab 07/11/16 1024 07/12/16 0457  NA 137 139  K 4.1 4.2  CL 86* 90*  CO2 43* 42*  GLUCOSE 140* 130*  BUN 16 23*  CREATININE 0.54 0.63  CALCIUM 10.2 10.0   GFR: Estimated Creatinine Clearance: 35.5 mL/min (by C-G formula based on SCr of 0.8 mg/dL). Liver Function Tests:  Recent Labs Lab 07/12/16 0457  AST 16  ALT 20  ALKPHOS 45  BILITOT 0.8  PROT 5.0*  ALBUMIN 3.3*   No results for input(s): LIPASE, AMYLASE in the last 168 hours. No results for input(s): AMMONIA in the last 168 hours. Coagulation Profile: No results for input(s): INR, PROTIME in the last 168 hours. Cardiac Enzymes: No results for input(s): CKTOTAL, CKMB, CKMBINDEX, TROPONINI in the last 168 hours. BNP (last 3 results) No results for input(s): PROBNP in the last 8760 hours. HbA1C: No results for input(s): HGBA1C in the last 72 hours. CBG: No results for input(s): GLUCAP in the last 168 hours. Lipid Profile: No results for input(s): CHOL, HDL, LDLCALC, TRIG, CHOLHDL, LDLDIRECT in the last 72 hours. Thyroid Function Tests: No results for input(s): TSH, T4TOTAL, FREET4, T3FREE, THYROIDAB in the last 72 hours. Anemia Panel: No results for input(s): VITAMINB12,  FOLATE, FERRITIN, TIBC, IRON, RETICCTPCT in the last 72 hours. Urine analysis:    Component Value Date/Time   COLORURINE YELLOW 08/13/2008 [place] 08/13/2008 1242   LABSPEC 1.012 08/13/2008 1242   PHURINE 6.0 08/13/2008 1242   GLUCOSEU NEGATIVE 08/13/2008 1242   HGBUR NEGATIVE 08/13/2008 1242   BILIRUBINUR NEGATIVE 08/13/2008 1242   KETONESUR NEGATIVE 08/13/2008 1242   PROTEINUR NEGATIVE 08/13/2008 1242   UROBILINOGEN 0.2 08/13/2008 1242   NITRITE NEGATIVE 08/13/2008 1242   LEUKOCYTESUR  08/13/2008 1242    NEGATIVE MICROSCOPIC NOT DONE ON URINES WITH NEGATIVE PROTEIN, BLOOD, LEUKOCYTES, NITRITE, OR GLUCOSE <1000 mg/dL.   Sepsis Labs: @LABRCNTIP (procalcitonin:4,lacticidven:4) )No results found for this or any previous visit (from the past 240 hour(s)).       Radiology Studies: Dg Chest 2 View  Result Date: 07/11/2016 CLINICAL DATA:  Shortness of breath, previous smoker for 30 years. EXAM: CHEST  2 VIEW COMPARISON:  07/06/2016 FINDINGS: Cardiomediastinal silhouette is stable. Mild hyperinflation again noted. No infiltrate or pleural effusion. No pulmonary edema. Osteopenia and mild degenerative changes thoracic spine. Stable compression deformity mid thoracic spine. IMPRESSION: No active disease.  Hyperinflation again noted. Electronically Signed   By: Lahoma Crocker M.D.   On: 07/11/2016 11:30      Scheduled Meds: . amLODipine  2.5 mg Oral q morning - 10a  . aspirin EC  81 mg Oral QPM  . atorvastatin  80 mg Oral QPM  . budesonide-formoterol  2 puff Inhalation BID  . calcitonin (salmon)  1 spray Alternating Nares Daily  . calcium carbonate  1 tablet Oral BID WC  . enoxaparin (LOVENOX) injection  30 mg Subcutaneous Q24H  . ipratropium-albuterol  3 mL Nebulization TID  . irbesartan  37.5 mg Oral Daily  . levofloxacin (LEVAQUIN) IV  750 mg Intravenous QODAY  . methylPREDNISolone (SOLU-MEDROL) injection  40 mg Intravenous Q12H  . multivitamin with minerals  1  tablet Oral Daily  . sodium chloride flush  3 mL Intravenous Q12H  . sodium chloride flush  3 mL Intravenous Q12H   Continuous Infusions:    LOS: 1 day    Time spent in minutes: 27    [place], MD [facility] Pager: [email or web address] Password [facility] 07/12/2016, 3:38 PM

## 2016-07-12 NOTE — NC FL2 (Signed)
[place] LEVEL OF CARE SCREENING TOOL     IDENTIFICATION  Patient Name: [name] Birthdate: 01-30-1941 Sex: female Admission Date (Current Location): 07/11/2016  [facility] and [place] Number:  Herbalist and Address:  [facility],  [place] [address], [place]      Provider Number: [id number]  Attending Physician Name and Address:  Debbe Odea, MD  Relative Name and Phone Number:       Current Level of Care: Hospital Recommended Level of Care: [place] Prior Approval Number:    Date Approved/Denied:   PASRR Number: [id number]  Discharge Plan: SNF    Current Diagnoses: Patient Active Problem List   Diagnosis Date Noted  . COPD exacerbation ([place]) 07/11/2016  . Thrush, oral 12/05/2015  . Allergic rhinitis 12/05/2015  . Protein-calorie malnutrition, severe ([place]) 03/15/2015  . Acute respiratory distress ([facility]) 03/14/2015  . Alcohol abuse   . Activity intolerance 12/02/2014  . Steroid-induced hyperglycemia 12/02/2014  . Chronic respiratory failure ([place]) 07/15/2014  . HTN (hypertension) 10/19/2013  . HLD (hyperlipidemia) 10/19/2013  . Leukocytosis 10/19/2013  . Anemia of chronic disease 10/19/2013  . 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 Anemia 10/30/2012  . Anxiety 10/29/2012    Orientation RESPIRATION BLADDER Height & Weight     Self, Time, Situation, Place  O2 (3L) Continent Weight: 80 lb 3.2 oz (36.4 kg) Height:  5\' 1"  (154.9 cm)  BEHAVIORAL SYMPTOMS/MOOD NEUROLOGICAL BOWEL NUTRITION STATUS      Continent Diet (regular)  AMBULATORY STATUS COMMUNICATION OF NEEDS Skin   Extensive Assist Verbally Normal                       Personal Care Assistance Level of Assistance  Bathing, Feeding, Dressing Bathing Assistance: Limited assistance Feeding assistance: Limited assistance Dressing Assistance: Limited assistance     Functional Limitations Info              SPECIAL CARE FACTORS FREQUENCY  PT (By licensed PT), OT (By licensed OT)     PT Frequency: 5 OT Frequency: 5            Contractures      Additional Factors Info  Code Status, Allergies Code Status Info: DNR Allergies Info:  Oxycodone-acetaminophen           Current Medications (07/12/2016):  This is the current hospital active medication list Current Facility-Administered Medications  Medication Dose Route Frequency Provider Last Rate Last Dose  . 0.9 %  sodium chloride infusion  250 mL Intravenous PRN Tawni Millers, MD      . ALPRAZolam Duanne Moron) tablet 0.25 mg  0.25 mg Oral QHS PRN Tawni Millers, MD   0.25 mg at 07/12/16 0014  . amLODipine (NORVASC) tablet 2.5 mg  2.5 mg Oral q morning - 10a Mauricio Gerome Apley, MD   2.5 mg at 07/12/16 0900  . aspirin EC tablet 81 mg  81 mg Oral QPM Mauricio Gerome Apley, MD   81 mg at 07/11/16 1734  . atorvastatin (LIPITOR) tablet 80 mg  80 mg Oral QPM Tawni Millers, MD   80 mg at 07/11/16 1734  . budesonide-formoterol (SYMBICORT) 160-4.5 MCG/ACT inhaler 2 puff  2 puff Inhalation BID Juanito Doom, MD      . calcitonin (salmon) (MIACALCIN/FORTICAL) nasal spray 1 spray  1 spray Alternating Nares Daily Mauricio Gerome Apley, MD   1 spray at 07/12/16 [phone]  .  calcium carbonate (OS-CAL - dosed in mg of elemental calcium) tablet 500 mg of elemental calcium  1 tablet Oral BID WC Tawni Millers, MD   500 mg of elemental calcium at 07/12/16 0900  . enoxaparin (LOVENOX) injection 30 mg  30 mg Subcutaneous Q24H Tawni Millers, MD   30 mg at 07/11/16 1735  . ipratropium-albuterol (DUONEB) 0.5-2.5 (3) MG/3ML nebulizer solution 3 mL  3 mL Nebulization Q4H PRN Mauricio Gerome Apley, MD      . ipratropium-albuterol (DUONEB) 0.5-2.5 (3) MG/3ML nebulizer solution 3 mL  3 mL Nebulization TID Debbe Odea, MD      . irbesartan (AVAPRO) tablet 37.5 mg  37.5 mg Oral Daily Mauricio Gerome Apley, MD    37.5 mg at 07/12/16 0901  . levofloxacin (LEVAQUIN) IVPB 750 mg  750 mg Intravenous QODAY Juanito Doom, MD   750 mg at 07/12/16 1121  . methylPREDNISolone sodium succinate (SOLU-MEDROL) 40 mg/mL injection 40 mg  40 mg Intravenous Q12H Juanito Doom, MD      . multivitamin with minerals tablet 1 tablet  1 tablet Oral Daily Mauricio Gerome Apley, MD   1 tablet at 07/12/16 0900  . ondansetron (ZOFRAN) tablet 4 mg  4 mg Oral Q6H PRN Mauricio Gerome Apley, MD       Or  . ondansetron [facility]) injection 4 mg  4 mg Intravenous Q6H PRN Mauricio Gerome Apley, MD      . polyvinyl alcohol (LIQUIFILM TEARS) 1.4 % ophthalmic solution 1 drop  1 drop Both Eyes BID PRN Tawni Millers, MD      . sodium chloride flush (NS) 0.9 % injection 3 mL  3 mL Intravenous Q12H Mauricio Gerome Apley, MD   3 mL at 07/12/16 1000  . sodium chloride flush (NS) 0.9 % injection 3 mL  3 mL Intravenous Q12H Mauricio Gerome Apley, MD   3 mL at 07/12/16 1000  . sodium chloride flush (NS) 0.9 % injection 3 mL  3 mL Intravenous PRN Mauricio Gerome Apley, MD         Discharge Medications: Please see discharge summary for a list of discharge medications.  Relevant Imaging Results:  Relevant Lab Results:   Additional Information SSN: [id number]  Standley Brooking, LCSW

## 2016-07-12 NOTE — Clinical Social Work Placement (Signed)
   CLINICAL SOCIAL WORK PLACEMENT  NOTE  Date:  07/12/2016  Patient Details  Name: [name] MRN: [id number] Date of Birth: May 12, 1941  Clinical Social Work is seeking post-discharge placement for this patient at the [place] level of care (*CSW will initial, date and re-position this form in  chart as items are completed):  Yes   Patient/family provided with [place] Work Department's list of facilities offering this level of care within the geographic area requested by the patient (or if unable, by the patient's family).  Yes   Patient/family informed of their freedom to choose among providers that offer the needed level of care, that participate in Medicare, Medicaid or managed care program needed by the patient, have an available bed and are willing to accept the patient.  Yes   Patient/family informed of 's ownership interest in [facility] and [facility], as well as of the fact that they are under no obligation to receive care at these facilities.  PASRR submitted to EDS on       PASRR number received on       Existing PASRR number confirmed on 07/12/16     FL2 transmitted to all facilities in geographic area requested by pt/family on 07/12/16     FL2 transmitted to all facilities within larger geographic area on       Patient informed that his/her managed care company has contracts with or will negotiate with certain facilities, including the following:            Patient/family informed of bed offers received.  Patient chooses bed at       Physician recommends and patient chooses bed at      Patient to be transferred to   on  .  Patient to be transferred to facility by       Patient family notified on   of transfer.  Name of family member notified:        PHYSICIAN       Additional Comment:    _______________________________________________ Standley Brooking, LCSW 07/12/2016, 1:04 PM

## 2016-07-12 NOTE — Clinical Social Work Note (Signed)
Clinical Social Work Assessment  Patient Details  Name: [name] MRN: [id number] Date of Birth: April 14, 1941  Date of referral:  07/12/16               Reason for consult:  Facility Placement                Permission sought to share information with:  Chartered certified accountant granted to share information::  Yes, Verbal Permission Granted  Name::        Agency::     Relationship::     Contact Information:     Housing/Transportation Living arrangements for the past 2 months:  Single Family Home Source of Information:  Patient Patient Interpreter Needed:    Criminal Activity/Legal Involvement Pertinent to Current Situation/Hospitalization:  No - Comment as needed Significant Relationships:  Friend, Other Family Members Lives with:  Self Do you feel safe going back to the place where you live?  No Need for family participation in patient care:  No (Coment)  Care giving concerns:  CSW spoke with PT, Santiago Glad re: recommendation - Santiago Glad is recommending SNF at discharge.    Social Worker assessment / plan:  CSW spoke with patient re: discharge plan - patient states that she has been to [facility] in the past but would like to look over the list and review with her friends & family before making a decision. Patient has agreed to have CSW send information out to [facility] SNFs to see which facilities have bed availability - will follow-up tomorrow.   Employment status:  Retired Nurse, adult PT Recommendations:  [place] / Referral to community resources:     Patient/Family's Response to care:  Patient states that she has home care coming out (private duty sitters) for about 5 hours in the mornings and might just have them increase their hours if she does not want to go to SNF.   Patient/Family's Understanding of and Emotional Response to Diagnosis, Current Treatment, and Prognosis:  Patient states that  she feels that it is too early to talk about discharge, CSW explained that it is just for planning so that when she is ready for discharge, she will have a safe discharge plan/place to go.   Emotional Assessment Appearance:  Appears stated age Attitude/Demeanor/Rapport:    Affect (typically observed):    Orientation:  Oriented to Self, Oriented to Place, Oriented to  Time, Oriented to Situation Alcohol / Substance use:    Psych involvement (Current and /or in the community):     Discharge Needs  Concerns to be addressed:    Readmission within the last 30 days:    Current discharge risk:    Barriers to Discharge:      Standley Brooking, LCSW 07/12/2016, 12:59 PM

## 2016-07-12 NOTE — Evaluation (Signed)
Physical Therapy Evaluation Patient Details Name: [name] MRN: [id number] DOB: October 26, 1941 Today's Date: 07/12/2016   History of Present Illness  75 year old female with advanced COPD was admitted on 07/11/2016 with shortness of breath. Mobility is limited  only  to go to the bathroom without dyspnea.   Clinical Impression  The patient was agreeable to mobilize to the [facility], declined to ambulate siting that she was not ready and not able. Patient reports that she has caregiver for a few hours/day and may be able to increase hours but not 24/7. She is amenable to considering SNF.  Pt admitted with above diagnosis. Pt currently with functional limitations due to the deficits listed below (see PT Problem List).  Pt will benefit from skilled PT to increase their independence and safety with mobility to allow discharge to the venue listed below.       Follow Up Recommendations SNF;Supervision/Assistance - 24 hour (unless she arranges increased personal caregivers)    Equipment Recommendations  None recommended by PT    Recommendations for Other Services       Precautions / Restrictions Precautions Precautions: Fall Precaution Comments: on 3 liters      Mobility  Bed Mobility Overal bed mobility: Modified Independent                Transfers Overall transfer level: Needs assistance   Transfers: Sit to/from Stand;Stand Pivot Transfers Sit to Stand: Supervision Stand pivot transfers: Supervision       General transfer comment: patient declined to ambulate to the BR, insisted on BSC.  Ambulation/Gait                Stairs            Wheelchair Mobility    Modified Rankin (Stroke Patients Only)       Balance Overall balance assessment: Needs assistance Sitting-balance support: Feet supported;No upper extremity supported Sitting balance-Leahy Scale: Good     Standing balance support: During functional activity;No upper extremity  supported Standing balance-Leahy Scale: Fair                               Pertinent Vitals/Pain Pain Assessment: No/denies pain    Home Living Family/patient expects to be discharged to:: Private residence Living Arrangements: Alone Available Help at Discharge: Personal care attendant Type of Home:  ([place]home ) Home Access: Stairs to enter   [employer] of Steps: 1 Home Layout: One level Home Equipment: [place] - 2 wheels      Prior Function                 Hand Dominance        Extremity/Trunk Assessment   Upper Extremity Assessment: Generalized weakness           Lower Extremity Assessment: Generalized weakness         Communication      Cognition Arousal/Alertness: Awake/alert Behavior During Therapy: Anxious;Restless Overall Cognitive Status: Within Functional Limits for tasks assessed                      General Comments      Exercises        Assessment/Plan    PT Assessment Patient needs continued PT services  PT Diagnosis Difficulty walking;Generalized weakness   PT Problem List Decreased strength;Decreased activity tolerance;Decreased mobility;Cardiopulmonary status limiting activity  PT Treatment Interventions DME instruction;Gait training;Functional mobility training;Therapeutic activities;Therapeutic exercise  PT Goals (Current goals can be found in the Care Plan section) Acute Rehab PT Goals Patient Stated Goal: to go home PT Goal Formulation: With patient Time For Goal Achievement: 07/26/16 Potential to Achieve Goals: Good    Frequency Min 3X/week   Barriers to discharge Decreased caregiver support      Co-evaluation               End of Session   Activity Tolerance: Treatment limited secondary to medical complications (Comment) (DOE) Patient left: in bed;with call bell/phone within reach;with bed alarm set Nurse Communication: Mobility status         Time: [id number] PT  Time Calculation (min) (ACUTE ONLY): 17 min   Charges:   PT Evaluation $PT Eval Low Complexity: 1 Procedure     PT G CodesClaretha Cooper 07/12/2016, 1:23 PM [name] PT [phone]

## 2016-07-12 NOTE — Consult Note (Signed)
PULMONARY / CRITICAL CARE MEDICINE   Name: [name] MRN: [id number] DOB: [birth year]    ADMISSION DATE:  07/11/2016 CONSULTATION DATE:  07/12/2016  REFERRING MD:  Dr. Wynelle Cleveland with the [facility] hospitalist service  CHIEF COMPLAINT:  Shortness of breath  HISTORY OF PRESENT ILLNESS:   75 year old female with advanced COPD was admitted on 07/11/2016 with shortness of breath. She notes that her dyspnea has been increasing steadily since February 2017 to the point to where it home she's only able to go to the bathroom without dyspnea. Apart from that, she says that she tries to mobilize herself to some degree during 3-4 hour sessions at home but is really unable to do any exercise because of significant dyspnea. She's been using 3 L of oxygen since February 2017.  Approximately 6 days prior to admission she developed increasing chest tightness and shortness of breath. There is no cough no chest pain no fever no chills or leg swelling associated with this. She denies any sick contacts. She saw her primary care physician who recommended a prednisone taper starting at 40 mg daily. Of note, she takes 15 mg prednisone daily.  She has been compliant with her prednisone taper as well as her home inhaled medicine regimen. She takes albuterol nebulized in the morning approximately one hour prior to Spiriva and Symbicort. This dose has not changed recently. However, the last 2-3 days she's been having to use albuterol more frequently in the afternoon and evening because of chest tightness and shortness of breath.  Despite taking the prednisone taper as well as azithromycin prescribed by her primary care physician her shortness of breath persisted. She was supposed to see pulmonary medicine on Tuesday, 07/11/2016 at noon but she called the office feeling quite breathless and it was recommended that she come to the emergency room for admission. Here in the hospital she's been receiving IV steroids as well as  multiple bronchodilators. She does feel a bit better today compared to yesterday. She continues to deny leg pain or swelling.  PAST MEDICAL HISTORY :  She  has a past medical history of Blood transfusion without reported diagnosis; Chronic respiratory failure (HCC) (07/15/2014); COPD (chronic obstructive pulmonary disease) with emphysema ([place]) (11/05/2012); Emphysema; Emphysema of lung ([place]); Hyperlipidemia; Hypertension; Osteoporosis; Ovarian cancer on left [facility]); and SBO (small bowel obstruction) s/p LOA 11/07/2012 (11/04/2012).  PAST SURGICAL HISTORY: She  has a past surgical history that includes Abdominal hysterectomy; laparoscopy (11/07/2012); laparotomy (11/07/2012); Laparoscopic lysis of adhesions (11/07/2012); and Lysis of adhesion (11/07/2012).  Allergies  Allergen Reactions  . Oxycodone-Acetaminophen Nausea Only and Other (See Comments)    Headaches     No current facility-administered medications on file prior to encounter.    Current Outpatient Prescriptions on File Prior to Encounter  Medication Sig  . albuterol (PROAIR HFA) 108 (90 Base) MCG/ACT inhaler Inhale 2 puffs into the lungs every 6 (six) hours as needed for wheezing or shortness of breath.  Marland Kitchen albuterol (PROVENTIL) (2.5 MG/3ML) 0.083% nebulizer solution Take 3 mLs (2.5 mg total) by nebulization every 6 (six) hours as needed for wheezing or shortness of breath. Dx: J43.8  . ALPRAZolam (XANAX) 0.25 MG tablet Take 1 tablet (0.25 mg total) by mouth at bedtime as needed for sleep.  Marland Kitchen amLODipine (NORVASC) 2.5 MG tablet Take 2.5 mg by mouth every morning.  Marland Kitchen aspirin 81 MG tablet Take 81 mg by mouth every evening.   Marland Kitchen atorvastatin (LIPITOR) 80 MG tablet Take 80 mg by mouth every evening.   Marland Kitchen  budesonide-formoterol (SYMBICORT) 160-4.5 MCG/ACT inhaler INHALE 2 PUFFS INTO THE LUNGS 2 (TWO) TIMES DAILY.  . Calcium Carbonate (CALTRATE 600) 1500 MG TABS Take 1 tablet by mouth 2 (two) times daily with a meal. Reported on 11/18/2015  . Lutein 20  MG CAPS Take 20 mg by mouth daily.   . metFORMIN (GLUCOPHAGE) 500 MG tablet Take 500 mg by mouth daily with breakfast.  . Multiple Vitamin (MULTIVITAMIN WITH MINERALS) TABS tablet Take 1 tablet by mouth daily.  Marland Kitchen olmesartan (BENICAR) 40 MG tablet Take 40 mg by mouth daily.  Vladimir Faster Glycol-Propyl Glycol (SYSTANE) 0.4-0.3 % SOLN Apply 1 drop to eye 2 (two) times daily as needed (for dryness).   . predniSONE (DELTASONE) 5 MG tablet Take 1 tablet (5 mg total) by mouth daily with breakfast.  . tiotropium (SPIRIVA) 18 MCG inhalation capsule Place 1 capsule (18 mcg total) into inhaler and inhale daily.    FAMILY HISTORY:  Her indicated that the status of her mother is unknown. She indicated that the status of her father is unknown. She indicated that the status of her paternal grandmother is unknown.    SOCIAL HISTORY: She  reports that she quit smoking about 23 years ago. Her smoking use included Cigarettes. She has a 30.00 pack-year smoking history. She has never used smokeless tobacco. She reports that she drinks about 0.6 oz of alcohol per week . She reports that she does not use drugs.  REVIEW OF SYSTEMS:   Gen: Denies fever, chills, weight change, + fatigue, night sweats HEENT: Denies blurred vision, double vision, hearing loss, tinnitus, sinus congestion, rhinorrhea, sore throat, neck stiffness, dysphagia PULM: per HPI CV: Denies chest pain, edema, orthopnea, paroxysmal nocturnal dyspnea, palpitations GI: Denies abdominal pain, nausea, vomiting, diarrhea, hematochezia, melena, constipation, change in bowel habits GU: Denies dysuria, hematuria, polyuria, oliguria, urethral discharge Endocrine: Denies hot or cold intolerance, polyuria, polyphagia or appetite change Derm: Denies rash, dry skin, scaling or peeling skin change Heme: Denies easy bruising, bleeding, bleeding gums Neuro: Denies headache, numbness, weakness, slurred speech, loss of memory or consciousness   SUBJECTIVE:  As  above  VITAL SIGNS: BP 126/67 (BP Location: Left Arm)   Pulse 78   Temp 98.3 F (36.8 C) (Oral)   Resp 18   Ht 5\' 1"  (1.549 m)   Wt 36.4 kg (80 lb 3.2 oz)   SpO2 98%   BMI 15.15 kg/m   HEMODYNAMICS:    VENTILATOR SETTINGS:    INTAKE / OUTPUT: I/O last 3 completed shifts: In: 180 [P.O.:180] Out: -   PHYSICAL EXAMINATION: General:  Chronically ill appearing but comfortable in bed Neuro:  Awake, alert, moves all 4 extremities HEENT:  Normocephalic atraumatic, oropharynx clear Cardiovascular:  Regular rate and rhythm, no murmurs gallops or rubs Lungs:  Very poor air movement, no crackles or wheezes, barrel-shaped chest Abdomen:  Bowel sounds positive, nontender nondistended Musculoskeletal:  Diminished bulk, no bony deformities Skin:  Scattered bruising throughout legs bilaterally on extensor surfaces  LABS:  BMET  Recent Labs Lab 07/11/16 1024 07/12/16 0457  NA 137 139  K 4.1 4.2  CL 86* 90*  CO2 43* 42*  BUN 16 23*  CREATININE 0.54 0.63  GLUCOSE 140* 130*    Electrolytes  Recent Labs Lab 07/11/16 1024 07/12/16 0457  CALCIUM 10.2 10.0    CBC  Recent Labs Lab 07/11/16 1024 07/12/16 0457  WBC 23.4* 9.0  HGB 11.0* 10.0*  HCT 36.4 31.2*  PLT 260 203    Coag's No  results for input(s): APTT, INR in the last 168 hours.  Sepsis Markers No results for input(s): LATICACIDVEN, PROCALCITON, O2SATVEN in the last 168 hours.  ABG No results for input(s): PHART, PCO2ART, PO2ART in the last 168 hours.  Liver Enzymes  Recent Labs Lab 07/12/16 0457  AST 16  ALT 20  ALKPHOS 45  BILITOT 0.8  ALBUMIN 3.3*    Cardiac Enzymes No results for input(s): TROPONINI, PROBNP in the last 168 hours.  Glucose No results for input(s): GLUCAP in the last 168 hours.  Imaging Dg Chest 2 View  Result Date: 07/11/2016 CLINICAL DATA:  Shortness of breath, previous smoker for 30 years. EXAM: CHEST  2 VIEW COMPARISON:  07/06/2016 FINDINGS: Cardiomediastinal  silhouette is stable. Mild hyperinflation again noted. No infiltrate or pleural effusion. No pulmonary edema. Osteopenia and mild degenerative changes thoracic spine. Stable compression deformity mid thoracic spine. IMPRESSION: No active disease.  Hyperinflation again noted. Electronically Signed   By: Lahoma Crocker M.D.   On: 07/11/2016 11:30     STUDIES:  07/12/2016 lower extremity Doppler ultrasound bilateral prelim report negative for DVT 07/11/2016 chest x-ray images personally reviewed showing severe emphysema with hyperinflation but no infiltrate or mass.  CULTURES:   ANTIBIOTICS: 9/6 2017 Levaquin  SIGNIFICANT EVENTS:   LINES/TUBES:   9/5 12 lead EKG> poor baseline, normal sinus rhythm, no clear ST-T wave changes on my review  DISCUSSION: 75 y/o female with advanced COPD (FEV1 2013 0.5L, on home O2 baseline and 15mg  prednisone baseline) admitted on 9/5 with acute on chronic respiratory failure with hypoxemia most likely due to a flare of her advanced COPD.    ASSESSMENT / PLAN:  PULMONARY A: Acute on chronic respiratory failure with hypoxemia COPD exacerbation  P:   Would add Levaquin given recurrent exacerbations, frequent antibiotic exposure Decreased dose of Solu-Medrol every 12 hours Change Dulera to Symbicort Continue Atrovent Titrate O2 to maintain O2 saturation 90-94%  Will follow   Roselie Awkward, MD [place] [facility] Pager: [phone] Cell: [phone] After 3pm or if no response, call [phone]  07/12/2016, 10:20 AM

## 2016-07-12 NOTE — Progress Notes (Signed)
VASCULAR LAB PRELIMINARY  PRELIMINARY  PRELIMINARY  PRELIMINARY  Bilateral lower extremity venous duplex completed.    Preliminary report:  Bilateral:  No evidence of DVT, superficial thrombosis, or Baker's Cyst.   Zohaib Heeney, RVS 07/12/2016, 10:20 AM

## 2016-07-12 NOTE — Progress Notes (Signed)
PT Cancellation Note  Patient Details Name: [name] MRN: [id number] DOB: Apr 04, 1941   Cancelled Treatment:    Reason Eval/Treat Not Completed: Patient at procedure or test/unavailable (MD and in test at bedside. will check back later . )   [name] 07/12/2016, 10:14 AM [name] PT [phone]

## 2016-07-13 LAB — GLUCOSE, CAPILLARY
Glucose-Capillary: 126 mg/dL — ABNORMAL HIGH (ref 65–99)
Glucose-Capillary: 225 mg/dL — ABNORMAL HIGH (ref 65–99)
Glucose-Capillary: 227 mg/dL — ABNORMAL HIGH (ref 65–99)
Glucose-Capillary: 80 mg/dL (ref 65–99)

## 2016-07-13 MED ORDER — SALINE SPRAY 0.65 % NA SOLN
1.0000 | NASAL | Status: DC | PRN
  Filled 2016-07-13: qty 44

## 2016-07-13 MED ORDER — ENSURE ENLIVE PO LIQD
237.0000 mL | Freq: Three times a day (TID) | ORAL | Status: DC
  Administered 2016-07-13 – 2016-07-15 (×4): 237 mL via ORAL

## 2016-07-13 MED ORDER — POLYETHYLENE GLYCOL 3350 17 G PO PACK
17.0000 g | PACK | Freq: Every day | ORAL | Status: DC | PRN
  Administered 2016-07-13 – 2016-07-14 (×2): 17 g via ORAL
  Filled 2016-07-13 (×2): qty 1

## 2016-07-13 MED ORDER — INSULIN ASPART 100 UNIT/ML ~~LOC~~ SOLN
0.0000 [IU] | Freq: Three times a day (TID) | SUBCUTANEOUS | Status: DC
  Administered 2016-07-13: 3 [IU] via SUBCUTANEOUS
  Administered 2016-07-13: 1 [IU] via SUBCUTANEOUS
  Administered 2016-07-14: 2 [IU] via SUBCUTANEOUS

## 2016-07-13 MED ORDER — INSULIN ASPART 100 UNIT/ML ~~LOC~~ SOLN: 0.0000 [IU] | Freq: Every day | SUBCUTANEOUS | Status: DC

## 2016-07-13 MED ORDER — NYSTATIN 100000 UNIT/ML MT SUSP
5.0000 mL | Freq: Four times a day (QID) | OROMUCOSAL | Status: DC
  Administered 2016-07-13 – 2016-07-15 (×9): 500000 [IU] via OROMUCOSAL
  Filled 2016-07-13 (×9): qty 5

## 2016-07-13 MED ORDER — LORATADINE 10 MG PO TABS
10.0000 mg | ORAL_TABLET | Freq: Every day | ORAL | Status: DC
  Administered 2016-07-13 – 2016-07-15 (×3): 10 mg via ORAL
  Filled 2016-07-13 (×3): qty 1

## 2016-07-13 MED ORDER — ALPRAZOLAM 0.25 MG PO TABS
0.2500 mg | ORAL_TABLET | Freq: Every day | ORAL | Status: DC
Start: 2016-07-13 — End: 2016-07-15
  Administered 2016-07-13 – 2016-07-14 (×2): 0.25 mg via ORAL
  Filled 2016-07-13 (×2): qty 1

## 2016-07-13 MED ORDER — ALPRAZOLAM 0.5 MG PO TABS: 0.5000 mg | ORAL_TABLET | Freq: Three times a day (TID) | ORAL | Status: DC | PRN

## 2016-07-13 NOTE — Progress Notes (Signed)
PULMONARY / CRITICAL CARE MEDICINE   Name: [name] MRN: [id number] DOB: 05/13/1941    ADMISSION DATE:  07/11/2016 CONSULTATION DATE:  07/12/2016  REFERRING MD:  Dr. Wynelle Cleveland with the [facility] hospitalist service  CHIEF COMPLAINT:  Shortness of breath  BRIEF: 75 y/o female with acute on chronic respiratory failure with hypoxemia due to COPD excerbation   SUBJECTIVE:  Feels maybe worse today Sinus congestion  VITAL SIGNS: BP (!) 146/74 (BP Location: Left Arm)   Pulse 85   Temp 98.7 F (37.1 C) (Oral)   Resp 20   Ht 5\' 1"  (1.549 m)   Wt 36.4 kg (80 lb 3.2 oz)   SpO2 100%   BMI 15.15 kg/m   HEMODYNAMICS:    VENTILATOR SETTINGS:    INTAKE / OUTPUT: I/O last 3 completed shifts: In: 330 [P.O.:180; IV Piggyback:150] Out: -   PHYSICAL EXAMINATION: General:  Chronically ill appearing but comfortable in bed Neuro:  Awake, alert, moves all 4 extremities HEENT:  Normocephalic atraumatic, oropharynx clear Cardiovascular:  Regular rate and rhythm, no murmurs gallops or rubs Lungs:  Very poor air movement, no crackles or wheezes, barrel-shaped chest Abdomen:  Bowel sounds positive, nontender nondistended Musculoskeletal:  Diminished bulk, no bony deformities Skin:  Scattered bruising throughout legs bilaterally on extensor surfaces  LABS:  BMET  Recent Labs Lab 07/11/16 1024 07/12/16 0457  NA 137 139  K 4.1 4.2  CL 86* 90*  CO2 43* 42*  BUN 16 23*  CREATININE 0.54 0.63  GLUCOSE 140* 130*    Electrolytes  Recent Labs Lab 07/11/16 1024 07/12/16 0457  CALCIUM 10.2 10.0    CBC  Recent Labs Lab 07/11/16 1024 07/12/16 0457  WBC 23.4* 9.0  HGB 11.0* 10.0*  HCT 36.4 31.2*  PLT 260 203    Coag's No results for input(s): APTT, INR in the last 168 hours.  Sepsis Markers No results for input(s): LATICACIDVEN, PROCALCITON, O2SATVEN in the last 168 hours.  ABG No results for input(s): PHART, PCO2ART, PO2ART in the last 168 hours.  Liver  Enzymes  Recent Labs Lab 07/12/16 0457  AST 16  ALT 20  ALKPHOS 45  BILITOT 0.8  ALBUMIN 3.3*    Cardiac Enzymes No results for input(s): TROPONINI, PROBNP in the last 168 hours.  Glucose No results for input(s): GLUCAP in the last 168 hours.  Imaging No results found.   STUDIES:  07/12/2016 lower extremity Doppler ultrasound bilateral prelim report negative for DVT 07/11/2016 chest x-ray images personally reviewed showing severe emphysema with hyperinflation but no infiltrate or mass.  CULTURES:   ANTIBIOTICS: 9/6 2017 Levaquin  SIGNIFICANT EVENTS:   LINES/TUBES:   9/5 12 lead EKG> poor baseline, normal sinus rhythm, no clear ST-T wave changes on my review  DISCUSSION: 75 y/o female with advanced COPD (FEV1 2013 0.5L, on home O2 baseline and 15mg  prednisone baseline) admitted on 9/5 with acute on chronic respiratory failure with hypoxemia most likely due to a flare of her advanced COPD.  Not much progress 9/7.  ASSESSMENT / PLAN:  PULMONARY A: Acute on chronic respiratory failure with hypoxemia COPD exacerbation Rhinitis > allergic? P:   Continue levaquin, could transition to PO post 9/8 IV dose Continue Solu-Medrol every 12 hours Continue Symbicort Continue Atrovent-Albuterol Titrate O2 to maintain O2 saturation 90-94%, Add humidifier today Add claritin  Will follow   Roselie Awkward, MD [place] [facility] Pager: [phone] Cell: [phone] After 3pm or if no response, call [phone]  07/13/2016, 12:09 PM

## 2016-07-13 NOTE — Care Management Note (Signed)
Case Management Note  Patient Details  Name: [name] MRN: [id number] Date of Birth: 01-26-1941  Subjective/Objective:                  COPD exacerbation with chronic CO2 retention  Action/Plan: Discharge planning Expected Discharge Date:   (unknown)               Expected Discharge Plan:  [place]  In-House Referral:     Discharge planning Services  CM Consult  Post Acute Care Choice:    Choice offered to:  Patient  DME Arranged:  N/A DME Agency:  NA  HH Arranged:  NA HH Agency:  NA  Status of Service:  Completed, signed off  If discussed at [place] of Stay Meetings, dates discussed:    Additional Comments: CM met with pt and her friend Opal Sidles; pt expressly asked for Opal Sidles to stay to discuss disposition.  Pt has chosen to go to SNF; CSW aware and arranging.  No other CM needs were communicated. Dellie Catholic, RN 07/13/2016, 2:58 PM

## 2016-07-13 NOTE — Progress Notes (Addendum)
PROGRESS NOTE    [name]  [id number] DOB: 14-Nov-1940 DOA: 07/11/2016  PCP: Gennette Pac, MD   Brief Narrative:  75 year old female COPD who has been having steady exacerbation of dyspnea over the past week and presented to her PCPs office for this. She was diagnosed with a COPD exacerbation and placed on prednisone but did not have improvement and therefore presented to the ER.  Subjective: Dyspnea continues to improves- has post nasal drip today- does not want try a nasal steriod  Assessment & Plan:   Active Problems:   COPD exacerbation with chronic CO2 retention  -Continue IV steroids-Zithromax switched to Levaquin and Dulera switched to Symbicort by pulmonary critical care today -Continue O2-at baseline she uses 3 L - leukocytosis improved- 23.0 >> 9.0  Anemia -At baseline- likely anemia of chronic disease  DM2 - hold Metformin- cont ISS  Pedal edema - venous duplex negative for DVT  Thoracic compression fracture - no focal pain   DVT prophylaxis: Lovenox Code Status: DO NOT RESUSCITATE Family Communication:  Disposition Plan: SNF in 1-2 days Consultants:   Pulmonary critical care Procedures:    Antimicrobials:  Anti-infectives    Start     Dose/Rate Route Frequency Ordered Stop   07/12/16 1045  levofloxacin (LEVAQUIN) IVPB 750 mg     750 mg 100 mL/hr over 90 Minutes Intravenous Every other day 07/12/16 1020     07/11/16 1552  azithromycin (ZITHROMAX) tablet 500 mg  Status:  Discontinued    Comments:  Take 500mg  for day 1 then take 250mg  qd for 4 days 07/06/16-07/10/16     500 mg Oral As directed 07/11/16 1552 07/11/16 1626       Objective: Vitals:   07/12/16 1943 07/12/16 2017 07/13/16 0613 07/13/16 1003  BP:  137/70 (!) 146/74   Pulse:  87 79 85  Resp:  20 18 20   Temp:  98.3 F (36.8 C) 98.7 F (37.1 C)   TempSrc:  Oral Oral   SpO2: 98% 100% 100% 100%  Weight:      Height:       No intake or output data in the 24 hours ending  07/13/16 1324 Filed Weights   07/11/16 1400  Weight: 36.4 kg (80 lb 3.2 oz)    Examination: General exam: Appears comfortable  HEENT: PERRLA, oral mucosa moist, no sclera icterus or thrush Respiratory system: Clear to auscultation. Respiratory effort normal. Cardiovascular system: S1 & S2 heard, RRR.  No murmurs  Gastrointestinal system: Abdomen soft, non-tender, nondistended. Normal bowel sound. No organomegaly Central nervous system: Alert and oriented. No focal neurological deficits. Extremities: No cyanosis, clubbing or edema Skin: No rashes or ulcers Psychiatry:  Mood & affect appropriate.     Data Reviewed: I have personally reviewed following labs and imaging studies  CBC:  Recent Labs Lab 07/11/16 1024 07/12/16 0457  WBC 23.4* 9.0  HGB 11.0* 10.0*  HCT 36.4 31.2*  MCV 101.1* 97.2  PLT 260 [id number]   Basic Metabolic Panel:  Recent Labs Lab 07/11/16 1024 07/12/16 0457  NA 137 139  K 4.1 4.2  CL 86* 90*  CO2 43* 42*  GLUCOSE 140* 130*  BUN 16 23*  CREATININE 0.54 0.63  CALCIUM 10.2 10.0   GFR: Estimated Creatinine Clearance: 35.5 mL/min (by C-G formula based on SCr of 0.8 mg/dL). Liver Function Tests:  Recent Labs Lab 07/12/16 0457  AST 16  ALT 20  ALKPHOS 45  BILITOT 0.8  PROT 5.0*  ALBUMIN 3.3*   No  results for input(s): LIPASE, AMYLASE in the last 168 hours. No results for input(s): AMMONIA in the last 168 hours. Coagulation Profile: No results for input(s): INR, PROTIME in the last 168 hours. Cardiac Enzymes: No results for input(s): CKTOTAL, CKMB, CKMBINDEX, TROPONINI in the last 168 hours. BNP (last 3 results) No results for input(s): PROBNP in the last 8760 hours. HbA1C: No results for input(s): HGBA1C in the last 72 hours. CBG:  Recent Labs Lab 07/13/16 1229  GLUCAP 126*   Lipid Profile: No results for input(s): CHOL, HDL, LDLCALC, TRIG, CHOLHDL, LDLDIRECT in the last 72 hours. Thyroid Function Tests: No results for input(s):  TSH, T4TOTAL, FREET4, T3FREE, THYROIDAB in the last 72 hours. Anemia Panel: No results for input(s): VITAMINB12, FOLATE, FERRITIN, TIBC, IRON, RETICCTPCT in the last 72 hours. Urine analysis:    Component Value Date/Time   COLORURINE YELLOW 08/13/2008 [place] 08/13/2008 1242   LABSPEC 1.012 08/13/2008 1242   PHURINE 6.0 08/13/2008 1242   GLUCOSEU NEGATIVE 08/13/2008 1242   HGBUR NEGATIVE 08/13/2008 1242   BILIRUBINUR NEGATIVE 08/13/2008 1242   KETONESUR NEGATIVE 08/13/2008 1242   PROTEINUR NEGATIVE 08/13/2008 1242   UROBILINOGEN 0.2 08/13/2008 1242   NITRITE NEGATIVE 08/13/2008 1242   LEUKOCYTESUR  08/13/2008 1242    NEGATIVE MICROSCOPIC NOT DONE ON URINES WITH NEGATIVE PROTEIN, BLOOD, LEUKOCYTES, NITRITE, OR GLUCOSE <1000 mg/dL.   Sepsis Labs: @LABRCNTIP (procalcitonin:4,lacticidven:4) )No results found for this or any previous visit (from the past 240 hour(s)).       Radiology Studies: No results found.    Scheduled Meds: . ALPRAZolam  0.25 mg Oral QHS  . amLODipine  2.5 mg Oral q morning - 10a  . aspirin EC  81 mg Oral QPM  . atorvastatin  80 mg Oral QPM  . budesonide-formoterol  2 puff Inhalation BID  . calcitonin (salmon)  1 spray Alternating Nares Daily  . calcium carbonate  1 tablet Oral BID WC  . enoxaparin (LOVENOX) injection  30 mg Subcutaneous Q24H  . feeding supplement (ENSURE ENLIVE)  237 mL Oral TID BM  . insulin aspart  0-5 Units Subcutaneous QHS  . insulin aspart  0-9 Units Subcutaneous TID WC  . ipratropium-albuterol  3 mL Nebulization TID  . irbesartan  37.5 mg Oral Daily  . levofloxacin (LEVAQUIN) IV  750 mg Intravenous QODAY  . loratadine  10 mg Oral Daily  . multivitamin with minerals  1 tablet Oral Daily  . nystatin  5 mL Mouth/Throat QID  . sodium chloride flush  3 mL Intravenous Q12H  . sodium chloride flush  3 mL Intravenous Q12H   Continuous Infusions:    LOS: 2 days    Time spent in minutes: [place],  MD [facility] Pager: [email or web address] Password [email or web address] 07/13/2016, 1:24 PM

## 2016-07-13 NOTE — Clinical Social Work Placement (Signed)
CSW provided patient with SNF bed offers - per patient, she is trying to get a friend to go and tour [facility] and [facility] before making a decision.    Raynaldo Opitz, [facility] Clinical Social Worker cell #: [phone]     CLINICAL SOCIAL WORK PLACEMENT  NOTE  Date:  07/13/2016  Patient Details  Name: [name] MRN: [id number] Date of Birth: December 19, 1940  Clinical Social Work is seeking post-discharge placement for this patient at the [place] level of care (*CSW will initial, date and re-position this form in  chart as items are completed):  Yes   Patient/family provided with [place] Work Department's list of facilities offering this level of care within the geographic area requested by the patient (or if unable, by the patient's family).  Yes   Patient/family informed of their freedom to choose among providers that offer the needed level of care, that participate in Medicare, Medicaid or managed care program needed by the patient, have an available bed and are willing to accept the patient.  Yes   Patient/family informed of [place]'s ownership interest in [facility] and [facility], as well as of the fact that they are under no obligation to receive care at these facilities.  PASRR submitted to EDS on       PASRR number received on       Existing PASRR number confirmed on 07/12/16     FL2 transmitted to all facilities in geographic area requested by pt/family on 07/12/16     FL2 transmitted to all facilities within larger geographic area on       Patient informed that his/her managed care company has contracts with or will negotiate with certain facilities, including the following:        Yes   Patient/family informed of bed offers received.  Patient chooses bed at       Physician recommends and patient chooses bed at      Patient to be transferred to   on  .  Patient to be transferred  to facility by       Patient family notified on   of transfer.  Name of family member notified:        PHYSICIAN       Additional Comment:    _______________________________________________ Standley Brooking, LCSW 07/13/2016, 2:05 PM

## 2016-07-13 NOTE — Progress Notes (Signed)
Initial Nutrition Assessment  DOCUMENTATION CODES:   Severe malnutrition in context of chronic illness, Underweight  INTERVENTION:   Provide Ensure Enlive po BID, each supplement provides 350 kcal and 20 grams of protein Encourage PO intake, small frequent meals RD to continue to monitor for needs  NUTRITION DIAGNOSIS:   Increased nutrient needs related to wound healing (COPD) as evidenced by estimated needs.  GOAL:   Patient will meet greater than or equal to 90% of their needs  MONITOR:   PO intake, Supplement acceptance, Labs, Weight trends, Skin, I & O's  REASON FOR ASSESSMENT:   Other (Comment) (Low BMI)   ASSESSMENT:   75 year old female COPD who has been having steady exacerbation of dyspnea over the past week and presented to her PCPs office for this. She was diagnosed with a COPD exacerbation and placed on prednisone but did not have improvement and therefore presented to the ER.  Patient with sitter at bedside. Pt very hesitant to speak with RD initially d/t concerns about payment of nutrition assessment. "Did someone call you to see me? Not to be rude, but I will have to pay for this visit". Explained that maintaining adequate nutrition status is vital with COPD and maintaining weight. Pt understands but still seems concerned about finances. Pt reports drinking Ensures during this visit but does not want RD ordering Boost products as "these come from the Pharmacy and the hospital charges a lot more for Boost". Pt drinks Boost High Protein at home. She states this has less sugar than the Ensure Enlive. She is concerned the Ensure will cause her diabetes to "get out of control". Explained to patient that RD will monitor blood sugar levels and if they increase will adjust supplements.  Will continue Ensure supplements as pt is underweight, with sacral wound and continues to have progressive weight loss over the past year. Nutrition-Focused physical exam completed. Findings  are severe fat depletion, severe muscle depletion, and no edema.   Labs reviewed. Medications: OS-CAL tablet BID, Multivitamin with minerals daily  Diet Order:  Diet regular Room service appropriate? Yes; Fluid consistency: Thin  Skin:  Wound (see comment) (Stage 1 sacral wound)  Last BM:  9/4  Height:   Ht Readings from Last 1 Encounters:  07/11/16 5\' 1"  (1.549 m)    Weight:   Wt Readings from Last 1 Encounters:  07/11/16 80 lb 3.2 oz (36.4 kg)    Ideal Body Weight:  47.7 kg  BMI:  Body mass index is 15.15 kg/m.  Estimated Nutritional Needs:   Kcal:  1100-1300  Protein:  55-65g  Fluid:  1.3L/day  EDUCATION NEEDS:   Education needs addressed  Clayton Bibles, MS, RD, LDN Pager: [phone] After Hours Pager: [phone]

## 2016-07-14 DIAGNOSIS — J9611 Chronic respiratory failure with hypoxia: Secondary | ICD-10-CM

## 2016-07-14 DIAGNOSIS — E43 Unspecified severe protein-calorie malnutrition: Secondary | ICD-10-CM

## 2016-07-14 DIAGNOSIS — E119 Type 2 diabetes mellitus without complications: Secondary | ICD-10-CM

## 2016-07-14 DIAGNOSIS — R6889 Other general symptoms and signs: Secondary | ICD-10-CM

## 2016-07-14 DIAGNOSIS — D638 Anemia in other chronic diseases classified elsewhere: Secondary | ICD-10-CM

## 2016-07-14 LAB — GLUCOSE, CAPILLARY
Glucose-Capillary: 92 mg/dL (ref 65–99)
Glucose-Capillary: 89 mg/dL (ref 65–99)
Glucose-Capillary: 183 mg/dL — ABNORMAL HIGH (ref 65–99)
Glucose-Capillary: 83 mg/dL (ref 65–99)

## 2016-07-14 MED ORDER — PREDNISONE 20 MG PO TABS: 20.0000 mg | ORAL_TABLET | Freq: Every day | ORAL | Status: DC

## 2016-07-14 MED ORDER — PREDNISONE 10 MG PO TABS: ORAL_TABLET | ORAL | Status: DC

## 2016-07-14 MED ORDER — LORATADINE 10 MG PO TABS: 10.0000 mg | ORAL_TABLET | Freq: Every day | ORAL | 0 refills | Status: AC

## 2016-07-14 MED ORDER — IPRATROPIUM-ALBUTEROL 0.5-2.5 (3) MG/3ML IN SOLN
3.0000 mL | Freq: Two times a day (BID) | RESPIRATORY_TRACT | Status: DC
  Administered 2016-07-15: 3 mL via RESPIRATORY_TRACT
  Filled 2016-07-14: qty 3

## 2016-07-14 MED ORDER — PREDNISONE 5 MG PO TABS: 15.0000 mg | ORAL_TABLET | Freq: Every day | ORAL | Status: DC

## 2016-07-14 MED ORDER — PREDNISONE 5 MG PO TABS
30.0000 mg | ORAL_TABLET | Freq: Every day | ORAL | Status: DC
Start: 2016-07-15 — End: 2016-07-15
  Administered 2016-07-15: 30 mg via ORAL
  Filled 2016-07-14: qty 2

## 2016-07-14 MED ORDER — LEVOFLOXACIN 750 MG PO TABS: 750.0000 mg | ORAL_TABLET | ORAL | Status: DC

## 2016-07-14 MED ORDER — ENSURE ENLIVE PO LIQD: 237.0000 mL | Freq: Three times a day (TID) | ORAL | 12 refills | Status: DC

## 2016-07-14 MED ORDER — LEVOFLOXACIN 750 MG PO TABS: 750.0000 mg | ORAL_TABLET | ORAL | 0 refills | Status: DC

## 2016-07-14 NOTE — Clinical Social Work Placement (Signed)
CSW confirmed with Rollene Fare at [facility] that they would be able to take patient over the weekend, anticipating discharge Saturday 9/9.   Please call weekend CSW (ph#: [phone]) to facilitate discharge.      Raynaldo Opitz, [facility] Clinical Social Worker cell #: [phone]   CLINICAL SOCIAL WORK PLACEMENT  NOTE  Date:  07/14/2016  Patient Details  Name: [name] MRN: [id number] Date of Birth: January 05, 1941  Clinical Social Work is seeking post-discharge placement for this patient at the [place] level of care (*CSW will initial, date and re-position this form in  chart as items are completed):  Yes   Patient/family provided with [place] Work Department's list of facilities offering this level of care within the geographic area requested by the patient (or if unable, by the patient's family).  Yes   Patient/family informed of their freedom to choose among providers that offer the needed level of care, that participate in Medicare, Medicaid or managed care program needed by the patient, have an available bed and are willing to accept the patient.  Yes   Patient/family informed of [facility]'s ownership interest in [facility] and [facility], as well as of the fact that they are under no obligation to receive care at these facilities.  PASRR submitted to EDS on       PASRR number received on       Existing PASRR number confirmed on 07/12/16     FL2 transmitted to all facilities in geographic area requested by pt/family on 07/12/16     FL2 transmitted to all facilities within larger geographic area on       Patient informed that his/her managed care company has contracts with or will negotiate with certain facilities, including the following:        Yes   Patient/family informed of bed offers received.  Patient chooses bed at [facility]     Physician recommends and patient chooses bed at       Patient to be transferred to [facility] on  .  Patient to be transferred to facility by       Patient family notified on   of transfer.  Name of family member notified:        PHYSICIAN       Additional Comment:    _______________________________________________ Standley Brooking, LCSW 07/14/2016, 3:40 PM

## 2016-07-14 NOTE — Progress Notes (Signed)
Physical Therapy Treatment Patient Details Name: [name] MRN: [id number] DOB: [birth year] Today's Date: 07/14/2016    History of Present Illness 75 year old female with advanced COPD was admitted on 07/11/2016 with shortness of breath. Mobility is limited  only  to go to the bathroom without dyspnea.     PT Comments    Pt progressing with mobility however is still quite limited in activity tolerance due to SOB. She became fatigued after walking 4', SaO2 98% on 3L O2. Performed seated and supine BUE/LE exercises, pt able to tolerate 3-5 repetitions of each before becoming SOB. Titrated O2 down to 2L per pt request, SaO2 98% on 2L at rest.    Follow Up Recommendations  SNF;Supervision/Assistance - 24 hour     Equipment Recommendations  None recommended by PT    Recommendations for Other Services       Precautions / Restrictions Precautions Precautions: Fall Precaution Comments: on 3 liters recently, had been on 2L Restrictions Weight Bearing Restrictions: No    Mobility  Bed Mobility Overal bed mobility: Modified Independent                Transfers Overall transfer level: Needs assistance Equipment used: None Transfers: Sit to/from Stand Sit to Stand: Min guard         General transfer comment: min/guard for safety, no physical assist  Ambulation/Gait Ambulation/Gait assistance: Min guard;Min assist Ambulation Distance (Feet): 4 Feet Assistive device: None Gait Pattern/deviations: Step-to pattern;Decreased step length - left;Decreased stance time - right;Narrow base of support   Gait velocity interpretation: Below normal speed for age/gender General Gait Details: 2' forward, 2' backward, distance limited by SOB, SaO2 98% on 3L O2 [place] with ambulation, min A for minor LOB x 1, pt refused use of RW   Stairs            Wheelchair Mobility    Modified Rankin (Stroke Patients Only)       Balance     Sitting balance-Leahy Scale: Good       Standing balance-Leahy Scale: Fair                      Cognition Arousal/Alertness: Awake/alert Behavior During Therapy: WFL for tasks assessed/performed Overall Cognitive Status: Within Functional Limits for tasks assessed                      Exercises General Exercises - Upper Extremity Shoulder Flexion: AROM;Both;Other reps (comment);Seated (x 3 reps) General Exercises - Lower Extremity Ankle Circles/Pumps: AROM;Both;5 reps;Supine Quad Sets: AROM;Both;Other reps (comment);Supine (x 3 reps) Long Arc Quad: AROM;Both;5 reps;Seated    General Comments        Pertinent Vitals/Pain Pain Assessment: No/denies pain    Home Living                      Prior Function            PT Goals (current goals can now be found in the care plan section) Acute Rehab PT Goals Patient Stated Goal: to go home PT Goal Formulation: With patient Time For Goal Achievement: 07/26/16 Potential to Achieve Goals: Good Progress towards PT goals: Progressing toward goals    Frequency  Min 3X/week    PT Plan Current plan remains appropriate    Co-evaluation             End of Session Equipment Utilized During Treatment: Gait belt Activity Tolerance: Patient limited by fatigue Patient  left: in bed;with call bell/phone within reach;with family/visitor present     Time: 1022-1047 PT Time Calculation (min) (ACUTE ONLY): 25 min  Charges:  $Therapeutic Exercise: 8-22 mins $Therapeutic Activity: 8-22 mins                    G Codes:      [name] 07/14/2016, 10:55 AM [phone]

## 2016-07-14 NOTE — Discharge Summary (Addendum)
Physician Discharge Summary  [name] [id number] DOB: 1941/01/12 DOA: 07/11/2016  PCP: Gennette Pac, MD  Admit date: 07/11/2016 Discharge date: 07/15/2016  Admitted From: home  Disposition:  SNF   Recommendations for Outpatient Follow-up:  1. Recommend referral to palliative care 2.  Will go to SNF 3. Will need to stop Levaquin after last dose on Sunday.  Discharge Condition:  stable   CODE STATUS:  DNR   Diet recommendation:  Regular diet Consultations:  PCCM    Discharge Diagnoses:  Principal Problem:   COPD exacerbation ([place]) Active Problems:   Anemia of chronic disease   Chronic respiratory failure (HCC)   Activity intolerance   Protein-calorie malnutrition, severe (HCC)   DM (diabetes mellitus), type 2 (HCC)    Subjective: Short of breath again today when trying to ambulate.   Brief Summary: 75 year old female COPD who has been having steady exacerbation of dyspnea over the past week and presented to her PCPs office for this. She was diagnosed with a COPD exacerbation and placed on prednisone but did not have improvement and therefore presented to the ER.  Hospital Course:  Active Problems:   COPD exacerbation with chronic CO2 retention - started on Zithromax and IV steroids on admission - Zithromax switched to Levaquin and Dulera switched to Symbicort by pulmonary critical care   -Continue O2-at baseline she uses 3 L - leukocytosis improved- 23.0 >> 9.0 - pulmonary recommendations> begin "[facility] transitional hospice from home",  cont Levaquin for total 5 days and transition to a Prednisone taper: 30mg  daily for 3 days, 20mg  daily for 3 days - will then drop down to 10 mg daily   Severe deconditioning - barely able to walk 10 ft from her bed due to complaint of dyspnea despite having normal O2 saturations - will go to SNF for rehab  Anemia -At baseline- likely anemia of chronic disease  DM2 - holding Metformin in the hospital- can resume on  discharge  Pedal edema - venous duplex negative for DVT  Thoracic compression fracture - no focal pain  Discharge Instructions  Discharge Instructions    Diet - low sodium heart healthy    Complete by:  As directed   Increase activity slowly    Complete by:  As directed       Medication List    STOP taking these medications   azithromycin 250 MG tablet Commonly known as:  ZITHROMAX     TAKE these medications   albuterol 108 (90 Base) MCG/ACT inhaler Commonly known as:  PROAIR HFA Inhale 2 puffs into the lungs every 6 (six) hours as needed for wheezing or shortness of breath.   albuterol (2.5 MG/3ML) 0.083% nebulizer solution Commonly known as:  PROVENTIL Take 3 mLs (2.5 mg total) by nebulization every 6 (six) hours as needed for wheezing or shortness of breath. Dx: J43.8   ALPRAZolam 0.25 MG tablet Commonly known as:  XANAX Take 1 tablet (0.25 mg total) by mouth at bedtime as needed for sleep.   amLODipine 2.5 MG tablet Commonly known as:  NORVASC Take 2.5 mg by mouth every morning.   aspirin 81 MG tablet Take 81 mg by mouth every evening.   atorvastatin 80 MG tablet Commonly known as:  LIPITOR Take 80 mg by mouth every evening.   budesonide-formoterol 160-4.5 MCG/ACT inhaler Commonly known as:  SYMBICORT INHALE 2 PUFFS INTO THE LUNGS 2 (TWO) TIMES DAILY.   calcitonin (salmon) 200 UNIT/ACT nasal spray Commonly known as:  MIACALCIN/FORTICAL Place 1 spray  into alternate nostrils daily. Right nostril once daily.. For 30 days 07/07/16-08/05/16 then re-evaluate with MD   CALTRATE 600 1500 (600 Ca) MG Tabs tablet Generic drug:  calcium carbonate Take 1 tablet by mouth 2 (two) times daily with a meal. Reported on 11/18/2015   feeding supplement (ENSURE ENLIVE) Liqd Take 237 mLs by mouth 3 (three) times daily between meals.   levofloxacin 750 MG tablet Commonly known as:  LEVAQUIN Take 1 tablet (750 mg total) by mouth every other day. Start taking on:   07/16/2016   loratadine 10 MG tablet Commonly known as:  CLARITIN Take 1 tablet (10 mg total) by mouth daily.   Lutein 20 MG Caps Take 20 mg by mouth daily.   metFORMIN 500 MG tablet Commonly known as:  GLUCOPHAGE Take 500 mg by mouth daily with breakfast.   multivitamin with minerals Tabs tablet Take 1 tablet by mouth daily.   olmesartan 40 MG tablet Commonly known as:  BENICAR Take 40 mg by mouth daily.   predniSONE 10 MG tablet Commonly known as:  DELTASONE 30 mg daily x 3 days, 20 mg daily x 3 days, 10 mg daily x 3 days What changed:  medication strength  how much to take  how to take this  when to take this  additional instructions  Another medication with the same name was removed. Continue taking this medication, and follow the directions you see here.   ranitidine 150 MG tablet Commonly known as:  ZANTAC Take 1 tablet (150 mg total) by mouth 2 (two) times daily.   SYSTANE 0.4-0.3 % Soln Generic drug:  Polyethyl Glycol-Propyl Glycol Apply 1 drop to eye 2 (two) times daily as needed (for dryness).   tiotropium 18 MCG inhalation capsule Commonly known as:  SPIRIVA Place 1 capsule (18 mcg total) into inhaler and inhale daily.      Contact information for after-discharge care    Destination    [facility] .   Specialty:  Skilled Nursing Facility Contact information: [place] [address] [phone]             Allergies  Allergen Reactions  . Oxycodone-Acetaminophen Nausea Only and Other (See Comments)    Headaches      Procedures/Studies:   Dg Chest 2 View  Result Date: 07/11/2016 CLINICAL DATA:  Shortness of breath, previous smoker for 30 years. EXAM: CHEST  2 VIEW COMPARISON:  07/06/2016 FINDINGS: Cardiomediastinal silhouette is stable. Mild hyperinflation again noted. No infiltrate or pleural effusion. No pulmonary edema. Osteopenia and mild degenerative changes thoracic spine. Stable compression  deformity mid thoracic spine. IMPRESSION: No active disease.  Hyperinflation again noted. Electronically Signed   By: Lahoma Crocker M.D.   On: 07/11/2016 11:30       Discharge Exam: Vitals:   07/14/16 2034 07/15/16 0503  BP: 140/69 (!) 124/56  Pulse: 90 81  Resp: 20   Temp: 98.3 F (36.8 C) 97.8 F (36.6 C)   Vitals:   07/14/16 1910 07/14/16 2034 07/15/16 0503 07/15/16 0930  BP:  140/69 (!) 124/56   Pulse:  90 81   Resp:  20    Temp:  98.3 F (36.8 C) 97.8 F (36.6 C)   TempSrc:  Oral Oral   SpO2: 98% 99% 94% 98%  Weight:      Height:        General: Pt is alert, awake, not in acute distress Cardiovascular: RRR, S1/S2 +, no rubs, no gallops Respiratory: CTA bilaterally,  no wheezing, no rhonchi Abdominal: Soft, NT, ND, bowel sounds + Extremities: no edema, no cyanosis    The results of significant diagnostics from this hospitalization (including imaging, microbiology, ancillary and laboratory) are listed below for reference.     Microbiology: No results found for this or any previous visit (from the past 240 hour(s)).   Labs: BNP (last 3 results) No results for input(s): BNP in the last 8760 hours. Basic Metabolic Panel:  Recent Labs Lab 07/11/16 1024 07/12/16 0457  NA 137 139  K 4.1 4.2  CL 86* 90*  CO2 43* 42*  GLUCOSE 140* 130*  BUN 16 23*  CREATININE 0.54 0.63  CALCIUM 10.2 10.0   Liver Function Tests:  Recent Labs Lab 07/12/16 0457  AST 16  ALT 20  ALKPHOS 45  BILITOT 0.8  PROT 5.0*  ALBUMIN 3.3*   No results for input(s): LIPASE, AMYLASE in the last 168 hours. No results for input(s): AMMONIA in the last 168 hours. CBC:  Recent Labs Lab 07/11/16 1024 07/12/16 0457  WBC 23.4* 9.0  HGB 11.0* 10.0*  HCT 36.4 31.2*  MCV 101.1* 97.2  PLT 260 203   Cardiac Enzymes: No results for input(s): CKTOTAL, CKMB, CKMBINDEX, TROPONINI in the last 168 hours. BNP: Invalid input(s): POCBNP CBG:  Recent Labs Lab 07/14/16 0802 07/14/16 1249  07/14/16 1641 07/14/16 2032 07/15/16 0727  GLUCAP 92 89 183* 83 104*   D-Dimer No results for input(s): DDIMER in the last 72 hours. Hgb A1c No results for input(s): HGBA1C in the last 72 hours. Lipid Profile No results for input(s): CHOL, HDL, LDLCALC, TRIG, CHOLHDL, LDLDIRECT in the last 72 hours. Thyroid function studies No results for input(s): TSH, T4TOTAL, T3FREE, THYROIDAB in the last 72 hours.  Invalid input(s): FREET3 Anemia work up No results for input(s): VITAMINB12, FOLATE, FERRITIN, TIBC, IRON, RETICCTPCT in the last 72 hours. Urinalysis    Component Value Date/Time   COLORURINE YELLOW 08/13/2008 1242   APPEARANCEUR CLEAR 08/13/2008 1242   LABSPEC 1.012 08/13/2008 1242   PHURINE 6.0 08/13/2008 1242   GLUCOSEU NEGATIVE 08/13/2008 1242   HGBUR NEGATIVE 08/13/2008 1242   BILIRUBINUR NEGATIVE 08/13/2008 1242   KETONESUR NEGATIVE 08/13/2008 1242   PROTEINUR NEGATIVE 08/13/2008 1242   UROBILINOGEN 0.2 08/13/2008 1242   NITRITE NEGATIVE 08/13/2008 1242   LEUKOCYTESUR  08/13/2008 1242    NEGATIVE MICROSCOPIC NOT DONE ON URINES WITH NEGATIVE PROTEIN, BLOOD, LEUKOCYTES, NITRITE, OR GLUCOSE <1000 mg/dL.   Sepsis Labs Invalid input(s): PROCALCITONIN,  WBC,  LACTICIDVEN Microbiology No results found for this or any previous visit (from the past 240 hour(s)).   Time coordinating discharge: Over 30 minutes  SIGNED:   Debbe Odea, MD  [facility] 07/15/2016, 10:09 AM Pager   If 7PM-7AM, please contact night-coverage [email or web address] Password [email or web address]

## 2016-07-14 NOTE — Progress Notes (Addendum)
PULMONARY / CRITICAL CARE MEDICINE   Name: [name] MRN: [id number] DOB: 18-Jun-1941    ADMISSION DATE:  07/11/2016 CONSULTATION DATE:  07/12/2016  REFERRING MD:  Dr. Wynelle Cleveland with the [facility]  CHIEF COMPLAINT:  Shortness of breath  BRIEF: 75 y/o female with acute on chronic respiratory failure with hypoxemia due to COPD excerbation   SUBJECTIVE:  Feels better  VITAL SIGNS: BP 134/75 (BP Location: Right Arm)   Pulse 75   Temp 97.8 F (36.6 C) (Oral)   Resp 18   Ht 5\' 1"  (1.549 m)   Wt 80 lb 3.2 oz (36.4 kg)   SpO2 98%   BMI 15.15 kg/m   HEMODYNAMICS:    VENTILATOR SETTINGS:    INTAKE / OUTPUT: I/O last 3 completed shifts: In: -  Out: 400 [Urine:400]  PHYSICAL EXAMINATION: General:  Chronically ill appearing but comfortable in bed Neuro:  Awake, alert, moves all 4 extremities HEENT:  Normocephalic atraumatic, oropharynx clear Cardiovascular:  Regular rate and rhythm, no murmurs gallops or rubs Lungs:  Poor air movement but slightly improved, no crackles or wheezes, barrel-shaped chest Abdomen:  Bowel sounds positive, nontender nondistended Musculoskeletal:  Diminished bulk, no bony deformities Skin:  Scattered bruising throughout legs bilaterally on extensor surfaces  LABS:  BMET  Recent Labs Lab 07/11/16 1024 07/12/16 0457  NA 137 139  K 4.1 4.2  CL 86* 90*  CO2 43* 42*  BUN 16 23*  CREATININE 0.54 0.63  GLUCOSE 140* 130*    Electrolytes  Recent Labs Lab 07/11/16 1024 07/12/16 0457  CALCIUM 10.2 10.0    CBC  Recent Labs Lab 07/11/16 1024 07/12/16 0457  WBC 23.4* 9.0  HGB 11.0* 10.0*  HCT 36.4 31.2*  PLT 260 203    Coag's No results for input(s): APTT, INR in the last 168 hours.  Sepsis Markers No results for input(s): LATICACIDVEN, PROCALCITON, O2SATVEN in the last 168 hours.  ABG No results for input(s): PHART, PCO2ART, PO2ART in the last 168 hours.  Liver Enzymes  Recent Labs Lab 07/12/16 0457   AST 16  ALT 20  ALKPHOS 45  BILITOT 0.8  ALBUMIN 3.3*    Cardiac Enzymes No results for input(s): TROPONINI, PROBNP in the last 168 hours.  Glucose  Recent Labs Lab 07/13/16 1229 07/13/16 1648 07/13/16 1653 07/13/16 2149 07/14/16 0802 07/14/16 1249  GLUCAP 126* 227* 225* 80 92 89    Imaging No results found.   STUDIES:  07/12/2016 lower extremity Doppler ultrasound bilateral prelim report negative for DVT 07/11/2016 chest x-ray images personally reviewed showing severe emphysema with hyperinflation but no infiltrate or mass.  CULTURES:   ANTIBIOTICS: 9/6 2017 Levaquin  SIGNIFICANT EVENTS:   LINES/TUBES:   9/5 12 lead EKG> poor baseline, normal sinus rhythm, no clear ST-T wave changes on my review  DISCUSSION: 75 y/o female with advanced COPD (FEV1 2013 0.5L, on home O2 baseline and 15mg  prednisone baseline) admitted on 9/5 with acute on chronic respiratory failure with hypoxemia most likely due to a flare of her advanced COPD.    ASSESSMENT / PLAN:  PULMONARY A: Acute on chronic respiratory failure with hypoxemia COPD exacerbation Rhinitis > allergic? P:   Continue levaquin change to po and give for 5 total days Continue Solu-Medrol every 12 hours > transition to prednisone today 30mg  daily for 3 days, 20mg  daily for 3 days then back to baseline 15mg  daily Continue Symbicort Continue Atrovent-Albuterol Titrate O2 to maintain O2 saturation 90-94%, Add humidifier today Continue claritin  I advised today that she should go to rehab.    She would be an outstanding candidate for transitional hospice through [facility].  Will cc her PCP to notify him.  I think the best plan is > d/c today to SNF, transition to [facility] transitional hospice for home based care.  Discussed with social work.   Roselie Awkward, MD [place] [facility] Pager: [phone] Cell: [phone] After 3pm or if no response, call [phone]  07/14/2016, 1:07 PM

## 2016-07-15 LAB — GLUCOSE, CAPILLARY
GLUCOSE-CAPILLARY: 104 mg/dL — AB (ref 65–99)
GLUCOSE-CAPILLARY: 110 mg/dL — AB (ref 65–99)

## 2016-07-15 MED ORDER — RANITIDINE HCL 150 MG PO TABS: 150.0000 mg | ORAL_TABLET | Freq: Two times a day (BID) | ORAL | Status: AC

## 2016-07-15 MED ORDER — ALPRAZOLAM 0.25 MG PO TABS: 0.2500 mg | ORAL_TABLET | Freq: Every evening | ORAL | 0 refills | Status: DC | PRN

## 2016-07-15 NOTE — Progress Notes (Signed)
[facility]  I have examined the patient and discussed the plan. We have gone over her discharge medications today. She is stable for d/c to SNF- please see my d/c summary from 07/14/16.   Debbe Odea, MD

## 2016-07-15 NOTE — Care Management Important Message (Signed)
Important Message  Patient Details  Name: [name] MRN: [id number] Date of Birth: 07/03/1941   Medicare Important Message Given:  Yes    Erenest Rasher, RN 07/15/2016, 12:03 PM

## 2016-07-15 NOTE — Clinical Social Work Placement (Signed)
Medical Social Worker facilitated patient discharge including contacting patient family and facility to confirm patient discharge plans.  Clinical information faxed to facility and family agreeable with plan.  MSW arranged ambulance transport via [place] [facility] [place] .  RN to call report prior to discharge [phone].  Medical Social Worker will sign off for now as social work intervention is no longer needed. Please consult [place] again if new need arises.   CLINICAL SOCIAL WORK PLACEMENT  NOTE  Date:  07/15/2016  Patient Details  Name: [name] MRN: [id number] Date of Birth: 1940-12-07  Clinical Social Work is seeking post-discharge placement for this patient at the [place] level of care (*CSW will initial, date and re-position this form in  chart as items are completed):  Yes   Patient/family provided with [place] Work Department's list of facilities offering this level of care within the geographic area requested by the patient (or if unable, by the patient's family).  Yes   Patient/family informed of their freedom to choose among providers that offer the needed level of care, that participate in Medicare, Medicaid or managed care program needed by the patient, have an available bed and are willing to accept the patient.  Yes   Patient/family informed of [place]'s ownership interest in [facility] and [facility], as well as of the fact that they are under no obligation to receive care at these facilities.  PASRR submitted to EDS on       PASRR number received on       Existing PASRR number confirmed on 07/12/16     FL2 transmitted to all facilities in geographic area requested by pt/family on 07/12/16     FL2 transmitted to all facilities within larger geographic area on       Patient informed that his/her managed care company has contracts with or will negotiate with certain facilities, including the  following:        Yes   Patient/family informed of bed offers received.  Patient chooses bed at [facility] [place] )     Physician recommends and patient chooses bed at      Patient to be transferred to [facility] ) on 07/15/16.  Patient to be transferred to facility by  [facility] )     Patient family notified on 07/15/16 of transfer.  Name of family member notified:   (Pt's friend, Opal Sidles )     PHYSICIAN Please prepare prescriptions     Additional Comment:    _______________________________________________ Glendon Axe A 07/15/2016, 12:19 PM

## 2016-07-15 NOTE — Care Management Note (Signed)
Case Management Note  Patient Details  Name: [name] MRN: [id number] Date of Birth: Feb 04, 1941  Subjective/Objective:    COPD                Action/Plan: Discharge Planning: AVS reviewed: Chart reviewed. Scheduled dc to SNF. CSW following for SNF placement.    Expected Discharge Date:  07/15/2016           Expected Discharge Plan:  Skilled Nursing Facility  In-House Referral:  Clinical Social Work  Discharge planning Services  CM Consult  Post Acute Care Choice:  NA Choice offered to:  Patient  DME Arranged:  N/A DME Agency:  NA  HH Arranged:  NA HH Agency:  NA  Status of Service:  Completed, signed off  If discussed at [employer] of Stay Meetings, dates discussed:    Additional Comments:  Erenest Rasher, RN 07/15/2016, 12:04 PM

## 2016-07-15 NOTE — Progress Notes (Signed)
Attempted to call [name] to give report to receiving nurse. After 10 minutes on hold and being transferred to another area, no answer.

## 2016-07-17 ENCOUNTER — Encounter: Payer: Self-pay | Admitting: Adult Health

## 2016-07-17 ENCOUNTER — Other Ambulatory Visit: Payer: Self-pay | Admitting: *Deleted

## 2016-07-17 ENCOUNTER — Non-Acute Institutional Stay (SKILLED_NURSING_FACILITY): Payer: Medicare Other | Admitting: Adult Health

## 2016-07-17 DIAGNOSIS — E785 Hyperlipidemia, unspecified: Secondary | ICD-10-CM | POA: Diagnosis not present

## 2016-07-17 DIAGNOSIS — E118 Type 2 diabetes mellitus with unspecified complications: Secondary | ICD-10-CM | POA: Diagnosis not present

## 2016-07-17 DIAGNOSIS — R5381 Other malaise: Secondary | ICD-10-CM | POA: Diagnosis not present

## 2016-07-17 DIAGNOSIS — F419 Anxiety disorder, unspecified: Secondary | ICD-10-CM | POA: Diagnosis not present

## 2016-07-17 DIAGNOSIS — J441 Chronic obstructive pulmonary disease with (acute) exacerbation: Secondary | ICD-10-CM

## 2016-07-17 DIAGNOSIS — E46 Unspecified protein-calorie malnutrition: Secondary | ICD-10-CM | POA: Diagnosis not present

## 2016-07-17 DIAGNOSIS — K219 Gastro-esophageal reflux disease without esophagitis: Secondary | ICD-10-CM | POA: Diagnosis not present

## 2016-07-17 DIAGNOSIS — D638 Anemia in other chronic diseases classified elsewhere: Secondary | ICD-10-CM | POA: Diagnosis not present

## 2016-07-17 DIAGNOSIS — I10 Essential (primary) hypertension: Secondary | ICD-10-CM

## 2016-07-17 MED ORDER — ALPRAZOLAM 0.25 MG PO TABS: 0.2500 mg | ORAL_TABLET | Freq: Every evening | ORAL | 0 refills | Status: AC | PRN

## 2016-07-17 NOTE — Progress Notes (Signed)
Patient ID: [name], female   DOB: Apr 24, 1941, 75 y.o.   MRN: [id number]    DATE:  07/17/2016   MRN:  [id number]  BIRTHDAY: 1940-12-03  Facility:  [facility] Location:  [place] Room Number: 1107-P  LEVEL OF CARE:  SNF (31)  Contact Information    Name Relation Home Work Mobile   Pennock Friend   [phone]   [name] [phone]  [phone]   Tacey Heap   [id number]       Code Status History    Date Active Date Inactive Code Status Order ID Comments User Context   07/11/2016  3:52 PM 07/15/2016  7:54 PM DNR [id number]  Tawni Millers, MD Inpatient   12/14/2015  2:59 PM 07/11/2016 10:08 AM Partial Code [id number]  Collene Gobble, MD Outpatient   11/18/2015 11:13 AM 11/25/2015  7:31 PM Full Code [id number]  Donita Brooks, NP ED   03/14/2015  5:44 AM 03/16/2015  3:49 PM Full Code [id number]  Ivor Costa, MD Inpatient   11/30/2014 11:38 AM 12/04/2014  3:46 PM Full Code [id number]  Robbie Lis, MD Inpatient   10/19/2013  2:45 PM 10/24/2013  2:36 PM Full Code [id number]  Theodis Blaze, MD ED    Questions for Most Recent Historical Code Status (Order [id number])    Question Answer Comment   In the event of cardiac or respiratory ARREST Do not call a "code blue"    In the event of cardiac or respiratory ARREST Do not perform Intubation, CPR, defibrillation or ACLS    In the event of cardiac or respiratory ARREST Use medication by any route, position, wound care, and other measures to relive pain and suffering. May use oxygen, suction and manual treatment of airway obstruction as needed for comfort.         Advance Directive Documentation   Flowsheet Row Most Recent Value  Type of Advance Directive  Out of facility DNR (pink MOST or yellow form)  Pre-existing out of facility DNR order (yellow form or pink MOST form)  No data  "MOST" Form in Place?  No data       Chief Complaint  Patient presents with  .  Hospitalization Follow-up    HISTORY OF PRESENT ILLNESS:  This is a 75 year old female who has been admitted to [facility] on 08/29/16 from [facility] with COPD exacerbation. She was switched from Zithromax to Levaquin, Dulera was switched to Symbicort and Prednisone tapering dose.   She has been admitted for a short-term rehabilitation.  She was seen in her room today and requested for sponge baths instead of showers due to having "tachycardia whenever shoe showers."   PAST MEDICAL HISTORY:  Past Medical History:  Diagnosis Date  . Activity intolerance   . Anemia of chronic disease   . Blood transfusion without reported diagnosis   . Chronic respiratory failure ([place]) 07/15/2014  . COPD (chronic obstructive pulmonary disease) with emphysema ([place]) 11/05/2012   Arlyce Harman 2001:  FEV1 0.50, ratio 28 Pulmonary rehab 2013   . COPD exacerbation ([place]) 07/2016  . DM type 2 (diabetes mellitus, type 2) ([place])   . Emphysema of lung ([place])   . Hyperlipidemia   . Hypertension   . Osteoporosis   . Ovarian cancer on left ([place])   . SBO (small bowel obstruction) s/p LOA 11/07/2012 11/04/2012     CURRENT MEDICATIONS: Reviewed  Patient's Medications  New Prescriptions   No medications on  file  Previous Medications   ALBUTEROL (PROAIR HFA) 108 (90 BASE) MCG/ACT INHALER    Inhale 2 puffs into the lungs every 6 (six) hours as needed for wheezing or shortness of breath.   AMLODIPINE (NORVASC) 2.5 MG TABLET    Take 2.5 mg by mouth every morning.   ASPIRIN 81 MG TABLET    Take 81 mg by mouth every evening.    ATORVASTATIN (LIPITOR) 80 MG TABLET    Take 80 mg by mouth every evening.    BUDESONIDE-FORMOTEROL (SYMBICORT) 160-4.5 MCG/ACT INHALER    INHALE 2 PUFFS INTO THE LUNGS 2 (TWO) TIMES DAILY.   CALCITONIN, SALMON, (MIACALCIN/FORTICAL) 200 UNIT/ACT NASAL SPRAY    Place 1 spray into alternate nostrils daily. Right nostril once daily.. For 30 days 07/07/16-08/05/16 then re-evaluate with MD   CALCIUM  CARBONATE (CALTRATE 600) 1500 MG TABS    Take 1 tablet by mouth 2 (two) times daily with a meal. Reported on 11/18/2015   IPRATROPIUM-ALBUTEROL (DUONEB) 0.5-2.5 (3) MG/3ML SOLN    Take 3 mLs by nebulization every 6 (six) hours as needed.   LEVOFLOXACIN (LEVAQUIN) 750 MG TABLET    Take 1 tablet (750 mg total) by mouth every other day.   LORATADINE (CLARITIN) 10 MG TABLET    Take 1 tablet (10 mg total) by mouth daily.   LOSARTAN (COZAAR) 100 MG TABLET    Take 100 mg by mouth daily.   LUTEIN 20 MG CAPS    Take 20 mg by mouth daily.    METFORMIN (GLUCOPHAGE) 500 MG TABLET    Take 500 mg by mouth daily with breakfast.   MULTIPLE VITAMIN (MULTIVITAMIN WITH MINERALS) TABS TABLET    Take 1 tablet by mouth daily.   NYSTATIN (MYCOSTATIN) 100000 UNIT/ML SUSPENSION    Take 10 mLs by mouth 4 (four) times daily.   POLYETHYL GLYCOL-PROPYL GLYCOL (SYSTANE) 0.4-0.3 % SOLN    Apply 1 drop to eye 2 (two) times daily as needed (for dryness).    PREDNISONE (DELTASONE) 10 MG TABLET    30 mg daily x 3 days, 20 mg daily x 3 days, 10 mg daily x 3 days   RANITIDINE (ZANTAC) 150 MG TABLET    Take 1 tablet (150 mg total) by mouth 2 (two) times daily.   TIOTROPIUM (SPIRIVA) 18 MCG INHALATION CAPSULE    Place 1 capsule (18 mcg total) into inhaler and inhale daily.   UNABLE TO FIND    Med Name: MedPass 120 mL po TID between meals  Modified Medications   Modified Medication Previous Medication   ALPRAZOLAM (XANAX) 0.25 MG TABLET ALPRAZolam (XANAX) 0.25 MG tablet      Take 1 tablet (0.25 mg total) by mouth at bedtime as needed for sleep.    Take 1 tablet (0.25 mg total) by mouth at bedtime as needed for sleep.  Discontinued Medications   ALBUTEROL (PROVENTIL) (2.5 MG/3ML) 0.083% NEBULIZER SOLUTION    Take 3 mLs (2.5 mg total) by nebulization every 6 (six) hours as needed for wheezing or shortness of breath. Dx: J43.8   FEEDING SUPPLEMENT, ENSURE ENLIVE, (ENSURE ENLIVE) LIQD    Take 237 mLs by mouth 3 (three) times daily between  meals.   OLMESARTAN (BENICAR) 40 MG TABLET    Take 40 mg by mouth daily.     Allergies  Allergen Reactions  . Oxycodone-Acetaminophen Nausea Only and Other (See Comments)    Headaches      REVIEW OF SYSTEMS:  GENERAL: no change in appetite, no  fatigue, no weight changes, no fever, chills or weakness EYES: Denies change in vision, dry eyes, eye pain, itching or discharge EARS: Denies change in hearing, ringing in ears, or earache NOSE: Denies nasal congestion or epistaxis MOUTH and THROAT: Denies oral discomfort, gingival pain or bleeding, pain from teeth or hoarseness   RESPIRATORY: no cough, SOB, DOE, wheezing, hemoptysis CARDIAC: no chest pain, edema or palpitations GI: no abdominal pain, diarrhea, constipation, heart burn, nausea or vomiting GU: Denies dysuria, frequency, hematuria, incontinence, or discharge PSYCHIATRIC: Denies feeling of depression or anxiety. No report of hallucinations, insomnia, paranoia, or agitation    PHYSICAL EXAMINATION  GENERAL APPEARANCE: Well nourished. In no acute distress. Normal body habitus SKIN:  Skin is warm and dry.  HEAD: Normal in size and contour. No evidence of trauma EYES: Lids open and close normally. No blepharitis, entropion or ectropion. PERRL. Conjunctivae are clear and sclerae are white. Lenses are without opacity EARS: Pinnae are normal. Patient hears normal voice tunes of the examiner MOUTH and THROAT: Lips are without lesions. Oral mucosa is moist and without lesions. Tongue is normal in shape, size, and color and without lesions NECK: supple, trachea midline, no neck masses, no thyroid tenderness, no thyromegaly LYMPHATICS: no LAN in the neck, no supraclavicular LAN RESPIRATORY: breathing is even & unlabored, BS CTAB; has O2 @ 3L/min via  continuously CARDIAC: RRR, no murmur,no extra heart sounds, no edema GI: abdomen soft, normal BS, no masses, no tenderness, no hepatomegaly, no splenomegaly EXTREMITIES:  Able to move X  4 extremities PSYCHIATRIC: Alert and oriented X 3. Affect and behavior are appropriate  LABS/RADIOLOGY: Labs reviewed: Basic Metabolic Panel:  Recent Labs  11/23/15 0405 07/11/16 1024 07/12/16 0457  NA 147* 137 139  K 5.2* 4.1 4.2  CL 96* 86* 90*  CO2 42* 43* 42*  GLUCOSE 204* 140* 130*  BUN 25* 16 23*  CREATININE 0.91 0.54 0.63  CALCIUM 9.5 10.2 10.0   Liver Function Tests:  Recent Labs  07/12/16 0457  AST 16  ALT 20  ALKPHOS 45  BILITOT 0.8  PROT 5.0*  ALBUMIN 3.3*   CBC:  Recent Labs  11/18/15 0820  11/23/15 0405 07/11/16 1024 07/12/16 0457  WBC 16.2*  < > 9.5 23.4* 9.0  NEUTROABS 12.0*  --   --   --   --   HGB 10.9*  < > 10.4* 11.0* 10.0*  HCT 36.6  < > 34.9* 36.4 31.2*  MCV 104.6*  < > 103.9* 101.1* 97.2  PLT 216  < > 193 260 203  < > = values in this interval not displayed.  CBG:  Recent Labs  07/14/16 2032 07/15/16 0727 07/15/16 1118  GLUCAP 83 104* 110*      Dg Chest 2 View  Result Date: 07/11/2016 CLINICAL DATA:  Shortness of breath, previous smoker for 30 years. EXAM: CHEST  2 VIEW COMPARISON:  07/06/2016 FINDINGS: Cardiomediastinal silhouette is stable. Mild hyperinflation again noted. No infiltrate or pleural effusion. No pulmonary edema. Osteopenia and mild degenerative changes thoracic spine. Stable compression deformity mid thoracic spine. IMPRESSION: No active disease.  Hyperinflation again noted. Electronically Signed   By: Lahoma Crocker M.D.   On: 07/11/2016 11:30    ASSESSMENT/PLAN:  Physical deconditioning - for rehabilitation, PT and OT, for therapeutic strengthening exercises; fall precaution  COPD exacerbation with chronic CO2 retention - Zithromax was switched to Levaquin, Dulera switched to Symbicort; continue O2 @ 3L/min via ; Prednisone will be tapered to 10 mg daily; palliative consult  with [facility]; ProAir inhaler PRN, Duoneb neb PRN  Anemia of chronic disease - check CBC Lab Results  Component Value Date   HGB 10.0 (L)  07/12/2016    Diabetes Mellitus, type 2 - continue Metformin 500 mg 1 tab daily; check hgbA1c  Hyperlipidemia - continue Atorvastatin 80 mg 1 tab Q evening; check lipid panel  GERD - continue Zantac 150 mg 1 tab PO BID  Anxiety - mood is stable; continue Xanax 0.25 mg 1 tab PO Q HS PRN  Hypertension - continue Norvasc 2.5 mg 1 tab daily, Losartan 100 mg 1 tab daily; check BMP  Protein-calorie malnutrition - albumin 3.3; continue  Med pass 120 ml TID      Goals of care:  Short-term rehabilitation     Durenda Age, NP [place] [phone]

## 2016-07-17 NOTE — Telephone Encounter (Signed)
[facility] #[phone] Fax: [phone] 

## 2016-07-18 ENCOUNTER — Non-Acute Institutional Stay (SKILLED_NURSING_FACILITY): Payer: Medicare Other | Admitting: Internal Medicine

## 2016-07-18 ENCOUNTER — Encounter: Payer: Self-pay | Admitting: Internal Medicine

## 2016-07-18 DIAGNOSIS — R233 Spontaneous ecchymoses: Secondary | ICD-10-CM

## 2016-07-18 DIAGNOSIS — I10 Essential (primary) hypertension: Secondary | ICD-10-CM | POA: Diagnosis not present

## 2016-07-18 DIAGNOSIS — R5381 Other malaise: Secondary | ICD-10-CM

## 2016-07-18 DIAGNOSIS — E785 Hyperlipidemia, unspecified: Secondary | ICD-10-CM

## 2016-07-18 DIAGNOSIS — R238 Other skin changes: Secondary | ICD-10-CM | POA: Diagnosis not present

## 2016-07-18 DIAGNOSIS — F411 Generalized anxiety disorder: Secondary | ICD-10-CM | POA: Diagnosis not present

## 2016-07-18 DIAGNOSIS — R1310 Dysphagia, unspecified: Secondary | ICD-10-CM

## 2016-07-18 DIAGNOSIS — J441 Chronic obstructive pulmonary disease with (acute) exacerbation: Secondary | ICD-10-CM

## 2016-07-18 DIAGNOSIS — E43 Unspecified severe protein-calorie malnutrition: Secondary | ICD-10-CM | POA: Diagnosis not present

## 2016-07-18 DIAGNOSIS — J9612 Chronic respiratory failure with hypercapnia: Secondary | ICD-10-CM

## 2016-07-18 DIAGNOSIS — D638 Anemia in other chronic diseases classified elsewhere: Secondary | ICD-10-CM | POA: Diagnosis not present

## 2016-07-18 DIAGNOSIS — B370 Candidal stomatitis: Secondary | ICD-10-CM | POA: Diagnosis not present

## 2016-07-18 DIAGNOSIS — M8580 Other specified disorders of bone density and structure, unspecified site: Secondary | ICD-10-CM

## 2016-07-18 DIAGNOSIS — E118 Type 2 diabetes mellitus with unspecified complications: Secondary | ICD-10-CM

## 2016-07-18 DIAGNOSIS — K5900 Constipation, unspecified: Secondary | ICD-10-CM

## 2016-07-18 DIAGNOSIS — K219 Gastro-esophageal reflux disease without esophagitis: Secondary | ICD-10-CM | POA: Diagnosis not present

## 2016-07-18 DIAGNOSIS — S22000S Wedge compression fracture of unspecified thoracic vertebra, sequela: Secondary | ICD-10-CM

## 2016-07-18 LAB — LIPID PANEL
Cholesterol: 107 mg/dL (ref 0–200)
HDL: 45 mg/dL (ref 35–70)
LDL Cholesterol: 46 mg/dL
TRIGLYCERIDES: 83 mg/dL (ref 40–160)

## 2016-07-18 LAB — BASIC METABOLIC PANEL
BUN: 29 mg/dL — AB (ref 4–21)
Creatinine: 0.7 mg/dL (ref 0.5–1.1)
GLUCOSE: 102 mg/dL
POTASSIUM: 4.2 mmol/L (ref 3.4–5.3)
SODIUM: 142 mmol/L (ref 137–147)

## 2016-07-18 LAB — CBC AND DIFFERENTIAL
HCT: 34 % — AB (ref 36–46)
Hemoglobin: 10.4 g/dL — AB (ref 12.0–16.0)
NEUTROS ABS: 6 /uL
Platelets: 222 10*3/uL (ref 150–399)
WBC: 10.3 10^3/mL

## 2016-07-18 LAB — HEMOGLOBIN A1C: Hemoglobin A1C: 6.2

## 2016-07-18 NOTE — Progress Notes (Signed)
LOCATION: [place]  PCP: Gennette Pac, MD   Code Status: DNR  Goals of care: Advanced Directive information Advanced Directives 07/17/2016  Does patient have an advance directive? Yes  Type of Advance Directive Out of facility DNR (pink MOST or yellow form)  Does patient want to make changes to advanced directive? No - Patient declined  Copy of advanced directive(s) in chart? Yes  Would patient like information on creating an advanced directive? -  Pre-existing out of facility DNR order (yellow form or pink MOST form) -       Extended Emergency Contact Information Primary Emergency Contact: Stubbs,Jane Address: [address]          [place] [address] Johnnette Litter of [employer] Phone: [phone] Relation: Friend Secondary Emergency Contact: Otilio Connors [place] Phone: [phone] Mobile Phone: [phone] Relation: Friend   Allergies  Allergen Reactions  . Oxycodone-Acetaminophen Nausea Only and Other (See Comments)    Headaches     Chief Complaint  Patient presents with  . New Admit To SNF    New Admission     HPI:  Patient is a 75 y.o. female seen today for short term rehabilitation post hospital admission from 07/11/16-07/15/16 with acute copd exacerbation. She was treated with oxygen, bronchodilators, antibiotics and steroids. She is seen in her room today.   Review of Systems:  Constitutional: Negative for fever, diaphoresis. Feels weak and tired. HENT: Negative for headache, congestion, nasal discharge. Positive for occasional difficulty swallowing.   Eyes: Negative for blurred vision, discharge.  Respiratory: Negative for wheezing. Positive for shortness of breath with minimal exertion.  positive for cough. On o2 2l/min Cardiovascular: Negative for chest pain and leg swelling. Positive for occasional palpitations. Gastrointestinal: Negative for heartburn, nausea, vomiting, abdominal pain. Last bowel movement  was last night. She would like to be on a laxative on need basis. Genitourinary: Negative for dysuria and flank pain.  Musculoskeletal: Negative for back pain, fall in the facility.  Skin: Negative for itching, rash.  Neurological: Negative for dizziness.  Psychiatric/Behavioral: Negative for depression.    Past Medical History:  Diagnosis Date  . Activity intolerance   . Anemia of chronic disease   . Blood transfusion without reported diagnosis   . Chronic respiratory failure ([place]) 07/15/2014  . COPD (chronic obstructive pulmonary disease) with emphysema ([place]) 11/05/2012   Arlyce Harman 2001:  FEV1 0.50, ratio 28 Pulmonary rehab 2013   . COPD exacerbation ([place]) 07/2016  . DM type 2 (diabetes mellitus, type 2) ([place])   . Emphysema of lung ([place])   . Hyperlipidemia   . Hypertension   . Osteoporosis   . 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 SBO (small bowel obstruction) s/p LOA 11/07/2012 11/04/2012   Past Surgical History:  Procedure Laterality Date  . ABDOMINAL HYSTERECTOMY    . LAPAROSCOPIC LYSIS OF ADHESIONS  11/07/2012   Procedure: LAPAROSCOPIC LYSIS OF ADHESIONS;  Surgeon: Ralene Ok, MD;  Location: [facility];  Service: General;  Laterality: N/A;  . LAPAROSCOPY  11/07/2012   Procedure: LAPAROSCOPY DIAGNOSTIC;  Surgeon: Ralene Ok, MD;  Location: [facility];  Service: General;  Laterality: N/A;  . LAPAROTOMY  11/07/2012   Procedure: EXPLORATORY LAPAROTOMY;  Surgeon: Ralene Ok, MD;  Location: [facility];  Service: General;  Laterality: N/A;  . LYSIS OF ADHESION  11/07/2012   Procedure: LYSIS OF ADHESION;  Surgeon: Ralene Ok, MD;  Location: [facility];  Service: General;;   Social History:   reports that  she quit smoking about 23 years ago. Her smoking use included Cigarettes. She has a 30.00 pack-year smoking history. She has never used smokeless tobacco. She reports that she drinks about 0.6 oz of alcohol per week . She reports that she does not use drugs.  Family History  Problem Relation  Age of Onset  . Heart failure Mother   . Heart attack Father   . Heart attack Paternal Grandmother     Medications:   Medication List       Accurate as of 07/18/16 12:21 PM. Always use your most recent med list.          albuterol 108 (90 Base) MCG/ACT inhaler Commonly known as:  PROAIR HFA Inhale 2 puffs into the lungs every 6 (six) hours as needed for wheezing or shortness of breath.   ALPRAZolam 0.25 MG tablet Commonly known as:  XANAX Take 1 tablet (0.25 mg total) by mouth at bedtime as needed for sleep.   amLODipine 2.5 MG tablet Commonly known as:  NORVASC Take 2.5 mg by mouth every morning.   aspirin 81 MG tablet Take 81 mg by mouth every evening.   atorvastatin 80 MG tablet Commonly known as:  LIPITOR Take 80 mg by mouth every evening.   budesonide-formoterol 160-4.5 MCG/ACT inhaler Commonly known as:  SYMBICORT INHALE 2 PUFFS INTO THE LUNGS 2 (TWO) TIMES DAILY.   calcitonin (salmon) 200 UNIT/ACT nasal spray Commonly known as:  MIACALCIN/FORTICAL Place 1 spray into alternate nostrils daily. Right nostril once daily.. For 30 days 07/07/16-08/05/16 then re-evaluate with MD   CALTRATE 600 1500 (600 Ca) MG Tabs tablet Generic drug:  calcium carbonate Take 1 tablet by mouth 2 (two) times daily with a meal. Reported on 11/18/2015   ipratropium-albuterol 0.5-2.5 (3) MG/3ML Soln Commonly known as:  DUONEB Take 3 mLs by nebulization every 6 (six) hours as needed.   levofloxacin 750 MG tablet Commonly known as:  LEVAQUIN Take 1 tablet (750 mg total) by mouth every other day.   loratadine 10 MG tablet Commonly known as:  CLARITIN Take 1 tablet (10 mg total) by mouth daily.   losartan 100 MG tablet Commonly known as:  COZAAR Take 100 mg by mouth daily.   Lutein 20 MG Caps Take 20 mg by mouth daily.   metFORMIN 500 MG tablet Commonly known as:  GLUCOPHAGE Take 500 mg by mouth daily with breakfast.   multivitamin with minerals Tabs tablet Take 1 tablet by  mouth daily.   nystatin 100000 UNIT/ML suspension Commonly known as:  MYCOSTATIN Take 10 mLs by mouth 4 (four) times daily.   predniSONE 10 MG tablet Commonly known as:  DELTASONE 30 mg daily x 3 days, 20 mg daily x 3 days, 10 mg daily x 3 days   ranitidine 150 MG tablet Commonly known as:  ZANTAC Take 1 tablet (150 mg total) by mouth 2 (two) times daily.   SYSTANE 0.4-0.3 % Soln Generic drug:  Polyethyl Glycol-Propyl Glycol Apply 1 drop to eye 2 (two) times daily as needed (for dryness).   tiotropium 18 MCG inhalation capsule Commonly known as:  SPIRIVA Place 1 capsule (18 mcg total) into inhaler and inhale daily.   UNABLE TO FIND Med Name: MedPass 120 mL po TID between meals       Immunizations: Immunization History  Administered Date(s) Administered  . H1N1 11/02/2008  . Influenza Split 06/26/2014  . Influenza Whole 08/05/2008, 07/07/2009, 07/26/2010, 07/08/2012  . Influenza,inj,Quad PF,36+ Mos 07/07/2013, 07/21/2015  . Pneumococcal Conjugate-13  11/06/2004, 11/06/2013     Physical Exam:  Vitals:   07/18/16 1216  BP: (!) 110/53  Pulse: 85  Resp: 16  Temp: (!) 96.6 F (35.9 C)  TempSrc: Oral  SpO2: 98%  Weight: 75 lb 12.8 oz (34.4 kg)  Height: 5\' 1"  (1.549 m)   Body mass index is 14.32 kg/m.  General- elderly female, frail, thin built, in no acute distress Head- normocephalic, atraumatic Nose- no nasal discharge Throat- moist mucus membrane  Eyes- PERRLA, EOMI, no pallor, no icterus, no discharge, normal conjunctiva, normal sclera Neck- no cervical lymphadenopathy Cardiovascular- normal s1,s2, no murmur, no leg edema Respiratory- bilateral decreased air movement, no wheeze, no rhonchi, no crackles, short of breath in between sentences, on 2 l o2 by nasal canula Abdomen- bowel sounds present, soft, non tender Musculoskeletal- able to move all 4 extremities, generalized weakness, kyphosis present Neurological- alert and oriented to person, place and  time Skin- warm and dry, easy bruising to arms and legs with ecchymoses present Psychiatry- somewhat anxious   Labs reviewed: Basic Metabolic Panel:  Recent Labs  11/23/15 0405 07/11/16 1024 07/12/16 0457  NA 147* 137 139  K 5.2* 4.1 4.2  CL 96* 86* 90*  CO2 42* 43* 42*  GLUCOSE 204* 140* 130*  BUN 25* 16 23*  CREATININE 0.91 0.54 0.63  CALCIUM 9.5 10.2 10.0   Liver Function Tests:  Recent Labs  07/12/16 0457  AST 16  ALT 20  ALKPHOS 45  BILITOT 0.8  PROT 5.0*  ALBUMIN 3.3*   No results for input(s): LIPASE, AMYLASE in the last 8760 hours. No results for input(s): AMMONIA in the last 8760 hours. CBC:  Recent Labs  11/18/15 0820  11/23/15 0405 07/11/16 1024 07/12/16 0457  WBC 16.2*  < > 9.5 23.4* 9.0  NEUTROABS 12.0*  --   --   --   --   HGB 10.9*  < > 10.4* 11.0* 10.0*  HCT 36.6  < > 34.9* 36.4 31.2*  MCV 104.6*  < > 103.9* 101.1* 97.2  PLT 216  < > 193 260 203  < > = values in this interval not displayed. Cardiac Enzymes: No results for input(s): CKTOTAL, CKMB, CKMBINDEX, TROPONINI in the last 8760 hours. BNP: Invalid input(s): POCBNP CBG:  Recent Labs  07/14/16 2032 07/15/16 0727 07/15/16 1118  GLUCAP 83 104* 110*    Radiological Exams: Dg Chest 2 View  Result Date: 07/11/2016 CLINICAL DATA:  Shortness of breath, previous smoker for 30 years. EXAM: CHEST  2 VIEW COMPARISON:  07/06/2016 FINDINGS: Cardiomediastinal silhouette is stable. Mild hyperinflation again noted. No infiltrate or pleural effusion. No pulmonary edema. Osteopenia and mild degenerative changes thoracic spine. Stable compression deformity mid thoracic spine. IMPRESSION: No active disease.  Hyperinflation again noted. Electronically Signed   By: Lahoma Crocker M.D.   On: 07/11/2016 11:30    Assessment/Plan  Physical deconditioning Will have her work with physical therapy and occupational therapy team to help with gait training and muscle strengthening exercises.fall precautions.  Skin care. Encourage to be out of bed.   Copd exacerbation Continue oxygen 2l/min by nasal canula. Continue and complete course of levaquin 750 mg every other day on 07/24/16. Continue slow taper of prednisone to 15 mg daily and continue chronic maintenance dosing. Continue her bronchodilators. Reviewed pulmonology note from hospital.   Severe protein calorie malnutrition RD to evaluate. Continue medpass supplement. Monitor weekly weight for now  Chronic respiratory failure Continue o2 and breathing treatment, monitor clinically. To be on  chronic prednisone.   Anemia of chronic disease Monitor cbc  Anxiety Continue xanax 0.25 mg qhs as needed, keep her room door open all the time per patient request  Easy bruising With her on chronic prednisone, continue skin care  Hypertension bp on lower side of normal. D/c norvasc. Continue losartan 100 mg daily with bp check bid for now. Check bmp  Oral thrush prevention Provides hx of recurrent oral thrush. Continue nystatin swish and swallow and to provide oral care, monitor clinically  Dm type 2 Monitor cbg. Continue metformin 500 mg daily  Constipation Start miralax 17 g daily as needed and monitor, hydration to be maintained  Dysphagia Get SLP consult  gerd Stable, continue zantac 150 mg bid  HLD Continue her statin, no changes made  Thoracic compression deformity No focal pain. Monitor clinically  Osteopenia  noted on xray. Start calcium and vitamin d supplement. High risk for fracture with her on chronic steroids   Goals of care: short term rehabilitation   Labs/tests ordered: cbc, cmp  Family/ staff Communication: reviewed care plan with patient and nursing supervisor    Blanchie Serve, MD Internal Medicine [place], [place] [address] Cell Phone (Monday-Friday 8 am - 5 pm): [phone] On Call: [phone] and follow prompts after 5 pm and on  weekends Office Phone: [phone] Office Fax: [phone]

## 2016-07-26 ENCOUNTER — Encounter: Payer: Self-pay | Admitting: Adult Health

## 2016-07-26 ENCOUNTER — Non-Acute Institutional Stay (SKILLED_NURSING_FACILITY): Payer: Medicare Other | Admitting: Adult Health

## 2016-07-26 DIAGNOSIS — D638 Anemia in other chronic diseases classified elsewhere: Secondary | ICD-10-CM

## 2016-07-26 DIAGNOSIS — J441 Chronic obstructive pulmonary disease with (acute) exacerbation: Secondary | ICD-10-CM | POA: Diagnosis not present

## 2016-07-26 DIAGNOSIS — R5381 Other malaise: Secondary | ICD-10-CM | POA: Diagnosis not present

## 2016-07-26 DIAGNOSIS — E43 Unspecified severe protein-calorie malnutrition: Secondary | ICD-10-CM | POA: Diagnosis not present

## 2016-07-26 DIAGNOSIS — E118 Type 2 diabetes mellitus with unspecified complications: Secondary | ICD-10-CM | POA: Diagnosis not present

## 2016-07-26 DIAGNOSIS — I10 Essential (primary) hypertension: Secondary | ICD-10-CM | POA: Diagnosis not present

## 2016-07-26 DIAGNOSIS — K219 Gastro-esophageal reflux disease without esophagitis: Secondary | ICD-10-CM | POA: Diagnosis not present

## 2016-07-26 DIAGNOSIS — K5900 Constipation, unspecified: Secondary | ICD-10-CM

## 2016-07-26 DIAGNOSIS — J9612 Chronic respiratory failure with hypercapnia: Secondary | ICD-10-CM | POA: Diagnosis not present

## 2016-07-26 DIAGNOSIS — E785 Hyperlipidemia, unspecified: Secondary | ICD-10-CM

## 2016-07-26 DIAGNOSIS — F419 Anxiety disorder, unspecified: Secondary | ICD-10-CM

## 2016-07-26 NOTE — Progress Notes (Signed)
Patient ID: [name], female   DOB: 23-Jun-1941, 75 y.o.   MRN: [id number]    DATE:  07/26/2016   MRN:  [id number]  BIRTHDAY: 19-Aug-1941  Facility:  Nursing Home Location:  [facility] Room Number: 1206-P  LEVEL OF CARE:  SNF (31)  Contact Information    Name Relation Home Work Mobile   [name] [phone]  [phone]   [name] [phone]  [phone]   Tacey Heap   [id number]       Code Status History    Date Active Date Inactive Code Status Order ID Comments User Context   07/11/2016  3:52 PM 07/15/2016  7:54 PM DNR [id number]  Tawni Millers, MD Inpatient   12/14/2015  2:59 PM 07/11/2016 10:08 AM Partial Code [id number]  Collene Gobble, MD Outpatient   11/18/2015 11:13 AM 11/25/2015  7:31 PM Full Code [id number]  Donita Brooks, NP ED   03/14/2015  5:44 AM 03/16/2015  3:49 PM Full Code [id number]  Ivor Costa, MD Inpatient   11/30/2014 11:38 AM 12/04/2014  3:46 PM Full Code [id number]  Robbie Lis, MD Inpatient   10/19/2013  2:45 PM 10/24/2013  2:36 PM Full Code [id number]  Theodis Blaze, MD ED    Questions for Most Recent Historical Code Status (Order [id number])    Question Answer Comment   In the event of cardiac or respiratory ARREST Do not call a "code blue"    In the event of cardiac or respiratory ARREST Do not perform Intubation, CPR, defibrillation or ACLS    In the event of cardiac or respiratory ARREST Use medication by any route, position, wound care, and other measures to relive pain and suffering. May use oxygen, suction and manual treatment of airway obstruction as needed for comfort.         Advance Directive Documentation   Flowsheet Row Most Recent Value  Type of Advance Directive  Out of facility DNR (pink MOST or yellow form)  Pre-existing out of facility DNR order (yellow form or pink MOST form)  No data  "MOST" Form in Place?  No data       Chief Complaint  Patient presents with  .  Discharge Note    HISTORY OF PRESENT ILLNESS:  This is a 75 year old female who is for discharge to [facility] She has a friend that lives close there and wanted to be closer to her.  She has been admitted to [facility] on 08/29/16 from [facility] with COPD exacerbation. She was switched from Zithromax to Levaquin, Dulera was switched to Symbicort and Prednisone .  PAST MEDICAL HISTORY:  Past Medical History:  Diagnosis Date  . Activity intolerance   . Anemia of chronic disease   . Blood transfusion without reported diagnosis   . Chronic respiratory failure ([place]) 07/15/2014  . COPD (chronic obstructive pulmonary disease) with emphysema ([place]) 11/05/2012   Arlyce Harman 2001:  FEV1 0.50, ratio 28 Pulmonary rehab 2013   . COPD exacerbation ([place]) 07/2016  . DM type 2 (diabetes mellitus, type 2) ([place])   . Emphysema of lung ([place])   . Hyperlipidemia   . Hypertension   . Osteoporosis   . Ovarian cancer on left ([place])   . SBO (small bowel obstruction) s/p LOA 11/07/2012 11/04/2012     CURRENT MEDICATIONS: Reviewed  Patient's Medications  New Prescriptions   No medications on file  Previous Medications   ALBUTEROL (PROAIR HFA) 108 (90 BASE)  MCG/ACT INHALER    Inhale 2 puffs into the lungs every 6 (six) hours as needed for wheezing or shortness of breath.   ALPRAZOLAM (XANAX) 0.25 MG TABLET    Take 1 tablet (0.25 mg total) by mouth at bedtime as needed for sleep.   AMLODIPINE (NORVASC) 2.5 MG TABLET    Take 2.5 mg by mouth every morning.    ASPIRIN 81 MG TABLET    Take 81 mg by mouth every evening.    ATORVASTATIN (LIPITOR) 80 MG TABLET    Take 80 mg by mouth every evening.    BUDESONIDE-FORMOTEROL (SYMBICORT) 160-4.5 MCG/ACT INHALER    INHALE 2 PUFFS INTO THE LUNGS 2 (TWO) TIMES DAILY.   CALCITONIN, SALMON, (MIACALCIN/FORTICAL) 200 UNIT/ACT NASAL SPRAY    Place 1 spray into alternate nostrils daily. Right nostril once daily.. For 30 days 07/07/16-08/05/16 then re-evaluate with MD    CALCIUM CITRATE-VITAMIN D 500-400 MG-UNIT CHEWABLE TABLET    Chew 1 tablet by mouth 2 (two) times daily.   IPRATROPIUM-ALBUTEROL (DUONEB) 0.5-2.5 (3) MG/3ML SOLN    Take 3 mLs by nebulization every 6 (six) hours as needed.   LORATADINE (CLARITIN) 10 MG TABLET    Take 1 tablet (10 mg total) by mouth daily.   LOSARTAN (COZAAR) 100 MG TABLET    Take 100 mg by mouth daily.   LUTEIN 20 MG CAPS    Take 20 mg by mouth daily.    METFORMIN (GLUCOPHAGE) 500 MG TABLET    Take 500 mg by mouth daily with breakfast.   MULTIPLE VITAMIN (MULTIVITAMIN WITH MINERALS) TABS TABLET    Take 1 tablet by mouth daily.   NYSTATIN (MYCOSTATIN) 100000 UNIT/ML SUSPENSION    Take 10 mLs by mouth 4 (four) times daily.   POLYETHYL GLYCOL-PROPYL GLYCOL (SYSTANE) 0.4-0.3 % SOLN    Apply 1 drop to eye 2 (two) times daily as needed (for dryness).    POLYETHYLENE GLYCOL (MIRALAX / GLYCOLAX) PACKET    Take 17 g by mouth as needed for mild constipation.   PREDNISONE (DELTASONE) 10 MG TABLET    Take 15 mg by mouth daily with breakfast. Give 1-1/2 tablets for chronic maintenance   RANITIDINE (ZANTAC) 150 MG TABLET    Take 1 tablet (150 mg total) by mouth 2 (two) times daily.   TIOTROPIUM (SPIRIVA) 18 MCG INHALATION CAPSULE    Place 1 capsule (18 mcg total) into inhaler and inhale daily.   UNABLE TO FIND    Med Name: MedPass 120 mL po TID between meals  Modified Medications   No medications on file  Discontinued Medications   CALCIUM CARBONATE (CALTRATE 600) 1500 MG TABS    Take 1 tablet by mouth 2 (two) times daily with a meal. Reported on 11/18/2015   LEVOFLOXACIN (LEVAQUIN) 750 MG TABLET    Take 1 tablet (750 mg total) by mouth every other day.   PREDNISONE (DELTASONE) 10 MG TABLET    30 mg daily x 3 days, 20 mg daily x 3 days, 10 mg daily x 3 days     Allergies  Allergen Reactions  . Oxycodone-Acetaminophen Nausea Only and Other (See Comments)    Headaches      REVIEW OF SYSTEMS:  GENERAL: no change in appetite, no  fatigue, no weight changes, no fever, chills or weakness EYES: Denies change in vision, dry eyes, eye pain, itching or discharge EARS: Denies change in hearing, ringing in ears, or earache NOSE: Denies nasal congestion or epistaxis MOUTH and THROAT: Denies oral  discomfort, gingival pain or bleeding, pain from teeth or hoarseness   RESPIRATORY: no cough, SOB, DOE, wheezing, hemoptysis CARDIAC: no chest pain, edema or palpitations GI: no abdominal pain, diarrhea, constipation, heart burn, nausea or vomiting GU: Denies dysuria, frequency, hematuria, incontinence, or discharge PSYCHIATRIC: Denies feeling of depression or anxiety. No report of hallucinations, insomnia, paranoia, or agitation    PHYSICAL EXAMINATION  GENERAL APPEARANCE: Well nourished. In no acute distress. Normal body habitus SKIN:  Skin is warm and dry.  HEAD: Normal in size and contour. No evidence of trauma EYES: Lids open and close normally. No blepharitis, entropion or ectropion. PERRL. Conjunctivae are clear and sclerae are white. Lenses are without opacity EARS: Pinnae are normal. Patient hears normal voice tunes of the examiner MOUTH and THROAT: Lips are without lesions. Oral mucosa is moist and without lesions. Tongue is normal in shape, size, and color and without lesions NECK: supple, trachea midline, no neck masses, no thyroid tenderness, no thyromegaly LYMPHATICS: no LAN in the neck, no supraclavicular LAN RESPIRATORY: breathing is even & unlabored, BS decreased; has O2 @ 2L/min via [place] continuously CARDIAC: RRR, no murmur,no extra heart sounds, no edema GI: abdomen soft, normal BS, no masses, no tenderness, no hepatomegaly, no splenomegaly EXTREMITIES:  Able to move X 4 extremities PSYCHIATRIC: Alert and oriented X 3. Affect and behavior are appropriate  LABS/RADIOLOGY: Labs reviewed: Basic Metabolic Panel:  Recent Labs  11/23/15 0405 07/11/16 1024 07/12/16 0457 07/18/16  NA 147* 137 139 142  K 5.2* 4.1  4.2 4.2  CL 96* 86* 90*  --   CO2 42* 43* 42*  --   GLUCOSE 204* 140* 130*  --   BUN 25* 16 23* 29*  CREATININE 0.91 0.54 0.63 0.7  CALCIUM 9.5 10.2 10.0  --    Liver Function Tests:  Recent Labs  07/12/16 0457  AST 16  ALT 20  ALKPHOS 45  BILITOT 0.8  PROT 5.0*  ALBUMIN 3.3*   CBC:  Recent Labs  11/18/15 0820  11/23/15 0405 07/11/16 1024 07/12/16 0457 07/18/16  WBC 16.2*  < > 9.5 23.4* 9.0 10.3  NEUTROABS 12.0*  --   --   --   --  6  HGB 10.9*  < > 10.4* 11.0* 10.0* 10.4*  HCT 36.6  < > 34.9* 36.4 31.2* 34*  MCV 104.6*  < > 103.9* 101.1* 97.2  --   PLT 216  < > 193 260 203 222  < > = values in this interval not displayed.  CBG:  Recent Labs  07/14/16 2032 07/15/16 0727 07/15/16 1118  GLUCAP 83 104* 110*      Dg Chest 2 View  Result Date: 07/11/2016 CLINICAL DATA:  Shortness of breath, previous smoker for 30 years. EXAM: CHEST  2 VIEW COMPARISON:  07/06/2016 FINDINGS: Cardiomediastinal silhouette is stable. Mild hyperinflation again noted. No infiltrate or pleural effusion. No pulmonary edema. Osteopenia and mild degenerative changes thoracic spine. Stable compression deformity mid thoracic spine. IMPRESSION: No active disease.  Hyperinflation again noted. Electronically Signed   By: Lahoma Crocker M.D.   On: 07/11/2016 11:30    ASSESSMENT/PLAN:  Physical deconditioning - for transfer to [facility] SNF per patient's request so she can be closre to friend; fall precaution  COPD exacerbation with chronic CO2 retention - Zithromax was switched to Levaquin, Dulera switched to Symbicort; continue O2 @ 2L/min via [place]; Prednisone 10 mg take 1 1/2 tab = 15 mg daily; palliative consult with [facility]; ProAir inhaler PRN, Duoneb neb  PRN  Chronic respiratory failure - continue O2 @ 2-3L/min via [place] continuously and breathing treatments  Anemia of chronic disease - stable Lab Results  Component Value Date   HGB 10.4 (A) 07/18/2016    Diabetes Mellitus, type 2 - continue  Metformin 500 mg 1 tab daily Lab Results  Component Value Date   HGBA1C 6.2 07/18/2016   Hyperlipidemia - continue Atorvastatin 80 mg 1 tab Q evening Lab Results  Component Value Date   CHOL 107 07/18/2016   HDL 45 07/18/2016   LDLCALC 46 07/18/2016   TRIG 83 07/18/2016    GERD - continue Zantac 150 mg 1 tab PO BID  Anxiety - mood is stable; continue Xanax 0.25 mg 1 tab PO Q HS PRN  Hypertension - continue Norvasc 2.5 mg 1 tab daily, Losartan 100 mg 1 tab daily; check BMP  Protein-calorie malnutrition - albumin 3.3; continue  Med pass 120 ml TID  Constipation - continue Miralax 17 gm PO Q D PRN      I have filled out patient's discharge paperwork and written prescriptions.    DME provided:  None  Total discharge time: Greater than 30 minutes Greater than 50% was spent in counseling and coordination of care with the patient.  Discharge time involved coordination of the discharge process with social worker, nursing staff and therapy department.     Durenda Age, NP [employer] [phone]

## 2016-08-24 ENCOUNTER — Ambulatory Visit: Payer: Medicare Other | Admitting: Emergency Medicine

## 2016-08-25 ENCOUNTER — Encounter: Payer: Self-pay | Admitting: Emergency Medicine

## 2016-08-25 ENCOUNTER — Ambulatory Visit (INDEPENDENT_AMBULATORY_CARE_PROVIDER_SITE_OTHER): Payer: Medicare Other | Admitting: Emergency Medicine

## 2016-08-25 VITALS — BP 132/86 | HR 101 | Wt 74.0 lb

## 2016-08-25 DIAGNOSIS — J438 Other emphysema: Secondary | ICD-10-CM

## 2016-08-25 NOTE — Patient Instructions (Signed)
Continue your prednisone 15mg  daily Continue Spiriva and Symbicort.  Continue your oxygen at 3L/min.  Please use your DuoNeb up to every 6 hours if needed for shortness of breath We will consult [facility] and Palliative Care to help [place] manage your breathing symptoms.  Follow with Dr Lamonte Sakai in 2 months or sooner if you have any problems.

## 2016-08-25 NOTE — Addendum Note (Signed)
Addended by: Desmond Dike C on: 08/25/2016 03:21 PM   Modules accepted: Orders

## 2016-08-25 NOTE — Progress Notes (Signed)
Subjective:                      Patient ID: [name], female    DOB: 1940-12-30, 75 y.o.   MRN: [id number]  HPI 75 year-old former smoker (30 pack years) with a history of hypertension, ovarian cancer, and COPD with chronic hypoxemic respiratory failure that has be followed by Dr Gwenette Greet.  I have personally reviewed her spirometry from 06/14/2000 that showed severe obstruction with FEV1 of 0.5 L or 23% predicted.  I have reviewed her hospital notes and see that she was just in the emergency department on 07/26/15 > some L-mid substernal pain, had a reassuring eval, no evidence for MI. She is on Spiriva + Symbicort, pred 10mg , O2 at 2L/min at all times. Her last exacerbation was on 03/2015.    ROV 05/02/16 -- patient has a history of severe COPD with associated chronic hypoxemic respiratory failure and chronic prednisone, increased to 15mg  daily at her last visit. She uses oxygen at 3L/min. Last week she had an increase in her SOB w exertion, no real cough, no mucous. She is having more sinus drainage, clear mucous. She was having sinus pressure. She was started on a pred taper and azithro last week. She is feeling better > still not at her baseline. She is having some anxiety that is feeding into the dyspnea.  She is still concerned that she should not have placed DNR orders in the chart. She might want it reversed. When pressed she wants to wait to discuss further on July 18 at our [place]. For now I will leave her partial code  ROV 08/25/16 -- Follow-up visit for patient with severe COPD, chronic hypoxemic respiratory failure on 3 L/m. She is on chronic prednisone 15 mg daily. She was admitted with an acute flare September, was hospitalized. Was d/c to SNF for rehab. They have spoken with social worker at [place] regarding getting some [place].  She is on symbicort + spiriva, has duoneb prn. proAir prn. She has a hoarse voice. Minimal cough. No real chest congestion at this time.  Some dysphagia.    Review of Systems  As per history of present illness     Objective:   Physical Exam Vitals:   08/25/16 1357  BP: 132/86  Pulse: (!) 101  SpO2: 97%  Weight: 74 lb (33.6 kg)   Gen: Pleasant, thin, in a wheelchair, in no distress,  normal affect  ENT: No lesions,  mouth clear,  oropharynx clear, no postnasal drip  Neck: No JVD, some gravel voice, no stridor  Lungs: No use of accessory muscles, clear without rales or rhonchi, no wheeze  Cardiovascular: RRR, heart sounds normal, no murmur or gallops, no peripheral edema  Musculoskeletal: No deformities, no cyanosis or clubbing  Neuro: alert, non focal  Skin: Warm, no lesions or rashes      Assessment & Plan:  COPD (chronic obstructive pulmonary disease) with emphysema (HCC) Severe disease with symptoms even at rest. I believe she would be appropriate for hospice care. She and her family agree. I will make the referral today.  Continue your prednisone 15mg  daily Continue Spiriva and Symbicort.  Continue your oxygen at 3L/min.  Please use your DuoNeb up to every 6 hours if needed for shortness of breath We will consult [facility] and Palliative Care to help [place] manage your breathing symptoms.  Follow with Dr Lamonte Sakai in 2 months or sooner if you have any  problems.    Baltazar Apo, MD, PhD 08/25/2016, 2:27 PM [facility] [phone] or if no answer [phone]

## 2016-08-25 NOTE — Assessment & Plan Note (Signed)
Severe disease with symptoms even at rest. I believe she would be appropriate for hospice care. She and her family agree. I will make the referral today.  Continue your prednisone 15mg  daily Continue Spiriva and Symbicort.  Continue your oxygen at 3L/min.  Please use your DuoNeb up to every 6 hours if needed for shortness of breath We will consult [facility] and Palliative Care to help [place] manage your breathing symptoms.  Follow with Dr Lamonte Sakai in 2 months or sooner if you have any problems.

## 2016-10-13 ENCOUNTER — Telehealth: Payer: Self-pay | Admitting: Emergency Medicine

## 2016-10-16 NOTE — Telephone Encounter (Signed)
Thank you for letting me know

## 2016-10-24 ENCOUNTER — Ambulatory Visit: Payer: Medicare Other | Admitting: Emergency Medicine

## 2016-11-06 NOTE — Telephone Encounter (Signed)
Will forward to RB to make him aware that pt passed away today.

## 2016-11-06 DEATH — deceased
